# Patient Record
Sex: Male | Born: 1935
Health system: Southern US, Community
[De-identification: ages and names within clinical notes are randomized; demographics above are authoritative.]

## PROBLEM LIST (undated history)

## (undated) DIAGNOSIS — R739 Hyperglycemia, unspecified: Secondary | ICD-10-CM

## (undated) DIAGNOSIS — I10 Essential (primary) hypertension: Secondary | ICD-10-CM

## (undated) DIAGNOSIS — C169 Malignant neoplasm of stomach, unspecified: Secondary | ICD-10-CM

## (undated) HISTORY — DX: Essential (primary) hypertension: I10

## (undated) HISTORY — DX: Malignant neoplasm of stomach, unspecified: C16.9

## (undated) HISTORY — DX: Hyperglycemia, unspecified: R73.9

---

## 2011-12-21 ENCOUNTER — Ambulatory Visit (INDEPENDENT_AMBULATORY_CARE_PROVIDER_SITE_OTHER): Payer: Medicare Other | Admitting: Internal Medicine

## 2011-12-21 ENCOUNTER — Encounter: Payer: Self-pay | Admitting: Internal Medicine

## 2011-12-21 DIAGNOSIS — E785 Hyperlipidemia, unspecified: Secondary | ICD-10-CM

## 2011-12-21 DIAGNOSIS — I10 Essential (primary) hypertension: Secondary | ICD-10-CM

## 2011-12-21 DIAGNOSIS — R011 Cardiac murmur, unspecified: Secondary | ICD-10-CM | POA: Insufficient documentation

## 2011-12-21 DIAGNOSIS — Z23 Encounter for immunization: Secondary | ICD-10-CM

## 2011-12-21 DIAGNOSIS — I499 Cardiac arrhythmia, unspecified: Secondary | ICD-10-CM

## 2011-12-21 DIAGNOSIS — Z79899 Other long term (current) drug therapy: Secondary | ICD-10-CM

## 2011-12-21 HISTORY — PX: NO PAST SURGERIES: SHX2092

## 2011-12-21 LAB — LIPID PANEL
HDL: 37 mg/dL — ABNORMAL LOW (ref >39)
LDL Cholesterol: 129 mg/dL — ABNORMAL HIGH (ref 0–99)
Total CHOL/HDL Ratio: 5 Ratio
Triglycerides: 100 mg/dL (ref <150)

## 2011-12-21 LAB — BASIC METABOLIC PANEL
CO2: 25 mEq/L (ref 19–32)
Chloride: 104 mEq/L (ref 96–112)
Creat: 1.11 mg/dL (ref 0.50–1.35)
Sodium: 140 mEq/L (ref 135–145)

## 2011-12-21 LAB — CBC
Platelets: 218 10*3/uL (ref 150–400)
RBC: 4.24 MIL/uL (ref 4.22–5.81)
RDW: 13.5 % (ref 11.5–15.5)
WBC: 5.4 10*3/uL (ref 4.0–10.5)

## 2011-12-21 LAB — HEPATIC FUNCTION PANEL
AST: 18 U/L (ref 0–37)
Albumin: 4.1 g/dL (ref 3.5–5.2)
Alkaline Phosphatase: 77 U/L (ref 39–117)
Total Bilirubin: 0.5 mg/dL (ref 0.3–1.2)
Total Protein: 6.8 g/dL (ref 6.0–8.3)

## 2011-12-21 NOTE — Progress Notes (Signed)
  Subjective:    Patient ID: Lee Pratt, male    DOB: 10-Jul-1934, 77 y.o.   MRN: [id number]  HPI Pt presents to clinic to establish care and for follow up of multiple medical problems. Notes h/o HTN well controlled with diltiazem without bradycardia and ace inhibitor without chronic cough or angioedema. Has been told in the past has mild hyperlipidemia however has not required medication. Recalls reportedly nl colonoscopy ~?2011 in [place]. Shows me a handwritten note from a local physician suggesting evaluation for "arrythmia." denies palpitations, chest pain or known h/o arrythmia. Denies having had recent EKG. Has minimal intermittent cough/throat irritation for the last 1-2 wks. Does not feel sick. Feels it is more related to air from airvent blowing against his face. No other complaints.  Past Medical History  Diagnosis Date  . Hypertension    Past Surgical History  Procedure Date  . No past surgeries 12/21/2011    reports that he has never smoked. He has never used smokeless tobacco. He reports that he does not drink alcohol or use illicit drugs. family history is not on file. No Known Allergies   Review of Systems  Constitutional: Negative for fever.  HENT: Positive for congestion.   Respiratory: Positive for cough.   Cardiovascular: Negative for chest pain and palpitations.  Neurological: Negative for dizziness, syncope and light-headedness.  All other systems reviewed and are negative.       Objective:   Physical Exam  Nursing note and vitals reviewed. Constitutional: He is oriented to person, place, and time. He appears well-developed and well-nourished. No distress.  HENT:  Head: Normocephalic and atraumatic.  Right Ear: Tympanic membrane, external ear and ear canal normal.  Left Ear: Tympanic membrane, external ear and ear canal normal.  Nose: Nose normal.  Mouth/Throat: Oropharynx is clear and moist. No oropharyngeal exudate.  Eyes: Conjunctivae are normal. Pupils are  equal, round, and reactive to light. Right eye exhibits no discharge. Left eye exhibits no discharge. No scleral icterus.  Neck: Neck supple. No JVD present. Carotid bruit is not present. No thyromegaly present.  Cardiovascular: Normal rate and regular rhythm.  Exam reveals no gallop and no friction rub.   Murmur heard.  Systolic murmur is present with a grade of 3/6  Pulmonary/Chest: Effort normal and breath sounds normal. No respiratory distress. He has no wheezes. He has no rales.  Neurological: He is alert and oriented to person, place, and time.  Skin: Skin is warm and dry. He is not diaphoretic.  Psychiatric: He has a normal mood and affect.          Assessment & Plan:

## 2011-12-21 NOTE — Assessment & Plan Note (Signed)
Asx. EKG obtained demonstrated SR ~69 with interspersed PVC's. No VT. Echocardiogram as above

## 2011-12-21 NOTE — Assessment & Plan Note (Signed)
Asx. Schedule echocardiogram

## 2011-12-21 NOTE — Patient Instructions (Signed)
Please schedule echocardiogram to evaluate your heart

## 2011-12-21 NOTE — Assessment & Plan Note (Signed)
Reportedly mild. Obtain lipid/lft

## 2011-12-27 ENCOUNTER — Ambulatory Visit (HOSPITAL_COMMUNITY): Payer: Medicare Other | Attending: Internal Medicine

## 2011-12-27 DIAGNOSIS — I10 Essential (primary) hypertension: Secondary | ICD-10-CM | POA: Insufficient documentation

## 2011-12-27 DIAGNOSIS — I379 Nonrheumatic pulmonary valve disorder, unspecified: Secondary | ICD-10-CM | POA: Insufficient documentation

## 2011-12-27 DIAGNOSIS — I359 Nonrheumatic aortic valve disorder, unspecified: Secondary | ICD-10-CM | POA: Insufficient documentation

## 2011-12-27 DIAGNOSIS — E785 Hyperlipidemia, unspecified: Secondary | ICD-10-CM | POA: Insufficient documentation

## 2011-12-27 DIAGNOSIS — I059 Rheumatic mitral valve disease, unspecified: Secondary | ICD-10-CM | POA: Insufficient documentation

## 2011-12-27 DIAGNOSIS — R011 Cardiac murmur, unspecified: Secondary | ICD-10-CM | POA: Insufficient documentation

## 2011-12-27 DIAGNOSIS — I079 Rheumatic tricuspid valve disease, unspecified: Secondary | ICD-10-CM | POA: Insufficient documentation

## 2012-05-19 ENCOUNTER — Ambulatory Visit (HOSPITAL_BASED_OUTPATIENT_CLINIC_OR_DEPARTMENT_OTHER)
Admission: RE | Admit: 2012-05-19 | Discharge: 2012-05-19 | Disposition: A | Discharge status: Home / Self Care | Payer: Medicare Other | Source: Ambulatory Visit | Attending: Internal Medicine | Admitting: Internal Medicine

## 2012-05-19 ENCOUNTER — Ambulatory Visit (INDEPENDENT_AMBULATORY_CARE_PROVIDER_SITE_OTHER): Payer: Medicare Other | Admitting: Internal Medicine

## 2012-05-19 ENCOUNTER — Encounter: Payer: Self-pay | Admitting: Internal Medicine

## 2012-05-19 VITALS — BP 118/70 | HR 64 | Temp 97.6°F | Resp 18 | Ht 69.0 in | Wt 140.0 lb

## 2012-05-19 DIAGNOSIS — I10 Essential (primary) hypertension: Secondary | ICD-10-CM

## 2012-05-19 DIAGNOSIS — R05 Cough: Secondary | ICD-10-CM | POA: Insufficient documentation

## 2012-05-19 DIAGNOSIS — E785 Hyperlipidemia, unspecified: Secondary | ICD-10-CM

## 2012-05-19 DIAGNOSIS — Z79899 Other long term (current) drug therapy: Secondary | ICD-10-CM

## 2012-05-19 DIAGNOSIS — J984 Other disorders of lung: Secondary | ICD-10-CM | POA: Insufficient documentation

## 2012-05-19 DIAGNOSIS — J8410 Pulmonary fibrosis, unspecified: Secondary | ICD-10-CM | POA: Insufficient documentation

## 2012-05-19 LAB — HEPATIC FUNCTION PANEL
ALT: 17 U/L (ref 0–53)
Albumin: 4.1 g/dL (ref 3.5–5.2)
Alkaline Phosphatase: 77 U/L (ref 39–117)
Total Protein: 7.1 g/dL (ref 6.0–8.3)

## 2012-05-19 LAB — LIPID PANEL
Cholesterol: 188 mg/dL (ref 0–200)
HDL: 41 mg/dL (ref >39)
LDL Cholesterol: 133 mg/dL — ABNORMAL HIGH (ref 0–99)
Triglycerides: 71 mg/dL (ref <150)
VLDL: 14 mg/dL (ref 0–40)

## 2012-05-19 LAB — BASIC METABOLIC PANEL
Chloride: 105 mEq/L (ref 96–112)
Potassium: 3.9 mEq/L (ref 3.5–5.3)
Sodium: 140 mEq/L (ref 135–145)

## 2012-05-19 MED ORDER — LOSARTAN POTASSIUM 25 MG PO TABS: 25.0000 mg | ORAL_TABLET | Freq: Every day | ORAL | Status: DC

## 2012-05-19 NOTE — Progress Notes (Signed)
  Subjective:    Patient ID: Lee Pratt, male    DOB: Nov 17, 1934, 78 y.o.   MRN: [id number]  HPI Pt presents to clinic for followup of multiple medical problems. BP well controlled. Notes 2+ month h/o NP cough without dyspnea or wheezing. No alleviating or exacerbating factors. Requests prescription for home bp monitor. H/o hyperlipidemia not requiring medication.  Past Medical History  Diagnosis Date  . Hypertension    Past Surgical History  Procedure Date  . No past surgeries 12/21/2011    reports that he has never smoked. He has never used smokeless tobacco. He reports that he does not drink alcohol or use illicit drugs. family history is not on file. No Known Allergies    Review of Systems see hpi     Objective:   Physical Exam  Physical Exam  Nursing note and vitals reviewed. Constitutional: Appears well-developed and well-nourished. No distress.  HENT:  Head: Normocephalic and atraumatic.  Right Ear: External ear normal.  Left Ear: External ear normal.  Eyes: Conjunctivae are normal. No scleral icterus.  Neck: Neck supple. Carotid bruit is not present.  Cardiovascular: Normal rate, regular rhythm and normal heart sounds.  Exam reveals no gallop and no friction rub.   3/6 sm Pulmonary/Chest: Effort normal and breath sounds normal. No respiratory distress. He has no wheezes. no rales.  Lymphadenopathy:    He has no cervical adenopathy.  Neurological:Alert.  Skin: Skin is warm and dry. Not diaphoretic.  Psychiatric: Has a normal mood and affect.        Assessment & Plan:

## 2012-05-19 NOTE — Assessment & Plan Note (Signed)
Stop ace inhibitor. Begin losartan. Obtain cxr. Close f/u scheduled.

## 2012-05-19 NOTE — Assessment & Plan Note (Signed)
Normotensive control. ?ace inhib induced cough. Change to arb. Home bp monitor prescription provided. Obtain chem7.

## 2012-05-19 NOTE — Assessment & Plan Note (Signed)
Obtain lipid/lft. 

## 2012-05-19 NOTE — Patient Instructions (Signed)
Please stop taking lisinopril as it may be causing your cough. Begin losartan once a day in it's place. If the cough goes away it may take up to three weeks after the medication change.

## 2012-05-20 ENCOUNTER — Other Ambulatory Visit: Payer: Self-pay | Admitting: Internal Medicine

## 2012-05-20 DIAGNOSIS — R9389 Abnormal findings on diagnostic imaging of other specified body structures: Secondary | ICD-10-CM

## 2012-06-23 ENCOUNTER — Ambulatory Visit (INDEPENDENT_AMBULATORY_CARE_PROVIDER_SITE_OTHER): Payer: Medicare Other | Admitting: Internal Medicine

## 2012-06-23 ENCOUNTER — Telehealth: Payer: Self-pay | Admitting: Internal Medicine

## 2012-06-23 ENCOUNTER — Encounter: Payer: Self-pay | Admitting: Internal Medicine

## 2012-06-23 VITALS — BP 108/70 | HR 60 | Temp 98.0°F | Resp 18 | Ht 69.0 in | Wt 140.0 lb

## 2012-06-23 DIAGNOSIS — R918 Other nonspecific abnormal finding of lung field: Secondary | ICD-10-CM

## 2012-06-23 DIAGNOSIS — I10 Essential (primary) hypertension: Secondary | ICD-10-CM

## 2012-06-23 DIAGNOSIS — R059 Cough, unspecified: Secondary | ICD-10-CM

## 2012-06-23 DIAGNOSIS — R9389 Abnormal findings on diagnostic imaging of other specified body structures: Secondary | ICD-10-CM

## 2012-06-23 DIAGNOSIS — R05 Cough: Secondary | ICD-10-CM

## 2012-06-23 DIAGNOSIS — R053 Chronic cough: Secondary | ICD-10-CM

## 2012-06-23 DIAGNOSIS — E785 Hyperlipidemia, unspecified: Secondary | ICD-10-CM

## 2012-06-23 MED ORDER — FLUTICASONE PROPIONATE 50 MCG/ACT NA SUSP: 2.0000 | Freq: Every day | NASAL | Status: DC

## 2012-06-23 NOTE — Patient Instructions (Addendum)
Please have your follow up chest xray done approximately one week before your next appointment Also please sure fasting lipid check prior to next visit (272.4)

## 2012-06-23 NOTE — Telephone Encounter (Signed)
Lab order entered for October 2013. 

## 2012-06-28 DIAGNOSIS — R9389 Abnormal findings on diagnostic imaging of other specified body structures: Secondary | ICD-10-CM | POA: Insufficient documentation

## 2012-06-28 NOTE — Assessment & Plan Note (Signed)
Improved but suspect contribution from rhinitis. Attempt flonase

## 2012-06-28 NOTE — Progress Notes (Signed)
  Subjective:    Patient ID: Lee Pratt, male    DOB: Nov 07, 1934, 78 y.o.   MRN: [id number]  HPI Pt presents to clinic for followup of multiple medical problems. toleating losartan without side effect. bp reviewed normotensive. Chronic cough persists but states is rare and occurs often with AC exposure. Reviewed cxr with right apical scarring.  Past Medical History  Diagnosis Date  . Hypertension    Past Surgical History  Procedure Date  . No past surgeries 12/21/2011    reports that he has never smoked. He has never used smokeless tobacco. He reports that he does not drink alcohol or use illicit drugs. family history is not on file. No Known Allergies    Review of Systems see hpi     Objective:   Physical Exam  Physical Exam  Nursing note and vitals reviewed. Constitutional: Appears well-developed and well-nourished. No distress.  HENT:  Head: Normocephalic and atraumatic.  Right Ear: External ear normal.  Left Ear: External ear normal.  Eyes: Conjunctivae are normal. No scleral icterus.  Neck: Neck supple. Carotid bruit is not present.  Cardiovascular: Normal rate, regular rhythm and normal heart sounds.  Exam reveals no gallop and no friction rub.   No murmur heard. Pulmonary/Chest: Effort normal and breath sounds normal. No respiratory distress. He has no wheezes. no rales.  Lymphadenopathy:    He has no cervical adenopathy.  Neurological:Alert.  Skin: Skin is warm and dry. Not diaphoretic.  Psychiatric: Has a normal mood and affect.        Assessment & Plan:

## 2012-06-28 NOTE — Assessment & Plan Note (Signed)
Repeat cxr 4-6 months from original date

## 2012-06-28 NOTE — Assessment & Plan Note (Signed)
Normotensive and stable. Continue current regimen. Monitor bp as outpt and followup in clinic as scheduled.  

## 2012-09-25 ENCOUNTER — Other Ambulatory Visit: Payer: Self-pay | Admitting: Internal Medicine

## 2012-09-25 ENCOUNTER — Ambulatory Visit (HOSPITAL_BASED_OUTPATIENT_CLINIC_OR_DEPARTMENT_OTHER)
Admission: RE | Admit: 2012-09-25 | Discharge: 2012-09-25 | Disposition: A | Discharge status: Home / Self Care | Payer: Medicare Other | Source: Ambulatory Visit | Attending: Internal Medicine | Admitting: Internal Medicine

## 2012-09-25 DIAGNOSIS — R9389 Abnormal findings on diagnostic imaging of other specified body structures: Secondary | ICD-10-CM

## 2012-09-25 DIAGNOSIS — J8410 Pulmonary fibrosis, unspecified: Secondary | ICD-10-CM | POA: Insufficient documentation

## 2012-09-25 DIAGNOSIS — J449 Chronic obstructive pulmonary disease, unspecified: Secondary | ICD-10-CM | POA: Insufficient documentation

## 2012-09-25 DIAGNOSIS — J4489 Other specified chronic obstructive pulmonary disease: Secondary | ICD-10-CM | POA: Insufficient documentation

## 2012-10-14 LAB — LIPID PANEL
Cholesterol: 205 mg/dL — ABNORMAL HIGH (ref 0–200)
LDL Cholesterol: 137 mg/dL — ABNORMAL HIGH (ref 0–99)
Total CHOL/HDL Ratio: 4.8 Ratio
VLDL: 25 mg/dL (ref 0–40)

## 2012-10-14 NOTE — Addendum Note (Signed)
Addended by: Regis Bill on: 10/14/2012 10:08 AM   Modules accepted: Orders

## 2012-10-14 NOTE — Telephone Encounter (Signed)
Lab orders released/SLS 

## 2012-10-21 ENCOUNTER — Encounter: Payer: Self-pay | Admitting: Internal Medicine

## 2012-10-21 ENCOUNTER — Ambulatory Visit (INDEPENDENT_AMBULATORY_CARE_PROVIDER_SITE_OTHER): Payer: Medicare Other | Admitting: Internal Medicine

## 2012-10-21 VITALS — BP 102/62 | HR 58 | Temp 97.8°F | Resp 16 | Wt 150.2 lb

## 2012-10-21 DIAGNOSIS — Z23 Encounter for immunization: Secondary | ICD-10-CM

## 2012-10-21 DIAGNOSIS — R21 Rash and other nonspecific skin eruption: Secondary | ICD-10-CM

## 2012-10-21 DIAGNOSIS — E785 Hyperlipidemia, unspecified: Secondary | ICD-10-CM

## 2012-10-21 DIAGNOSIS — I10 Essential (primary) hypertension: Secondary | ICD-10-CM

## 2012-10-21 NOTE — Assessment & Plan Note (Signed)
Normotensive and stable. Continue current regimen. Monitor bp as outpt and followup in clinic as scheduled. Obtain CBC and Chem-7 prior to next visit 

## 2012-10-21 NOTE — Progress Notes (Signed)
  Subjective:    Patient ID: Lee Pratt, male    DOB: [birth year], 78 y.o.   MRN: [id number]  HPI Pt presents to clinic for followup of multiple medical problems. Notes approximately one week history of anterior lower neck rash. No spread. No obvious trigger. Has been applying lotion. Has history of mild hyperlipidemia and repeat lipid panel reviewed with patient with mild persistent elevations. Exercises on a regular basis. No other complaints  Past Medical History  Diagnosis Date  . Hypertension    Past Surgical History  Procedure Date  . No past surgeries 12/21/2011    reports that he has never smoked. He has never used smokeless tobacco. He reports that he does not drink alcohol or use illicit drugs. family history is not on file. No Known Allergies    Review of Systems see hpi     Objective:   Physical Exam  Physical Exam  Nursing note and vitals reviewed. Constitutional: Appears well-developed and well-nourished. No distress.  HENT:  Head: Normocephalic and atraumatic.  Right Ear: External ear normal.  Left Ear: External ear normal.  Eyes: Conjunctivae are normal. No scleral icterus.  Neck: Neck supple. Carotid bruit is not present.  Cardiovascular: Normal rate, regular rhythm and normal heart sounds.  Exam reveals no gallop and no friction rub.   No murmur heard. Pulmonary/Chest: Effort normal and breath sounds normal. No respiratory distress. He has no wheezes. no rales.  Lymphadenopathy:    He has no cervical adenopathy.  Neurological:Alert.  Skin: Skin is warm and dry. Not diaphoretic.  bilateral lower anterior neck with mild scattered erythematous papules.  Psychiatric: Has a normal mood and affect.        Assessment & Plan:

## 2012-10-21 NOTE — Addendum Note (Signed)
Addended by: Regis Bill on: 10/21/2012 09:20 AM   Modules accepted: Orders

## 2012-10-21 NOTE — Assessment & Plan Note (Signed)
Appearance consistent with possible dermatitis. Unknown trigger. Recommend triamcinolone and patient defers stating he has what he believes to be a steroid cream at home which he will try first. Followup if no improvement or worsening.

## 2012-10-21 NOTE — Patient Instructions (Signed)
Please schedule fasting labs prior to next visit Cbc-401.9, chem7-v58.69, lipid/lft-272.4

## 2012-10-21 NOTE — Assessment & Plan Note (Signed)
Mild elevations. Continue exercise and low-fat diet. Attempt over-the-counter red yeast rice. Repeat lipid panel with next visit

## 2012-11-06 ENCOUNTER — Ambulatory Visit (INDEPENDENT_AMBULATORY_CARE_PROVIDER_SITE_OTHER): Payer: Medicare Other | Admitting: Internal Medicine

## 2012-11-06 ENCOUNTER — Encounter: Payer: Self-pay | Admitting: Internal Medicine

## 2012-11-06 VITALS — BP 130/74 | HR 66 | Temp 98.1°F | Resp 16 | Ht 69.0 in | Wt 148.0 lb

## 2012-11-06 DIAGNOSIS — R21 Rash and other nonspecific skin eruption: Secondary | ICD-10-CM

## 2012-11-06 MED ORDER — METHYLPREDNISOLONE ACETATE 40 MG/ML IJ SUSP
20.0000 mg | Freq: Once | INTRAMUSCULAR | Status: AC
  Administered 2012-11-06: 20 mg via INTRAMUSCULAR

## 2012-11-06 MED ORDER — METHYLPREDNISOLONE 4 MG PO KIT: PACK | ORAL | Status: DC

## 2012-11-06 NOTE — Assessment & Plan Note (Signed)
Clinically appears consistent with dermatitis. Unknown trigger. Stop Silver sulfa cream. Given Depo-Medrol injection today and begin Medrol Dosepak. Recommend dermatology consult if rash is refractory to treatment

## 2012-11-06 NOTE — Progress Notes (Signed)
  Subjective:    Patient ID: Lee Pratt, male    DOB: 1934-05-10, 78 y.o.   MRN: [id number]  HPI patient presents to clinic for reevaluation of rash. Last month evaluated for what was felt to be dermatitis involving the neck. Obvious trigger was noted. Attempted to prescribe triamcinolone the patient stated he had steroid cream at home he would try. Presents today with rash increasing intensity and enlarging in size but still predominantly involves the anterior and lateral neck. No oral or ocular involvement. It is mildly does not hurt. Cream he attempted home was actually silver sulfa-based. No other alleviating or exacerbating factors.  Past Medical History  Diagnosis Date  . Hypertension    Past Surgical History  Procedure Date  . No past surgeries 12/21/2011    reports that he has never smoked. He has never used smokeless tobacco. He reports that he does not drink alcohol or use illicit drugs. family history is not on file. No Known Allergies   Review of Systems see history of present illness     Objective:   Physical Exam  Nursing note and vitals reviewed. Constitutional: He appears well-developed and well-nourished. No distress.  HENT:  Head: Normocephalic and atraumatic.  Right Ear: External ear normal.  Left Ear: External ear normal.  Eyes: Conjunctivae normal are normal. No scleral icterus.  Skin: Skin is warm and dry. Rash noted. He is not diaphoretic. There is erythema. No pallor.       Erythematous papular rash involving anterior and bilateral neck. No oral or ocular involvement. No drainage. There is no dermatomal distribution.          Assessment & Plan:

## 2012-11-18 ENCOUNTER — Other Ambulatory Visit: Payer: Self-pay | Admitting: Internal Medicine

## 2012-11-18 MED ORDER — METHYLPREDNISOLONE 4 MG PO KIT: PACK | ORAL | Status: DC

## 2012-12-04 ENCOUNTER — Encounter (HOSPITAL_COMMUNITY): Payer: Self-pay

## 2012-12-04 ENCOUNTER — Emergency Department (HOSPITAL_COMMUNITY)
Admission: EM | Admit: 2012-12-04 | Discharge: 2012-12-04 | Disposition: A | Discharge status: Home / Self Care | Payer: Medicare Other | Attending: Emergency Medicine | Admitting: Emergency Medicine

## 2012-12-04 DIAGNOSIS — R040 Epistaxis: Secondary | ICD-10-CM

## 2012-12-04 DIAGNOSIS — Z79899 Other long term (current) drug therapy: Secondary | ICD-10-CM | POA: Insufficient documentation

## 2012-12-04 DIAGNOSIS — I10 Essential (primary) hypertension: Secondary | ICD-10-CM | POA: Insufficient documentation

## 2012-12-04 LAB — PROTIME-INR
INR: 0.95 (ref 0.00–1.49)
Prothrombin Time: 12.6 seconds (ref 11.6–15.2)

## 2012-12-04 LAB — BASIC METABOLIC PANEL
CO2: 25 mEq/L (ref 19–32)
Calcium: 8.9 mg/dL (ref 8.4–10.5)
Potassium: 3.9 mEq/L (ref 3.5–5.1)
Sodium: 137 mEq/L (ref 135–145)

## 2012-12-04 LAB — CBC
Hemoglobin: 12.8 g/dL — ABNORMAL LOW (ref 13.0–17.0)
MCH: 31.9 pg (ref 26.0–34.0)
Platelets: 217 10*3/uL (ref 150–400)
RBC: 4.01 MIL/uL — ABNORMAL LOW (ref 4.22–5.81)
WBC: 5.7 10*3/uL (ref 4.0–10.5)

## 2012-12-04 LAB — APTT: aPTT: 30 seconds (ref 24–37)

## 2012-12-04 MED ORDER — OXYMETAZOLINE HCL 0.05 % NA SOLN
3.0000 | Freq: Once | NASAL | Status: AC
  Administered 2012-12-04: 3 via NASAL
  Filled 2012-12-04: qty 15

## 2012-12-04 NOTE — ED Notes (Signed)
ZOX:[id number]<AV> Expected date:12/04/12<BR> Expected time:10:06 AM<BR> Means of arrival:Ambulance<BR> Comments:<BR> nosebleed

## 2012-12-04 NOTE — ED Notes (Signed)
Per EMS- Patient reports that he had minimal bleeding right nare at 0500 this AM. After applying pressure the bleeding stopped.  Bleeding resumed at 0940. EMS suctioned approx 70cc from patient's mouth. Patient had large amount of blood on his shirt. Patient states he takes an aspirin daily.

## 2012-12-04 NOTE — ED Provider Notes (Signed)
History     CSN: [id number]  Arrival date & time 12/04/12  1027   First MD Initiated Contact with Patient 12/04/12 1033      Chief Complaint  Patient presents with  . Epistaxis    (Consider location/radiation/quality/duration/timing/severity/associated sxs/prior treatment) Patient is a 78 y.o. male presenting with nosebleeds. The history is provided by the patient. No language interpreter was used.  Epistaxis  This is a new problem. The current episode started 6 to 12 hours ago (0500.). The problem has been gradually improving. The problem is associated with aspirin. The bleeding has been from the right nare. He has tried ice and applying pressure for the symptoms. The treatment provided moderate relief. His past medical history does not include bleeding disorder, colds, sinus problems, allergies, nose-picking or frequent nosebleeds.    Past Medical History  Diagnosis Date  . Hypertension     Past Surgical History  Procedure Date  . No past surgeries 12/21/2011    No family history on file.  History  Substance Use Topics  . Smoking status: Never Smoker   . Smokeless tobacco: Never Used  . Alcohol Use: No      Review of Systems  HENT: Positive for nosebleeds.   All other systems reviewed and are negative.    Allergies  Review of patient's allergies indicates no known allergies.  Home Medications   Current Outpatient Rx  Name  Route  Sig  Dispense  Refill  . DILTIAZEM HCL 120 MG PO TABS   Oral   Take 120 mg by mouth daily.          Marland Kitchen FLUTICASONE PROPIONATE 50 MCG/ACT NA SUSP   Nasal   Place 2 sprays into the nose daily.   16 g   6   . LOSARTAN POTASSIUM 25 MG PO TABS   Oral   Take 1 tablet (25 mg total) by mouth daily.   30 tablet   6     Change from lisinopril   . METHYLPREDNISOLONE 4 MG PO KIT      follow package directions   21 tablet   0     SpO2 100%  Physical Exam  Nursing note and vitals reviewed. Constitutional: He is  oriented to person, place, and time. He appears well-developed and well-nourished. No distress.  HENT:  Head: Normocephalic and atraumatic. Head is without raccoon's eyes, without Battle's sign, without abrasion, without right periorbital erythema and without left periorbital erythema. No trismus in the jaw.  Right Ear: External ear normal.  Left Ear: External ear normal.  Nose: No mucosal edema, rhinorrhea, sinus tenderness or nasal deformity. Epistaxis (blood trickling from the right naris) is observed. Right sinus exhibits no maxillary sinus tenderness and no frontal sinus tenderness. Left sinus exhibits no maxillary sinus tenderness and no frontal sinus tenderness.  Mouth/Throat: Uvula is midline and mucous membranes are normal. Mucous membranes are not pale, not dry and not cyanotic. No oral lesions. Normal dentition. No uvula swelling. Lacerations: note some blood in the posterior pharynx. No oropharyngeal exudate, posterior oropharyngeal edema, posterior oropharyngeal erythema or tonsillar abscesses.  Eyes: Conjunctivae normal are normal. Pupils are equal, round, and reactive to light. Right eye exhibits no discharge. Left eye exhibits no discharge. No scleral icterus.  Neck: Normal range of motion. Neck supple. No JVD present. No tracheal deviation present.  Cardiovascular: Normal rate, regular rhythm and intact distal pulses.  Exam reveals friction rub. Exam reveals no gallop.   No murmur heard. Pulmonary/Chest: Effort normal  and breath sounds normal. No stridor. No respiratory distress. He has no wheezes. He has no rales. He exhibits no tenderness.  Abdominal: Soft. Bowel sounds are normal. He exhibits no distension and no mass. There is no tenderness. There is no rebound and no guarding.  Musculoskeletal: Normal range of motion. He exhibits no edema and no tenderness.  Lymphadenopathy:    He has no cervical adenopathy.  Neurological: He is alert and oriented to person, place, and time.   Skin: Skin is warm and dry. No rash noted. He is not diaphoretic. No erythema. No pallor.  Psychiatric: His behavior is normal.    ED Course  Procedures (including critical care time)  Labs Reviewed - No data to display No results found.   No diagnosis found.    MDM  Pt presents for evaluation of a nose bleed.  He has some oozing from the right naris.  He appears nontoxic, note stable VS, NAD.  Will obtain basic labs an coags.  He appears to have experienced a considerable amount of bleeding.  Will clear the nose by having him blow, instill afrin spray in both nostrils, and clamp the nose.  He  Also has an ice pack applied to the bridge of the nose .  Will reassess.  1220.  Bleeding has resolved.  Note essentially normal labs.  Will have him ambulate around the department to see if there is any further bleeding.  If there is, will pack the right naris.  1500.  Pt stable, NAD.  Note unremarkable labs.  Bleeding has resolved.  Plan D/C home.  Discussed indications for return to the ER.  He demonstrates clear understanding.     Tobin Chad, MD 12/04/12 [phone]

## 2012-12-04 NOTE — ED Notes (Signed)
Ambulated in the hallway with no assistance. Patient denied dizziness and no nasal bleeding noted.

## 2012-12-05 ENCOUNTER — Telehealth: Payer: Self-pay | Admitting: *Deleted

## 2012-12-05 NOTE — Telephone Encounter (Signed)
Pt seen in office 11.07.13 for Rash: chest & neck; dermatology recommended if resistant to Tx; pt requesting referral order now/SLS

## 2012-12-06 ENCOUNTER — Other Ambulatory Visit: Payer: Self-pay | Admitting: Internal Medicine

## 2012-12-06 DIAGNOSIS — R21 Rash and other nonspecific skin eruption: Secondary | ICD-10-CM

## 2012-12-18 ENCOUNTER — Ambulatory Visit: Payer: Medicare Other | Admitting: Internal Medicine

## 2012-12-22 ENCOUNTER — Other Ambulatory Visit: Payer: Self-pay | Admitting: *Deleted

## 2012-12-22 MED ORDER — DILTIAZEM HCL 120 MG PO TABS: 120.0000 mg | ORAL_TABLET | Freq: Every day | ORAL | Status: DC

## 2012-12-22 NOTE — Telephone Encounter (Signed)
Rx to pharmacy/SLS 

## 2013-03-02 ENCOUNTER — Telehealth: Payer: Self-pay | Admitting: Internal Medicine

## 2013-03-02 MED ORDER — LOSARTAN POTASSIUM 25 MG PO TABS: 25.0000 mg | ORAL_TABLET | Freq: Every day | ORAL | Status: DC

## 2013-03-02 NOTE — Telephone Encounter (Signed)
Refill- cozaar 25mg  tab. Take one tablet by mouth every day. Qty 30 last fill 1.25.14

## 2013-03-16 ENCOUNTER — Encounter: Payer: Self-pay | Admitting: Family Medicine

## 2013-03-16 ENCOUNTER — Ambulatory Visit (INDEPENDENT_AMBULATORY_CARE_PROVIDER_SITE_OTHER): Payer: Medicare Other | Admitting: Family Medicine

## 2013-03-16 VITALS — BP 140/72 | HR 61 | Temp 98.0°F | Ht 69.0 in | Wt 149.1 lb

## 2013-03-16 DIAGNOSIS — Z23 Encounter for immunization: Secondary | ICD-10-CM

## 2013-03-16 DIAGNOSIS — R05 Cough: Secondary | ICD-10-CM

## 2013-03-16 DIAGNOSIS — R7309 Other abnormal glucose: Secondary | ICD-10-CM

## 2013-03-16 DIAGNOSIS — I10 Essential (primary) hypertension: Secondary | ICD-10-CM

## 2013-03-16 DIAGNOSIS — R21 Rash and other nonspecific skin eruption: Secondary | ICD-10-CM

## 2013-03-16 DIAGNOSIS — R739 Hyperglycemia, unspecified: Secondary | ICD-10-CM

## 2013-03-16 DIAGNOSIS — E785 Hyperlipidemia, unspecified: Secondary | ICD-10-CM

## 2013-03-16 HISTORY — DX: Hyperglycemia, unspecified: R73.9

## 2013-03-16 NOTE — Assessment & Plan Note (Signed)
Mild, avoid trans fats, minimize simple carbs and saturated fats. 

## 2013-03-16 NOTE — Assessment & Plan Note (Addendum)
Better per patient, given Tdap today

## 2013-03-16 NOTE — Patient Instructions (Addendum)
Labs prior to visit, lipid, renal, hgba1c, hepatic, cbc, tsh  Make visit annual exam  Hypertension As your heart beats, it forces blood through your arteries. This force is your blood pressure. If the pressure is too high, it is called hypertension (HTN) or high blood pressure. HTN is dangerous because you may have it and not know it. High blood pressure may mean that your heart has to work harder to pump blood. Your arteries may be narrow or stiff. The extra work puts you at risk for heart disease, stroke, and other problems.  Blood pressure consists of two numbers, a higher number over a lower, 110/72, for example. It is stated as "110 over 72." The ideal is below 120 for the top number (systolic) and under 80 for the bottom (diastolic). Write down your blood pressure today. You should pay close attention to your blood pressure if you have certain conditions such as:  Heart failure.  Prior heart attack.  Diabetes  Chronic kidney disease.  Prior stroke.  Multiple risk factors for heart disease. To see if you have HTN, your blood pressure should be measured while you are seated with your arm held at the level of the heart. It should be measured at least twice. A one-time elevated blood pressure reading (especially in the Emergency Department) does not mean that you need treatment. There may be conditions in which the blood pressure is different between your right and left arms. It is important to see your caregiver soon for a recheck. Most people have essential hypertension which means that there is not a specific cause. This type of high blood pressure may be lowered by changing lifestyle factors such as:  Stress.  Smoking.  Lack of exercise.  Excessive weight.  Drug/tobacco/alcohol use.  Eating less salt. Most people do not have symptoms from high blood pressure until it has caused damage to the body. Effective treatment can often prevent, delay or reduce that damage. TREATMENT    When a cause has been identified, treatment for high blood pressure is directed at the cause. There are a large number of medications to treat HTN. These fall into several categories, and your caregiver will help you select the medicines that are best for you. Medications may have side effects. You should review side effects with your caregiver. If your blood pressure stays high after you have made lifestyle changes or started on medicines,   Your medication(s) may need to be changed.  Other problems may need to be addressed.  Be certain you understand your prescriptions, and know how and when to take your medicine.  Be sure to follow up with your caregiver within the time frame advised (usually within two weeks) to have your blood pressure rechecked and to review your medications.  If you are taking more than one medicine to lower your blood pressure, make sure you know how and at what times they should be taken. Taking two medicines at the same time can result in blood pressure that is too low. SEEK IMMEDIATE MEDICAL CARE IF:  You develop a severe headache, blurred or changing vision, or confusion.  You have unusual weakness or numbness, or a faint feeling.  You have severe chest or abdominal pain, vomiting, or breathing problems. MAKE SURE YOU:   Understand these instructions.  Will watch your condition.  Will get help right away if you are not doing well or get worse. Document Released: 12/17/2005 Document Revised: 03/10/2012 Document Reviewed: 08/06/2008 [facility] Patient Information 2013 [employer]  

## 2013-03-16 NOTE — Addendum Note (Signed)
Addended by: Court Joy on: 03/16/2013 02:00 PM   Modules accepted: Orders

## 2013-03-16 NOTE — Progress Notes (Signed)
Patient ID: Lee Pratt, male   DOB: 06-10-1934, 79 y.o.   MRN: [id number] Lee Pratt [id number] 01-04-1934 03/16/2013      Progress Note-Follow Up  Subjective  Chief Complaint  Chief Complaint  Patient presents with  . Follow-up    5 month    HPI   patient is a 79 year old Caucasian male who is in today for followup. He reports feeling well. He denies any recent illness. He says his cough is improved. He has no chest pain, palpitations, fevers, congestion, headache, GI or GU concerns at today's visit  Past Medical History  Diagnosis Date  . Hypertension   . Hyperglycemia 03/16/2013    Past Surgical History  Procedure Laterality Date  . No past surgeries  12/21/2011    No family history on file.  History   Social History  . Marital Status: Married    Spouse Name: N/A    Number of Children: N/A  . Years of Education: N/A   Occupational History  . Not on file.   Social History Main Topics  . Smoking status: Never Smoker   . Smokeless tobacco: Never Used  . Alcohol Use: No  . Drug Use: No  . Sexually Active: Not on file   Other Topics Concern  . Not on file   Social History Narrative  . No narrative on file    Current Outpatient Prescriptions on File Prior to Visit  Medication Sig Dispense Refill  . aspirin EC 81 MG tablet Take 81 mg by mouth daily.      Marland Kitchen diltiazem (CARDIZEM) 120 MG tablet Take 1 tablet (120 mg total) by mouth daily.  30 tablet  5  . losartan (COZAAR) 25 MG tablet Take 1 tablet (25 mg total) by mouth daily.  30 tablet  6   No current facility-administered medications on file prior to visit.    No Known Allergies  Review of Systems  Review of Systems  Constitutional: Negative for fever and malaise/fatigue.  HENT: Negative for congestion.   Eyes: Negative for discharge.  Respiratory: Negative for shortness of breath.   Cardiovascular: Negative for chest pain, palpitations and leg swelling.  Gastrointestinal: Negative for nausea, abdominal  pain and diarrhea.  Genitourinary: Negative for dysuria.  Musculoskeletal: Negative for falls.  Skin: Negative for rash.  Neurological: Negative for loss of consciousness and headaches.  Endo/Heme/Allergies: Negative for polydipsia.  Psychiatric/Behavioral: Negative for depression and suicidal ideas. The patient is not nervous/anxious and does not have insomnia.      Objective  BP 140/72  Pulse 61  Temp(Src) 98 F (36.7 C) (Oral)  Ht 5\' 9"  (1.753 m)  Wt 149 lb 1.9 oz (67.64 kg)  BMI 22.01 kg/m2  SpO2 98%  Physical Exam  Physical Exam  Constitutional: He is oriented to person, place, and time and well-developed, well-nourished, and in no distress. No distress.  HENT:  Head: Normocephalic and atraumatic.  Eyes: Conjunctivae are normal.  Neck: Neck supple. No thyromegaly present.  Cardiovascular: Normal rate, regular rhythm and normal heart sounds.   Pulmonary/Chest: Effort normal and breath sounds normal. No respiratory distress.  Abdominal: He exhibits no distension and no mass. There is no tenderness.  Musculoskeletal: He exhibits no edema.  Neurological: He is alert and oriented to person, place, and time.  Skin: Skin is warm.  Psychiatric: Memory, affect and judgment normal.    No results found for this basename: TSH   Lab Results  Component Value Date   WBC 5.7 12/04/2012  HGB 12.8* 12/04/2012   HCT 36.4* 12/04/2012   MCV 90.8 12/04/2012   PLT 217 12/04/2012   Lab Results  Component Value Date   CREATININE 0.99 12/04/2012   BUN 19 12/04/2012   NA 137 12/04/2012   K 3.9 12/04/2012   CL 103 12/04/2012   CO2 25 12/04/2012   Lab Results  Component Value Date   ALT 17 05/19/2012   AST 23 05/19/2012   ALKPHOS 77 05/19/2012   BILITOT 0.6 05/19/2012   Lab Results  Component Value Date   CHOL 205* 10/14/2012   Lab Results  Component Value Date   HDL 43 10/14/2012   Lab Results  Component Value Date   LDLCALC 137* 10/14/2012   Lab Results  Component Value Date    TRIG 123 10/14/2012   Lab Results  Component Value Date   CHOLHDL 4.8 10/14/2012     Assessment & Plan  Chronic cough Better per patient, given Tdap today  Hyperlipidemia Mild, avoid trans fats, minimize simple carbs and saturated fats  Hyperglycemia Mild, recheck with next blood draw minimize simple carbs  Rash Well controlled at today's visit  HTN (hypertension) Well controlled today

## 2013-03-16 NOTE — Assessment & Plan Note (Signed)
Well controlled today.

## 2013-03-16 NOTE — Assessment & Plan Note (Signed)
Mild, recheck with next blood draw minimize simple carbs

## 2013-03-16 NOTE — Assessment & Plan Note (Signed)
Well controlled at today's visit. 

## 2013-06-19 ENCOUNTER — Other Ambulatory Visit: Payer: Self-pay | Admitting: Internal Medicine

## 2013-06-19 NOTE — Telephone Encounter (Signed)
Rx request to pharmacy/[facility]  

## 2013-06-22 ENCOUNTER — Other Ambulatory Visit: Payer: Self-pay

## 2013-06-22 MED ORDER — DILTIAZEM HCL 120 MG PO TABS: 120.0000 mg | ORAL_TABLET | Freq: Every day | ORAL | Status: DC

## 2013-06-22 NOTE — Telephone Encounter (Signed)
Pt would like 90 day supply of Diltiazem to pharmacy

## 2013-07-13 ENCOUNTER — Ambulatory Visit: Payer: Medicare Other | Admitting: Family Medicine

## 2013-07-20 ENCOUNTER — Telehealth: Payer: Self-pay

## 2013-07-20 DIAGNOSIS — R739 Hyperglycemia, unspecified: Secondary | ICD-10-CM

## 2013-07-20 DIAGNOSIS — E785 Hyperlipidemia, unspecified: Secondary | ICD-10-CM

## 2013-07-20 DIAGNOSIS — I10 Essential (primary) hypertension: Secondary | ICD-10-CM

## 2013-07-20 DIAGNOSIS — Z0000 Encounter for general adult medical examination without abnormal findings: Secondary | ICD-10-CM

## 2013-07-20 LAB — CBC
HCT: 41.9 % (ref 39.0–52.0)
Hemoglobin: 14.1 g/dL (ref 13.0–17.0)
MCH: 30.6 pg (ref 26.0–34.0)
MCV: 90.9 fL (ref 78.0–100.0)
RBC: 4.61 MIL/uL (ref 4.22–5.81)

## 2013-07-20 NOTE — Telephone Encounter (Signed)
Lab order placed.

## 2013-07-21 LAB — HEMOGLOBIN A1C
Hgb A1c MFr Bld: 5.6 % (ref <5.7)
Mean Plasma Glucose: 114 mg/dL (ref <117)

## 2013-07-21 LAB — RENAL FUNCTION PANEL
CO2: 26 mEq/L (ref 19–32)
Chloride: 105 mEq/L (ref 96–112)
Glucose, Bld: 92 mg/dL (ref 70–99)
Sodium: 138 mEq/L (ref 135–145)

## 2013-07-21 LAB — LIPID PANEL
LDL Cholesterol: 152 mg/dL — ABNORMAL HIGH (ref 0–99)
Triglycerides: 124 mg/dL (ref <150)

## 2013-07-21 LAB — HEPATIC FUNCTION PANEL
Bilirubin, Direct: 0.1 mg/dL (ref 0.0–0.3)
Indirect Bilirubin: 0.5 mg/dL (ref 0.0–0.9)

## 2013-07-28 ENCOUNTER — Encounter: Payer: Self-pay | Admitting: Family Medicine

## 2013-07-28 ENCOUNTER — Ambulatory Visit (INDEPENDENT_AMBULATORY_CARE_PROVIDER_SITE_OTHER): Payer: Medicare Other | Admitting: Family Medicine

## 2013-07-28 VITALS — BP 116/68 | HR 58 | Temp 98.5°F | Ht 69.0 in | Wt 147.1 lb

## 2013-07-28 DIAGNOSIS — R7309 Other abnormal glucose: Secondary | ICD-10-CM

## 2013-07-28 DIAGNOSIS — E785 Hyperlipidemia, unspecified: Secondary | ICD-10-CM

## 2013-07-28 DIAGNOSIS — R739 Hyperglycemia, unspecified: Secondary | ICD-10-CM

## 2013-07-28 DIAGNOSIS — I10 Essential (primary) hypertension: Secondary | ICD-10-CM

## 2013-07-28 NOTE — Progress Notes (Signed)
Patient ID: Lee Pratt, male   DOB: [birth year], 79 y.o.   MRN: [id number] Lee Pratt [id number] 19-Sep-1934 07/28/2013      Progress Note-Follow Up  Subjective  Chief Complaint  Chief Complaint  Patient presents with  . Follow-up    HPI  Patient is a 79 year old Asian male who is in today for followup. He offers no acute complaints. He denies any recent illness, fevers, congestion, chest pain, palpitations, shortness of breath GI or GU concerns. Unfortunately yesterday his wife fell and suffered a fracture of her right hip. It is nonsurgical but he is taking her to an orthopedic appointment for further evaluation. He is taking medications as prescribed and offers no concerns  Past Medical History  Diagnosis Date  . Hypertension   . Hyperglycemia 03/16/2013    Past Surgical History  Procedure Laterality Date  . No past surgeries  12/21/2011    History reviewed. No pertinent family history.  History   Social History  . Marital Status: Married    Spouse Name: N/A    Number of Children: N/A  . Years of Education: N/A   Occupational History  . Not on file.   Social History Main Topics  . Smoking status: Never Smoker   . Smokeless tobacco: Never Used  . Alcohol Use: No  . Drug Use: No  . Sexually Active: Not on file   Other Topics Concern  . Not on file   Social History Narrative  . No narrative on file    Current Outpatient Prescriptions on File Prior to Visit  Medication Sig Dispense Refill  . aspirin EC 81 MG tablet Take 81 mg by mouth daily.      Marland Kitchen diltiazem (CARDIZEM) 120 MG tablet Take 1 tablet (120 mg total) by mouth daily.  90 tablet  1  . losartan (COZAAR) 25 MG tablet Take 1 tablet (25 mg total) by mouth daily.  30 tablet  6   No current facility-administered medications on file prior to visit.    No Known Allergies  Review of Systems  Review of Systems  Constitutional: Negative for fever and malaise/fatigue.  HENT: Negative for congestion.   Eyes:  Negative for pain and discharge.  Respiratory: Negative for shortness of breath.   Cardiovascular: Negative for chest pain, palpitations and leg swelling.  Gastrointestinal: Negative for nausea, abdominal pain and diarrhea.  Genitourinary: Negative for dysuria.  Musculoskeletal: Negative for falls.  Skin: Negative for rash.  Neurological: Negative for loss of consciousness and headaches.  Endo/Heme/Allergies: Negative for polydipsia.  Psychiatric/Behavioral: Negative for depression and suicidal ideas. The patient is not nervous/anxious and does not have insomnia.     Objective  BP 116/68  Pulse 58  Temp(Src) 98.5 F (36.9 C) (Oral)  Ht 5\' 9"  (1.753 m)  Wt 147 lb 1.3 oz (66.715 kg)  BMI 21.71 kg/m2  SpO2 96%  Physical Exam  Physical Exam  Constitutional: He is oriented to person, place, and time and well-developed, well-nourished, and in no distress. No distress.  HENT:  Head: Normocephalic and atraumatic.  Eyes: Conjunctivae are normal.  Neck: Neck supple. No thyromegaly present.  Cardiovascular: Normal rate, regular rhythm and normal heart sounds.  Exam reveals no gallop.   No murmur heard. Pulmonary/Chest: Effort normal and breath sounds normal. No respiratory distress.  Abdominal: He exhibits no distension and no mass. There is no tenderness.  Musculoskeletal: He exhibits no edema.  Neurological: He is alert and oriented to person, place, and time.  Skin: Skin is  warm.  Psychiatric: Memory, affect and judgment normal.    Lab Results  Component Value Date   TSH 1.088 07/20/2013   Lab Results  Component Value Date   WBC 5.1 07/20/2013   HGB 14.1 07/20/2013   HCT 41.9 07/20/2013   MCV 90.9 07/20/2013   PLT 242 07/20/2013   Lab Results  Component Value Date   CREATININE 0.99 07/20/2013   BUN 19 07/20/2013   NA 138 07/20/2013   K 4.3 07/20/2013   CL 105 07/20/2013   CO2 26 07/20/2013   Lab Results  Component Value Date   ALT 12 07/20/2013   AST 16 07/20/2013    ALKPHOS 74 07/20/2013   BILITOT 0.6 07/20/2013   Lab Results  Component Value Date   CHOL 219* 07/20/2013   Lab Results  Component Value Date   HDL 42 07/20/2013   Lab Results  Component Value Date   LDLCALC 152* 07/20/2013   Lab Results  Component Value Date   TRIG 124 07/20/2013   Lab Results  Component Value Date   CHOLHDL 5.2 07/20/2013     Assessment & Plan  HTN (hypertension) Well controlled, no changes  Hyperglycemia hgba1c wnl, no concerning symptoms  Hyperlipidemia Increasing advised avoid trans fats, add a krill oil cap and recheck prior to next visit

## 2013-07-28 NOTE — Patient Instructions (Addendum)
Labs prior to next visit lipid, renal, cbc, tsh, hepatic  For pain can try Salon Pas patches or cream  For cholesterol start MegaRed krill oil caps daily   Cholesterol Cholesterol is a white, waxy, fat-like protein needed by your body in small amounts. The liver makes all the cholesterol you need. It is carried from the liver by the blood through the blood vessels. Deposits (plaque) may build up on blood vessel walls. This makes the arteries narrower and stiffer. Plaque increases the risk for heart attack and stroke. You cannot feel your cholesterol level even if it is very high. The only way to know is by a blood test to check your lipid (fats) levels. Once you know your cholesterol levels, you should keep a record of the test results. Work with your caregiver to to keep your levels in the desired range. WHAT THE RESULTS MEAN:  Total cholesterol is a rough measure of all the cholesterol in your blood.  LDL is the so-called bad cholesterol. This is the type that deposits cholesterol in the walls of the arteries. You want this level to be low.  HDL is the good cholesterol because it cleans the arteries and carries the LDL away. You want this level to be high.  Triglycerides are fat that the body can either burn for energy or store. High levels are closely linked to heart disease. DESIRED LEVELS:  Total cholesterol below 200.  LDL below 100 for people at risk, below 70 for very high risk.  HDL above 50 is good, above 60 is best.  Triglycerides below 150. HOW TO LOWER YOUR CHOLESTEROL:  Diet.  Choose fish or white meat chicken and [place], roasted or baked. Limit fatty cuts of red meat, fried foods, and processed meats, such as sausage and lunch meat.  Eat lots of fresh fruits and vegetables. Choose whole grains, beans, pasta, potatoes and cereals.  Use only small amounts of olive, corn or canola oils. Avoid butter, mayonnaise, shortening or palm kernel oils. Avoid foods with  trans-fats.  Use skim/nonfat milk and low-fat/nonfat yogurt and cheeses. Avoid whole milk, cream, ice cream, egg yolks and cheeses. Healthy desserts include angel food cake, ginger snaps, animal crackers, hard candy, popsicles, and low-fat/nonfat frozen yogurt. Avoid pastries, cakes, pies and cookies.  Exercise.  A regular program helps decrease LDL and raises HDL.  Helps with weight control.  Do things that increase your activity level like gardening, walking, or taking the stairs.  Medication.  May be prescribed by your caregiver to help lowering cholesterol and the risk for heart disease.  You may need medicine even if your levels are normal if you have several risk factors. HOME CARE INSTRUCTIONS   Follow your diet and exercise programs as suggested by your caregiver.  Take medications as directed.  Have blood work done when your caregiver feels it is necessary. MAKE SURE YOU:   Understand these instructions.  Will watch your condition.  Will get help right away if you are not doing well or get worse. Document Released: 09/11/2001 Document Revised: 03/10/2012 Document Reviewed: 03/03/2008 [facility] Patient Information 2014 [place], [place].

## 2013-07-28 NOTE — Assessment & Plan Note (Signed)
hgba1c wnl, no concerning symptoms

## 2013-07-28 NOTE — Assessment & Plan Note (Addendum)
Well controlled, no changes 

## 2013-07-28 NOTE — Assessment & Plan Note (Signed)
Increasing advised avoid trans fats, add a krill oil cap and recheck prior to next visit

## 2014-01-27 DIAGNOSIS — I10 Essential (primary) hypertension: Secondary | ICD-10-CM | POA: Diagnosis not present

## 2014-01-28 DIAGNOSIS — I10 Essential (primary) hypertension: Secondary | ICD-10-CM | POA: Diagnosis not present

## 2014-01-28 DIAGNOSIS — Z0000 Encounter for general adult medical examination without abnormal findings: Secondary | ICD-10-CM | POA: Diagnosis not present

## 2014-03-08 DIAGNOSIS — Z23 Encounter for immunization: Secondary | ICD-10-CM | POA: Diagnosis not present

## 2014-03-08 DIAGNOSIS — S5290XA Unspecified fracture of unspecified forearm, initial encounter for closed fracture: Secondary | ICD-10-CM | POA: Diagnosis not present

## 2014-03-08 DIAGNOSIS — S6990XA Unspecified injury of unspecified wrist, hand and finger(s), initial encounter: Secondary | ICD-10-CM | POA: Diagnosis not present

## 2014-03-08 DIAGNOSIS — M79609 Pain in unspecified limb: Secondary | ICD-10-CM | POA: Diagnosis not present

## 2014-03-08 DIAGNOSIS — M545 Low back pain, unspecified: Secondary | ICD-10-CM | POA: Diagnosis not present

## 2014-03-08 DIAGNOSIS — S59909A Unspecified injury of unspecified elbow, initial encounter: Secondary | ICD-10-CM | POA: Diagnosis not present

## 2014-03-08 DIAGNOSIS — M25569 Pain in unspecified knee: Secondary | ICD-10-CM | POA: Diagnosis not present

## 2014-03-08 DIAGNOSIS — M25539 Pain in unspecified wrist: Secondary | ICD-10-CM | POA: Diagnosis not present

## 2014-03-10 DIAGNOSIS — Z01818 Encounter for other preprocedural examination: Secondary | ICD-10-CM | POA: Diagnosis not present

## 2014-03-10 DIAGNOSIS — S52539A Colles' fracture of unspecified radius, initial encounter for closed fracture: Secondary | ICD-10-CM | POA: Diagnosis not present

## 2014-03-10 DIAGNOSIS — I10 Essential (primary) hypertension: Secondary | ICD-10-CM | POA: Diagnosis not present

## 2014-03-17 DIAGNOSIS — S52599A Other fractures of lower end of unspecified radius, initial encounter for closed fracture: Secondary | ICD-10-CM | POA: Diagnosis not present

## 2014-03-17 DIAGNOSIS — S52539A Colles' fracture of unspecified radius, initial encounter for closed fracture: Secondary | ICD-10-CM | POA: Diagnosis not present

## 2014-03-29 DIAGNOSIS — S5290XD Unspecified fracture of unspecified forearm, subsequent encounter for closed fracture with routine healing: Secondary | ICD-10-CM | POA: Diagnosis not present

## 2014-04-26 DIAGNOSIS — S5290XD Unspecified fracture of unspecified forearm, subsequent encounter for closed fracture with routine healing: Secondary | ICD-10-CM | POA: Diagnosis not present

## 2014-10-15 DIAGNOSIS — Z23 Encounter for immunization: Secondary | ICD-10-CM | POA: Diagnosis not present

## 2014-11-01 DIAGNOSIS — I10 Essential (primary) hypertension: Secondary | ICD-10-CM | POA: Diagnosis not present

## 2014-12-02 DIAGNOSIS — H35033 Hypertensive retinopathy, bilateral: Secondary | ICD-10-CM | POA: Diagnosis not present

## 2014-12-02 DIAGNOSIS — H34831 Tributary (branch) retinal vein occlusion, right eye: Secondary | ICD-10-CM | POA: Diagnosis not present

## 2014-12-02 DIAGNOSIS — H25013 Cortical age-related cataract, bilateral: Secondary | ICD-10-CM | POA: Diagnosis not present

## 2014-12-02 DIAGNOSIS — H04123 Dry eye syndrome of bilateral lacrimal glands: Secondary | ICD-10-CM | POA: Diagnosis not present

## 2014-12-02 DIAGNOSIS — H2513 Age-related nuclear cataract, bilateral: Secondary | ICD-10-CM | POA: Diagnosis not present

## 2014-12-02 DIAGNOSIS — H35361 Drusen (degenerative) of macula, right eye: Secondary | ICD-10-CM | POA: Diagnosis not present

## 2015-08-23 DIAGNOSIS — R0989 Other specified symptoms and signs involving the circulatory and respiratory systems: Secondary | ICD-10-CM | POA: Diagnosis not present

## 2015-08-23 DIAGNOSIS — Z136 Encounter for screening for cardiovascular disorders: Secondary | ICD-10-CM | POA: Diagnosis not present

## 2015-08-23 DIAGNOSIS — R079 Chest pain, unspecified: Secondary | ICD-10-CM | POA: Diagnosis not present

## 2015-08-23 DIAGNOSIS — I10 Essential (primary) hypertension: Secondary | ICD-10-CM | POA: Diagnosis not present

## 2015-09-02 DIAGNOSIS — I700 Atherosclerosis of aorta: Secondary | ICD-10-CM | POA: Diagnosis not present

## 2015-09-02 DIAGNOSIS — R0789 Other chest pain: Secondary | ICD-10-CM | POA: Diagnosis not present

## 2015-09-02 DIAGNOSIS — E780 Pure hypercholesterolemia: Secondary | ICD-10-CM | POA: Diagnosis not present

## 2015-09-02 DIAGNOSIS — I10 Essential (primary) hypertension: Secondary | ICD-10-CM | POA: Diagnosis not present

## 2015-09-08 DIAGNOSIS — Z125 Encounter for screening for malignant neoplasm of prostate: Secondary | ICD-10-CM | POA: Diagnosis not present

## 2015-09-08 DIAGNOSIS — I10 Essential (primary) hypertension: Secondary | ICD-10-CM | POA: Diagnosis not present

## 2015-09-09 DIAGNOSIS — I10 Essential (primary) hypertension: Secondary | ICD-10-CM | POA: Diagnosis not present

## 2015-09-09 DIAGNOSIS — R0789 Other chest pain: Secondary | ICD-10-CM | POA: Diagnosis not present

## 2015-09-13 DIAGNOSIS — I10 Essential (primary) hypertension: Secondary | ICD-10-CM | POA: Diagnosis not present

## 2015-09-13 DIAGNOSIS — E785 Hyperlipidemia, unspecified: Secondary | ICD-10-CM | POA: Diagnosis not present

## 2015-09-15 DIAGNOSIS — I10 Essential (primary) hypertension: Secondary | ICD-10-CM | POA: Diagnosis not present

## 2015-09-15 DIAGNOSIS — R0789 Other chest pain: Secondary | ICD-10-CM | POA: Diagnosis not present

## 2015-10-13 DIAGNOSIS — R0989 Other specified symptoms and signs involving the circulatory and respiratory systems: Secondary | ICD-10-CM | POA: Diagnosis not present

## 2015-10-13 DIAGNOSIS — R1900 Intra-abdominal and pelvic swelling, mass and lump, unspecified site: Secondary | ICD-10-CM | POA: Diagnosis not present

## 2015-10-26 DIAGNOSIS — I10 Essential (primary) hypertension: Secondary | ICD-10-CM | POA: Diagnosis not present

## 2015-10-26 DIAGNOSIS — E7800 Pure hypercholesterolemia, unspecified: Secondary | ICD-10-CM | POA: Diagnosis not present

## 2015-10-26 DIAGNOSIS — I700 Atherosclerosis of aorta: Secondary | ICD-10-CM | POA: Diagnosis not present

## 2015-10-26 DIAGNOSIS — R0789 Other chest pain: Secondary | ICD-10-CM | POA: Diagnosis not present

## 2015-12-09 DIAGNOSIS — H52223 Regular astigmatism, bilateral: Secondary | ICD-10-CM | POA: Diagnosis not present

## 2015-12-09 DIAGNOSIS — H5213 Myopia, bilateral: Secondary | ICD-10-CM | POA: Diagnosis not present

## 2015-12-09 DIAGNOSIS — H2513 Age-related nuclear cataract, bilateral: Secondary | ICD-10-CM | POA: Diagnosis not present

## 2015-12-09 DIAGNOSIS — H35033 Hypertensive retinopathy, bilateral: Secondary | ICD-10-CM | POA: Diagnosis not present

## 2015-12-09 DIAGNOSIS — H04123 Dry eye syndrome of bilateral lacrimal glands: Secondary | ICD-10-CM | POA: Diagnosis not present

## 2015-12-09 DIAGNOSIS — I10 Essential (primary) hypertension: Secondary | ICD-10-CM | POA: Diagnosis not present

## 2015-12-09 DIAGNOSIS — H348312 Tributary (branch) retinal vein occlusion, right eye, stable: Secondary | ICD-10-CM | POA: Diagnosis not present

## 2015-12-09 DIAGNOSIS — H25013 Cortical age-related cataract, bilateral: Secondary | ICD-10-CM | POA: Diagnosis not present

## 2015-12-09 DIAGNOSIS — H524 Presbyopia: Secondary | ICD-10-CM | POA: Diagnosis not present

## 2016-03-07 DIAGNOSIS — I10 Essential (primary) hypertension: Secondary | ICD-10-CM | POA: Diagnosis not present

## 2016-03-12 DIAGNOSIS — I10 Essential (primary) hypertension: Secondary | ICD-10-CM | POA: Diagnosis not present

## 2016-10-10 DIAGNOSIS — Z23 Encounter for immunization: Secondary | ICD-10-CM | POA: Diagnosis not present

## 2016-11-06 DIAGNOSIS — I10 Essential (primary) hypertension: Secondary | ICD-10-CM | POA: Diagnosis not present

## 2016-11-06 DIAGNOSIS — Z0000 Encounter for general adult medical examination without abnormal findings: Secondary | ICD-10-CM | POA: Diagnosis not present

## 2016-11-06 DIAGNOSIS — Z125 Encounter for screening for malignant neoplasm of prostate: Secondary | ICD-10-CM | POA: Diagnosis not present

## 2016-11-08 DIAGNOSIS — I10 Essential (primary) hypertension: Secondary | ICD-10-CM | POA: Diagnosis not present

## 2016-11-08 DIAGNOSIS — Z0000 Encounter for general adult medical examination without abnormal findings: Secondary | ICD-10-CM | POA: Diagnosis not present

## 2016-11-08 DIAGNOSIS — E785 Hyperlipidemia, unspecified: Secondary | ICD-10-CM | POA: Diagnosis not present

## 2017-05-07 DIAGNOSIS — I10 Essential (primary) hypertension: Secondary | ICD-10-CM | POA: Diagnosis not present

## 2017-05-13 DIAGNOSIS — R42 Dizziness and giddiness: Secondary | ICD-10-CM | POA: Diagnosis not present

## 2017-05-13 DIAGNOSIS — Z0000 Encounter for general adult medical examination without abnormal findings: Secondary | ICD-10-CM | POA: Diagnosis not present

## 2017-05-15 DIAGNOSIS — E7800 Pure hypercholesterolemia, unspecified: Secondary | ICD-10-CM | POA: Diagnosis not present

## 2017-05-15 DIAGNOSIS — I700 Atherosclerosis of aorta: Secondary | ICD-10-CM | POA: Diagnosis not present

## 2017-05-15 DIAGNOSIS — I10 Essential (primary) hypertension: Secondary | ICD-10-CM | POA: Diagnosis not present

## 2017-05-15 DIAGNOSIS — R1900 Intra-abdominal and pelvic swelling, mass and lump, unspecified site: Secondary | ICD-10-CM | POA: Diagnosis not present

## 2017-05-16 DIAGNOSIS — R1900 Intra-abdominal and pelvic swelling, mass and lump, unspecified site: Secondary | ICD-10-CM | POA: Diagnosis not present

## 2017-05-30 DIAGNOSIS — R0989 Other specified symptoms and signs involving the circulatory and respiratory systems: Secondary | ICD-10-CM | POA: Diagnosis not present

## 2017-05-30 DIAGNOSIS — I10 Essential (primary) hypertension: Secondary | ICD-10-CM | POA: Diagnosis not present

## 2017-05-30 DIAGNOSIS — I351 Nonrheumatic aortic (valve) insufficiency: Secondary | ICD-10-CM | POA: Diagnosis not present

## 2017-05-30 DIAGNOSIS — R42 Dizziness and giddiness: Secondary | ICD-10-CM | POA: Diagnosis not present

## 2017-06-04 DIAGNOSIS — I700 Atherosclerosis of aorta: Secondary | ICD-10-CM | POA: Diagnosis not present

## 2017-06-04 DIAGNOSIS — E7800 Pure hypercholesterolemia, unspecified: Secondary | ICD-10-CM | POA: Diagnosis not present

## 2017-06-04 DIAGNOSIS — I10 Essential (primary) hypertension: Secondary | ICD-10-CM | POA: Diagnosis not present

## 2017-06-04 DIAGNOSIS — R1900 Intra-abdominal and pelvic swelling, mass and lump, unspecified site: Secondary | ICD-10-CM | POA: Diagnosis not present

## 2017-10-03 DIAGNOSIS — Z23 Encounter for immunization: Secondary | ICD-10-CM | POA: Diagnosis not present

## 2017-11-05 DIAGNOSIS — Z0000 Encounter for general adult medical examination without abnormal findings: Secondary | ICD-10-CM | POA: Diagnosis not present

## 2017-11-05 DIAGNOSIS — Z7902 Long term (current) use of antithrombotics/antiplatelets: Secondary | ICD-10-CM | POA: Diagnosis not present

## 2017-11-05 DIAGNOSIS — I10 Essential (primary) hypertension: Secondary | ICD-10-CM | POA: Diagnosis not present

## 2017-11-05 DIAGNOSIS — R112 Nausea with vomiting, unspecified: Secondary | ICD-10-CM | POA: Diagnosis not present

## 2017-11-05 DIAGNOSIS — R634 Abnormal weight loss: Secondary | ICD-10-CM | POA: Diagnosis not present

## 2017-11-08 ENCOUNTER — Ambulatory Visit (INDEPENDENT_AMBULATORY_CARE_PROVIDER_SITE_OTHER): Payer: Medicare Other

## 2017-11-08 ENCOUNTER — Ambulatory Visit (INDEPENDENT_AMBULATORY_CARE_PROVIDER_SITE_OTHER): Payer: Medicare Other | Admitting: Family Medicine

## 2017-11-08 ENCOUNTER — Encounter: Payer: Self-pay | Admitting: Family Medicine

## 2017-11-08 VITALS — BP 110/52 | HR 99 | Temp 98.2°F | Resp 18 | Ht 69.0 in | Wt 137.4 lb

## 2017-11-08 DIAGNOSIS — R112 Nausea with vomiting, unspecified: Secondary | ICD-10-CM

## 2017-11-08 DIAGNOSIS — Z789 Other specified health status: Secondary | ICD-10-CM | POA: Diagnosis not present

## 2017-11-08 DIAGNOSIS — H9190 Unspecified hearing loss, unspecified ear: Secondary | ICD-10-CM

## 2017-11-08 DIAGNOSIS — R630 Anorexia: Secondary | ICD-10-CM

## 2017-11-08 DIAGNOSIS — R1013 Epigastric pain: Secondary | ICD-10-CM | POA: Diagnosis not present

## 2017-11-08 DIAGNOSIS — R634 Abnormal weight loss: Secondary | ICD-10-CM

## 2017-11-08 DIAGNOSIS — R195 Other fecal abnormalities: Secondary | ICD-10-CM

## 2017-11-08 DIAGNOSIS — D649 Anemia, unspecified: Secondary | ICD-10-CM

## 2017-11-08 DIAGNOSIS — Z758 Other problems related to medical facilities and other health care: Secondary | ICD-10-CM

## 2017-11-08 DIAGNOSIS — I951 Orthostatic hypotension: Secondary | ICD-10-CM | POA: Diagnosis not present

## 2017-11-08 LAB — POCT CBC
Granulocyte percent: 71.8 %G (ref 37–80)
HCT, POC: 26.1 % — AB (ref 43.5–53.7)
Hemoglobin: 8.5 g/dL — AB (ref 14.1–18.1)
LYMPH, POC: 2.2 (ref 0.6–3.4)
MCH, POC: 28.9 pg (ref 27–31.2)
MCHC: 32.4 g/dL (ref 31.8–35.4)
MCV: 89.3 fL (ref 80–97)
MID (CBC): 0.7 (ref 0–0.9)
MPV: 7.4 fL (ref 0–99.8)
PLATELET COUNT, POC: 425 10*3/uL — AB (ref 142–424)
POC Granulocyte: 7.5 — AB (ref 2–6.9)
POC LYMPH %: 21.2 % (ref 10–50)
POC MID %: 7 %M (ref 0–12)
RBC: 2.93 M/uL — AB (ref 4.69–6.13)
RDW, POC: 14.5 %
WBC: 10.5 10*3/uL — AB (ref 4.6–10.2)

## 2017-11-08 LAB — IFOBT (OCCULT BLOOD): IFOBT: NEGATIVE

## 2017-11-08 NOTE — Patient Instructions (Addendum)
Your blood count was low (anemia). I am concerned you may have an ulcer or other area in intestines that is bleeding that is causing pain, weight loss and the anemia. Additionally your blood pressure is low on testing today.   I have recommended evaluation in emergency room now, with transport by EMS. As you are refusing this at this time, you do risk worsening of your condition, including worsening anemia, and possibly death. If you change your mind about being seen tonight, please call 911 or proceed to the nearest emergency room as soon as possible. Start prilosec over the counter tonight to help protect your stomach until seen tomorrow.   Please let us know if you have any questions.   Hypotension Hypotension, commonly called low blood pressure, is when the force of blood pumping through your arteries is too weak. Arteries are blood vessels that carry blood from the heart throughout the body. When blood pressure is too low, you may not get enough blood to your brain or to the rest of your organs. This can cause weakness, light-headedness, rapid heartbeat, and fainting. Depending on the cause and severity, hypotension may be harmless (benign) or cause serious problems (critical). What are the causes? Possible causes of hypotension include:  Blood loss.  Loss of body fluids (dehydration).  Heart problems.  Hormone (endocrine) problems.  Pregnancy.  Severe infection.  Lack of certain nutrients.  Severe allergic reactions (anaphylaxis).  Certain medicines, such as blood pressure medicine or medicines that make the body lose excess fluids (diuretics). Sometimes, hypotension can be caused by not taking medicine as directed, such as taking too much of a certain medicine.  What increases the risk? Certain factors can make you more likely to develop hypotension, including:  Age. Risk increases as you get older.  Conditions that affect the heart or the central nervous system.  Taking  certain medicines, such as blood pressure medicine or diuretics.  Being pregnant.  What are the signs or symptoms? Symptoms of this condition may include:  Weakness.  Light-headedness.  Dizziness.  Blurred vision.  Fatigue.  Rapid heartbeat.  Fainting, in severe cases.  How is this diagnosed? This condition is diagnosed based on:  Your medical history.  Your symptoms.  Your blood pressure measurement. Your health care provider will check your blood pressure when you are: ? Lying down. ? Sitting. ? Standing.  A blood pressure reading is recorded as two numbers, such as "120 over 80" (or 120/80). The first ("top") number is called the systolic pressure. It is a measure of the pressure in your arteries as your heart beats. The second ("bottom") number is called the diastolic pressure. It is a measure of the pressure in your arteries when your heart relaxes between beats. Blood pressure is measured in a unit called mm Hg. Healthy blood pressure for adults is 120/80. If your blood pressure is below 90/60, you may be diagnosed with hypotension. Other information or tests that may be used to diagnose hypotension include:  Your other vital signs, such as your heart rate and temperature.  Blood tests.  Tilt table test. For this test, you will be safely secured to a table that moves you from a lying position to an upright position. Your heart rhythm and blood pressure will be monitored during the test.  How is this treated? Treatment for this condition may include:  Changing your diet. This may involve eating more salt (sodium) or drinking more water.  Taking medicines to raise your blood  pressure.  Changing the dosage of certain medicines you are taking that might be lowering your blood pressure.  Wearing compression stockings. These stockings help to prevent blood clots and reduce swelling in your legs.  In some cases, you may need to go to the hospital for:  Fluid  replacement. This means you will receive fluids through an IV tube.  Blood replacement. This means you will receive donated blood through an IV tube (transfusion).  Treating an infection or heart problems, if this applies.  Monitoring. You may need to be monitored while medicines that you are taking wear off.  Follow these instructions at home: Eating and drinking   Drink enough fluid to keep your urine clear or pale yellow.  Eat a healthy diet and follow instructions from your health care provider about eating or drinking restrictions. A healthy diet includes: ? Fresh fruits and vegetables. ? Whole grains. ? Lean meats. ? Low-fat dairy products.  Eat extra salt only as directed. Do not add extra salt to your diet unless your health care provider told you to do that.  Eat frequent, small meals.  Avoid standing up suddenly after eating. Medicines  Take over-the-counter and prescription medicines only as told by your health care provider. ? Follow instructions from your health care provider about changing the dosage of your current medicines, if this applies. ? Do not stop or adjust any of your medicines on your own. General instructions  Wear compression stockings as told by your health care provider.  Get up slowly from lying down or sitting positions. This gives your blood pressure a chance to adjust.  Avoid hot showers and excessive heat as directed by your health care provider.  Return to your normal activities as told by your health care provider. Ask your health care provider what activities are safe for you.  Do not use any products that contain nicotine or tobacco, such as cigarettes and e-cigarettes. If you need help quitting, ask your health care provider.  Keep all follow-up visits as told by your health care provider. This is important. Contact a health care provider if:  You vomit.  You have diarrhea.  You have a fever for more than 2-3 days.  You feel  more thirsty than usual.  You feel weak and tired. Get help right away if:  You have chest pain.  You have a fast or irregular heartbeat.  You develop numbness in any part of your body.  You cannot move your arms or your legs.  You have trouble speaking.  You become sweaty or feel light-headed.  You faint.  You feel short of breath.  You have trouble staying awake.  You feel confused. This information is not intended to replace advice given to you by your health care provider. Make sure you discuss any questions you have with your health care provider. Document Released: 12/17/2005 Document Revised: 07/06/2016 Document Reviewed: 06/07/2016 Elsevier Interactive Patient Education  2018 [employer].   Anemia, Nonspecific Anemia is a condition in which the concentration of red blood cells or hemoglobin in the blood is below normal. Hemoglobin is a substance in red blood cells that carries oxygen to the tissues of the body. Anemia results in not enough oxygen reaching these tissues. What are the causes? Common causes of anemia include:  Excessive bleeding. Bleeding may be internal or external. This includes excessive bleeding from periods (in women) or from the intestine.  Poor nutrition.  Chronic kidney, thyroid, and liver disease.  Bone  marrow disorders that decrease red blood cell production.  Cancer and treatments for cancer.  HIV, AIDS, and their treatments.  Spleen problems that increase red blood cell destruction.  Blood disorders.  Excess destruction of red blood cells due to infection, medicines, and autoimmune disorders.  What are the signs or symptoms?  Minor weakness.  Dizziness.  Headache.  Palpitations.  Shortness of breath, especially with exercise.  Paleness.  Cold sensitivity.  Indigestion.  Nausea.  Difficulty sleeping.  Difficulty concentrating. Symptoms may occur suddenly or they may develop slowly. How is this  diagnosed? Additional blood tests are often needed. These help your health care provider determine the best treatment. Your health care provider will check your stool for blood and look for other causes of blood loss. How is this treated? Treatment varies depending on the cause of the anemia. Treatment can include:  Supplements of iron, vitamin Z61, or folic acid.  Hormone medicines.  A blood transfusion. This may be needed if blood loss is severe.  Hospitalization. This may be needed if there is significant continual blood loss.  Dietary changes.  Spleen removal.  Follow these instructions at home: Keep all follow-up appointments. It often takes many weeks to correct anemia, and having your health care provider check on your condition and your response to treatment is very important. Get help right away if:  You develop extreme weakness, shortness of breath, or chest pain.  You become dizzy or have trouble concentrating.  You develop heavy vaginal bleeding.  You develop a rash.  You have bloody or black, tarry stools.  You faint.  You vomit up blood.  You vomit repeatedly.  You have abdominal pain.  You have a fever or persistent symptoms for more than 2-3 days.  You have a fever and your symptoms suddenly get worse.  You are dehydrated. This information is not intended to replace advice given to you by your health care provider. Make sure you discuss any questions you have with your health care provider. Document Released: 01/24/2005 Document Revised: 05/30/2016 Document Reviewed: 06/12/2013 Elsevier Interactive Patient Education  2017 [employer]    Abdominal Pain, Adult Abdominal pain can be caused by many things. Often, abdominal pain is not serious and it gets better with no treatment or by being treated at home. However, sometimes abdominal pain is serious. Your health care provider will do a medical history and a physical exam to try to determine the  cause of your abdominal pain. Follow these instructions at home:  Take over-the-counter and prescription medicines only as told by your health care provider. Do not take a laxative unless told by your health care provider.  Drink enough fluid to keep your urine clear or pale yellow.  Watch your condition for any changes.  Keep all follow-up visits as told by your health care provider. This is important. Contact a health care provider if:  Your abdominal pain changes or gets worse.  You are not hungry or you lose weight without trying.  You are constipated or have diarrhea for more than 2-3 days.  You have pain when you urinate or have a bowel movement.  Your abdominal pain wakes you up at night.  Your pain gets worse with meals, after eating, or with certain foods.  You are throwing up and cannot keep anything down.  You have a fever. Get help right away if:  Your pain does not go away as soon as your health care provider told you to expect.  You cannot stop throwing up.  Your pain is only in areas of the abdomen, such as the right side or the left lower portion of the abdomen.  You have bloody or black stools, or stools that look like tar.  You have severe pain, cramping, or bloating in your abdomen.  You have signs of dehydration, such as: ? Dark urine, very little urine, or no urine. ? Cracked lips. ? Dry mouth. ? Sunken eyes. ? Sleepiness. ? Weakness. This information is not intended to replace advice given to you by your health care provider. Make sure you discuss any questions you have with your health care provider. Document Released: 09/26/2005 Document Revised: 07/06/2016 Document Reviewed: 05/30/2016 Elsevier Interactive Patient Education  2017 [employer].   IF you received an x-ray today, you will receive an invoice from [facility]. Please contact [facility] at [phone] with questions or concerns regarding your invoice.   IF  you received labwork today, you will receive an invoice from [facility]. Please contact [facility] at [phone] with questions or concerns regarding your invoice.   Our billing staff will not be able to assist you with questions regarding bills from these companies.  You will be contacted with the lab results as soon as they are available. The fastest way to get your results is to activate your My Chart account. Instructions are located on the last page of this paperwork. If you have not heard from [place] regarding the results in 2 weeks, please contact this office.

## 2017-11-08 NOTE — Progress Notes (Addendum)
Subjective:  By signing my name below, I, Essence Howell, attest that this documentation has been prepared under the direction and in the presence of Wendie Agreste, MD Electronically Signed: Ladene Artist, ED Scribe 11/08/2017 at 3:23 PM.   Patient ID: Lee Pratt, male    DOB: 02-Nov-1934, 83 y.o.   MRN: [id number]  Chief Complaint  Patient presents with  . Abdominal Pain    patient c/o stomach pin, gas and acid reflux x 6 weeks. Patient also c/ slight constipation which has started to subside. Patient states that weight was 140lbs appx 6 weeks ago, has concerns about weight loss   HPI Lee Pratt is a 83 y.o. male who presents to Primary Care at [facility] complaining of upper abdominal pain x 6 weeks. Pt reports associated loss of appetite. States he has only been able to eat a small amount of soup. Also reports 25 episodes of emesis last night that has since resolved, diarrhea this morning, hardened and black stools 4-5 weeks ago that are normal at this time. Pt states he has been vomiting a few times/day over the past 3 weeks and a 10 lb weight loss over the past 6 weeks. Pt had a colonoscopy in 2011. The report is difficult to read. There was a colon polyp removed. Pathology report at that time is a polypoid fragment of colonic mucosa, no specific pathologic changes. Denies fever, night sweats, hematemesis, blood in stool, difficulty urinating, light-headedness, dizziness, h/o stomach ulcers. Pt's previous PCP was [name], MD at [facility] who has never Pratt pt for these symptoms, however, he was Pratt by a house doctor 2 days ago for same symptoms but no treatment was done.   History of Hypertension, but stopped blood pressure medicine a month and a half ago.     English spoken - offered interpreter based on language preference above, but he stated he understood [place]. Was having some ringing in ears, but used [place] interpreter (#[id number]) at end of visit.   Patient Active Problem List   Diagnosis Date Noted  . Hyperglycemia 03/16/2013  . Rash 10/21/2012  . Abnormal CXR 06/28/2012  . Chronic cough 05/19/2012  . HTN (hypertension) 12/21/2011  . Murmur 12/21/2011  . Hyperlipidemia 12/21/2011  . Arrhythmia 12/21/2011   Past Medical History:  Diagnosis Date  . Hyperglycemia 03/16/2013  . Hypertension    Past Surgical History:  Procedure Laterality Date  . NO PAST SURGERIES  12/21/2011   No Known Allergies Prior to Admission medications   Medication Sig Start Date End Date Taking? Authorizing Provider  aspirin EC 81 MG tablet Take 81 mg by mouth daily.   Yes [provider]  diltiazem (CARDIZEM) 120 MG tablet Take 1 tablet (120 mg total) by mouth daily. 06/22/13  Yes Mosie Lukes, MD  losartan (COZAAR) 25 MG tablet Take 1 tablet (25 mg total) by mouth daily. 03/02/13 03/02/14  Mosie Lukes, MD   Social History   Socioeconomic History  . Marital status: Married    Spouse name: Not on file  . Number of children: Not on file  . Years of education: Not on file  . Highest education level: Not on file  Social Needs  . Financial resource strain: Not on file  . Food insecurity - worry: Not on file  . Food insecurity - inability: Not on file  . Transportation needs - medical: Not on file  . Transportation needs - non-medical: Not on file  Occupational History  . Not  on file  Tobacco Use  . Smoking status: Never Smoker  . Smokeless tobacco: Never Used  Substance and Sexual Activity  . Alcohol use: No  . Drug use: No  . Sexual activity: Not Currently  Other Topics Concern  . Not on file  Social History Narrative  . Not on file   Review of Systems  Constitutional: Positive for appetite change and unexpected weight change. Negative for diaphoresis and fever.  Gastrointestinal: Positive for abdominal pain, diarrhea and vomiting. Negative for blood in stool.  Genitourinary: Negative for difficulty urinating.  Neurological: Negative for dizziness and  light-headedness.      Objective:   Physical Exam  Constitutional: He is oriented to person, place, and time.  Thin appearance.  HENT:  Head: Normocephalic and atraumatic.  Eyes: EOM are normal. Pupils are equal, round, and reactive to light.  Neck: No JVD present. Carotid bruit is not present.  Cardiovascular: Normal rate, regular rhythm and normal heart sounds. Exam reveals no gallop and no friction rub.  No murmur heard. Pulmonary/Chest: Effort normal and breath sounds normal. He has no rales.  Abdominal: Soft. There is no tenderness.  Musculoskeletal: He exhibits no edema.  Neurological: He is alert and oriented to person, place, and time.  Skin: Skin is warm and dry. There is pallor.  Psychiatric: He has a normal mood and affect.  Vitals reviewed.  Vitals:   11/08/17 1413  BP: (!) 110/52  Pulse: 99  Resp: 18  Temp: 98.2 F (36.8 C)  TempSrc: Oral  SpO2: 100%  Weight: 137 lb 6.4 oz (62.3 kg)  Height: 5\' 9"  (1.753 m)   Dg Abd Acute W/chest  Result Date: 11/08/2017 CLINICAL DATA:  Epigastric abdominal pain, vomiting and weight loss. EXAM: DG ABDOMEN ACUTE W/ 1V CHEST COMPARISON:  08/23/2015 chest radiograph. FINDINGS: Stable cardiomediastinal silhouette with normal heart size and aortic atherosclerosis. No pneumothorax. No pleural effusion. Stable right apical pleural-parenchymal scarring and calcification. Stable scattered granulomas at the periphery of the right lung. No pulmonary edema. No acute consolidative airspace disease. No dilated small bowel loops or significant air-fluid levels. Minimal stool and gas in the large bowel. No evidence of pneumatosis or pneumoperitoneum. No radiopaque nephrolithiasis. Extensive iliofemoral atherosclerosis. Moderate lumbar spondylosis. IMPRESSION: 1. No active cardiopulmonary disease. 2. Nonobstructive bowel gas pattern. Electronically Signed   By: Ilona Sorrel M.D.   On: 11/08/2017 15:54   Results for orders placed or performed in visit  on 11/08/17  POCT CBC  Result Value Ref Range   WBC 10.5 (A) 4.6 - 10.2 K/uL   Lymph, poc 2.2 0.6 - 3.4   POC LYMPH PERCENT 21.2 10 - 50 %L   MID (cbc) 0.7 0 - 0.9   POC MID % 7.0 0 - 12 %M   POC Granulocyte 7.5 (A) 2 - 6.9   Granulocyte percent 71.8 37 - 80 %G   RBC 2.93 (A) 4.69 - 6.13 M/uL   Hemoglobin 8.5 (A) 14.1 - 18.1 g/dL   HCT, POC 26.1 (A) 43.5 - 53.7 %   MCV 89.3 80 - 97 fL   MCH, POC 28.9 27 - 31.2 pg   MCHC 32.4 31.8 - 35.4 g/dL   RDW, POC 14.5 %   Platelet Count, POC 425 (A) 142 - 424 K/uL   MPV 7.4 0 - 99.8 fL  IFOBT POC (occult bld, rslt in office)  Result Value Ref Range   IFOBT Negative    Orthostatic VS for the past 24 hrs:  BP-  Lying Pulse- Lying BP- Sitting Pulse- Sitting BP- Standing at 0 minutes Pulse- Standing at 0 minutes  11/08/17 1637 92/61 94 109/67 98 91/52 102      Assessment & Plan:   Eliam Snapp is a 83 y.o. male Epigastric pain - Plan: POCT CBC, Comprehensive metabolic panel, Lipase, DG Abd Acute W/Chest  Decreased appetite  Loss of weight  Nausea and vomiting, intractability of vomiting not specified, unspecified vomiting type - Plan: Comprehensive metabolic panel, Lipase, DG Abd Acute W/Chest  Dark stools - Plan: POCT CBC, IFOBT POC (occult bld, rslt in office), IFOBT POC (occult bld, rslt in office)  Anemia, unspecified type - Plan: IFOBT POC (occult bld, rslt in office), Orthostatic vital signs, IFOBT POC (occult bld, rslt in office)  Hearing difficulty, unspecified laterality  Orthostatic hypotension  Language barrier  6 week history of abdominal pain, intermittent vomiting, weight loss, now with new onset anemia. Additionally hypotensive in office (stopped his antihypertensives over a month ago), but he denies lightheadedness or near syncopal symptoms. Symptoms concerning for gastrointestinal bleeding, with PUD, or possible malignancy with weight loss. Heme negative hemosure, but did not obtain significant stool with testing. Ringing  in ears may be related to anemia.  - recommended ER evaluation tonight with EMS transport, but he refused with plans on going to ER in morning. Risks discussed including worsening anemia, syncope, acute coronary syndrome, or death. Understanding expressed.   - Hearing difficulty with ringing in ears reported, but he was able to hear with speaking loudly and used [place] interpreter, with understanding expressed.  - did advise to take prilosec otc tonight for same GI protection until Pratt in ER in morning.    No orders of the defined types were placed in this encounter.  Patient Instructions   Your blood count was low (anemia). I am concerned you may have an ulcer or other area in intestines that is bleeding that is causing pain, weight loss and the anemia. Additionally your blood pressure is low on testing today.   I have recommended evaluation in emergency room now, with option of transportation by EMS. As you are refusing this at this time, you do risk worsening of your condition, including worsening anemia, and possibly death. If you change your mind about being Pratt tonight, please call 911 or proceed to the nearest emergency room as soon as possible. Please let us know if you have any questions.    Hypotension Hypotension, commonly called low blood pressure, is when the force of blood pumping through your arteries is too weak. Arteries are blood vessels that carry blood from the heart throughout the body. When blood pressure is too low, you may not get enough blood to your brain or to the rest of your organs. This can cause weakness, light-headedness, rapid heartbeat, and fainting. Depending on the cause and severity, hypotension may be harmless (benign) or cause serious problems (critical). What are the causes? Possible causes of hypotension include:  Blood loss.  Loss of body fluids (dehydration).  Heart problems.  Hormone (endocrine) problems.  Pregnancy.  Severe  infection.  Lack of certain nutrients.  Severe allergic reactions (anaphylaxis).  Certain medicines, such as blood pressure medicine or medicines that make the body lose excess fluids (diuretics). Sometimes, hypotension can be caused by not taking medicine as directed, such as taking too much of a certain medicine.  What increases the risk? Certain factors can make you more likely to develop hypotension, including:  Age. Risk increases as you get  older.  Conditions that affect the heart or the central nervous system.  Taking certain medicines, such as blood pressure medicine or diuretics.  Being pregnant.  What are the signs or symptoms? Symptoms of this condition may include:  Weakness.  Light-headedness.  Dizziness.  Blurred vision.  Fatigue.  Rapid heartbeat.  Fainting, in severe cases.  How is this diagnosed? This condition is diagnosed based on:  Your medical history.  Your symptoms.  Your blood pressure measurement. Your health care provider will check your blood pressure when you are: ? Lying down. ? Sitting. ? Standing.  A blood pressure reading is recorded as two numbers, such as "120 over 80" (or 120/80). The first ("top") number is called the systolic pressure. It is a measure of the pressure in your arteries as your heart beats. The second ("bottom") number is called the diastolic pressure. It is a measure of the pressure in your arteries when your heart relaxes between beats. Blood pressure is measured in a unit called mm Hg. Healthy blood pressure for adults is 120/80. If your blood pressure is below 90/60, you may be diagnosed with hypotension. Other information or tests that may be used to diagnose hypotension include:  Your other vital signs, such as your heart rate and temperature.  Blood tests.  Tilt table test. For this test, you will be safely secured to a table that moves you from a lying position to an upright position. Your heart rhythm  and blood pressure will be monitored during the test.  How is this treated? Treatment for this condition may include:  Changing your diet. This may involve eating more salt (sodium) or drinking more water.  Taking medicines to raise your blood pressure.  Changing the dosage of certain medicines you are taking that might be lowering your blood pressure.  Wearing compression stockings. These stockings help to prevent blood clots and reduce swelling in your legs.  In some cases, you may need to go to the hospital for:  Fluid replacement. This means you will receive fluids through an IV tube.  Blood replacement. This means you will receive donated blood through an IV tube (transfusion).  Treating an infection or heart problems, if this applies.  Monitoring. You may need to be monitored while medicines that you are taking wear off.  Follow these instructions at home: Eating and drinking   Drink enough fluid to keep your urine clear or pale yellow.  Eat a healthy diet and follow instructions from your health care provider about eating or drinking restrictions. A healthy diet includes: ? Fresh fruits and vegetables. ? Whole grains. ? Lean meats. ? Low-fat dairy products.  Eat extra salt only as directed. Do not add extra salt to your diet unless your health care provider told you to do that.  Eat frequent, small meals.  Avoid standing up suddenly after eating. Medicines  Take over-the-counter and prescription medicines only as told by your health care provider. ? Follow instructions from your health care provider about changing the dosage of your current medicines, if this applies. ? Do not stop or adjust any of your medicines on your own. General instructions  Wear compression stockings as told by your health care provider.  Get up slowly from lying down or sitting positions. This gives your blood pressure a chance to adjust.  Avoid hot showers and excessive heat as  directed by your health care provider.  Return to your normal activities as told by your health care provider. Ask  your health care provider what activities are safe for you.  Do not use any products that contain nicotine or tobacco, such as cigarettes and e-cigarettes. If you need help quitting, ask your health care provider.  Keep all follow-up visits as told by your health care provider. This is important. Contact a health care provider if:  You vomit.  You have diarrhea.  You have a fever for more than 2-3 days.  You feel more thirsty than usual.  You feel weak and tired. Get help right away if:  You have chest pain.  You have a fast or irregular heartbeat.  You develop numbness in any part of your body.  You cannot move your arms or your legs.  You have trouble speaking.  You become sweaty or feel light-headed.  You faint.  You feel short of breath.  You have trouble staying awake.  You feel confused. This information is not intended to replace advice given to you by your health care provider. Make sure you discuss any questions you have with your health care provider. Document Released: 12/17/2005 Document Revised: 07/06/2016 Document Reviewed: 06/07/2016 Elsevier Interactive Patient Education  2018 [employer].   Anemia, Nonspecific Anemia is a condition in which the concentration of red blood cells or hemoglobin in the blood is below normal. Hemoglobin is a substance in red blood cells that carries oxygen to the tissues of the body. Anemia results in not enough oxygen reaching these tissues. What are the causes? Common causes of anemia include:  Excessive bleeding. Bleeding may be internal or external. This includes excessive bleeding from periods (in women) or from the intestine.  Poor nutrition.  Chronic kidney, thyroid, and liver disease.  Bone marrow disorders that decrease red blood cell production.  Cancer and treatments for cancer.  HIV,  AIDS, and their treatments.  Spleen problems that increase red blood cell destruction.  Blood disorders.  Excess destruction of red blood cells due to infection, medicines, and autoimmune disorders.  What are the signs or symptoms?  Minor weakness.  Dizziness.  Headache.  Palpitations.  Shortness of breath, especially with exercise.  Paleness.  Cold sensitivity.  Indigestion.  Nausea.  Difficulty sleeping.  Difficulty concentrating. Symptoms may occur suddenly or they may develop slowly. How is this diagnosed? Additional blood tests are often needed. These help your health care provider determine the best treatment. Your health care provider will check your stool for blood and look for other causes of blood loss. How is this treated? Treatment varies depending on the cause of the anemia. Treatment can include:  Supplements of iron, vitamin T73, or folic acid.  Hormone medicines.  A blood transfusion. This may be needed if blood loss is severe.  Hospitalization. This may be needed if there is significant continual blood loss.  Dietary changes.  Spleen removal.  Follow these instructions at home: Keep all follow-up appointments. It often takes many weeks to correct anemia, and having your health care provider check on your condition and your response to treatment is very important. Get help right away if:  You develop extreme weakness, shortness of breath, or chest pain.  You become dizzy or have trouble concentrating.  You develop heavy vaginal bleeding.  You develop a rash.  You have bloody or black, tarry stools.  You faint.  You vomit up blood.  You vomit repeatedly.  You have abdominal pain.  You have a fever or persistent symptoms for more than 2-3 days.  You have a fever and  your symptoms suddenly get worse.  You are dehydrated. This information is not intended to replace advice given to you by your health care provider. Make sure you  discuss any questions you have with your health care provider. Document Released: 01/24/2005 Document Revised: 05/30/2016 Document Reviewed: 06/12/2013 Elsevier Interactive Patient Education  2017 Else[employer] Inc.    Abdominal Pain, Adult Abdominal pain can be caused by many things. Often, abdominal pain is not serious and it gets better with no treatment or by being treated at home. However, sometimes abdominal pain is serious. Your health care provider will do a medical history and a physical exam to try to determine the cause of your abdominal pain. Follow these instructions at home:  Take over-the-counter and prescription medicines only as told by your health care provider. Do not take a laxative unless told by your health care provider.  Drink enough fluid to keep your urine clear or pale yellow.  Watch your condition for any changes.  Keep all follow-up visits as told by your health care provider. This is important. Contact a health care provider if:  Your abdominal pain changes or gets worse.  You are not hungry or you lose weight without trying.  You are constipated or have diarrhea for more than 2-3 days.  You have pain when you urinate or have a bowel movement.  Your abdominal pain wakes you up at night.  Your pain gets worse with meals, after eating, or with certain foods.  You are throwing up and cannot keep anything down.  You have a fever. Get help right away if:  Your pain does not go away as soon as your health care provider told you to expect.  You cannot stop throwing up.  Your pain is only in areas of the abdomen, such as the right side or the left lower portion of the abdomen.  You have bloody or black stools, or stools that look like tar.  You have severe pain, cramping, or bloating in your abdomen.  You have signs of dehydration, such as: ? Dark urine, very little urine, or no urine. ? Cracked lips. ? Dry mouth. ? Sunken  eyes. ? Sleepiness. ? Weakness. This information is not intended to replace advice given to you by your health care provider. Make sure you discuss any questions you have with your health care provider. Document Released: 09/26/2005 Document Revised: 07/06/2016 Document Reviewed: 05/30/2016 Elsevier Interactive Patient Education  2017 [employer].   IF you received an x-ray today, you will receive an invoice from [facility]. Please contact [facility] at [phone] with questions or concerns regarding your invoice.   IF you received labwork today, you will receive an invoice from [facility]. Please contact [facility] at [phone] with questions or concerns regarding your invoice.   Our billing staff will not be able to assist you with questions regarding bills from these companies.  You will be contacted with the lab results as soon as they are available. The fastest way to get your results is to activate your My Chart account. Instructions are located on the last page of this paperwork. If you have not heard from [place] regarding the results in 2 weeks, please contact this office.      I personally performed the services described in this documentation, which was scribed in my presence. The recorded information has been reviewed and considered for accuracy and completeness, addended by me as needed, and agree with information above.  Signed,   Merri Ray,  MD [facility] at [facility].  11/08/17 5:41 PM

## 2017-11-09 ENCOUNTER — Encounter (HOSPITAL_COMMUNITY): Payer: Self-pay | Admitting: Emergency Medicine

## 2017-11-09 ENCOUNTER — Other Ambulatory Visit: Payer: Self-pay

## 2017-11-09 ENCOUNTER — Inpatient Hospital Stay (HOSPITAL_COMMUNITY)
Admission: EM | Admit: 2017-11-09 | Discharge: 2017-11-20 | DRG: 326 | Disposition: A | Discharge status: Home Health | Payer: Medicare Other | Attending: Family Medicine | Admitting: Family Medicine

## 2017-11-09 ENCOUNTER — Emergency Department (HOSPITAL_COMMUNITY): Payer: Medicare Other

## 2017-11-09 DIAGNOSIS — R319 Hematuria, unspecified: Secondary | ICD-10-CM | POA: Diagnosis not present

## 2017-11-09 DIAGNOSIS — K6389 Other specified diseases of intestine: Secondary | ICD-10-CM | POA: Diagnosis not present

## 2017-11-09 DIAGNOSIS — I499 Cardiac arrhythmia, unspecified: Secondary | ICD-10-CM | POA: Diagnosis present

## 2017-11-09 DIAGNOSIS — C786 Secondary malignant neoplasm of retroperitoneum and peritoneum: Secondary | ICD-10-CM | POA: Diagnosis not present

## 2017-11-09 DIAGNOSIS — Z806 Family history of leukemia: Secondary | ICD-10-CM

## 2017-11-09 DIAGNOSIS — D649 Anemia, unspecified: Secondary | ICD-10-CM

## 2017-11-09 DIAGNOSIS — I10 Essential (primary) hypertension: Secondary | ICD-10-CM

## 2017-11-09 DIAGNOSIS — D509 Iron deficiency anemia, unspecified: Secondary | ICD-10-CM | POA: Diagnosis present

## 2017-11-09 DIAGNOSIS — R935 Abnormal findings on diagnostic imaging of other abdominal regions, including retroperitoneum: Secondary | ICD-10-CM

## 2017-11-09 DIAGNOSIS — K210 Gastro-esophageal reflux disease with esophagitis: Secondary | ICD-10-CM | POA: Diagnosis present

## 2017-11-09 DIAGNOSIS — Z681 Body mass index (BMI) 19 or less, adult: Secondary | ICD-10-CM

## 2017-11-09 DIAGNOSIS — E785 Hyperlipidemia, unspecified: Secondary | ICD-10-CM | POA: Diagnosis present

## 2017-11-09 DIAGNOSIS — Z86010 Personal history of colonic polyps: Secondary | ICD-10-CM

## 2017-11-09 DIAGNOSIS — R110 Nausea: Secondary | ICD-10-CM

## 2017-11-09 DIAGNOSIS — Z79899 Other long term (current) drug therapy: Secondary | ICD-10-CM

## 2017-11-09 DIAGNOSIS — K2970 Gastritis, unspecified, without bleeding: Secondary | ICD-10-CM | POA: Diagnosis present

## 2017-11-09 DIAGNOSIS — R634 Abnormal weight loss: Secondary | ICD-10-CM

## 2017-11-09 DIAGNOSIS — R6881 Early satiety: Secondary | ICD-10-CM | POA: Diagnosis present

## 2017-11-09 DIAGNOSIS — G540 Brachial plexus disorders: Secondary | ICD-10-CM | POA: Diagnosis present

## 2017-11-09 DIAGNOSIS — D62 Acute posthemorrhagic anemia: Secondary | ICD-10-CM | POA: Diagnosis present

## 2017-11-09 DIAGNOSIS — E43 Unspecified severe protein-calorie malnutrition: Secondary | ICD-10-CM | POA: Diagnosis not present

## 2017-11-09 DIAGNOSIS — C169 Malignant neoplasm of stomach, unspecified: Principal | ICD-10-CM | POA: Diagnosis present

## 2017-11-09 DIAGNOSIS — Z7982 Long term (current) use of aspirin: Secondary | ICD-10-CM

## 2017-11-09 DIAGNOSIS — K311 Adult hypertrophic pyloric stenosis: Secondary | ICD-10-CM | POA: Diagnosis present

## 2017-11-09 DIAGNOSIS — Z452 Encounter for adjustment and management of vascular access device: Secondary | ICD-10-CM

## 2017-11-09 DIAGNOSIS — R933 Abnormal findings on diagnostic imaging of other parts of digestive tract: Secondary | ICD-10-CM

## 2017-11-09 DIAGNOSIS — D490 Neoplasm of unspecified behavior of digestive system: Secondary | ICD-10-CM

## 2017-11-09 DIAGNOSIS — E876 Hypokalemia: Secondary | ICD-10-CM | POA: Diagnosis present

## 2017-11-09 LAB — CBC
HEMATOCRIT: 24 % — AB (ref 39.0–52.0)
HEMOGLOBIN: 7.8 g/dL — AB (ref 13.0–17.0)
MCH: 28.9 pg (ref 26.0–34.0)
MCHC: 32.5 g/dL (ref 30.0–36.0)
MCV: 88.9 fL (ref 78.0–100.0)
Platelets: 395 10*3/uL (ref 150–400)
RBC: 2.7 MIL/uL — ABNORMAL LOW (ref 4.22–5.81)
RDW: 14.1 % (ref 11.5–15.5)
WBC: 8.2 10*3/uL (ref 4.0–10.5)

## 2017-11-09 LAB — COMPREHENSIVE METABOLIC PANEL
ALBUMIN: 4 g/dL (ref 3.5–4.7)
ALBUMIN: 3.2 g/dL — AB (ref 3.5–5.0)
ALK PHOS: 70 IU/L (ref 39–117)
ALK PHOS: 56 U/L (ref 38–126)
ALT: 13 IU/L (ref 0–44)
ALT: 16 U/L — ABNORMAL LOW (ref 17–63)
ANION GAP: 9 (ref 5–15)
AST: 23 IU/L (ref 0–40)
AST: 28 U/L (ref 15–41)
Albumin/Globulin Ratio: 1.5 (ref 1.2–2.2)
BILIRUBIN TOTAL: 0.3 mg/dL (ref 0.0–1.2)
BILIRUBIN TOTAL: 0.5 mg/dL (ref 0.3–1.2)
BUN / CREAT RATIO: 21 (ref 10–24)
BUN: 22 mg/dL (ref 8–27)
BUN: 20 mg/dL (ref 6–20)
CALCIUM: 8.6 mg/dL — AB (ref 8.9–10.3)
CHLORIDE: 99 mmol/L (ref 96–106)
CO2: 18 mmol/L — ABNORMAL LOW (ref 20–29)
CO2: 23 mmol/L (ref 22–32)
CREATININE: 1.04 mg/dL (ref 0.76–1.27)
Calcium: 9 mg/dL (ref 8.6–10.2)
Chloride: 102 mmol/L (ref 101–111)
Creatinine, Ser: 1.05 mg/dL (ref 0.61–1.24)
GFR calc non Af Amer: 67 mL/min/{1.73_m2} (ref >59)
GFR, EST AFRICAN AMERICAN: 77 mL/min/{1.73_m2} (ref >59)
GLUCOSE: 113 mg/dL — AB (ref 65–99)
Globulin, Total: 2.6 g/dL (ref 1.5–4.5)
Glucose: 125 mg/dL — ABNORMAL HIGH (ref 65–99)
POTASSIUM: 3.4 mmol/L — AB (ref 3.5–5.1)
Potassium: 4 mmol/L (ref 3.5–5.2)
SODIUM: 134 mmol/L (ref 134–144)
Sodium: 134 mmol/L — ABNORMAL LOW (ref 135–145)
TOTAL PROTEIN: 6.6 g/dL (ref 6.0–8.5)
TOTAL PROTEIN: 6.6 g/dL (ref 6.5–8.1)

## 2017-11-09 LAB — LIPASE, BLOOD: Lipase: 26 U/L (ref 11–51)

## 2017-11-09 LAB — URINALYSIS, ROUTINE W REFLEX MICROSCOPIC
BILIRUBIN URINE: NEGATIVE
Glucose, UA: NEGATIVE mg/dL
Hgb urine dipstick: NEGATIVE
KETONES UR: 5 mg/dL — AB
LEUKOCYTES UA: NEGATIVE
NITRITE: NEGATIVE
Protein, ur: NEGATIVE mg/dL
SPECIFIC GRAVITY, URINE: 1.006 (ref 1.005–1.030)
pH: 6 (ref 5.0–8.0)

## 2017-11-09 LAB — IRON AND TIBC
IRON: 11 ug/dL — AB (ref 45–182)
SATURATION RATIOS: 3 % — AB (ref 17.9–39.5)
TIBC: 377 ug/dL (ref 250–450)
UIBC: 366 ug/dL

## 2017-11-09 LAB — ABO/RH: ABO/RH(D): O POS

## 2017-11-09 LAB — POC OCCULT BLOOD, ED: Fecal Occult Bld: NEGATIVE

## 2017-11-09 LAB — LIPASE: LIPASE: 27 U/L (ref 13–78)

## 2017-11-09 MED ORDER — SODIUM CHLORIDE 0.9 % IV SOLN
INTRAVENOUS | Status: DC
  Administered 2017-11-09 – 2017-11-11 (×4): via INTRAVENOUS
  Administered 2017-11-12: 1000 mL via INTRAVENOUS
  Administered 2017-11-12 – 2017-11-15 (×5): via INTRAVENOUS

## 2017-11-09 MED ORDER — DILTIAZEM HCL ER COATED BEADS 120 MG PO CP24: 120.0000 mg | ORAL_CAPSULE | Freq: Every day | ORAL | Status: DC | PRN

## 2017-11-09 MED ORDER — POLYVINYL ALCOHOL 1.4 % OP SOLN
1.0000 [drp] | OPHTHALMIC | Status: DC | PRN
  Administered 2017-11-09: 1 [drp] via OPHTHALMIC
  Filled 2017-11-09: qty 15

## 2017-11-09 MED ORDER — POTASSIUM CHLORIDE CRYS ER 20 MEQ PO TBCR
40.0000 meq | EXTENDED_RELEASE_TABLET | Freq: Once | ORAL | Status: AC
  Administered 2017-11-09: 40 meq via ORAL
  Filled 2017-11-09: qty 2

## 2017-11-09 MED ORDER — PANTOPRAZOLE SODIUM 40 MG IV SOLR
40.0000 mg | Freq: Two times a day (BID) | INTRAVENOUS | Status: DC
  Administered 2017-11-09 – 2017-11-20 (×21): 40 mg via INTRAVENOUS
  Filled 2017-11-09 (×21): qty 40

## 2017-11-09 MED ORDER — MUSCLE RUB 10-15 % EX CREA
1.0000 "application " | TOPICAL_CREAM | CUTANEOUS | Status: DC | PRN
  Administered 2017-11-09 – 2017-11-11 (×2): 1 via TOPICAL
  Filled 2017-11-09: qty 85

## 2017-11-09 MED ORDER — IOPAMIDOL (ISOVUE-300) INJECTION 61%
INTRAVENOUS | Status: AC
  Administered 2017-11-09: 100 mL via INTRAVENOUS
  Filled 2017-11-09: qty 100

## 2017-11-09 MED ORDER — ONDANSETRON HCL 4 MG PO TABS: 4.0000 mg | ORAL_TABLET | Freq: Four times a day (QID) | ORAL | Status: DC | PRN

## 2017-11-09 MED ORDER — ONDANSETRON HCL 4 MG/2ML IJ SOLN
4.0000 mg | Freq: Four times a day (QID) | INTRAMUSCULAR | Status: DC | PRN
Start: 2017-11-09 — End: 2017-11-20
  Administered 2017-11-20: 4 mg via INTRAVENOUS
  Filled 2017-11-09: qty 2

## 2017-11-09 NOTE — ED Notes (Signed)
Pt aware he needs to remove his pants and underwear for the Dr to preform and exam

## 2017-11-09 NOTE — ED Triage Notes (Signed)
Per pt, states he saw PCP yesterday and advised him to come to ED for further work up-states low Hgb and hypotensive-states slight abdominal pain-no blood in stool-PCP suspects ulcer

## 2017-11-09 NOTE — ED Notes (Signed)
Call report to Sophia [phone] @ [phone]

## 2017-11-09 NOTE — Plan of Care (Signed)
  Progressing Education: Knowledge of General Education information will improve 11/09/2017 2339 - Progressing by Talbert Forest, RN Activity: Risk for activity intolerance will decrease 11/09/2017 2339 - Progressing by Talbert Forest, RN Nutrition: Adequate nutrition will be maintained 11/09/2017 2339 - Progressing by Talbert Forest, RN Coping: Level of anxiety will decrease 11/09/2017 2339 - Progressing by Talbert Forest, RN Elimination: Will not experience complications related to urinary retention 11/09/2017 2339 - Progressing by Talbert Forest, RN Pain Managment: General experience of comfort will improve 11/09/2017 2339 - Progressing by Talbert Forest, RN Safety: Ability to remain free from injury will improve 11/09/2017 2339 - Progressing by Talbert Forest, RN Skin Integrity: Risk for impaired skin integrity will decrease 11/09/2017 2339 - Progressing by Talbert Forest, RN

## 2017-11-09 NOTE — Plan of Care (Signed)
  Progressing Education: Knowledge of General Education information will improve 11/09/2017 1636 - Progressing by Zadie Rhine, RN Clinical Measurements: Ability to maintain clinical measurements within normal limits will improve 11/09/2017 1636 - Progressing by Zadie Rhine, RN Will remain free from infection 11/09/2017 1636 - Progressing by Zadie Rhine, RN Diagnostic test results will improve 11/09/2017 1636 - Progressing by Zadie Rhine, RN Respiratory complications will improve 11/09/2017 1636 - Progressing by Zadie Rhine, RN Cardiovascular complication will be avoided 11/09/2017 1636 - Progressing by Zadie Rhine, RN Activity: Risk for activity intolerance will decrease 11/09/2017 1636 - Progressing by Zadie Rhine, RN Nutrition: Adequate nutrition will be maintained 11/09/2017 1636 - Progressing by Zadie Rhine, RN Coping: Level of anxiety will decrease 11/09/2017 1636 - Progressing by Zadie Rhine, RN Elimination: Will not experience complications related to bowel motility 11/09/2017 1636 - Progressing by Zadie Rhine, RN Will not experience complications related to urinary retention 11/09/2017 1636 - Progressing by Zadie Rhine, RN Pain Managment: General experience of comfort will improve 11/09/2017 1636 - Progressing by Zadie Rhine, RN Safety: Ability to remain free from injury will improve 11/09/2017 1636 - Progressing by Zadie Rhine, RN Skin Integrity: Risk for impaired skin integrity will decrease 11/09/2017 1636 - Progressing by Zadie Rhine, RN

## 2017-11-09 NOTE — H&P (Signed)
History and Physical    Grantham Hippert ZJI:[id number] DOB: [birth year] DOA: 11/09/2017  PCP: [name], MD  Patient coming from: Home  Chief Complaint: Abnormal labs  HPI: Lee Pratt is a 83 y.o. male with medical history significant of HTN, documented history of "arrhythmia" currently on cardizem who presents to the ED after found to have an "abnormal" lab value. Patient reports having markedly decreased by mouth intake secondary to anorexia approximately 6 weeks prior to hospital admission associated with approximately 10 pounds weight loss. 2 weeks prior to hospital admission, patient reported having nausea after eating foods, tolerating mainly liquid diet however not tolerating foods that are hot and temperature. Patient later had feelings of abdominal distention and increased gas. Patient presented to urgent care clinic on 11/08/2017 where routine blood work was obtained. During a visit, patient was noted to have dark stools with clinical signs suggesting orthostatic hypotension. On day of hospital admission, patient was told to report to the emergency department for further workup after apparently having found to have a hemoglobin of 8.5.  ED Course: In emergency department, follow-up hemoglobin noted be 7.8. Patient was found to be Hemoccult negative. Gastroenterology was consulted. Hospitalist service consulted for consideration for admission.  Review of Systems:  Review of Systems  Constitutional: Positive for malaise/fatigue and weight loss. Negative for chills and fever.  HENT: Negative for congestion, ear discharge, ear pain and nosebleeds.   Eyes: Negative for double vision, photophobia and pain.  Respiratory: Negative for hemoptysis, sputum production and shortness of breath.   Cardiovascular: Negative for palpitations, orthopnea and claudication.  Gastrointestinal: Positive for abdominal pain and nausea. Negative for constipation.  Genitourinary: Negative for frequency, hematuria and  urgency.  Musculoskeletal: Negative for back pain, joint pain and neck pain.  Neurological: Positive for weakness. Negative for tingling, tremors, focal weakness, seizures and loss of consciousness.  Psychiatric/Behavioral: Negative for hallucinations and substance abuse. The patient is not nervous/anxious.     Past Medical History:  Diagnosis Date  . Hyperglycemia 03/16/2013  . Hypertension     Past Surgical History:  Procedure Laterality Date  . NO PAST SURGERIES  12/21/2011     reports that  has never smoked. he has never used smokeless tobacco. He reports that he does not drink alcohol or use drugs.  No Known Allergies  Family history: Patient states wife has diabetes, otherwise no other medical problems known to run in family  Prior to Admission medications   Medication Sig Start Date End Date Taking? Authorizing Provider  aspirin EC 81 MG tablet Take 81 mg by mouth daily.   Yes [provider]  CARTIA XT 120 MG 24 hr capsule Take 120 mg daily as needed by mouth (Systolic BP > 175/10).  09/09/17  Yes [provider]  diltiazem (CARDIZEM) 120 MG tablet Take 1 tablet (120 mg total) by mouth daily. Patient not taking: Reported on 11/09/2017 06/22/13   Mosie Lukes, MD  losartan (COZAAR) 25 MG tablet Take 1 tablet (25 mg total) by mouth daily. Patient not taking: Reported on 11/09/2017 03/02/13 11/09/17  Mosie Lukes, MD    Physical Exam: Vitals:   11/09/17 0901 11/09/17 1302  BP: 118/67 100/60  Pulse: 90 78  Resp: 16 19  Temp: 97.8 F (36.6 C) 98.6 F (37 C)  TempSrc: Oral Oral  SpO2: 100% 100%    Constitutional: NAD, calm, comfortable Vitals:   11/09/17 0901 11/09/17 1302  BP: 118/67 100/60  Pulse: 90 78  Resp: 16 19  Temp: 97.8 F (36.6 C) 98.6 F (37 C)  TempSrc: Oral Oral  SpO2: 100% 100%   Eyes: PERRL, lids and conjunctivae normal ENMT: Mucous membranes are moist. Posterior pharynx clear of any exudate or lesions.Normal dentition.    Neck: normal, supple, no masses, no thyromegaly Respiratory: clear to auscultation bilaterally, no wheezing, no crackles. Normal respiratory effort. No accessory muscle use.  Cardiovascular: Regular rate and rhythm, no murmurs / rubs / gallops. No extremity edema. 2+ pedal pulses. No carotid bruits.  Abdomen: no tenderness, no masses palpated. No hepatosplenomegaly. Bowel sounds positive.  Musculoskeletal: no clubbing / cyanosis. No joint deformity upper and lower extremities. Good ROM, no contractures. Normal muscle tone.  Skin: no rashes, lesions, ulcers. No induration Neurologic: CN 2-12 grossly intact. Sensation intact, DTR normal. Strength 5/5 in all 4.  Psychiatric: Normal judgment and insight. Alert and oriented x 3. Normal mood.    Labs on Admission: I have personally reviewed following labs and imaging studies  CBC: Recent Labs  Lab 11/08/17 1603 11/09/17 0931  WBC 10.5* 8.2  HGB 8.5* 7.8*  HCT 26.1* 24.0*  MCV 89.3 88.9  PLT  --  381   Basic Metabolic Panel: Recent Labs  Lab 11/08/17 1656 11/09/17 0931  NA 134 134*  K 4.0 3.4*  CL 99 102  CO2 18* 23  GLUCOSE 125* 113*  BUN 22 20  CREATININE 1.04 1.05  CALCIUM 9.0 8.6*   GFR: Estimated Creatinine Clearance: 48.6 mL/min (by C-G formula based on SCr of 1.05 mg/dL). Liver Function Tests: Recent Labs  Lab 11/08/17 1656 11/09/17 0931  AST 23 28  ALT 13 16*  ALKPHOS 70 56  BILITOT 0.3 0.5  PROT 6.6 6.6  ALBUMIN 4.0 3.2*   Recent Labs  Lab 11/08/17 1656 11/09/17 0931  LIPASE 27 26   No results for input(s): AMMONIA in the last 168 hours. Coagulation Profile: No results for input(s): INR, PROTIME in the last 168 hours. Cardiac Enzymes: No results for input(s): CKTOTAL, CKMB, CKMBINDEX, TROPONINI in the last 168 hours. BNP (last 3 results) No results for input(s): PROBNP in the last 8760 hours. HbA1C: No results for input(s): HGBA1C in the last 72 hours. CBG: No results for input(s): GLUCAP in the  last 168 hours. Lipid Profile: No results for input(s): CHOL, HDL, LDLCALC, TRIG, CHOLHDL, LDLDIRECT in the last 72 hours. Thyroid Function Tests: No results for input(s): TSH, T4TOTAL, FREET4, T3FREE, THYROIDAB in the last 72 hours. Anemia Panel: No results for input(s): VITAMINB12, FOLATE, FERRITIN, TIBC, IRON, RETICCTPCT in the last 72 hours. Urine analysis:    Component Value Date/Time   COLORURINE YELLOW 11/09/2017 [place] 11/09/2017 1202   LABSPEC 1.006 11/09/2017 1202   PHURINE 6.0 11/09/2017 1202   GLUCOSEU NEGATIVE 11/09/2017 1202   HGBUR NEGATIVE 11/09/2017 1202   BILIRUBINUR NEGATIVE 11/09/2017 1202   KETONESUR 5 (A) 11/09/2017 1202   PROTEINUR NEGATIVE 11/09/2017 1202   NITRITE NEGATIVE 11/09/2017 1202   LEUKOCYTESUR NEGATIVE 11/09/2017 1202   Sepsis Labs: !!!!!!!!!!!!!!!!!!!!!!!!!!!!!!!!!!!!!!!!!!!! @LABRCNTIP (procalcitonin:4,lacticidven:4) )No results found for this or any previous visit (from the past 240 hour(s)).   Radiological Exams on Admission:  xrays personally reviewed Ct Abdomen Pelvis W Contrast  Result Date: 11/09/2017 CLINICAL DATA:  Hypotension and anemia. EXAM: CT ABDOMEN AND PELVIS WITH CONTRAST TECHNIQUE: Multidetector CT imaging of the abdomen and pelvis was performed using the standard protocol following bolus administration of intravenous contrast. CONTRAST:  100 cc Isovue 300 COMPARISON:  None. FINDINGS: Lower chest: Limited  visualization of the lower thorax demonstrates short-segment occlusion of left lower lobe lateral basal bronchi (image 19, series 7, coronal image 64, series 4). Minimal dependent atelectasis/scar. Punctate granuloma within the right lower lobe. No pleural effusion. Normal heart size. Coronary artery calcifications. Calcifications within the mitral valve leaflets. No pericardial effusion. Hepatobiliary: Normal hepatic contour. No discrete hepatic lesions. No intra extrahepatic biliary duct dilatation. No ascites.  Pancreas: Normal appearance of the pancreas Spleen: Normal appearance of the spleen Adrenals/Urinary Tract: There is symmetric enhancement and excretion of the bilateral kidneys. Note is made of a small right-sided extrarenal pelvis. No definite renal stones on this postcontrast examination. No urinary obstruction or perinephric stranding. Normal appearance the urinary bladder given underdistention. Normal appearance of the bilateral adrenal glands. Stomach/Bowel: There is circumferential wall thickening involving the gastric antrum (axial image 34, series 2, coronal image 43, series 4) with marked fluid distention of the stomach. The sigmoid colon is noted to be redundant. The hepatic flexure of the colon is noted to be interposed adjacent to the anterolateral aspect of the liver. The bowel is otherwise normal in course and caliber without wall thickening or evidence of enteric obstruction. Normal appearance of the terminal ileum and appendix. No pneumoperitoneum, pneumatosis or portal venous gas. Vascular/Lymphatic: There is a large amount of mixed calcified and noncalcified irregular atherosclerotic plaque throughout the abdominal aorta. The abdominal aorta is noted to be mildly ectatic measuring approximately 2.7 cm in greatest diameter. The IMA is not definitely identified. Irregular calcified plaque involves the origin of all the major branch vessels of the abdominal aorta however the major branch vessels appear patent. Suspected hemodynamically significant narrowings involving the bilateral external iliac arteries as well as the right common femoral artery (image 81, series 2). Reproductive: Normal sized prostate gland. Small amount of free fluid in the pelvic cul-de-sac. Other: Regional soft tissues appear normal. Musculoskeletal: No acute or aggressive osseous abnormalities. Stigmata of DISH within the lower thoracic spine. IMPRESSION: 1. Circumferential wall thickening involving the gastric antrum with  marked distension of the stomach suggestive of an element of gastric outlet obstruction. Further evaluation with endoscopy is recommended. 2. Short-segment occlusion involving the left lower lobe lateral basal subsegmental bronchus, nonspecific though could be indicative of aspiration in the setting of suspected gastric outlet obstruction. Clinical correlation is advised. 3. Small amount of fluid seen with the pelvic cul-de-sac, presumably reactive. 4. Mild ectasia of the abdominal aorta measuring 2.7 cm in diameter. Recommend follow-up aortic ultrasound in 5 years. This recommendation follows ACR consensus guidelines: White Paper of the ACR Incidental Findings Committee II on Vascular Findings. J Am Coll Radiol 2013; 96:295-284. 5. Aortic Atherosclerosis (ICD10-I70.0). Suspected hemodynamically significant stenoses involving the bilateral external iliac arteries and the right common femoral artery. Correlation for lower extremity PAD symptoms is recommended. Further evaluation with the acquisition of ABIs could be performed as indicated. Electronically Signed   By: Sandi Mariscal M.D.   On: 11/09/2017 12:22   Dg Abd Acute W/chest  Result Date: 11/08/2017 CLINICAL DATA:  Epigastric abdominal pain, vomiting and weight loss. EXAM: DG ABDOMEN ACUTE W/ 1V CHEST COMPARISON:  08/23/2015 chest radiograph. FINDINGS: Stable cardiomediastinal silhouette with normal heart size and aortic atherosclerosis. No pneumothorax. No pleural effusion. Stable right apical pleural-parenchymal scarring and calcification. Stable scattered granulomas at the periphery of the right lung. No pulmonary edema. No acute consolidative airspace disease. No dilated small bowel loops or significant air-fluid levels. Minimal stool and gas in the large bowel. No evidence of pneumatosis  or pneumoperitoneum. No radiopaque nephrolithiasis. Extensive iliofemoral atherosclerosis. Moderate lumbar spondylosis. IMPRESSION: 1. No active cardiopulmonary disease.  2. Nonobstructive bowel gas pattern. Electronically Signed   By: Ilona Sorrel M.D.   On: 11/08/2017 15:54    Assessment/Plan Principal Problem:   Acute blood loss anemia Active Problems:   HTN (hypertension)   Hyperlipidemia   Arrhythmia   1. Acute blood loss anemia 1. Presenting hgb of 7.8 (was 14.1 in 2014) 2. Stools are heme neg 3. GI consulted through ed, recommendation for medication admission and GI to see when admitted 2. HTN 1. BP stable 2. Cont home regimen 3. HLD 1. Stable at present 2. Cont home regimen as tolerated 4. Hx arrhythmia 1. Pt on cardizem prior to admit 2. Rate controlled. Will continue  DVT prophylaxis: SCD's  Code Status: Full Family Communication: Pt in room  Disposition Plan: Uncertain at this time  Consults called: GI Admission status: Observation, as would likely require less than 2 midnight stay to stabilize and work up anemia   CHIU, Orpah Melter MD [facility] Pager [phone]  If 7PM-7AM, please contact night-coverage [email or web address] Password [email or web address]  11/09/2017, 1:59 PM

## 2017-11-09 NOTE — ED Provider Notes (Signed)
[facility] Provider Note   CSN: [id number] Arrival date & time: 11/09/17  2202     History   Chief Complaint Chief Complaint  Patient presents with  . abnormal labs    HPI Lee Pratt is a 83 y.o. male.  HPI Patient is sent in from primary care office for low hemoglobin.  The patient reports for 6 weeks he has had decreased appetite.  For 2 weeks he reports that most foods have made him nauseated and he has been drinking soup but not eating solids.  He denies he is having any vomiting.  He reports sometimes food does give him epigastric pain.  He reports he has had some dark looking stool.  He denies lightheadedness, passing out or shortness of breath.  He also denies chest pain. Past Medical History:  Diagnosis Date  . Hyperglycemia 03/16/2013  . Hypertension     Patient Active Problem List   Diagnosis Date Noted  . Acute blood loss anemia 11/09/2017  . Hyperglycemia 03/16/2013  . Rash 10/21/2012  . Abnormal CXR 06/28/2012  . Chronic cough 05/19/2012  . HTN (hypertension) 12/21/2011  . Murmur 12/21/2011  . Hyperlipidemia 12/21/2011  . Arrhythmia 12/21/2011    Past Surgical History:  Procedure Laterality Date  . NO PAST SURGERIES  12/21/2011       Home Medications    Prior to Admission medications   Medication Sig Start Date End Date Taking? Authorizing Provider  aspirin EC 81 MG tablet Take 81 mg by mouth daily.   Yes [provider]  CARTIA XT 120 MG 24 hr capsule Take 120 mg daily as needed by mouth (Systolic BP > 542/70).  09/09/17  Yes [provider]  diltiazem (CARDIZEM) 120 MG tablet Take 1 tablet (120 mg total) by mouth daily. Patient not taking: Reported on 11/09/2017 06/22/13   Mosie Lukes, MD  losartan (COZAAR) 25 MG tablet Take 1 tablet (25 mg total) by mouth daily. Patient not taking: Reported on 11/09/2017 03/02/13 11/09/17  Mosie Lukes, MD    Family History No family history on  file.  Social History Social History   Tobacco Use  . Smoking status: Never Smoker  . Smokeless tobacco: Never Used  Substance Use Topics  . Alcohol use: No  . Drug use: No     Allergies   Patient has no known allergies.   Review of Systems Review of Systems 10 Systems reviewed and are negative for acute change except as noted in the HPI.   Physical Exam Updated Vital Signs BP 128/67 (BP Location: Left Arm)   Pulse 80   Temp 98.3 F (36.8 C) (Oral)   Resp 16   Ht 5\' 9"  (1.753 m)   Wt 59.2 kg (130 lb 8.2 oz)   SpO2 98%   BMI 19.27 kg/m   Physical Exam  Constitutional: He is oriented to person, place, and time. He appears well-developed and well-nourished. No distress.  Patient is thin but well-developed. no Respiratory distress clear mental status.  HENT:  Head: Normocephalic and atraumatic.  Nose: Nose normal.  Mouth/Throat: Oropharynx is clear and moist.  Eyes: EOM are normal. Pupils are equal, round, and reactive to light.  Conjunctivae are pale  Neck: Neck supple.  Cardiovascular: Normal rate, regular rhythm, normal heart sounds and intact distal pulses.  Pulmonary/Chest: Effort normal and breath sounds normal.  Abdominal: Soft. Bowel sounds are normal. He exhibits no distension. There is no tenderness.  There appears to  be some firmness in the epigastrium.  Patient is not endorsing pain.  Genitourinary:  Genitourinary Comments: Trace amount of brown stool in the vault.  No melena or red blood at this time.  Musculoskeletal: Normal range of motion. He exhibits no edema.  No peripheral edema.  Calves are soft and nontender.  Neurological: He is alert and oriented to person, place, and time. He has normal strength. No cranial nerve deficit. He exhibits normal muscle tone. Coordination normal. GCS eye subscore is 4. GCS verbal subscore is 5. GCS motor subscore is 6.  Skin: Skin is warm, dry and intact. There is pallor.  Psychiatric: He has a normal mood and  affect.     ED Treatments / Results  Labs (all labs ordered are listed, but only abnormal results are displayed) Labs Reviewed  COMPREHENSIVE METABOLIC PANEL - Abnormal; Notable for the following components:      Result Value   Sodium 134 (*)    Potassium 3.4 (*)    Glucose, Bld 113 (*)    Calcium 8.6 (*)    Albumin 3.2 (*)    ALT 16 (*)    All other components within normal limits  CBC - Abnormal; Notable for the following components:   RBC 2.70 (*)    Hemoglobin 7.8 (*)    HCT 24.0 (*)    All other components within normal limits  URINALYSIS, ROUTINE W REFLEX MICROSCOPIC - Abnormal; Notable for the following components:   Ketones, ur 5 (*)    All other components within normal limits  LIPASE, BLOOD  IRON AND TIBC  COMPREHENSIVE METABOLIC PANEL  CBC  MAGNESIUM  POC OCCULT BLOOD, ED  POC OCCULT BLOOD, ED  TYPE AND SCREEN  ABO/RH    EKG  EKG Interpretation None       Radiology Ct Abdomen Pelvis W Contrast  Result Date: 11/09/2017 CLINICAL DATA:  Hypotension and anemia. EXAM: CT ABDOMEN AND PELVIS WITH CONTRAST TECHNIQUE: Multidetector CT imaging of the abdomen and pelvis was performed using the standard protocol following bolus administration of intravenous contrast. CONTRAST:  100 cc Isovue 300 COMPARISON:  None. FINDINGS: Lower chest: Limited visualization of the lower thorax demonstrates short-segment occlusion of left lower lobe lateral basal bronchi (image 19, series 7, coronal image 64, series 4). Minimal dependent atelectasis/scar. Punctate granuloma within the right lower lobe. No pleural effusion. Normal heart size. Coronary artery calcifications. Calcifications within the mitral valve leaflets. No pericardial effusion. Hepatobiliary: Normal hepatic contour. No discrete hepatic lesions. No intra extrahepatic biliary duct dilatation. No ascites. Pancreas: Normal appearance of the pancreas Spleen: Normal appearance of the spleen Adrenals/Urinary Tract: There is  symmetric enhancement and excretion of the bilateral kidneys. Note is made of a small right-sided extrarenal pelvis. No definite renal stones on this postcontrast examination. No urinary obstruction or perinephric stranding. Normal appearance the urinary bladder given underdistention. Normal appearance of the bilateral adrenal glands. Stomach/Bowel: There is circumferential wall thickening involving the gastric antrum (axial image 34, series 2, coronal image 43, series 4) with marked fluid distention of the stomach. The sigmoid colon is noted to be redundant. The hepatic flexure of the colon is noted to be interposed adjacent to the anterolateral aspect of the liver. The bowel is otherwise normal in course and caliber without wall thickening or evidence of enteric obstruction. Normal appearance of the terminal ileum and appendix. No pneumoperitoneum, pneumatosis or portal venous gas. Vascular/Lymphatic: There is a large amount of mixed calcified and noncalcified irregular atherosclerotic plaque throughout the  abdominal aorta. The abdominal aorta is noted to be mildly ectatic measuring approximately 2.7 cm in greatest diameter. The IMA is not definitely identified. Irregular calcified plaque involves the origin of all the major branch vessels of the abdominal aorta however the major branch vessels appear patent. Suspected hemodynamically significant narrowings involving the bilateral external iliac arteries as well as the right common femoral artery (image 81, series 2). Reproductive: Normal sized prostate gland. Small amount of free fluid in the pelvic cul-de-sac. Other: Regional soft tissues appear normal. Musculoskeletal: No acute or aggressive osseous abnormalities. Stigmata of DISH within the lower thoracic spine. IMPRESSION: 1. Circumferential wall thickening involving the gastric antrum with marked distension of the stomach suggestive of an element of gastric outlet obstruction. Further evaluation with  endoscopy is recommended. 2. Short-segment occlusion involving the left lower lobe lateral basal subsegmental bronchus, nonspecific though could be indicative of aspiration in the setting of suspected gastric outlet obstruction. Clinical correlation is advised. 3. Small amount of fluid seen with the pelvic cul-de-sac, presumably reactive. 4. Mild ectasia of the abdominal aorta measuring 2.7 cm in diameter. Recommend follow-up aortic ultrasound in 5 years. This recommendation follows ACR consensus guidelines: White Paper of the ACR Incidental Findings Committee II on Vascular Findings. J Am Coll Radiol 2013; 72:536-644. 5. Aortic Atherosclerosis (ICD10-I70.0). Suspected hemodynamically significant stenoses involving the bilateral external iliac arteries and the right common femoral artery. Correlation for lower extremity PAD symptoms is recommended. Further evaluation with the acquisition of ABIs could be performed as indicated. Electronically Signed   By: Sandi Mariscal M.D.   On: 11/09/2017 12:22   Dg Abd Acute W/chest  Result Date: 11/08/2017 CLINICAL DATA:  Epigastric abdominal pain, vomiting and weight loss. EXAM: DG ABDOMEN ACUTE W/ 1V CHEST COMPARISON:  08/23/2015 chest radiograph. FINDINGS: Stable cardiomediastinal silhouette with normal heart size and aortic atherosclerosis. No pneumothorax. No pleural effusion. Stable right apical pleural-parenchymal scarring and calcification. Stable scattered granulomas at the periphery of the right lung. No pulmonary edema. No acute consolidative airspace disease. No dilated small bowel loops or significant air-fluid levels. Minimal stool and gas in the large bowel. No evidence of pneumatosis or pneumoperitoneum. No radiopaque nephrolithiasis. Extensive iliofemoral atherosclerosis. Moderate lumbar spondylosis. IMPRESSION: 1. No active cardiopulmonary disease. 2. Nonobstructive bowel gas pattern. Electronically Signed   By: Ilona Sorrel M.D.   On: 11/08/2017 15:54     Procedures Procedures (including critical care time)  Medications Ordered in ED Medications  0.9 %  sodium chloride infusion ( Intravenous New Bag/Given 11/09/17 1536)  ondansetron (ZOFRAN) tablet 4 mg (not administered)    Or  ondansetron (ZOFRAN) injection 4 mg (not administered)  pantoprazole (PROTONIX) injection 40 mg (40 mg Intravenous Given 11/09/17 1536)  diltiazem (CARDIZEM CD) 24 hr capsule 120 mg (not administered)  iopamidol (ISOVUE-300) 61 % injection (100 mLs Intravenous Contrast Given 11/09/17 1154)  potassium chloride SA (K-DUR,KLOR-CON) CR tablet 40 mEq (40 mEq Oral Given 11/09/17 1624)     Initial Impression / Assessment and Plan / ED Course  I have reviewed the triage vital signs and the nursing notes.  Pertinent labs & imaging results that were available during my care of the patient were reviewed by me and considered in my medical decision making (see chart for details).     Consult: Gastroenterology Dr. Germain Osgood.  Will consult on the patient during admission to schedule endoscopy. Consult: [facility] hospitalist Dr. Wyline Copas for admission. Final Clinical Impressions(s) / ED Diagnoses   Final diagnoses:  Partial gastric outlet obstruction  Symptomatic anemia  Patient is alert and appropriate.  No respiratory distress.  CT scan identifies significant gastric thickening and partial outlet obstruction.  Patient also is significantly anemic but tolerating it well.  Patient is able to ambulate and maintains clear mental status and stable respiratory status.  He has no longer been able to eat solid foods for 2 weeks duration.  Plan will be for admission for further diagnostic evaluation and treatment.  ED Discharge Orders    None       Charlesetta Shanks, MD 11/09/17 1701

## 2017-11-10 ENCOUNTER — Encounter (HOSPITAL_COMMUNITY)
Admission: EM | Disposition: A | Discharge status: Home Health | Payer: Self-pay | Source: Home / Self Care | Attending: Internal Medicine

## 2017-11-10 ENCOUNTER — Encounter (HOSPITAL_COMMUNITY): Payer: Self-pay

## 2017-11-10 DIAGNOSIS — D509 Iron deficiency anemia, unspecified: Secondary | ICD-10-CM | POA: Diagnosis present

## 2017-11-10 DIAGNOSIS — K311 Adult hypertrophic pyloric stenosis: Secondary | ICD-10-CM

## 2017-11-10 DIAGNOSIS — E440 Moderate protein-calorie malnutrition: Secondary | ICD-10-CM | POA: Diagnosis not present

## 2017-11-10 DIAGNOSIS — G540 Brachial plexus disorders: Secondary | ICD-10-CM | POA: Diagnosis present

## 2017-11-10 DIAGNOSIS — Z86010 Personal history of colonic polyps: Secondary | ICD-10-CM | POA: Diagnosis not present

## 2017-11-10 DIAGNOSIS — K2970 Gastritis, unspecified, without bleeding: Secondary | ICD-10-CM | POA: Diagnosis not present

## 2017-11-10 DIAGNOSIS — Z452 Encounter for adjustment and management of vascular access device: Secondary | ICD-10-CM | POA: Diagnosis not present

## 2017-11-10 DIAGNOSIS — R933 Abnormal findings on diagnostic imaging of other parts of digestive tract: Secondary | ICD-10-CM | POA: Diagnosis not present

## 2017-11-10 DIAGNOSIS — D62 Acute posthemorrhagic anemia: Secondary | ICD-10-CM

## 2017-11-10 DIAGNOSIS — Z7982 Long term (current) use of aspirin: Secondary | ICD-10-CM | POA: Diagnosis not present

## 2017-11-10 DIAGNOSIS — C169 Malignant neoplasm of stomach, unspecified: Principal | ICD-10-CM

## 2017-11-10 DIAGNOSIS — E43 Unspecified severe protein-calorie malnutrition: Secondary | ICD-10-CM | POA: Diagnosis not present

## 2017-11-10 DIAGNOSIS — K210 Gastro-esophageal reflux disease with esophagitis: Secondary | ICD-10-CM

## 2017-11-10 DIAGNOSIS — R109 Unspecified abdominal pain: Secondary | ICD-10-CM | POA: Diagnosis not present

## 2017-11-10 DIAGNOSIS — Z79899 Other long term (current) drug therapy: Secondary | ICD-10-CM | POA: Diagnosis not present

## 2017-11-10 DIAGNOSIS — R918 Other nonspecific abnormal finding of lung field: Secondary | ICD-10-CM | POA: Diagnosis not present

## 2017-11-10 DIAGNOSIS — R1310 Dysphagia, unspecified: Secondary | ICD-10-CM | POA: Diagnosis not present

## 2017-11-10 DIAGNOSIS — D490 Neoplasm of unspecified behavior of digestive system: Secondary | ICD-10-CM | POA: Diagnosis not present

## 2017-11-10 DIAGNOSIS — R5381 Other malaise: Secondary | ICD-10-CM | POA: Diagnosis not present

## 2017-11-10 DIAGNOSIS — R100 Acute abdomen: Secondary | ICD-10-CM | POA: Diagnosis not present

## 2017-11-10 DIAGNOSIS — R6881 Early satiety: Secondary | ICD-10-CM | POA: Diagnosis present

## 2017-11-10 DIAGNOSIS — Z806 Family history of leukemia: Secondary | ICD-10-CM | POA: Diagnosis not present

## 2017-11-10 DIAGNOSIS — R319 Hematuria, unspecified: Secondary | ICD-10-CM | POA: Diagnosis not present

## 2017-11-10 DIAGNOSIS — R935 Abnormal findings on diagnostic imaging of other abdominal regions, including retroperitoneum: Secondary | ICD-10-CM | POA: Diagnosis not present

## 2017-11-10 DIAGNOSIS — E876 Hypokalemia: Secondary | ICD-10-CM | POA: Diagnosis not present

## 2017-11-10 DIAGNOSIS — E785 Hyperlipidemia, unspecified: Secondary | ICD-10-CM | POA: Diagnosis present

## 2017-11-10 DIAGNOSIS — I499 Cardiac arrhythmia, unspecified: Secondary | ICD-10-CM | POA: Diagnosis present

## 2017-11-10 DIAGNOSIS — R110 Nausea: Secondary | ICD-10-CM

## 2017-11-10 DIAGNOSIS — R630 Anorexia: Secondary | ICD-10-CM | POA: Diagnosis not present

## 2017-11-10 DIAGNOSIS — I10 Essential (primary) hypertension: Secondary | ICD-10-CM | POA: Diagnosis not present

## 2017-11-10 DIAGNOSIS — D649 Anemia, unspecified: Secondary | ICD-10-CM | POA: Diagnosis not present

## 2017-11-10 DIAGNOSIS — R634 Abnormal weight loss: Secondary | ICD-10-CM

## 2017-11-10 DIAGNOSIS — C801 Malignant (primary) neoplasm, unspecified: Secondary | ICD-10-CM | POA: Diagnosis not present

## 2017-11-10 DIAGNOSIS — C786 Secondary malignant neoplasm of retroperitoneum and peritoneum: Secondary | ICD-10-CM | POA: Diagnosis not present

## 2017-11-10 DIAGNOSIS — Z681 Body mass index (BMI) 19 or less, adult: Secondary | ICD-10-CM | POA: Diagnosis not present

## 2017-11-10 HISTORY — PX: ESOPHAGOGASTRODUODENOSCOPY: SHX5428

## 2017-11-10 LAB — CBC
HCT: 21.1 % — ABNORMAL LOW (ref 39.0–52.0)
HEMOGLOBIN: 7 g/dL — AB (ref 13.0–17.0)
MCH: 29.3 pg (ref 26.0–34.0)
MCHC: 33.2 g/dL (ref 30.0–36.0)
MCV: 88.3 fL (ref 78.0–100.0)
PLATELETS: 292 10*3/uL (ref 150–400)
RBC: 2.39 MIL/uL — AB (ref 4.22–5.81)
RDW: 14.1 % (ref 11.5–15.5)
WBC: 5.4 10*3/uL (ref 4.0–10.5)

## 2017-11-10 LAB — MAGNESIUM: MAGNESIUM: 2.1 mg/dL (ref 1.7–2.4)

## 2017-11-10 LAB — COMPREHENSIVE METABOLIC PANEL
ALBUMIN: 2.7 g/dL — AB (ref 3.5–5.0)
ALT: 14 U/L — ABNORMAL LOW (ref 17–63)
AST: 20 U/L (ref 15–41)
Alkaline Phosphatase: 49 U/L (ref 38–126)
Anion gap: 10 (ref 5–15)
BUN: 18 mg/dL (ref 6–20)
CALCIUM: 8.1 mg/dL — AB (ref 8.9–10.3)
CHLORIDE: 106 mmol/L (ref 101–111)
CO2: 21 mmol/L — AB (ref 22–32)
Creatinine, Ser: 0.92 mg/dL (ref 0.61–1.24)
GFR calc Af Amer: >60 mL/min (ref >60)
GFR calc non Af Amer: >60 mL/min (ref >60)
GLUCOSE: 86 mg/dL (ref 65–99)
POTASSIUM: 3.8 mmol/L (ref 3.5–5.1)
SODIUM: 137 mmol/L (ref 135–145)
TOTAL PROTEIN: 5.5 g/dL — AB (ref 6.5–8.1)
Total Bilirubin: 0.8 mg/dL (ref 0.3–1.2)

## 2017-11-10 LAB — PREPARE RBC (CROSSMATCH)

## 2017-11-10 SURGERY — EGD (ESOPHAGOGASTRODUODENOSCOPY)
Anesthesia: Moderate Sedation

## 2017-11-10 MED ORDER — MIDAZOLAM HCL 10 MG/2ML IJ SOLN
INTRAMUSCULAR | Status: DC | PRN
  Administered 2017-11-10 (×3): 1 mg via INTRAVENOUS

## 2017-11-10 MED ORDER — BUTAMBEN-TETRACAINE-BENZOCAINE 2-2-14 % EX AERO
INHALATION_SPRAY | CUTANEOUS | Status: DC | PRN
  Administered 2017-11-10: 2 via TOPICAL

## 2017-11-10 MED ORDER — SODIUM CHLORIDE 0.9 % IV SOLN: Freq: Once | INTRAVENOUS | Status: AC

## 2017-11-10 MED ORDER — FENTANYL CITRATE (PF) 100 MCG/2ML IJ SOLN
INTRAMUSCULAR | Status: AC
  Filled 2017-11-10: qty 2

## 2017-11-10 MED ORDER — FENTANYL CITRATE (PF) 100 MCG/2ML IJ SOLN
INTRAMUSCULAR | Status: DC | PRN
  Administered 2017-11-10 (×2): 12.5 ug via INTRAVENOUS

## 2017-11-10 MED ORDER — MIDAZOLAM HCL 5 MG/ML IJ SOLN
INTRAMUSCULAR | Status: AC
  Filled 2017-11-10: qty 1

## 2017-11-10 NOTE — Op Note (Addendum)
[facility] Patient Name: Lee Pratt Procedure Date: 11/10/2017 MRN: [id number] Attending MD: Jerene Bears , MD Date of Birth: 05-20-1934 CSN: [id number] Age: 83 Admit Type: Inpatient Procedure:                Upper GI endoscopy Indications:              Abnormal CT of the GI tract, Early satiety, Nausea,                            Weight loss Providers:                Lajuan Lines. Hilarie Fredrickson, MD, Zenon Mayo, RN, Cherylynn Ridges,                            Technician Referring MD:             [facility] Medicines:                Fentanyl 25 micrograms IV, Midazolam 3 mg IV,                            Cetacaine spray Complications:            No immediate complications. Estimated Blood Loss:     Estimated blood loss was minimal. Procedure:                Pre-Anesthesia Assessment:                           - Prior to the procedure, a History and Physical                            was performed, and patient medications and                            allergies were reviewed. The patient's tolerance of                            previous anesthesia was also reviewed. The risks                            and benefits of the procedure and the sedation                            options and risks were discussed with the patient.                            All questions were answered, and informed consent                            was obtained. Prior Anticoagulants: The patient has                            taken no previous anticoagulant or antiplatelet  agents. ASA Grade Assessment: III - A patient with                            severe systemic disease. After reviewing the risks                            and benefits, the patient was deemed in                            satisfactory condition to undergo the procedure.                           After obtaining informed consent, the endoscope was                            passed under direct  vision. Throughout the                            procedure, the patient's blood pressure, pulse, and                            oxygen saturations were monitored continuously. The                            [id number] ([id number]) scope was introduced through the                            mouth, and advanced to the antrum of the stomach.                            The upper GI endoscopy was technically difficult                            and complex due to presence of food and a partially                            obstructing mass. The patient tolerated the                            procedure well. Scope In: Scope Out: Findings:      Mild esophagitis with no bleeding was found in the lower third of the       esophagus. This is likely from reflux in the setting of partial gastric       outlet obstruction.      Retained fluid and food debris was found in the gastric body. This was       aspirated via the suction channel of the endoscope but was unable to be       fully cleared due to semi-solid material. 1800 mL was removed.      Diffuse moderate inflammation characterized by erythema and granularity       was found in the cardia, in the gastric fundus and in the gastric body.      A large, fungating and friable mass with no active bleeding was found at  the incisura and in the gastric antrum. The mass was covered with mucus.       This was biopsied extensively with a cold forceps for histology. Attempt       made to maneuver around this mass and find the pylorus/duodenum, however       given retained food/food in the stomach and lack of airway protection,       this was not possible.      An examination of the duodenum was not performed. Impression:               - Mild reflux esophagitis.                           - Retained gastric fluid/food. 1800 mL cleared via                            the endoscope, however semi-solid material remains.                           - Gastritis.                            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 Multiple biopsies.                           - Duodenum not examined. Moderate Sedation:      Moderate (conscious) sedation was administered by the endoscopy nurse       and supervised by the endoscopist. The following parameters were       monitored: oxygen saturation, heart rate, blood pressure, and response       to care. Total physician intraservice time was 22 minutes. Recommendation:           - Return patient to hospital ward for ongoing care.                           - Clear liquid diet.                           - Continue present medications. Change all possible                            meds to IV for now.                           - Await pathology results.                           - Surgical consultation. Procedure Code(s):        --- Professional ---  26712, 52, Esophagogastroduodenoscopy, flexible,                            transoral; with biopsy, single or multiple                           99152, Moderate sedation services provided by the                            same physician or other qualified health care                            professional performing the diagnostic or                            therapeutic service that the sedation supports,                            requiring the presence of an independent trained                            observer to assist in the monitoring of the                            patient's level of consciousness and physiological                            status; initial 15 minutes of intraservice time,                            patient age 62 years or older Diagnosis Code(s):        --- Professional ---                           K21.0, Gastro-esophageal reflux disease with                             esophagitis                           K29.70, Gastritis, unspecified, without bleeding                           C16.8, Malignant neoplasm of overlapping sites of                            stomach                           C16.3, Malignant neoplasm of pyloric antrum                           R68.81, Early satiety                           R11.0, Nausea  R63.4, Abnormal weight loss                           R93.3, Abnormal findings on diagnostic imaging of                            other parts of digestive tract CPT copyright 2016 [employer] Association. All rights reserved. The codes documented in this report are preliminary and upon coder review may  be revised to meet current compliance requirements. Jerene Bears, MD 11/10/2017 12:38:47 PM This report has been signed electronically. Number of Addenda: 0

## 2017-11-10 NOTE — Progress Notes (Signed)
EGD done, see report Large antrum mass, apparently malignant.  Biopsied.  I shared the findings with the patient, and he asked that I not discuss this with family at present.  He states that his wife is "sick" at home.  There was no family with him in endo today.  We will need to review the EGD findings with him again tomorrow once he has cleared the sedation.  He was awake and voiced understanding today, but with Versed, he may not remember our conversation.  Once biopsies back he will need oncology involvement and surgical involvement for resection verus diversion.

## 2017-11-10 NOTE — Progress Notes (Signed)
Patient ID: Lee Pratt, male   DOB: 09-Dec-1934, 83 y.o.   MRN: [id number]    PROGRESS NOTE  Esiquio Boesen  DGL:[id number] DOB: [birth year] DOA: 11/09/2017  PCP: [name], MD   Brief Narrative:  Patient is 83 year old male who presented from primary care physician office for further evaluation of significant anemia, hemoglobin 8.5. Patient reports approximately 10 pound weight loss in the past 6 weeks associated with poor oral intake, malaise and fatigue. Patient reports several days duration of progressively worsening nausea and abdominal bloating but denies any vomiting.  In emergency department, hemoglobin 7.8, FOBT negative, patient was orthostatic. CT scan notable for circumferential wall thickening involving the gastric antrum and marked distention of the stomach suggestive of an element of gastric outlet obstruction, recommendation for endoscopy.  Assessment & Plan:   Principal Problem:   Acute blood loss anemia, GI bleed, question gastric outlet obstruction - GI team consulted, assistance appreciated - Plan for endoscopy today - Keep nothing by mouth - Quarter 2 units PRBC for transfusion - Further recommendations pending EGD results - CBC in the morning   Active Problems:   HTN (hypertension) - Reasonable inpatient control  DVT prophylaxis:  SCDs  Code Status:  full code  Family Communication: Patient at bedside  Disposition Plan: To be determined   Consultants:   GI   Procedures:   EGD 11/10/2017  Antimicrobials:   None  Subjective: Patient reports ongoing nausea, no vomiting.  Objective: Vitals:   11/09/17 1612 11/09/17 1643 11/09/17 2114 11/10/17 0659  BP: 128/67  117/69 (!) 129/53  Pulse: 80  79 79  Resp: 16  20 20   Temp: 98.3 F (36.8 C)  98.2 F (36.8 C) 98.4 F (36.9 C)  TempSrc: Oral  Oral Oral  SpO2: 98%  99% 99%  Weight: 59.2 kg (130 lb 8.2 oz)     Height:  5\' 9"  (1.753 m)      Intake/Output Summary (Last 24 hours) at 11/10/2017 0906 Last data  filed at 11/10/2017 0600 Gross per 24 hour  Intake 1080 ml  Output -  Net 1080 ml   Filed Weights   11/09/17 1612  Weight: 59.2 kg (130 lb 8.2 oz)    Examination:  General exam: Appears calm and comfortable  Respiratory system: Clear to auscultation. Respiratory effort normal. Cardiovascular system: S1 & S2 heard, RRR. No JVD, murmurs, rubs, gallops or clicks. No pedal edema. Gastrointestinal system: Abdomen is nondistended, soft and nontender. No organomegaly or masses felt.  Central nervous system: Alert and oriented. No focal neurological deficits.   Data Reviewed: I have personally reviewed following labs and imaging studies  CBC: Recent Labs  Lab 11/08/17 1603 11/09/17 0931 11/10/17 0504  WBC 10.5* 8.2 5.4  HGB 8.5* 7.8* 7.0*  HCT 26.1* 24.0* 21.1*  MCV 89.3 88.9 88.3  PLT  --  395 518   Basic Metabolic Panel: Recent Labs  Lab 11/08/17 1656 11/09/17 0931 11/10/17 0504  NA 134 134* 137  K 4.0 3.4* 3.8  CL 99 102 106  CO2 18* 23 21*  GLUCOSE 125* 113* 86  BUN 22 20 18   CREATININE 1.04 1.05 0.92  CALCIUM 9.0 8.6* 8.1*  MG  --   --  2.1   Liver Function Tests: Recent Labs  Lab 11/08/17 1656 11/09/17 0931 11/10/17 0504  AST 23 28 20   ALT 13 16* 14*  ALKPHOS 70 56 49  BILITOT 0.3 0.5 0.8  PROT 6.6 6.6 5.5*  ALBUMIN 4.0 3.2* 2.7*  Recent Labs  Lab 11/08/17 1656 11/09/17 0931  LIPASE 27 26   Anemia Panel: Recent Labs    11/09/17 1633  TIBC 377  IRON 11*   Urine analysis:    Component Value Date/Time   COLORURINE YELLOW 11/09/2017 [place] 11/09/2017 1202   LABSPEC 1.006 11/09/2017 1202   PHURINE 6.0 11/09/2017 1202   GLUCOSEU NEGATIVE 11/09/2017 1202   HGBUR NEGATIVE 11/09/2017 1202   BILIRUBINUR NEGATIVE 11/09/2017 1202   KETONESUR 5 (A) 11/09/2017 1202   PROTEINUR NEGATIVE 11/09/2017 1202   NITRITE NEGATIVE 11/09/2017 [place] 11/09/2017 1202   Radiology Studies: Ct Abdomen Pelvis W  Contrast  Result Date: 11/09/2017 CLINICAL DATA:  Hypotension and anemia. EXAM: CT ABDOMEN AND PELVIS WITH CONTRAST TECHNIQUE: Multidetector CT imaging of the abdomen and pelvis was performed using the standard protocol following bolus administration of intravenous contrast. CONTRAST:  100 cc Isovue 300 COMPARISON:  None. FINDINGS: Lower chest: Limited visualization of the lower thorax demonstrates short-segment occlusion of left lower lobe lateral basal bronchi (image 19, series 7, coronal image 64, series 4). Minimal dependent atelectasis/scar. Punctate granuloma within the right lower lobe. No pleural effusion. Normal heart size. Coronary artery calcifications. Calcifications within the mitral valve leaflets. No pericardial effusion. Hepatobiliary: Normal hepatic contour. No discrete hepatic lesions. No intra extrahepatic biliary duct dilatation. No ascites. Pancreas: Normal appearance of the pancreas Spleen: Normal appearance of the spleen Adrenals/Urinary Tract: There is symmetric enhancement and excretion of the bilateral kidneys. Note is made of a small right-sided extrarenal pelvis. No definite renal stones on this postcontrast examination. No urinary obstruction or perinephric stranding. Normal appearance the urinary bladder given underdistention. Normal appearance of the bilateral adrenal glands. Stomach/Bowel: There is circumferential wall thickening involving the gastric antrum (axial image 34, series 2, coronal image 43, series 4) with marked fluid distention of the stomach. The sigmoid colon is noted to be redundant. The hepatic flexure of the colon is noted to be interposed adjacent to the anterolateral aspect of the liver. The bowel is otherwise normal in course and caliber without wall thickening or evidence of enteric obstruction. Normal appearance of the terminal ileum and appendix. No pneumoperitoneum, pneumatosis or portal venous gas. Vascular/Lymphatic: There is a large amount of mixed  calcified and noncalcified irregular atherosclerotic plaque throughout the abdominal aorta. The abdominal aorta is noted to be mildly ectatic measuring approximately 2.7 cm in greatest diameter. The IMA is not definitely identified. Irregular calcified plaque involves the origin of all the major branch vessels of the abdominal aorta however the major branch vessels appear patent. Suspected hemodynamically significant narrowings involving the bilateral external iliac arteries as well as the right common femoral artery (image 81, series 2). Reproductive: Normal sized prostate gland. Small amount of free fluid in the pelvic cul-de-sac. Other: Regional soft tissues appear normal. Musculoskeletal: No acute or aggressive osseous abnormalities. Stigmata of DISH within the lower thoracic spine. IMPRESSION: 1. Circumferential wall thickening involving the gastric antrum with marked distension of the stomach suggestive of an element of gastric outlet obstruction. Further evaluation with endoscopy is recommended. 2. Short-segment occlusion involving the left lower lobe lateral basal subsegmental bronchus, nonspecific though could be indicative of aspiration in the setting of suspected gastric outlet obstruction. Clinical correlation is advised. 3. Small amount of fluid seen with the pelvic cul-de-sac, presumably reactive. 4. Mild ectasia of the abdominal aorta measuring 2.7 cm in diameter. Recommend follow-up aortic ultrasound in 5 years. This recommendation follows ACR consensus guidelines:  White Paper of the ACR Incidental Findings Committee II on Vascular Findings. J Am Coll Radiol 2013; 48:016-553. 5. Aortic Atherosclerosis (ICD10-I70.0). Suspected hemodynamically significant stenoses involving the bilateral external iliac arteries and the right common femoral artery. Correlation for lower extremity PAD symptoms is recommended. Further evaluation with the acquisition of ABIs could be performed as indicated. Electronically  Signed   By: Sandi Mariscal M.D.   On: 11/09/2017 12:22   Dg Abd Acute W/chest  Result Date: 11/08/2017 CLINICAL DATA:  Epigastric abdominal pain, vomiting and weight loss. EXAM: DG ABDOMEN ACUTE W/ 1V CHEST COMPARISON:  08/23/2015 chest radiograph. FINDINGS: Stable cardiomediastinal silhouette with normal heart size and aortic atherosclerosis. No pneumothorax. No pleural effusion. Stable right apical pleural-parenchymal scarring and calcification. Stable scattered granulomas at the periphery of the right lung. No pulmonary edema. No acute consolidative airspace disease. No dilated small bowel loops or significant air-fluid levels. Minimal stool and gas in the large bowel. No evidence of pneumatosis or pneumoperitoneum. No radiopaque nephrolithiasis. Extensive iliofemoral atherosclerosis. Moderate lumbar spondylosis. IMPRESSION: 1. No active cardiopulmonary disease. 2. Nonobstructive bowel gas pattern. Electronically Signed   By: Ilona Sorrel M.D.   On: 11/08/2017 15:54   Scheduled Meds: . pantoprazole (PROTONIX) IV  40 mg Intravenous Q12H   Continuous Infusions: . sodium chloride 75 mL/hr at 11/10/17 0417     LOS: 0 days    Time spent: 25 minutes    Faye Ramsay, MD [facility] Pager [phone]  If 7PM-7AM, please contact night-coverage [email or web address] Password [email or web address] 11/10/2017, 9:06 AM

## 2017-11-10 NOTE — Consult Note (Signed)
Consultation  Referring Provider:  [facility] / Dr MyersPrimary Care Physician:  [name], MD Primary Gastroenterologist:  none.  Reason for Consultation:  Nausea, weight loss, anemia, and abnormal CT  HPI: Lee Pratt is a 83 y.o. male is admitted through the emergency room yesterday after finding of abnormal labs by his PCP with a significant anemia and hemoglobin of 8.5. Patient had been complaining of poor intake and lack of appetite over the past 6 weeks which is been associated with a 10 pound weight loss. Over the past couple of weeks he has had nausea and abdominal bloating with by mouth intake and has been primarily eating soup because he says any solid food seems to cause increase in symptoms. He denies any abdominal pain, just complains of bloating. Has not had any vomiting. No fever or chills. He has noted dark stools over the past couple of weeks. In the ER hemoglobin was 7.8, he was Hemoccult negative. He was orthostatic. CT imaging done yesterday shows circumferential wall thickening involving the gastric antrum with marked distention of the stomach suggestive of an element of gastric outlet obstruction. Was a short segment occlusion involving the left lower lobe lateral basal cell of segmental bronchus nonspecific, and aortic atherosclerotic disease with suspected hemodynamically significant stenoses involving the bilateral external iliac arteries and the right common femoral.  Patient has not had any prior abdominal surgeries, he is been healthy with history of hypertension. He stated that he had not had any prior endoscopy or colonoscopy but there is a report from colonoscopy done in 2011 while he was living in [place] at which time he had removal of a colon polyp.     Past Medical History:  Diagnosis Date  . Hyperglycemia 03/16/2013  . Hypertension     Past Surgical History:  Procedure Laterality Date  . NO PAST SURGERIES  12/21/2011    Prior to Admission  medications   Medication Sig Start Date End Date Taking? Authorizing Provider  aspirin EC 81 MG tablet Take 81 mg by mouth daily.   Yes [provider]  CARTIA XT 120 MG 24 hr capsule Take 120 mg daily as needed by mouth (Systolic BP > 536/14).  09/09/17  Yes [provider]  diltiazem (CARDIZEM) 120 MG tablet Take 1 tablet (120 mg total) by mouth daily. Patient not taking: Reported on 11/09/2017 06/22/13   Mosie Lukes, MD  losartan (COZAAR) 25 MG tablet Take 1 tablet (25 mg total) by mouth daily. Patient not taking: Reported on 11/09/2017 03/02/13 11/09/17  Mosie Lukes, MD    Current Facility-Administered Medications  Medication Dose Route Frequency Provider Last Rate Last Dose  . 0.9 %  sodium chloride infusion   Intravenous Continuous Donne Hazel, MD 75 mL/hr at 11/10/17 0417    . 0.9 %  sodium chloride infusion   Intravenous Once Theodis Blaze, MD      . diltiazem (CARDIZEM CD) 24 hr capsule 120 mg  120 mg Oral Daily PRN Donne Hazel, MD      . MUSCLE RUB CREA 1 application  1 application Topical PRN Donne Hazel, MD   1 application at 43/15/40 2015  . ondansetron (ZOFRAN) tablet 4 mg  4 mg Oral Q6H PRN Donne Hazel, MD       Or  . ondansetron [facility]) injection 4 mg  4 mg Intravenous Q6H PRN Donne Hazel, MD      . pantoprazole (PROTONIX) injection 40  mg  40 mg Intravenous Q12H Donne Hazel, MD   40 mg at 11/09/17 2133  . polyvinyl alcohol (LIQUIFILM TEARS) 1.4 % ophthalmic solution 1 drop  1 drop Both Eyes PRN Donne Hazel, MD   1 drop at 11/09/17 2015    Allergies as of 11/09/2017  . (No Known Allergies)    No family history on file.  Social History   Socioeconomic History  . Marital status: Married    Spouse name: Not on file  . Number of children: Not on file  . Years of education: Not on file  . Highest education level: Not on file  Social Needs  . Financial resource strain: Not on file  . Food insecurity - worry: Not on  file  . Food insecurity - inability: Not on file  . Transportation needs - medical: Not on file  . Transportation needs - non-medical: Not on file  Occupational History  . Not on file  Tobacco Use  . Smoking status: Never Smoker  . Smokeless tobacco: Never Used  Substance and Sexual Activity  . Alcohol use: No  . Drug use: No  . Sexual activity: Not Currently  Other Topics Concern  . Not on file  Social History Narrative  . Not on file    Review of Systems: Pertinent positive and negative review of systems were noted in the above HPI section.  All other review of systems was otherwise negative.  Physical Exam: Vital signs in last 24 hours: Temp:  [98.2 F (36.8 C)-98.6 F (37 C)] 98.4 F (36.9 C) (11/11 0659) Pulse Rate:  [78-80] 79 (11/11 0659) Resp:  [16-20] 20 (11/11 0659) BP: (100-129)/(53-69) 129/53 (11/11 0659) SpO2:  [98 %-100 %] 99 % (11/11 0659) Weight:  [130 lb 8.2 oz (59.2 kg)] 130 lb 8.2 oz (59.2 kg) (11/10 1612) Last BM Date: 11/08/17 General:   Alert,  Well-developed, elderly  Asian male well-nourished, pleasant and cooperative in NAD Head:  Normocephalic and atraumatic. Eyes:  Sclera clear, no icterus.   Conjunctiva pale. Ears:  Normal auditory acuity. Nose:  No deformity, discharge,  or lesions. Mouth:  No deformity or lesions.   Neck:  Supple; no masses or thyromegaly. Lungs:  Clear throughout to auscultation.   No wheezes, crackles, or rhonchi. Heart:  Regular rate and rhythm; no murmurs, clicks, rubs,  or gallops. Abdomen:  Soft,nontender, BS active, no definite splash,nonpalp mass or hsm.   Rectal:  Deferred  Msk:  Symmetrical without gross deformities. . Pulses:  Normal pulses noted. Extremities:  Without clubbing or edema. Neurologic:  Alert and  oriented x4;  grossly normal neurologically. Skin:  Intact without significant lesions or rashes.. Psych:  Alert and cooperative. Normal mood and affect.  Intake/Output from previous day: 11/10 0701 -  11/11 0700 In: 1080 [I.V.:1080] Out: -  Intake/Output this shift: No intake/output data recorded.  Lab Results: Recent Labs    11/08/17 1603 11/09/17 0931 11/10/17 0504  WBC 10.5* 8.2 5.4  HGB 8.5* 7.8* 7.0*  HCT 26.1* 24.0* 21.1*  PLT  --  395 292   BMET Recent Labs    11/08/17 1656 11/09/17 0931 11/10/17 0504  NA 134 134* 137  K 4.0 3.4* 3.8  CL 99 102 106  CO2 18* 23 21*  GLUCOSE 125* 113* 86  BUN 22 20 18   CREATININE 1.04 1.05 0.92  CALCIUM 9.0 8.6* 8.1*   LFT Recent Labs    11/10/17 0504  PROT 5.5*  ALBUMIN 2.7*  AST  20  ALT 14*  ALKPHOS 49  BILITOT 0.8   PT/INR No results for input(s): LABPROT, INR in the last 72 hours. Hepatitis Panel No results for input(s): HEPBSAG, HCVAB, HEPAIGM, HEPBIGM in the last 72 hours.    IMPRESSION:  #88 83 year old Asian male with 6 week history of decrease in appetite, and weight loss of 10 pounds. Over the past couple of weeks has had nausea and abdominal bloating with any significant by mouth intake and has been on liquids at home. He has developed weakness and dark stools. CT on admission showing circumferential thickening of the gastric antrum and at least partial gastric outlet obstruction. Rule out gastric cancer with partial outlet obstruction  #2 anemia-secondary to GI blood loss #3 history of hypertension #4 aortic atherosclerotic disease and by CT suspected significant stenoses of the bilateral external iliacs and right common femoral  PLAN: Keep nothing by mouth He has been started on IV PPI twice a day He is to be transfused 2 units of packed RBCs today Patient be scheduled for upper endoscopy with Dr. Hilarie Fredrickson today. Procedure was discussed in detail with patient including risks and benefits and he is agreeable to proceed. Further plans pending findings at EGD   Lee Pratt  11/10/2017, 9:44 AM

## 2017-11-10 NOTE — Plan of Care (Signed)
  Progressing Education: Knowledge of General Education information will improve 11/10/2017 2234 - Progressing by Talbert Forest, RN Health Behavior/Discharge Planning: Ability to manage health-related needs will improve 11/10/2017 2234 - Progressing by Talbert Forest, RN Nutrition: Adequate nutrition will be maintained 11/10/2017 2234 - Progressing by Talbert Forest, RN Coping: Level of anxiety will decrease 11/10/2017 2234 - Progressing by Talbert Forest, RN Pain Managment: General experience of comfort will improve 11/10/2017 2234 - Progressing by Talbert Forest, RN Safety: Ability to remain free from injury will improve 11/10/2017 2234 - Progressing by Talbert Forest, RN Skin Integrity: Risk for impaired skin integrity will decrease 11/10/2017 2234 - Progressing by Talbert Forest, RN

## 2017-11-10 NOTE — Consult Note (Signed)
Reason for Consult: Gastric mass Referring Physician: [name] is an 83 y.o. male.  HPI:  Pt is an 83 yo F that we are requested to see for gastric outlet obstruction secondary to gastric mass.  The patient was admitted yesterday for anorexia, nausea, and vomiting.  He has also been having dark stools.  He was admitted with anemia.  CT was performed showing antral thickening and gastric distention.  He denies prior cancer history.  He is from [place].  His father had leukemia, but that is the only cancer history he is aware of in his family.  He denies pain, constipation or diarrhea.  He has been able to keep some liquids down, but not consistently.  Biopsies are pending from endoscopy this AM    EGD 11/10/17 Mild esophagitis with no bleeding was found in the lower third of the esophagus. This is likely from reflux in the setting of partial gastric outlet obstruction. Findings: Retained fluid and food debris was found in the gastric body. This was aspirated via the suction channel of the endoscope but was unable to be fully cleared due to semi-solid material. 1800 mL was removed. Diffuse moderate inflammation characterized by erythema and granularity was found in the cardia, in the gastric fundus and in the gastric body. A large, fungating and friable mass with no active bleeding was found at the incisura and in the gastric antrum. The mass was covered with mucus. This was biopsied extensively with a cold forceps for histology. Attempt made to maneuver around this mass and find the pylorus/duodenum, however given retained food/food in the stomach and lack of airway protection, this was not possible.    Past Medical History:  Diagnosis Date  . Hyperglycemia 03/16/2013  . Hypertension     Past Surgical History:  Procedure Laterality Date  . NO PAST SURGERIES  12/21/2011    History reviewed. No pertinent family history.  Social History:  reports that  has never smoked. he has never  used smokeless tobacco. He reports that he does not drink alcohol or use drugs.  Allergies: No Known Allergies  Medications:  Prior to Admission:  Medications Prior to Admission  Medication Sig Dispense Refill Last Dose  . aspirin EC 81 MG tablet Take 81 mg by mouth daily.   Past Week at Unknown time  . CARTIA XT 120 MG 24 hr capsule Take 120 mg daily as needed by mouth (Systolic BP > 970/26).    > 1 month  . diltiazem (CARDIZEM) 120 MG tablet Take 1 tablet (120 mg total) by mouth daily. (Patient not taking: Reported on 11/09/2017) 90 tablet 1 Not Taking at Unknown time  . losartan (COZAAR) 25 MG tablet Take 1 tablet (25 mg total) by mouth daily. (Patient not taking: Reported on 11/09/2017) 30 tablet 6 Not Taking at Unknown time    Results for orders placed or performed during the hospital encounter of 11/09/17 (from the past 48 hour(s))  Lipase, blood     Status: None   Collection Time: 11/09/17  9:31 AM  Result Value Ref Range   Lipase 26 11 - 51 U/L  Comprehensive metabolic panel     Status: Abnormal   Collection Time: 11/09/17  9:31 AM  Result Value Ref Range   Sodium 134 (L) 135 - 145 mmol/L   Potassium 3.4 (L) 3.5 - 5.1 mmol/L   Chloride 102 101 - 111 mmol/L   CO2 23 22 - 32 mmol/L   Glucose, Bld 113 (H)  65 - 99 mg/dL   BUN 20 6 - 20 mg/dL   Creatinine, Ser 1.05 0.61 - 1.24 mg/dL   Calcium 8.6 (L) 8.9 - 10.3 mg/dL   Total Protein 6.6 6.5 - 8.1 g/dL   Albumin 3.2 (L) 3.5 - 5.0 g/dL   AST 28 15 - 41 U/L   ALT 16 (L) 17 - 63 U/L   Alkaline Phosphatase 56 38 - 126 U/L   Total Bilirubin 0.5 0.3 - 1.2 mg/dL   GFR calc non Af Amer >60 >60 mL/min   GFR calc Af Amer >60 >60 mL/min    Comment: (NOTE) The eGFR has been calculated using the CKD EPI equation. This calculation has not been validated in all clinical situations. eGFR's persistently <60 mL/min signify possible Chronic Kidney Disease.    Anion gap 9 5 - 15  CBC     Status: Abnormal   Collection Time: 11/09/17  9:31  AM  Result Value Ref Range   WBC 8.2 4.0 - 10.5 K/uL   RBC 2.70 (L) 4.22 - 5.81 MIL/uL   Hemoglobin 7.8 (L) 13.0 - 17.0 g/dL   HCT 24.0 (L) 39.0 - 52.0 %   MCV 88.9 78.0 - 100.0 fL   MCH 28.9 26.0 - 34.0 pg   MCHC 32.5 30.0 - 36.0 g/dL   RDW 14.1 11.5 - 15.5 %   Platelets 395 150 - 400 K/uL  Type and screen [place]     Status: None (Preliminary result)   Collection Time: 11/09/17  9:31 AM  Result Value Ref Range   ABO/RH(D) O POS    Antibody Screen NEG    Sample Expiration 11/12/2017    Unit Number [id number]    Blood Component Type RED CELLS,LR    Unit division 00    Status of Unit ISSUED    Transfusion Status OK TO TRANSFUSE    Crossmatch Result Compatible    Unit Number [id number]    Blood Component Type RBC LR PHER1    Unit division 00    Status of Unit ALLOCATED    Transfusion Status OK TO TRANSFUSE    Crossmatch Result Compatible   ABO/Rh     Status: None   Collection Time: 11/09/17  9:31 AM  Result Value Ref Range   ABO/RH(D) O POS   Urinalysis, Routine w reflex microscopic     Status: Abnormal   Collection Time: 11/09/17 12:02 PM  Result Value Ref Range   Color, Urine YELLOW YELLOW   APPearance CLEAR CLEAR   Specific Gravity, Urine 1.006 1.005 - 1.030   pH 6.0 5.0 - 8.0   Glucose, UA NEGATIVE NEGATIVE mg/dL   Hgb urine dipstick NEGATIVE NEGATIVE   Bilirubin Urine NEGATIVE NEGATIVE   Ketones, ur 5 (A) NEGATIVE mg/dL   Protein, ur NEGATIVE NEGATIVE mg/dL   Nitrite NEGATIVE NEGATIVE   Leukocytes, UA NEGATIVE NEGATIVE  POC occult blood, ED     Status: None   Collection Time: 11/09/17 12:08 PM  Result Value Ref Range   Fecal Occult Bld NEGATIVE NEGATIVE  Iron and TIBC     Status: Abnormal   Collection Time: 11/09/17  4:33 PM  Result Value Ref Range   Iron 11 (L) 45 - 182 ug/dL   TIBC 377 250 - 450 ug/dL   Saturation Ratios 3 (L) 17.9 - 39.5 %   UIBC 366 ug/dL    Comment: Performed at [facility], [address]  [place], [place] [address]  Comprehensive metabolic panel     Status: Abnormal   Collection Time: 11/10/17  5:04 AM  Result Value Ref Range   Sodium 137 135 - 145 mmol/L   Potassium 3.8 3.5 - 5.1 mmol/L   Chloride 106 101 - 111 mmol/L   CO2 21 (L) 22 - 32 mmol/L   Glucose, Bld 86 65 - 99 mg/dL   BUN 18 6 - 20 mg/dL   Creatinine, Ser 0.92 0.61 - 1.24 mg/dL   Calcium 8.1 (L) 8.9 - 10.3 mg/dL   Total Protein 5.5 (L) 6.5 - 8.1 g/dL   Albumin 2.7 (L) 3.5 - 5.0 g/dL   AST 20 15 - 41 U/L   ALT 14 (L) 17 - 63 U/L   Alkaline Phosphatase 49 38 - 126 U/L   Total Bilirubin 0.8 0.3 - 1.2 mg/dL   GFR calc non Af Amer >60 >60 mL/min   GFR calc Af Amer >60 >60 mL/min    Comment: (NOTE) The eGFR has been calculated using the CKD EPI equation. This calculation has not been validated in all clinical situations. eGFR's persistently <60 mL/min signify possible Chronic Kidney Disease.    Anion gap 10 5 - 15  CBC     Status: Abnormal   Collection Time: 11/10/17  5:04 AM  Result Value Ref Range   WBC 5.4 4.0 - 10.5 K/uL   RBC 2.39 (L) 4.22 - 5.81 MIL/uL   Hemoglobin 7.0 (L) 13.0 - 17.0 g/dL   HCT 21.1 (L) 39.0 - 52.0 %   MCV 88.3 78.0 - 100.0 fL   MCH 29.3 26.0 - 34.0 pg   MCHC 33.2 30.0 - 36.0 g/dL   RDW 14.1 11.5 - 15.5 %   Platelets 292 150 - 400 K/uL  Magnesium     Status: None   Collection Time: 11/10/17  5:04 AM  Result Value Ref Range   Magnesium 2.1 1.7 - 2.4 mg/dL  Prepare RBC     Status: None   Collection Time: 11/10/17 10:00 AM  Result Value Ref Range   Order Confirmation ORDER PROCESSED BY BLOOD BANK     Ct Abdomen Pelvis W Contrast  Result Date: 11/09/2017 CLINICAL DATA:  Hypotension and anemia. EXAM: CT ABDOMEN AND PELVIS WITH CONTRAST TECHNIQUE: Multidetector CT imaging of the abdomen and pelvis was performed using the standard protocol following bolus administration of intravenous contrast. CONTRAST:  100 cc Isovue 300 COMPARISON:  None. FINDINGS: Lower chest: Limited  visualization of the lower thorax demonstrates short-segment occlusion of left lower lobe lateral basal bronchi (image 19, series 7, coronal image 64, series 4). Minimal dependent atelectasis/scar. Punctate granuloma within the right lower lobe. No pleural effusion. Normal heart size. Coronary artery calcifications. Calcifications within the mitral valve leaflets. No pericardial effusion. Hepatobiliary: Normal hepatic contour. No discrete hepatic lesions. No intra extrahepatic biliary duct dilatation. No ascites. Pancreas: Normal appearance of the pancreas Spleen: Normal appearance of the spleen Adrenals/Urinary Tract: There is symmetric enhancement and excretion of the bilateral kidneys. Note is made of a small right-sided extrarenal pelvis. No definite renal stones on this postcontrast examination. No urinary obstruction or perinephric stranding. Normal appearance the urinary bladder given underdistention. Normal appearance of the bilateral adrenal glands. Stomach/Bowel: There is circumferential wall thickening involving the gastric antrum (axial image 34, series 2, coronal image 43, series 4) with marked fluid distention of the stomach. The sigmoid colon is noted to be redundant. The hepatic flexure of the colon is noted to be interposed adjacent to the  anterolateral aspect of the liver. The bowel is otherwise normal in course and caliber without wall thickening or evidence of enteric obstruction. Normal appearance of the terminal ileum and appendix. No pneumoperitoneum, pneumatosis or portal venous gas. Vascular/Lymphatic: There is a large amount of mixed calcified and noncalcified irregular atherosclerotic plaque throughout the abdominal aorta. The abdominal aorta is noted to be mildly ectatic measuring approximately 2.7 cm in greatest diameter. The IMA is not definitely identified. Irregular calcified plaque involves the origin of all the major branch vessels of the abdominal aorta however the major branch  vessels appear patent. Suspected hemodynamically significant narrowings involving the bilateral external iliac arteries as well as the right common femoral artery (image 81, series 2). Reproductive: Normal sized prostate gland. Small amount of free fluid in the pelvic cul-de-sac. Other: Regional soft tissues appear normal. Musculoskeletal: No acute or aggressive osseous abnormalities. Stigmata of DISH within the lower thoracic spine. IMPRESSION: 1. Circumferential wall thickening involving the gastric antrum with marked distension of the stomach suggestive of an element of gastric outlet obstruction. Further evaluation with endoscopy is recommended. 2. Short-segment occlusion involving the left lower lobe lateral basal subsegmental bronchus, nonspecific though could be indicative of aspiration in the setting of suspected gastric outlet obstruction. Clinical correlation is advised. 3. Small amount of fluid seen with the pelvic cul-de-sac, presumably reactive. 4. Mild ectasia of the abdominal aorta measuring 2.7 cm in diameter. Recommend follow-up aortic ultrasound in 5 years. This recommendation follows ACR consensus guidelines: White Paper of the ACR Incidental Findings Committee II on Vascular Findings. J Am Coll Radiol 2013; 43:329-518. 5. Aortic Atherosclerosis (ICD10-I70.0). Suspected hemodynamically significant stenoses involving the bilateral external iliac arteries and the right common femoral artery. Correlation for lower extremity PAD symptoms is recommended. Further evaluation with the acquisition of ABIs could be performed as indicated. Electronically Signed   By: Sandi Mariscal M.D.   On: 11/09/2017 12:22   Dg Abd Acute W/chest  Result Date: 11/08/2017 CLINICAL DATA:  Epigastric abdominal pain, vomiting and weight loss. EXAM: DG ABDOMEN ACUTE W/ 1V CHEST COMPARISON:  08/23/2015 chest radiograph. FINDINGS: Stable cardiomediastinal silhouette with normal heart size and aortic atherosclerosis. No  pneumothorax. No pleural effusion. Stable right apical pleural-parenchymal scarring and calcification. Stable scattered granulomas at the periphery of the right lung. No pulmonary edema. No acute consolidative airspace disease. No dilated small bowel loops or significant air-fluid levels. Minimal stool and gas in the large bowel. No evidence of pneumatosis or pneumoperitoneum. No radiopaque nephrolithiasis. Extensive iliofemoral atherosclerosis. Moderate lumbar spondylosis. IMPRESSION: 1. No active cardiopulmonary disease. 2. Nonobstructive bowel gas pattern. Electronically Signed   By: Ilona Sorrel M.D.   On: 11/08/2017 15:54    Review of Systems  Constitutional: Positive for weight loss.  HENT: Positive for hearing loss (chronic).   Eyes: Negative.   Respiratory: Negative.   Cardiovascular: Negative.   Gastrointestinal: Positive for nausea and vomiting.  Genitourinary: Negative.   Musculoskeletal: Negative.   Skin: Negative.   Neurological: Negative.   Endo/Heme/Allergies: Negative.   Psychiatric/Behavioral: Negative.   All other systems reviewed and are negative.     Blood pressure 136/66, pulse 86, temperature 98.2 F (36.8 C), temperature source Oral, resp. rate 16, height '5\' 9"'$  (1.753 m), weight 59.2 kg (130 lb 8.2 oz), SpO2 98 %.   Physical Exam  Constitutional: He is oriented to person, place, and time. He appears well-developed and well-nourished. No distress.  HENT:  Head: Normocephalic and atraumatic.  Right Ear: External ear normal.  Left  Ear: External ear normal.  Eyes: Conjunctivae are normal. Pupils are equal, round, and reactive to light. Right eye exhibits no discharge. Left eye exhibits no discharge. No scleral icterus.  Neck: Normal range of motion. Neck supple. No JVD present. No tracheal deviation present. No thyromegaly present.  Cardiovascular: Normal rate, regular rhythm and intact distal pulses.  Respiratory: Effort normal. No respiratory distress.  GI: Soft.  He exhibits no distension and no mass. There is no tenderness. There is no rebound and no guarding.  Musculoskeletal: Normal range of motion. He exhibits no edema, tenderness or deformity.  Lymphadenopathy:    He has no cervical adenopathy.  Neurological: He is alert and oriented to person, place, and time. Coordination normal.  Hard of hearing.  Skin: Skin is warm and dry. No rash noted. He is not diaphoretic. No erythema. There is pallor.  Psychiatric: He has a normal mood and affect. His behavior is normal. Judgment and thought content normal.     Assessment/Plan: Gastric outlet obstruction Gastric mass Chronic blood loss anemia Severe protein calorie malnutrition HTN   Await biopsies from endoscopy. Clears are OK if he is tolerating. Check prealbumin.  If low, would start TNA, as he would not be expected to be on full enteric diet for a while. Will communicate to Dr. Marcello Moores.  Pt will need surgical plan.  Assuming malignant pathology, he would not be able to get neoadjuvant treatment with the gastric outlet obstruction.    I discussed that this is likely cancer and that removal is not always possible even if CT does not appear to show invasion.  I also discussed that he will need a feeding tube.    He is getting transfused for symptomatic anemia.  Will follow.   Alroy Portela 11/10/2017, 1:19 PM

## 2017-11-11 ENCOUNTER — Encounter (HOSPITAL_COMMUNITY): Payer: Self-pay | Admitting: Internal Medicine

## 2017-11-11 DIAGNOSIS — K311 Adult hypertrophic pyloric stenosis: Secondary | ICD-10-CM

## 2017-11-11 DIAGNOSIS — R935 Abnormal findings on diagnostic imaging of other abdominal regions, including retroperitoneum: Secondary | ICD-10-CM

## 2017-11-11 DIAGNOSIS — D490 Neoplasm of unspecified behavior of digestive system: Secondary | ICD-10-CM

## 2017-11-11 LAB — BPAM RBC
BLOOD PRODUCT EXPIRATION DATE: 201812042359
BLOOD PRODUCT EXPIRATION DATE: 201812062359
ISSUE DATE / TIME: 201811111057
ISSUE DATE / TIME: 201811111418
UNIT TYPE AND RH: 5100
Unit Type and Rh: 5100

## 2017-11-11 LAB — BASIC METABOLIC PANEL
ANION GAP: 10 (ref 5–15)
BUN: 12 mg/dL (ref 6–20)
CHLORIDE: 108 mmol/L (ref 101–111)
CO2: 18 mmol/L — ABNORMAL LOW (ref 22–32)
Calcium: 8.2 mg/dL — ABNORMAL LOW (ref 8.9–10.3)
Creatinine, Ser: 0.93 mg/dL (ref 0.61–1.24)
GFR calc Af Amer: >60 mL/min (ref >60)
GFR calc non Af Amer: >60 mL/min (ref >60)
Glucose, Bld: 80 mg/dL (ref 65–99)
POTASSIUM: 3.9 mmol/L (ref 3.5–5.1)
SODIUM: 136 mmol/L (ref 135–145)

## 2017-11-11 LAB — TYPE AND SCREEN
ABO/RH(D): O POS
ANTIBODY SCREEN: NEGATIVE
UNIT DIVISION: 0
Unit division: 0

## 2017-11-11 LAB — SURGICAL PCR SCREEN
MRSA, PCR: NEGATIVE
STAPHYLOCOCCUS AUREUS: NEGATIVE

## 2017-11-11 LAB — PREALBUMIN: PREALBUMIN: 10 mg/dL — AB (ref 18–38)

## 2017-11-11 MED ORDER — CEFAZOLIN SODIUM-DEXTROSE 2-4 GM/100ML-% IV SOLN
2.0000 g | INTRAVENOUS | Status: DC
  Filled 2017-11-11: qty 100

## 2017-11-11 MED ORDER — CEFAZOLIN SODIUM-DEXTROSE 2-4 GM/100ML-% IV SOLN
2.0000 g | INTRAVENOUS | Status: AC
  Administered 2017-11-12: 2 g via INTRAVENOUS

## 2017-11-11 MED ORDER — BOOST / RESOURCE BREEZE PO LIQD
1.0000 | Freq: Three times a day (TID) | ORAL | Status: DC
  Administered 2017-11-11 (×2): 1 via ORAL

## 2017-11-11 NOTE — Progress Notes (Signed)
Gastric outlet obstruction  Subjective: Pt tolerating clears  Objective: Vital signs in last 24 hours: Temp:  [97.8 F (36.6 C)-98.2 F (36.8 C)] 98 F (36.7 C) (11/12 0448) Pulse Rate:  [75-94] 87 (11/12 0448) Resp:  [13-26] 18 (11/12 0448) BP: (111-162)/(51-84) 127/76 (11/12 0448) SpO2:  [97 %-100 %] 100 % (11/12 0448) Last BM Date: 11/10/17  Intake/Output from previous day: 11/11 0701 - 11/12 0700 In: 3196 [P.O.:720; I.V.:1800; Blood:676] Out: -  Intake/Output this shift: No intake/output data recorded.  General appearance: alert and cooperative GI: soft  Lab Results:  Results for orders placed or performed during the hospital encounter of 11/09/17 (from the past 24 hour(s))  Prepare RBC     Status: None   Collection Time: 11/10/17 10:00 AM  Result Value Ref Range   Order Confirmation ORDER PROCESSED BY BLOOD BANK   Prealbumin     Status: Abnormal   Collection Time: 11/10/17  8:02 PM  Result Value Ref Range   Prealbumin 10.0 (L) 18 - 38 mg/dL  CBC     Status: Abnormal   Collection Time: 11/11/17  4:49 AM  Result Value Ref Range   WBC 8.8 4.0 - 10.5 K/uL   RBC 3.39 (L) 4.22 - 5.81 MIL/uL   Hemoglobin 9.7 (L) 13.0 - 17.0 g/dL   HCT 29.2 (L) 39.0 - 52.0 %   MCV 86.1 78.0 - 100.0 fL   MCH 28.6 26.0 - 34.0 pg   MCHC 33.2 30.0 - 36.0 g/dL   RDW 14.7 11.5 - 15.5 %   Platelets 342 150 - 400 K/uL  Basic metabolic panel     Status: Abnormal   Collection Time: 11/11/17  4:49 AM  Result Value Ref Range   Sodium 136 135 - 145 mmol/L   Potassium 3.9 3.5 - 5.1 mmol/L   Chloride 108 101 - 111 mmol/L   CO2 18 (L) 22 - 32 mmol/L   Glucose, Bld 80 65 - 99 mg/dL   BUN 12 6 - 20 mg/dL   Creatinine, Ser 0.93 0.61 - 1.24 mg/dL   Calcium 8.2 (L) 8.9 - 10.3 mg/dL   GFR calc non Af Amer >60 >60 mL/min   GFR calc Af Amer >60 >60 mL/min   Anion gap 10 5 - 15     Studies/Results Radiology     MEDS, Scheduled . pantoprazole (PROTONIX) IV  40 mg Intravenous Q12H      Assessment: Gastric outlet obstruction Presumed malignancy  Plan: OR tom for diagnostic laparoscopy and J tube placement Cont workup for GI malignancy   LOS: 1 day    Rosario Adie, MD [facility], [place] [phone]   11/11/2017 9:21 AM

## 2017-11-11 NOTE — Progress Notes (Signed)
[facility] Progress Note  Chief Complaint:  Gastric tumor, likely cancer  Subjective: No abdominal pain or nausea. Wishes he could go home. Clear liquid breakfast at bedside, he hasn't touched them    Objective:  Vital signs in last 24 hours: Temp:  [97.8 F (36.6 C)-98.2 F (36.8 C)] 98 F (36.7 C) (11/12 0448) Pulse Rate:  [75-94] 87 (11/12 0448) Resp:  [13-26] 18 (11/12 0448) BP: (111-162)/(51-84) 127/76 (11/12 0448) SpO2:  [97 %-100 %] 100 % (11/12 0448) Last BM Date: 11/10/17 General:   Alert, thin Asian male in NAD EENT:  Normal hearing, non icteric sclera, conjunctive pink.  Heart:  Regular rate and rhythm, no  lower extremity edema Pulm: Normal respiratory effort, lungs CTA bilaterally without wheezes or crackles. Abdomen:  Soft, nondistended, nontender.  Normal bowel sounds.    Neurologic:  Alert and  oriented x4;  grossly normal neurologically. Psych:  Pleasant, cooperative.  Normal mood and affect.   Intake/Output from previous day: 11/11 0701 - 11/12 0700 In: 3196 [P.O.:720; I.V.:1800; Blood:676] Out: -  Intake/Output this shift: No intake/output data recorded.  Lab Results: Recent Labs    11/09/17 0931 11/10/17 0504 11/11/17 0449  WBC 8.2 5.4 8.8  HGB 7.8* 7.0* 9.7*  HCT 24.0* 21.1* 29.2*  PLT 395 292 342   BMET Recent Labs    11/09/17 0931 11/10/17 0504 11/11/17 0449  NA 134* 137 136  K 3.4* 3.8 3.9  CL 102 106 108  CO2 23 21* 18*  GLUCOSE 113* 86 80  BUN 20 18 12   CREATININE 1.05 0.92 0.93  CALCIUM 8.6* 8.1* 8.2*   LFT Recent Labs    11/10/17 0504  PROT 5.5*  ALBUMIN 2.7*  AST 20  ALT 14*  ALKPHOS 49  BILITOT 0.8   Ct Abdomen Pelvis W Contrast  Result Date: 11/09/2017 CLINICAL DATA:  Hypotension and anemia. EXAM: CT ABDOMEN AND PELVIS WITH CONTRAST TECHNIQUE: Multidetector CT imaging of the abdomen and pelvis was performed using the standard protocol following bolus administration of intravenous contrast.  CONTRAST:  100 cc Isovue 300 COMPARISON:  None. FINDINGS: Lower chest: Limited visualization of the lower thorax demonstrates short-segment occlusion of left lower lobe lateral basal bronchi (image 19, series 7, coronal image 64, series 4). Minimal dependent atelectasis/scar. Punctate granuloma within the right lower lobe. No pleural effusion. Normal heart size. Coronary artery calcifications. Calcifications within the mitral valve leaflets. No pericardial effusion. Hepatobiliary: Normal hepatic contour. No discrete hepatic lesions. No intra extrahepatic biliary duct dilatation. No ascites. Pancreas: Normal appearance of the pancreas Spleen: Normal appearance of the spleen Adrenals/Urinary Tract: There is symmetric enhancement and excretion of the bilateral kidneys. Note is made of a small right-sided extrarenal pelvis. No definite renal stones on this postcontrast examination. No urinary obstruction or perinephric stranding. Normal appearance the urinary bladder given underdistention. Normal appearance of the bilateral adrenal glands. Stomach/Bowel: There is circumferential wall thickening involving the gastric antrum (axial image 34, series 2, coronal image 43, series 4) with marked fluid distention of the stomach. The sigmoid colon is noted to be redundant. The hepatic flexure of the colon is noted to be interposed adjacent to the anterolateral aspect of the liver. The bowel is otherwise normal in course and caliber without wall thickening or evidence of enteric obstruction. Normal appearance of the terminal ileum and appendix. No pneumoperitoneum, pneumatosis or portal venous gas. Vascular/Lymphatic: There is a large amount of mixed calcified and noncalcified irregular atherosclerotic plaque throughout the abdominal aorta.  The abdominal aorta is noted to be mildly ectatic measuring approximately 2.7 cm in greatest diameter. The IMA is not definitely identified. Irregular calcified plaque involves the origin of  all the major branch vessels of the abdominal aorta however the major branch vessels appear patent. Suspected hemodynamically significant narrowings involving the bilateral external iliac arteries as well as the right common femoral artery (image 81, series 2). Reproductive: Normal sized prostate gland. Small amount of free fluid in the pelvic cul-de-sac. Other: Regional soft tissues appear normal. Musculoskeletal: No acute or aggressive osseous abnormalities. Stigmata of DISH within the lower thoracic spine. IMPRESSION: 1. Circumferential wall thickening involving the gastric antrum with marked distension of the stomach suggestive of an element of gastric outlet obstruction. Further evaluation with endoscopy is recommended. 2. Short-segment occlusion involving the left lower lobe lateral basal subsegmental bronchus, nonspecific though could be indicative of aspiration in the setting of suspected gastric outlet obstruction. Clinical correlation is advised. 3. Small amount of fluid seen with the pelvic cul-de-sac, presumably reactive. 4. Mild ectasia of the abdominal aorta measuring 2.7 cm in diameter. Recommend follow-up aortic ultrasound in 5 years. This recommendation follows ACR consensus guidelines: White Paper of the ACR Incidental Findings Committee II on Vascular Findings. J Am Coll Radiol 2013; 02:585-277. 5. Aortic Atherosclerosis (ICD10-I70.0). Suspected hemodynamically significant stenoses involving the bilateral external iliac arteries and the right common femoral artery. Correlation for lower extremity PAD symptoms is recommended. Further evaluation with the acquisition of ABIs could be performed as indicated. Electronically Signed   By: Sandi Mariscal M.D.   On: 11/09/2017 12:22    ASSESSMENT / PLAN:   44. 83 yo male with gastric mass / partial GOO. No obvious invasion on CT scan. Appetite is poor, he has lost weight but currently not having any nausea / vomiting.  -Surgery evaluated yesterday.  Awaiting path. Patient understands that he is going to OR tomorrow for diagnostic lap with J tube placement. He needs to improve nutritional status. .    2. ABL. Improved -hgb up from 7.0 to 9.7 post 2 units of blood yesterday    Principal Problem:   Gastric outlet obstruction from gastric tumor Active Problems:   HTN (hypertension)   Hyperlipidemia   Arrhythmia   Acute blood loss anemia   Abnormal CT scan, stomach   Nausea without vomiting   Abnormal loss of weight   Gastric tumor     LOS: 1 day   Tye Savoy ,NP 11/11/2017, 9:15 AM  Pager number [phone]  GI ATTENDING  Case discussed with Dr. Hilarie Fredrickson. Interval history data reviewed. Agree with interval progress note. Case discussed with general surgery. The patient stable. Biopsies pending. Plans for operative intervention as outlined noted. We're available as needed. Will sign off.  Docia Chuck. Geri Seminole., M.D. [facility] Division of Gastroenterology

## 2017-11-11 NOTE — Progress Notes (Signed)
Patient ID: Lee Pratt, male   DOB: 09/15/1934, 83 y.o.   MRN: [id number]    PROGRESS NOTE  Lee Pratt  QZE:[id number] DOB: Sep 11, 1934 DOA: 11/09/2017  PCP: [name], MD   Brief Narrative:  Patient is 83 year old male who presented from primary care physician office for further evaluation of significant anemia, hemoglobin 8.5. Patient reports approximately 10 pound weight loss in the past 6 weeks associated with poor oral intake, malaise and fatigue. Patient reports several days duration of progressively worsening nausea and abdominal bloating but denies any vomiting.  In emergency department, hemoglobin 7.8, FOBT negative, patient was orthostatic. CT scan notable for circumferential wall thickening involving the gastric antrum and marked distention of the stomach suggestive of an element of gastric outlet obstruction, recommendation for endoscopy.  Assessment & Plan:   Principal Problem:   Acute blood loss anemia, GI bleed, question gastric outlet obstruction - EGD done, large antrum mass noted, suspect malignant, biopsied - Awaiting for biopsies - allow clear liquid diet for now - Checking pre-albumin, if low plan on starting TNA - transfused two U PRBC 11/11 and Hg up appropriately  - plan to take to OR for diagnostic laparoscopy and J tube placement   Active Problems:   HTN (hypertension) - Reasonable inpatient control  DVT prophylaxis:  SCDs  Code Status:  full code  Family Communication: Patient at bedside  Disposition Plan: To be determined   Consultants:   GI   Procedures:   EGD 11/10/2017  Antimicrobials:   None  Subjective: Pt denies concerns this AM.   Objective: Vitals:   11/10/17 1445 11/10/17 1708 11/10/17 2140 11/11/17 0448  BP: 115/77 (!) 147/84 (!) 114/51 127/76  Pulse: 80 91 89 87  Resp:  20 18 18   Temp: 98 F (36.7 C) 98 F (36.7 C) 98 F (36.7 C) 98 F (36.7 C)  TempSrc: Oral Oral Oral Oral  SpO2:  100% 100% 100%  Weight:      Height:         Intake/Output Summary (Last 24 hours) at 11/11/2017 1151 Last data filed at 11/11/2017 1113 Gross per 24 hour  Intake 3556 ml  Output -  Net 3556 ml   Filed Weights   11/09/17 1612  Weight: 59.2 kg (130 lb 8.2 oz)    Physical Exam  Constitutional: Appears calm ,NAD  CVS: RRR, S1/S2 +, no murmurs, no gallops, no carotid bruit.  Pulmonary: Effort and breath sounds normal, no stridor, rhonchi, wheezes, rales.  Abdominal: Soft. BS +,  no distension, tenderness, rebound or guarding.  Neuro: Alert. Normal reflexes, muscle tone coordination. No cranial nerve deficit.  Data Reviewed: I have personally reviewed following labs and imaging studies  CBC: Recent Labs  Lab 11/08/17 1603 11/09/17 0931 11/10/17 0504 11/11/17 0449  WBC 10.5* 8.2 5.4 8.8  HGB 8.5* 7.8* 7.0* 9.7*  HCT 26.1* 24.0* 21.1* 29.2*  MCV 89.3 88.9 88.3 86.1  PLT  --  395 292 226   Basic Metabolic Panel: Recent Labs  Lab 11/08/17 1656 11/09/17 0931 11/10/17 0504 11/11/17 0449  NA 134 134* 137 136  K 4.0 3.4* 3.8 3.9  CL 99 102 106 108  CO2 18* 23 21* 18*  GLUCOSE 125* 113* 86 80  BUN 22 20 18 12   CREATININE 1.04 1.05 0.92 0.93  CALCIUM 9.0 8.6* 8.1* 8.2*  MG  --   --  2.1  --    Liver Function Tests: Recent Labs  Lab 11/08/17 1656 11/09/17 0931 11/10/17 0504  AST 23 28 20   ALT 13 16* 14*  ALKPHOS 70 56 49  BILITOT 0.3 0.5 0.8  PROT 6.6 6.6 5.5*  ALBUMIN 4.0 3.2* 2.7*   Recent Labs  Lab 11/08/17 1656 11/09/17 0931  LIPASE 27 26   Anemia Panel: Recent Labs    11/09/17 1633  TIBC 377  IRON 11*   Urine analysis:    Component Value Date/Time   COLORURINE YELLOW 11/09/2017 [place] 11/09/2017 1202   LABSPEC 1.006 11/09/2017 1202   PHURINE 6.0 11/09/2017 1202   GLUCOSEU NEGATIVE 11/09/2017 1202   HGBUR NEGATIVE 11/09/2017 1202   BILIRUBINUR NEGATIVE 11/09/2017 1202   KETONESUR 5 (A) 11/09/2017 1202   PROTEINUR NEGATIVE 11/09/2017 1202   NITRITE NEGATIVE  11/09/2017 1202   LEUKOCYTESUR NEGATIVE 11/09/2017 1202   Radiology Studies: Ct Abdomen Pelvis W Contrast  Result Date: 11/09/2017 CLINICAL DATA:  Hypotension and anemia. EXAM: CT ABDOMEN AND PELVIS WITH CONTRAST TECHNIQUE: Multidetector CT imaging of the abdomen and pelvis was performed using the standard protocol following bolus administration of intravenous contrast. CONTRAST:  100 cc Isovue 300 COMPARISON:  None. FINDINGS: Lower chest: Limited visualization of the lower thorax demonstrates short-segment occlusion of left lower lobe lateral basal bronchi (image 19, series 7, coronal image 64, series 4). Minimal dependent atelectasis/scar. Punctate granuloma within the right lower lobe. No pleural effusion. Normal heart size. Coronary artery calcifications. Calcifications within the mitral valve leaflets. No pericardial effusion. Hepatobiliary: Normal hepatic contour. No discrete hepatic lesions. No intra extrahepatic biliary duct dilatation. No ascites. Pancreas: Normal appearance of the pancreas Spleen: Normal appearance of the spleen Adrenals/Urinary Tract: There is symmetric enhancement and excretion of the bilateral kidneys. Note is made of a small right-sided extrarenal pelvis. No definite renal stones on this postcontrast examination. No urinary obstruction or perinephric stranding. Normal appearance the urinary bladder given underdistention. Normal appearance of the bilateral adrenal glands. Stomach/Bowel: There is circumferential wall thickening involving the gastric antrum (axial image 34, series 2, coronal image 43, series 4) with marked fluid distention of the stomach. The sigmoid colon is noted to be redundant. The hepatic flexure of the colon is noted to be interposed adjacent to the anterolateral aspect of the liver. The bowel is otherwise normal in course and caliber without wall thickening or evidence of enteric obstruction. Normal appearance of the terminal ileum and appendix. No  pneumoperitoneum, pneumatosis or portal venous gas. Vascular/Lymphatic: There is a large amount of mixed calcified and noncalcified irregular atherosclerotic plaque throughout the abdominal aorta. The abdominal aorta is noted to be mildly ectatic measuring approximately 2.7 cm in greatest diameter. The IMA is not definitely identified. Irregular calcified plaque involves the origin of all the major branch vessels of the abdominal aorta however the major branch vessels appear patent. Suspected hemodynamically significant narrowings involving the bilateral external iliac arteries as well as the right common femoral artery (image 81, series 2). Reproductive: Normal sized prostate gland. Small amount of free fluid in the pelvic cul-de-sac. Other: Regional soft tissues appear normal. Musculoskeletal: No acute or aggressive osseous abnormalities. Stigmata of DISH within the lower thoracic spine. IMPRESSION: 1. Circumferential wall thickening involving the gastric antrum with marked distension of the stomach suggestive of an element of gastric outlet obstruction. Further evaluation with endoscopy is recommended. 2. Short-segment occlusion involving the left lower lobe lateral basal subsegmental bronchus, nonspecific though could be indicative of aspiration in the setting of suspected gastric outlet obstruction. Clinical correlation is advised. 3. Small amount of fluid seen  with the pelvic cul-de-sac, presumably reactive. 4. Mild ectasia of the abdominal aorta measuring 2.7 cm in diameter. Recommend follow-up aortic ultrasound in 5 years. This recommendation follows ACR consensus guidelines: White Paper of the ACR Incidental Findings Committee II on Vascular Findings. J Am Coll Radiol 2013; 65:681-275. 5. Aortic Atherosclerosis (ICD10-I70.0). Suspected hemodynamically significant stenoses involving the bilateral external iliac arteries and the right common femoral artery. Correlation for lower extremity PAD symptoms is  recommended. Further evaluation with the acquisition of ABIs could be performed as indicated. Electronically Signed   By: Sandi Mariscal M.D.   On: 11/09/2017 12:22   Scheduled Meds: . feeding supplement  1 Container Oral TID BM  . pantoprazole (PROTONIX) IV  40 mg Intravenous Q12H   Continuous Infusions: . sodium chloride 75 mL/hr at 11/11/17 1113  . [START ON 11/12/2017]  ceFAZolin (ANCEF) IV       LOS: 1 day    Time spent: 25 minutes    Faye Ramsay, MD [facility] Pager [phone]  If 7PM-7AM, please contact night-coverage [email or web address] Password [email or web address] 11/11/2017, 11:51 AM

## 2017-11-11 NOTE — Progress Notes (Signed)
Initial Nutrition Assessment  DOCUMENTATION CODES:   Non-severe (moderate) malnutrition in context of acute illness/injury  INTERVENTION:    Monitor for diet advancement/toleration  Boost Breeze po TID, each supplement provides 250 kcal and 9 grams of protein  NUTRITION DIAGNOSIS:   Moderate Malnutrition related to acute illness(gastric outlet obstruction) as evidenced by energy intake < or equal to 50% for > or equal to 1 month, moderate fat depletion, severe fat depletion, severe muscle depletion.  GOAL:   Patient will meet greater than or equal to 90% of their needs  MONITOR:   PO intake, Supplement acceptance, Diet advancement, Weight trends, Labs  REASON FOR ASSESSMENT:   Consult Assessment of nutrition requirement/status  ASSESSMENT:   Pt with PMH significant for HTN. Presents this admission with complaints of decreased appetite and abdominal distention. CT scan showed at partial gastric outlet obstruction. Admitted for anemia secondary to GI blood loss.   11/11- Pt had EGD showing large antrum mass, apparently malignant. Biopsied.   Spoke with pt at bedside.  Reports having poor appetite for 6 weeks related to abdominal pain and gas.  Within the last three weeks, pt states abdominal distention became increasingly worse resulting in pt consuming a liquid diet (mostly soup).  Tolerating clears at this time. Pt amendable to supplements this hospital stay.  Pt reports losing 10 lb within a 6 week time frame showing a 7% weight loss in 1.5 months.   Records limited in weight history for 2018.  Nutrition-Focused physical exam completed. See findings below. Suspect some depletions are from advanced age along with poor intake.   Medications reviewed and include: NS @ 75 ml/hr Labs reviewed.   NUTRITION - FOCUSED PHYSICAL EXAM:    Most Recent Value  Orbital Region  Moderate depletion  Upper Arm Region  Moderate depletion  Thoracic and Lumbar Region  Severe depletion   Buccal Region  Severe depletion  Temple Region  Severe depletion  Clavicle Bone Region  Severe depletion  Clavicle and Acromion Bone Region  Severe depletion  Scapular Bone Region  Severe depletion  Dorsal Hand  Moderate depletion  Patellar Region  Severe depletion  Anterior Thigh Region  Severe depletion  Posterior Calf Region  Severe depletion  Edema (RD Assessment)  None  Hair  Reviewed  Eyes  Reviewed  Mouth  Reviewed  Skin  Reviewed  Nails  Reviewed       Diet Order:  Diet clear liquid Room service appropriate? Yes; Fluid consistency: Thin  EDUCATION NEEDS:   Education needs have been addressed  Skin:  Skin Assessment: Reviewed RN Assessment  Last BM:  11/10/17  Height:   Ht Readings from Last 1 Encounters:  11/09/17 5\' 9"  (1.753 m)    Weight:   Wt Readings from Last 1 Encounters:  11/09/17 130 lb 8.2 oz (59.2 kg)    Ideal Body Weight:  72.7 kg  BMI:  Body mass index is 19.27 kg/m.  Estimated Nutritional Needs:   Kcal:  1800-2000 kcal/day  Protein:  90-100 g/day  Fluid:  >1.8 L/day    Mariana Single RD, LDN Clinical Nutrition Pager # - [phone]

## 2017-11-11 NOTE — Plan of Care (Signed)
  Progressing Health Behavior/Discharge Planning: Ability to manage health-related needs will improve 11/11/2017 2211 - Progressing by Talbert Forest, RN Clinical Measurements: Ability to maintain clinical measurements within normal limits will improve 11/11/2017 2211 - Progressing by Talbert Forest, RN Will remain free from infection 11/11/2017 2211 - Progressing by Talbert Forest, RN Activity: Risk for activity intolerance will decrease 11/11/2017 2211 - Progressing by Talbert Forest, RN Coping: Level of anxiety will decrease 11/11/2017 2211 - Progressing by Talbert Forest, RN Elimination: Will not experience complications related to urinary retention 11/11/2017 2211 - Progressing by Talbert Forest, RN Skin Integrity: Risk for impaired skin integrity will decrease 11/11/2017 2211 - Progressing by Talbert Forest, RN

## 2017-11-12 ENCOUNTER — Encounter (HOSPITAL_COMMUNITY): Payer: Self-pay | Admitting: Certified Registered Nurse Anesthetist

## 2017-11-12 ENCOUNTER — Encounter (HOSPITAL_COMMUNITY)
Admission: EM | Disposition: A | Discharge status: Home Health | Payer: Self-pay | Source: Home / Self Care | Attending: Internal Medicine

## 2017-11-12 ENCOUNTER — Inpatient Hospital Stay (HOSPITAL_COMMUNITY): Payer: Medicare Other | Admitting: Certified Registered Nurse Anesthetist

## 2017-11-12 HISTORY — PX: GASTROJEJUNOSTOMY: SHX1697

## 2017-11-12 LAB — CBC
HCT: 32.2 % — ABNORMAL LOW (ref 39.0–52.0)
HEMATOCRIT: 29.2 % — AB (ref 39.0–52.0)
Hemoglobin: 9.7 g/dL — ABNORMAL LOW (ref 13.0–17.0)
Hemoglobin: 10.3 g/dL — ABNORMAL LOW (ref 13.0–17.0)
MCH: 28.6 pg (ref 26.0–34.0)
MCH: 27.8 pg (ref 26.0–34.0)
MCHC: 33.2 g/dL (ref 30.0–36.0)
MCHC: 32 g/dL (ref 30.0–36.0)
MCV: 86.1 fL (ref 78.0–100.0)
MCV: 87 fL (ref 78.0–100.0)
PLATELETS: 368 10*3/uL (ref 150–400)
Platelets: 342 10*3/uL (ref 150–400)
RBC: 3.39 MIL/uL — ABNORMAL LOW (ref 4.22–5.81)
RBC: 3.7 MIL/uL — ABNORMAL LOW (ref 4.22–5.81)
RDW: 14.7 % (ref 11.5–15.5)
RDW: 14.6 % (ref 11.5–15.5)
WBC: 8.8 10*3/uL (ref 4.0–10.5)
WBC: 9.8 10*3/uL (ref 4.0–10.5)

## 2017-11-12 LAB — BASIC METABOLIC PANEL
ANION GAP: 11 (ref 5–15)
BUN: 12 mg/dL (ref 6–20)
CALCIUM: 8.3 mg/dL — AB (ref 8.9–10.3)
CO2: 17 mmol/L — ABNORMAL LOW (ref 22–32)
Chloride: 107 mmol/L (ref 101–111)
Creatinine, Ser: 0.91 mg/dL (ref 0.61–1.24)
GLUCOSE: 88 mg/dL (ref 65–99)
Potassium: 3.8 mmol/L (ref 3.5–5.1)
SODIUM: 135 mmol/L (ref 135–145)

## 2017-11-12 SURGERY — GASTROJEJUNOSTOMY, LAPAROSCOPIC
Anesthesia: General | Site: Abdomen

## 2017-11-12 MED ORDER — FENTANYL CITRATE (PF) 100 MCG/2ML IJ SOLN
INTRAMUSCULAR | Status: DC | PRN
  Administered 2017-11-12 (×4): 50 ug via INTRAVENOUS

## 2017-11-12 MED ORDER — SUCCINYLCHOLINE CHLORIDE 200 MG/10ML IV SOSY
PREFILLED_SYRINGE | INTRAVENOUS | Status: AC
  Filled 2017-11-12: qty 10

## 2017-11-12 MED ORDER — FENTANYL CITRATE (PF) 100 MCG/2ML IJ SOLN
INTRAMUSCULAR | Status: AC
  Filled 2017-11-12: qty 2

## 2017-11-12 MED ORDER — SUCCINYLCHOLINE CHLORIDE 200 MG/10ML IV SOSY
PREFILLED_SYRINGE | INTRAVENOUS | Status: DC | PRN
  Administered 2017-11-12: 100 mg via INTRAVENOUS

## 2017-11-12 MED ORDER — LIDOCAINE 2% (20 MG/ML) 5 ML SYRINGE
INTRAMUSCULAR | Status: DC | PRN
Start: 2017-11-12 — End: 2017-11-12
  Administered 2017-11-12: 100 mg via INTRAVENOUS

## 2017-11-12 MED ORDER — ONDANSETRON HCL 4 MG/2ML IJ SOLN
INTRAMUSCULAR | Status: DC | PRN
  Administered 2017-11-12: 4 mg via INTRAVENOUS

## 2017-11-12 MED ORDER — FENTANYL CITRATE (PF) 100 MCG/2ML IJ SOLN
25.0000 ug | INTRAMUSCULAR | Status: DC | PRN
  Administered 2017-11-12 (×2): 50 ug via INTRAVENOUS

## 2017-11-12 MED ORDER — PROPOFOL 10 MG/ML IV BOLUS
INTRAVENOUS | Status: DC | PRN
  Administered 2017-11-12: 150 mg via INTRAVENOUS

## 2017-11-12 MED ORDER — ONDANSETRON HCL 4 MG/2ML IJ SOLN
INTRAMUSCULAR | Status: AC
  Filled 2017-11-12: qty 2

## 2017-11-12 MED ORDER — PROPOFOL 10 MG/ML IV BOLUS
INTRAVENOUS | Status: AC
  Filled 2017-11-12: qty 20

## 2017-11-12 MED ORDER — LIDOCAINE 2% (20 MG/ML) 5 ML SYRINGE
INTRAMUSCULAR | Status: DC | PRN
  Administered 2017-11-12: 1.5 mg/kg/h via INTRAVENOUS

## 2017-11-12 MED ORDER — 0.9 % SODIUM CHLORIDE (POUR BTL) OPTIME
TOPICAL | Status: DC | PRN
  Administered 2017-11-12: 1000 mL

## 2017-11-12 MED ORDER — BUPIVACAINE LIPOSOME 1.3 % IJ SUSP
20.0000 mL | Freq: Once | INTRAMUSCULAR | Status: DC
  Filled 2017-11-12: qty 20

## 2017-11-12 MED ORDER — DEXAMETHASONE SODIUM PHOSPHATE 10 MG/ML IJ SOLN
INTRAMUSCULAR | Status: AC
  Filled 2017-11-12: qty 1

## 2017-11-12 MED ORDER — SUGAMMADEX SODIUM 200 MG/2ML IV SOLN
INTRAVENOUS | Status: DC | PRN
  Administered 2017-11-12: 200 mg via INTRAVENOUS

## 2017-11-12 MED ORDER — LIDOCAINE 2% (20 MG/ML) 5 ML SYRINGE
INTRAMUSCULAR | Status: AC
  Filled 2017-11-12: qty 10

## 2017-11-12 MED ORDER — ROCURONIUM BROMIDE 10 MG/ML (PF) SYRINGE
PREFILLED_SYRINGE | INTRAVENOUS | Status: DC | PRN
  Administered 2017-11-12: 50 mg via INTRAVENOUS

## 2017-11-12 MED ORDER — BUPIVACAINE-EPINEPHRINE 0.25% -1:200000 IJ SOLN
INTRAMUSCULAR | Status: DC | PRN
  Administered 2017-11-12: 30 mL

## 2017-11-12 MED ORDER — ROCURONIUM BROMIDE 50 MG/5ML IV SOSY
PREFILLED_SYRINGE | INTRAVENOUS | Status: AC
  Filled 2017-11-12: qty 5

## 2017-11-12 MED ORDER — LACTATED RINGERS IV SOLN
INTRAVENOUS | Status: DC
  Administered 2017-11-12 (×3): via INTRAVENOUS

## 2017-11-12 MED ORDER — SODIUM CHLORIDE 0.9 % IV SOLN
510.0000 mg | Freq: Once | INTRAVENOUS | Status: AC
  Administered 2017-11-12: 510 mg via INTRAVENOUS
  Filled 2017-11-12: qty 17

## 2017-11-12 MED ORDER — PROMETHAZINE HCL 25 MG/ML IJ SOLN
6.2500 mg | INTRAMUSCULAR | Status: DC | PRN
Start: 2017-11-12 — End: 2017-11-12

## 2017-11-12 MED ORDER — SUGAMMADEX SODIUM 200 MG/2ML IV SOLN
INTRAVENOUS | Status: AC
  Filled 2017-11-12: qty 2

## 2017-11-12 MED ORDER — BUPIVACAINE-EPINEPHRINE (PF) 0.25% -1:200000 IJ SOLN
INTRAMUSCULAR | Status: AC
  Filled 2017-11-12: qty 30

## 2017-11-12 MED ORDER — CEFAZOLIN SODIUM-DEXTROSE 2-4 GM/100ML-% IV SOLN
INTRAVENOUS | Status: AC
  Filled 2017-11-12: qty 100

## 2017-11-12 MED ORDER — MORPHINE SULFATE (PF) 4 MG/ML IV SOLN
2.0000 mg | INTRAVENOUS | Status: DC | PRN
  Administered 2017-11-13: 4 mg via INTRAVENOUS
  Filled 2017-11-12: qty 1

## 2017-11-12 SURGICAL SUPPLY — 51 items
APPLIER CLIP 5 13 M/L LIGAMAX5 (MISCELLANEOUS)
APPLIER CLIP ROT 10 11.4 M/L (STAPLE)
BLADE EXTENDED COATED 6.5IN (ELECTRODE) IMPLANT
CLIP APPLIE 5 13 M/L LIGAMAX5 (MISCELLANEOUS) IMPLANT
CLIP APPLIE ROT 10 11.4 M/L (STAPLE) IMPLANT
COVER MAYO STAND STRL (DRAPES) ×3 IMPLANT
DECANTER SPIKE VIAL GLASS SM (MISCELLANEOUS) ×3 IMPLANT
DERMABOND ADVANCED (GAUZE/BANDAGES/DRESSINGS)
DERMABOND ADVANCED .7 DNX12 (GAUZE/BANDAGES/DRESSINGS) IMPLANT
DRSG OPSITE POSTOP 4X6 (GAUZE/BANDAGES/DRESSINGS) ×3 IMPLANT
ELECT PENCIL ROCKER SW 15FT (MISCELLANEOUS) IMPLANT
ELECT REM PT RETURN 15FT ADLT (MISCELLANEOUS) ×3 IMPLANT
GAUZE SPONGE 4X4 12PLY STRL (GAUZE/BANDAGES/DRESSINGS) ×3 IMPLANT
GLOVE BIO SURGEON STRL SZ 6.5 (GLOVE) ×4 IMPLANT
GLOVE BIO SURGEONS STRL SZ 6.5 (GLOVE) ×2
GLOVE BIOGEL PI IND STRL 7.0 (GLOVE) ×1 IMPLANT
GLOVE BIOGEL PI INDICATOR 7.0 (GLOVE) ×2
GOWN STRL REUS W/TWL 2XL LVL3 (GOWN DISPOSABLE) ×3 IMPLANT
GOWN STRL REUS W/TWL XL LVL3 (GOWN DISPOSABLE) ×6 IMPLANT
HANDLE SUCTION POOLE (INSTRUMENTS) IMPLANT
IRRIG SUCT STRYKERFLOW 2 WTIP (MISCELLANEOUS) ×3
IRRIGATION SUCT STRKRFLW 2 WTP (MISCELLANEOUS) ×1 IMPLANT
PACK COLON (CUSTOM PROCEDURE TRAY) ×3 IMPLANT
SEALER TISSUE G2 STRG ARTC 35C (ENDOMECHANICALS) IMPLANT
SEALER TISSUE X1 CVD JAW (INSTRUMENTS) IMPLANT
SLEEVE XCEL OPT CAN 5 100 (ENDOMECHANICALS) ×3 IMPLANT
SPONGE LAP 18X18 X RAY DECT (DISPOSABLE) IMPLANT
STAPLER PROXIMATE 75MM BLUE (STAPLE) ×3 IMPLANT
STAPLER VISISTAT 35W (STAPLE) IMPLANT
SUCTION POOLE HANDLE (INSTRUMENTS)
SUT NOVA 1 T20/GS 25DT (SUTURE) IMPLANT
SUT NOVA NAB DX-16 0-1 5-0 T12 (SUTURE) ×6 IMPLANT
SUT PDS AB 1 TP1 96 (SUTURE) IMPLANT
SUT PROLENE 2 0 KS (SUTURE) IMPLANT
SUT PROLENE 2 0 SH DA (SUTURE) IMPLANT
SUT SILK 2 0 (SUTURE) ×2
SUT SILK 2 0 SH CR/8 (SUTURE) ×9 IMPLANT
SUT SILK 2 0SH CR/8 30 (SUTURE) ×6 IMPLANT
SUT SILK 2-0 18XBRD TIE 12 (SUTURE) ×1 IMPLANT
SUT SILK 3 0 (SUTURE) ×2
SUT SILK 3 0 SH CR/8 (SUTURE) ×6 IMPLANT
SUT SILK 3-0 18XBRD TIE 12 (SUTURE) ×1 IMPLANT
SUT VIC AB 2-0 SH 18 (SUTURE) ×3 IMPLANT
SUT VIC AB 4-0 PS2 27 (SUTURE) ×6 IMPLANT
TOWEL OR 17X26 10 PK STRL BLUE (TOWEL DISPOSABLE) IMPLANT
TOWEL OR NON WOVEN STRL DISP B (DISPOSABLE) ×3 IMPLANT
TRAY FOLEY W/METER SILVER 16FR (SET/KITS/TRAYS/PACK) ×3 IMPLANT
TROCAR BLADELESS OPT 5 100 (ENDOMECHANICALS) ×3 IMPLANT
TROCAR XCEL BLUNT TIP 100MML (ENDOMECHANICALS) IMPLANT
TROCAR XCEL NON-BLD 11X100MML (ENDOMECHANICALS) IMPLANT
YANKAUER SUCT BULB TIP 10FT TU (MISCELLANEOUS) ×3 IMPLANT

## 2017-11-12 NOTE — Progress Notes (Signed)
Patient ID: Lee Pratt, male   DOB: 14-Nov-1934, 83 y.o.   MRN: [id number]    PROGRESS NOTE  Lee Pratt  [id number] DOB: 16-Jan-1934 DOA: 11/09/2017  PCP: Lee Gravel, MD   Brief Narrative:  Patient is 83 year old male who presented from primary care physician office for further evaluation of significant anemia, hemoglobin 8.5. Patient reports approximately 10 pound weight loss in the past 6 weeks associated with poor oral intake, malaise and fatigue. Patient reports several days duration of progressively worsening nausea and abdominal bloating but denies any vomiting.  In emergency department, hemoglobin 7.8, FOBT negative, patient was orthostatic. CT scan notable for circumferential wall thickening involving the gastric antrum and marked distention of the stomach suggestive of an element of gastric outlet obstruction, recommendation for endoscopy.  Assessment & Plan:   Principal Problem:   Acute blood loss anemia, GI bleed, question gastric outlet obstruction - EGD done, large antrum mass noted, suspect malignant, biopsied - Awaiting for biopsies - Checking pre-albumin, if low plan on starting TNA - transfused two U PRBC 11/11 and Hg up appropriately and remains stable this AM - plan to take to OR for diagnostic lap and J tube placement   Active Problems:   HTN (hypertension) - Reasonable inpatient control  DVT prophylaxis:  SCDs  Code Status:  full code  Family Communication: pt and family at bedside  Disposition Plan: To be determined   Consultants:   GI   Surgery team   Procedures:   EGD 11/10/2017  Antimicrobials:   Pre op abx only   Subjective: Pt denies concerns this AM.   Objective: Vitals:   11/11/17 1700 11/11/17 2144 11/12/17 0411 11/12/17 0815  BP: 122/75 (!) 149/89 (!) 144/70 (!) 142/70  Pulse: 88 76 83 81  Resp: 18 16 16 17   Temp: 98 F (36.7 C) 97.9 F (36.6 C) 98.1 F (36.7 C) 98.3 F (36.8 C)  TempSrc: Oral Oral Oral Oral  SpO2: 100% 100% 97%  97%  Weight:      Height:        Intake/Output Summary (Last 24 hours) at 11/12/2017 0955 Last data filed at 11/12/2017 0600 Gross per 24 hour  Intake 2760 ml  Output -  Net 2760 ml   Filed Weights   11/09/17 1612  Weight: 59.2 kg (130 lb 8.2 oz)    Physical Exam  Constitutional: Appears calm, NAD CVS: RRR, S1/S2 +, no murmurs, no gallops, no carotid bruit.  Pulmonary: Effort and breath sounds normal, no stridor, rhonchi, wheezes, rales.  Abdominal: Soft. BS +,  no distension, tenderness, rebound or guarding.  Neuro: Alert. Normal reflexes, muscle tone coordination. No cranial nerve deficit. Psychiatric: Normal mood and affect. Behavior, judgment, thought content normal.   Data Reviewed: I have personally reviewed following labs and imaging studies  CBC: Recent Labs  Lab 11/08/17 1603 11/09/17 0931 11/10/17 0504 11/11/17 0449 11/12/17 0400  WBC 10.5* 8.2 5.4 8.8 9.8  HGB 8.5* 7.8* 7.0* 9.7* 10.3*  HCT 26.1* 24.0* 21.1* 29.2* 32.2*  MCV 89.3 88.9 88.3 86.1 87.0  PLT  --  395 292 342 536   Basic Metabolic Panel: Recent Labs  Lab 11/08/17 1656 11/09/17 0931 11/10/17 0504 11/11/17 0449 11/12/17 0400  NA 134 134* 137 136 135  K 4.0 3.4* 3.8 3.9 3.8  CL 99 102 106 108 107  CO2 18* 23 21* 18* 17*  GLUCOSE 125* 113* 86 80 88  BUN 22 20 18 12 12   CREATININE 1.04 1.05 0.92 0.93  0.91  CALCIUM 9.0 8.6* 8.1* 8.2* 8.3*  MG  --   --  2.1  --   --    Liver Function Tests: Recent Labs  Lab 11/08/17 1656 11/09/17 0931 11/10/17 0504  AST 23 28 20   ALT 13 16* 14*  ALKPHOS 70 56 49  BILITOT 0.3 0.5 0.8  PROT 6.6 6.6 5.5*  ALBUMIN 4.0 3.2* 2.7*   Recent Labs  Lab 11/08/17 1656 11/09/17 0931  LIPASE 27 26   Anemia Panel: Recent Labs    11/09/17 1633  TIBC 377  IRON 11*   Urine analysis:    Component Value Date/Time   COLORURINE YELLOW 11/09/2017 [place] 11/09/2017 1202   LABSPEC 1.006 11/09/2017 1202   PHURINE 6.0 11/09/2017 [place] 11/09/2017 [place] 11/09/2017 [place] 11/09/2017 1202   KETONESUR 5 (A) 11/09/2017 1202   PROTEINUR NEGATIVE 11/09/2017 1202   NITRITE NEGATIVE 11/09/2017 [place] 11/09/2017 1202   Radiology Studies: No results found. Scheduled Meds: . [MAR Hold] feeding supplement  1 Container Oral TID BM  . [MAR Hold] pantoprazole (PROTONIX) IV  40 mg Intravenous Q12H   Continuous Infusions: . sodium chloride Stopped (11/12/17 0916)  . ceFAZolin    . [MAR Hold]  ceFAZolin (ANCEF) IV    . lactated ringers 50 mL/hr at 11/12/17 0910     LOS: 2 days    Time spent: 15 minutes    Lee Ramsay, MD [facility] Pager [phone]  If 7PM-7AM, please contact night-coverage [email or web address] Password [email or web address] 11/12/2017, 9:55 AM

## 2017-11-12 NOTE — Anesthesia Procedure Notes (Addendum)
Procedure Name: Intubation Date/Time: 11/12/2017 10:23 AM Performed by: [place] Sharif Rendell C, CRNA Pre-anesthesia Checklist: Patient identified, Emergency Drugs available, Suction available and Patient being monitored Patient Re-evaluated:Patient Re-evaluated prior to induction Oxygen Delivery Method: Circle system utilized Preoxygenation: Pre-oxygenation with 100% oxygen Induction Type: IV induction, Rapid sequence and Cricoid Pressure applied Ventilation: Mask ventilation without difficulty Laryngoscope Size: Mac and 4 Grade View: Grade II Tube type: Oral Tube size: 7.5 mm Number of attempts: 1 Airway Equipment and Method: Stylet and Oral airway Placement Confirmation: ETT inserted through vocal cords under direct vision,  positive ETCO2 and breath sounds checked- equal and bilateral Tube secured with: Tape Dental Injury: Teeth and Oropharynx as per pre-operative assessment

## 2017-11-12 NOTE — Progress Notes (Signed)
Taking over care of patient agree with previous RN assessment. Denies any needs at this time will continue to monitor. 

## 2017-11-12 NOTE — Progress Notes (Signed)
Pathology from EGD biopsies confirms gastric adenocarcinoma.

## 2017-11-12 NOTE — Anesthesia Postprocedure Evaluation (Signed)
Anesthesia Post Note  Patient: Cornelis Kluver  Procedure(s) Performed: Diagnostic Laparoscopy with biopsy, gastojejunostomy, insertion of G-tube and J-tube (N/A Abdomen)     Patient location during evaluation: PACU Anesthesia Type: General Level of consciousness: awake and alert Pain management: pain level controlled Vital Signs Assessment: post-procedure vital signs reviewed and stable Respiratory status: spontaneous breathing, nonlabored ventilation, respiratory function stable and patient connected to nasal cannula oxygen Cardiovascular status: blood pressure returned to baseline and stable Postop Assessment: no apparent nausea or vomiting Anesthetic complications: no    Last Vitals:  Vitals:   11/12/17 1430 11/12/17 1444  BP:  (!) 144/62  Pulse:  83  Resp:  15  Temp: 36.7 C 36.9 C  SpO2:  99%    Last Pain:  Vitals:   11/12/17 1430  TempSrc:   PainSc: 0-No pain                 Effie Berkshire

## 2017-11-12 NOTE — Op Note (Addendum)
11/09/2017 - 11/12/2017  12:34 PM  PATIENT:  Lee Pratt  83 y.o. male  Patient Care Team: [name], MD as PCP - General (Internal Medicine)  PRE-OPERATIVE DIAGNOSIS:  Gastric Outlet Obstruction  POST-OPERATIVE DIAGNOSIS:  Stage 4 Gastric Cancer  PROCEDURE:   Diagnostic Laparoscopy with biopsy, gastojejunostomy, insertion of G-tube and J-tube   Surgeon(s): [name], MD  ASSISTANT: Obie Dredge, PA   ANESTHESIA:   general  EBL:  Total I/O In: -  Out: 1200 [Other:1200]  DRAINS: Gastrostomy Tube and Jejunostomy Tube   SPECIMEN:  Source of Specimen:  peritoneal nodules  DISPOSITION OF SPECIMEN:  PATHOLOGY  COUNTS:  YES  PLAN OF CARE: Pt admitted already  PATIENT DISPOSITION:  PACU - hemodynamically stable.  INDICATION: 83 y.o. M with gastric outlet obstruction and distal mass Pratt on upper endo.   OR FINDINGS: Carcinomatosis of the upper abdomen with peritoneal nodules noted along the outside of the gastric fundus, falciform ligament and on the right upper quadrant peritoneum.  DESCRIPTION: the patient was identified in the preoperative holding area and taken to the OR where they were laid supine on the operating room table.  General anesthesia was induced without difficulty. SCDs were also noted to be in place prior to the initiation of anesthesia.  The patient was then prepped and draped in the usual sterile fashion.   A surgical timeout was performed indicating the correct patient, procedure, positioning and need for preoperative antibiotics.   I began by making a supraumbilical incision.  Dissection was carried down through subcutaneous tissues and the fascia was divided with electrocautery.  The peritoneum was then entered bluntly.  An Hand wound protector was placed.  I placed a port over the Alexis and insufflated the abdomen to approximately 15 mmHg.  I then evaluated all 4 quadrants of the abdomen.  Peritoneal nodules were noted along the surface of  the gastric antrum and duodenum.  There was also nodules noted on the falciform ligament and on the peritoneum over the right side of the liver.  Several of these nodules were removed for frozen biopsy.  This did come back as carcinomatosis.  I left the operating room and discussed this with the family.  We decided to proceed with a palliative gastrojejunostomy and drainage and feeding tubes.  I return to the operating room and enlarged my upper midline incision with Bovie electrocautery.    I Identified the proximal limb of the jejunum.  This was brought up to the base of the stomach in an antecolic fashion.  An anastomosis was created with a GIA 75 mm blue load stapler.  The common enterotomy channel was closed using interrupted 2-0 silk sutures.  Once this was completed identified a portion of the stomach proximal to the gastrojejunostomy and a pursestring 2-0 silk suture was placed.  I then placed a gastrostomy tube through the abdominal wall and into the stomach.  The pursestring was tied tightly around this and the stomach was then pexied to the abdominal wall using 2-0 silk sutures.  I then found a portion of jejunum distal to the anastomosis and brought this out lower on the abdominal wall.  A jejunostomy tube was placed inside the abdomen through the previous 5 mm port site.  This was then placed into the jejunum and a pursestring 2-0 silk suture was tied tightly around this.  This was also pexied to the abdominal wall using 2-0 silk interrupted sutures.  Both balloons were inflated.  The J-tube balloon  was inflated with 5 mL of normal saline and the G-tube was inflated with 10 mL of normal saline.  These were then secured to the skin using interrupted 2-0 silk sutures.  Once this was complete, I brought the omentum up over the anastomosis and sutured this to the abdominal wall using a 3-0 silk suture.  The fascia was then closed using interrupted #1 Novafil sutures.  The subcutaneous tissue was  reapproximated using interrupted 2-0 Vicryl sutures.  The skin was closed using a running 4-0 Vicryl suture.  A sterile dressing was applied.  The patient was awakened from anesthesia and sent to the postanesthesia care unit in stable condition.  All counts were correct per operating room staff.

## 2017-11-12 NOTE — Plan of Care (Signed)
  Progressing Health Behavior/Discharge Planning: Ability to manage health-related needs will improve 11/12/2017 2206 - Progressing by Talbert Forest, RN Clinical Measurements: Ability to maintain clinical measurements within normal limits will improve 11/12/2017 2206 - Progressing by Talbert Forest, RN Will remain free from infection 11/12/2017 2206 - Progressing by Talbert Forest, RN Diagnostic test results will improve 11/12/2017 2206 - Progressing by Talbert Forest, RN Respiratory complications will improve 11/12/2017 2206 - Progressing by Talbert Forest, RN Cardiovascular complication will be avoided 11/12/2017 2206 - Progressing by Talbert Forest, RN Activity: Risk for activity intolerance will decrease 11/12/2017 2206 - Progressing by Talbert Forest, RN Nutrition: Adequate nutrition will be maintained 11/12/2017 2206 - Progressing by Talbert Forest, RN Coping: Level of anxiety will decrease 11/12/2017 2206 - Progressing by Talbert Forest, RN Elimination: Will not experience complications related to bowel motility 11/12/2017 2206 - Progressing by Talbert Forest, RN Will not experience complications related to urinary retention 11/12/2017 2206 - Progressing by Talbert Forest, RN Skin Integrity: Risk for impaired skin integrity will decrease 11/12/2017 2206 - Progressing by Talbert Forest, RN

## 2017-11-12 NOTE — Anesthesia Preprocedure Evaluation (Addendum)
Anesthesia Evaluation  Patient identified by MRN, date of birth, ID band Patient awake    Reviewed: Allergy & Precautions, NPO status , Patient's Chart, lab work & pertinent test results  Airway Mallampati: I  TM Distance: >3 FB Neck ROM: Full    Dental  (+) Teeth Intact, Dental Advisory Given   Pulmonary neg pulmonary ROS,    breath sounds clear to auscultation       Cardiovascular hypertension, Pt. on medications  Rhythm:Regular Rate:Normal     Neuro/Psych negative neurological ROS     GI/Hepatic negative GI ROS, Neg liver ROS,   Endo/Other  negative endocrine ROS  Renal/GU negative Renal ROS     Musculoskeletal negative musculoskeletal ROS (+)   Abdominal   Peds  Hematology negative hematology ROS (+)   Anesthesia Other Findings Day of surgery medications reviewed with the patient.  Reproductive/Obstetrics                            Anesthesia Physical Anesthesia Plan  ASA: II  Anesthesia Plan: General   Post-op Pain Management:    Induction: Intravenous, Rapid sequence and Cricoid pressure planned  PONV Risk Score and Plan: Ondansetron and Dexamethasone  Airway Management Planned: Oral ETT  Additional Equipment:   Intra-op Plan:   Post-operative Plan: Extubation in OR  Informed Consent: I have reviewed the patients History and Physical, chart, labs and discussed the procedure including the risks, benefits and alternatives for the proposed anesthesia with the patient or authorized representative who has indicated his/her understanding and acceptance.   Dental advisory given  Plan Discussed with: CRNA  Anesthesia Plan Comments:         Anesthesia Quick Evaluation

## 2017-11-12 NOTE — Progress Notes (Addendum)
Received pt from PACU, VSs, restarted monitor, pt sleepy, respond to verbal commands, pt abd soft and flat with faint hypoactive BS. GT to straight drain, J-tube clamped. Foley patent. IS @ bedside, wife and son with patient. SRP, RN

## 2017-11-12 NOTE — Transfer of Care (Signed)
Immediate Anesthesia Transfer of Care Note  Patient: Lee Pratt  Procedure(s) Performed: Diagnostic Laparoscopy with biopsy, gastojejunostomy, insertion of G-tube and J-tube (N/A Abdomen)  Patient Location: PACU  Anesthesia Type:General  Level of Consciousness: awake  Airway & Oxygen Therapy: Patient Spontanous Breathing and Patient connected to face mask oxygen  Post-op Assessment: Report given to RN and Post -op Vital signs reviewed and stable  Post vital signs: Reviewed and stable  Last Vitals:  Vitals:   11/12/17 0411 11/12/17 0815  BP: (!) 144/70 (!) 142/70  Pulse: 83 81  Resp: 16 17  Temp: 36.7 C 36.8 C  SpO2: 97% 97%    Last Pain:  Vitals:   11/12/17 0845  TempSrc:   PainSc: 0-No pain         Complications: No apparent anesthesia complications

## 2017-11-12 NOTE — Progress Notes (Signed)
Partial gastric outlet obstruction  Subjective: Pt tolerating clears  Objective: Vital signs in last 24 hours: Temp:  [97.9 F (36.6 C)-98.1 F (36.7 C)] 98.1 F (36.7 C) (11/13 0411) Pulse Rate:  [76-88] 83 (11/13 0411) Resp:  [16-18] 16 (11/13 0411) BP: (122-149)/(70-89) 144/70 (11/13 0411) SpO2:  [97 %-100 %] 97 % (11/13 0411) Last BM Date: 11/11/17  Intake/Output from previous day: 11/12 0701 - 11/13 0700 In: 2760 [P.O.:960; I.V.:1800] Out: -  Intake/Output this shift: No intake/output data recorded.  General appearance: alert and cooperative GI: soft  Lab Results:  Results for orders placed or performed during the hospital encounter of 11/09/17 (from the past 24 hour(s))  Surgical pcr screen     Status: None   Collection Time: 11/11/17  1:18 PM  Result Value Ref Range   MRSA, PCR NEGATIVE NEGATIVE   Staphylococcus aureus NEGATIVE NEGATIVE  CBC     Status: Abnormal   Collection Time: 11/12/17  4:00 AM  Result Value Ref Range   WBC 9.8 4.0 - 10.5 K/uL   RBC 3.70 (L) 4.22 - 5.81 MIL/uL   Hemoglobin 10.3 (L) 13.0 - 17.0 g/dL   HCT 32.2 (L) 39.0 - 52.0 %   MCV 87.0 78.0 - 100.0 fL   MCH 27.8 26.0 - 34.0 pg   MCHC 32.0 30.0 - 36.0 g/dL   RDW 14.6 11.5 - 15.5 %   Platelets 368 150 - 400 K/uL  Basic metabolic panel     Status: Abnormal   Collection Time: 11/12/17  4:00 AM  Result Value Ref Range   Sodium 135 135 - 145 mmol/L   Potassium 3.8 3.5 - 5.1 mmol/L   Chloride 107 101 - 111 mmol/L   CO2 17 (L) 22 - 32 mmol/L   Glucose, Bld 88 65 - 99 mg/dL   BUN 12 6 - 20 mg/dL   Creatinine, Ser 0.91 0.61 - 1.24 mg/dL   Calcium 8.3 (L) 8.9 - 10.3 mg/dL   GFR calc non Af Amer >60 >60 mL/min   GFR calc Af Amer >60 >60 mL/min   Anion gap 11 5 - 15     Studies/Results Radiology     MEDS, Scheduled . feeding supplement  1 Container Oral TID BM  . pantoprazole (PROTONIX) IV  40 mg Intravenous Q12H     Assessment: Partial gastric outlet obstruction Presumed  malignancy  Plan: OR today for diagnostic laparoscopy and J tube placement.  Risks include bleeding, bowel blockage, leak, J tube blockage and need for additional surgery.  I believe he understands this and agrees.  All questions were answered.     Cont workup for GI malignancy once path available   LOS: 2 days    Rosario Adie, MD [facility], [place] [phone]   11/12/2017 7:59 AM

## 2017-11-13 DIAGNOSIS — D649 Anemia, unspecified: Secondary | ICD-10-CM

## 2017-11-13 LAB — BASIC METABOLIC PANEL
Anion gap: 12 (ref 5–15)
BUN: 13 mg/dL (ref 6–20)
CALCIUM: 8.3 mg/dL — AB (ref 8.9–10.3)
CO2: 15 mmol/L — AB (ref 22–32)
CREATININE: 1.03 mg/dL (ref 0.61–1.24)
Chloride: 108 mmol/L (ref 101–111)
GFR calc Af Amer: >60 mL/min (ref >60)
GFR calc non Af Amer: >60 mL/min (ref >60)
GLUCOSE: 97 mg/dL (ref 65–99)
Potassium: 4.1 mmol/L (ref 3.5–5.1)
Sodium: 135 mmol/L (ref 135–145)

## 2017-11-13 LAB — CBC
HEMATOCRIT: 29.9 % — AB (ref 39.0–52.0)
Hemoglobin: 10 g/dL — ABNORMAL LOW (ref 13.0–17.0)
MCH: 28.8 pg (ref 26.0–34.0)
MCHC: 33.4 g/dL (ref 30.0–36.0)
MCV: 86.2 fL (ref 78.0–100.0)
Platelets: 358 10*3/uL (ref 150–400)
RBC: 3.47 MIL/uL — ABNORMAL LOW (ref 4.22–5.81)
RDW: 14.9 % (ref 11.5–15.5)
WBC: 12.2 10*3/uL — ABNORMAL HIGH (ref 4.0–10.5)

## 2017-11-13 NOTE — Progress Notes (Signed)
[facility] Progress Note  1 Day Post-Op  Subjective: CC:  Having mild abdominal soreness that is worse with sitting up/coughing. Denies nausea, vomiting, belching. Denies BM. Family is at bedside (wife and son). Patient aware of cancer diagnosis and plans to talk to oncology today.   Objective: Vital signs in last 24 hours: Temp:  [98 F (36.7 C)-98.8 F (37.1 C)] 98.4 F (36.9 C) (11/14 0450) Pulse Rate:  [78-83] 81 (11/14 0450) Resp:  [13-18] 18 (11/14 0450) BP: (136-144)/(61-66) 136/64 (11/14 0450) SpO2:  [97 %-100 %] 97 % (11/14 0450) Last BM Date: 11/11/17  Intake/Output from previous day: 11/13 0701 - 11/14 0700 In: 2775.8 [I.V.:2598.8; IV Piggyback:117] Out: 9528 [Urine:1025; Drains:575; Blood:25] Intake/Output this shift: No intake/output data recorded.  PE: Gen:  Alert, NAD, pleasant Card:  Regular rate and rhythm Pulm:  Normal effort   Abd: Soft, non-tender, bowel sounds present, gastrostomy tube in place, draining bilious fluid to gravity. J-tube capped. Midline incision with honeycomb in place Skin: warm and dry, no rashes  Psych: A&Ox3   Lab Results:  Recent Labs    11/12/17 0400 11/13/17 0416  WBC 9.8 12.2*  HGB 10.3* 10.0*  HCT 32.2* 29.9*  PLT 368 358   BMET Recent Labs    11/12/17 0400 11/13/17 0416  NA 135 135  K 3.8 4.1  CL 107 108  CO2 17* 15*  GLUCOSE 88 97  BUN 12 13  CREATININE 0.91 1.03  CALCIUM 8.3* 8.3*   PT/INR No results for input(s): LABPROT, INR in the last 72 hours. CMP     Component Value Date/Time   NA 135 11/13/2017 0416   NA 134 11/08/2017 1656   K 4.1 11/13/2017 0416   CL 108 11/13/2017 0416   CO2 15 (L) 11/13/2017 0416   GLUCOSE 97 11/13/2017 0416   BUN 13 11/13/2017 0416   BUN 22 11/08/2017 1656   CREATININE 1.03 11/13/2017 0416   CREATININE 0.99 07/20/2013 0941   CALCIUM 8.3 (L) 11/13/2017 0416   PROT 5.5 (L) 11/10/2017 0504   PROT 6.6 11/08/2017 1656   ALBUMIN 2.7 (L) 11/10/2017 0504   ALBUMIN 4.0 11/08/2017 1656   AST 20 11/10/2017 0504   ALT 14 (L) 11/10/2017 0504   ALKPHOS 49 11/10/2017 0504   BILITOT 0.8 11/10/2017 0504   BILITOT 0.3 11/08/2017 1656   GFRNONAA >60 11/13/2017 0416   GFRAA >60 11/13/2017 0416   Lipase     Component Value Date/Time   LIPASE 26 11/09/2017 0931   Studies/Results: No results found.  Anti-infectives: Anti-infectives (From admission, onward)   Start     Dose/Rate Route Frequency Ordered Stop   11/12/17 1230  ceFAZolin (ANCEF) IVPB 2g/100 mL premix     2 g 200 mL/hr over 30 Minutes Intravenous On call to O.R. 11/11/17 1055 11/12/17 1026   11/12/17 0913  ceFAZolin (ANCEF) 2-4 GM/100ML-% IVPB    Comments:  Bridget Hartshorn   : cabinet override      11/12/17 0913 11/12/17 1026   11/11/17 1130  ceFAZolin (ANCEF) IVPB 2g/100 mL premix  Status:  Discontinued     2 g 200 mL/hr over 30 Minutes Intravenous On call to O.R. 11/11/17 1045 11/11/17 1056     Assessment/Plan GOO 2/2 stage 4 gastric adenocarcinoma  S/P Diagnostic Laparoscopy with biopsy, gastojejunostomy, insertion of G-tube and J-tube - intraoperatively peritoneal nodules were removed for frozen biopsy and tested positive for carcinomatosis.  - continue G-tube to gravity, cap J-tube; may be able to  start trickle tube feeds in 24h - Recommend oncology consult  - pain control, mobilize, IS   FEN: NPO, IVF ID: perioperative Ancef 11/13  VTE: SCD's, Plan to resume chemical VTE tomorrow if remains hemodynamically stable. Foley: scant amount of hematuria when foley placed in OR, will continue foley today and plan to d/c on POD#2     LOS: 3 days    Jill Alexanders , [facility] 11/13/2017, 10:08 AM Pager: [phone] Consults: [phone] Mon-Fri 7:00 am-4:30 pm Sat-Sun 7:00 am-11:30 am

## 2017-11-13 NOTE — Evaluation (Signed)
Physical Therapy Evaluation Patient Details Name: Lee Pratt MRN: [id number] DOB: Dec 28, 1934 Today's Date: 11/13/2017   History of Present Illness  83 yo male admitted with gastric outlet obstruction. Found to have gastric adenocarcinoma. S/P gastrojejunostomy, G tube placement, J tube placement 11/12/17    Clinical Impression  On eval, pt required Min assist for mobility. He walked ~250 feet with a RW around the unit. Pt denied pain during session. Wife and son were present. Son assisted with some translation, when needed. Pt speaks and understands some english. Discussed d/c plan-pt plans to return home with his wife. His wife reports that she cannot provide any physical assistance.     Follow Up Recommendations Home health PT; Home Health OT; Home Health Aide; Supervision/Assistance - 24 hour    Equipment Recommendations  None recommended by PT    Recommendations for Other Services       Precautions / Restrictions Precautions Precautions: Fall Precaution Comments: G tube, J tube Restrictions Weight Bearing Restrictions: No      Mobility  Bed Mobility Overal bed mobility: Needs Assistance Bed Mobility: Supine to Sit     Supine to sit: Min assist;HOB elevated     General bed mobility comments: Assist for trunk. Multimodal cueing. Pt preferred to have therapist provide a hand for him to pull up on.   Transfers Overall transfer level: Needs assistance Equipment used: Rolling walker (2 wheeled) Transfers: Sit to/from Stand Sit to Stand: Min guard         General transfer comment: close guard for safety. VCs and cueing for safety, hand placement.   Ambulation/Gait Ambulation/Gait assistance: Min guard Ambulation Distance (Feet): 250 Feet Assistive device: Rolling walker (2 wheeled) Gait Pattern/deviations: Step-through pattern;Decreased stride length     General Gait Details: close guard for safety. steady with RW.   Stairs            Wheelchair Mobility     Modified Rankin (Stroke Patients Only)       Balance                                             Pertinent Vitals/Pain Pain Assessment: No/denies pain    Home Living Family/patient expects to be discharged to:: Private residence Living Arrangements: Spouse/significant other(wife is unable to provide any physical assistance) Available Help at Discharge: Family;Available 24 hours/day Type of Home: Apartment Home Access: Level entry     Home Layout: One level Home Equipment: Walker - 2 wheels;Cane - single point      Prior Function Level of Independence: Independent with assistive device(s)         Comments: uses cane for ambulation     Hand Dominance        Extremity/Trunk Assessment   Upper Extremity Assessment Upper Extremity Assessment: Overall WFL for tasks assessed    Lower Extremity Assessment Lower Extremity Assessment: Generalized weakness    Cervical / Trunk Assessment Cervical / Trunk Assessment: Kyphotic  Communication   Communication: Interpreter utilized;Prefers language other than English;HOH(pt can understand and speak some English)  Cognition Arousal/Alertness: Awake/alert Behavior During Therapy: WFL for tasks assessed/performed Overall Cognitive Status: Difficult to assess                                 General Comments: some communication difficulties due hearing loss  and first language being [place] Comments      Exercises     Assessment/Plan    PT Assessment Patient needs continued PT services  PT Problem List Decreased balance;Decreased strength;Decreased mobility       PT Treatment Interventions DME instruction;Gait training;Functional mobility training;Therapeutic activities;Therapeutic exercise;Patient/family education;Balance training    PT Goals (Current goals can be found in the Care Plan section)  Acute Rehab PT Goals Patient Stated Goal: home. regain  independence PT Goal Formulation: With patient/family Time For Goal Achievement: 11/27/17 Potential to Achieve Goals: Good    Frequency Min 3X/week   Barriers to discharge        Co-evaluation               AM-PAC PT "6 Clicks" Daily Activity  Outcome Measure Difficulty turning over in bed (including adjusting bedclothes, sheets and blankets)?: Unable Difficulty moving from lying on back to sitting on the side of the bed? : Unable Difficulty sitting down on and standing up from a chair with arms (e.g., wheelchair, bedside commode, etc,.)?: Unable Help needed moving to and from a bed to chair (including a wheelchair)?: A Little Help needed walking in hospital room?: A Little Help needed climbing 3-5 steps with a railing? : A Little 6 Click Score: 12    End of Session   Activity Tolerance: Patient tolerated treatment well Patient left: in chair;with call bell/phone within reach;with family/visitor present   PT Visit Diagnosis: Muscle weakness (generalized) (M62.81);Difficulty in walking, not elsewhere classified (R26.2)    Time: 7711-6579 PT Time Calculation (min) (ACUTE ONLY): 21 min   Charges:   PT Evaluation $PT Eval Moderate Complexity: 1 Mod     PT G Codes:          Weston Anna, MPT Pager: [phone]

## 2017-11-13 NOTE — Care Management Note (Signed)
Case Management Note  Patient Details  Name: Lee Pratt MRN: [id number] Date of Birth: 1934-01-29  Subjective/Objective: PT/OT recc HHC-provided son w/HHC agency list-await choice.                   Action/Plan:d/c home w/HHC.   Expected Discharge Date:  11/13/17               Expected Discharge Plan:  [place]  In-House Referral:     Discharge planning Services  CM Consult  Post Acute Care Choice:    Choice offered to:  Adult Children  DME Arranged:    DME Agency:     HH Arranged:    HH Agency:     Status of Service:  In process, will continue to follow  If discussed at Long Length of Stay Meetings, dates discussed:    Additional Comments:  Dessa Phi, RN 11/13/2017, 3:52 PM

## 2017-11-13 NOTE — Progress Notes (Signed)
Patient ID: Lee Pratt, male   DOB: [birth year], 83 y.o.   MRN: [id number]    PROGRESS NOTE  Standley Bargo  LNL:[id number] DOB: Apr 06, 1934 DOA: 11/09/2017  PCP: [name], MD   Brief Narrative:  Patient is 83 year old male who presented from primary care physician office for further evaluation of significant anemia, hemoglobin 8.5. Patient reports approximately 10 pound weight loss in the past 6 weeks associated with poor oral intake, malaise and fatigue. Patient reports several days duration of progressively worsening nausea and abdominal bloating but denies any vomiting.  In emergency department, hemoglobin 7.8, FOBT negative, patient was orthostatic. CT scan notable for circumferential wall thickening involving the gastric antrum and marked distention of the stomach suggestive of an element of gastric outlet obstruction, recommendation for endoscopy.  Assessment & Plan:   Principal Problem:   Acute blood loss anemia, GI bleed: due to adenocarcinoma of stomach - EGD done, large antrum mass noted, suspected to be malignant, biopsies demonstrated adenocarcinoma. -appreciate assistance and help from CCS; biopsies taken during laparoscopy demonstrating positive carcinomatosis and essentially stage 4 malignant adenocarcinoma. -will discussed with oncology and follow rec's -will initiate feeding through J tube once clear by CCS - transfused two U PRBC 11/11 and IV iron infusion on 11/13; Hgb stable and up appropriately, 10.0 -will follow CBC trend intermittent -continue supportive care.  Active Problems:   HTN (hypertension) -stable and well controlled -will monitor and if needed will start IV PRN hydralazine.  DVT prophylaxis:  SCDs  Code Status:  full code  Family Communication: pt and family at bedside  Disposition Plan: To be determined; will contact oncology service to discussed with patient and family options of treatment and further needed work up.  Consultants:   GI   Surgery team    Procedures:   EGD 11/10/2017  Diagnostic laparoscopy with biopsy, G tube placement and J tube.  Antimicrobials:   Pre op abx only   Subjective: Afebrile, no CP, no SOB. Reports no abd pain, no nausea and no vomiting. G tube draining bilious/gastric fluid only.  Objective: Vitals:   11/12/17 2111 11/13/17 0450 11/13/17 1453 11/13/17 2029  BP: (!) 143/65 136/64 133/70 (!) 152/67  Pulse: 82 81 84 92  Resp: 18 18 18 20   Temp: 98.8 F (37.1 C) 98.4 F (36.9 C) 97.8 F (36.6 C) 98.4 F (36.9 C)  TempSrc: Oral Oral Oral Oral  SpO2: 99% 97% 98% 96%  Weight:      Height:        Intake/Output Summary (Last 24 hours) at 11/13/2017 2219 Last data filed at 11/13/2017 2037 Gross per 24 hour  Intake 660 ml  Output 2100 ml  Net -1440 ml   Filed Weights   11/09/17 1612  Weight: 59.2 kg (130 lb 8.2 oz)    Physical Exam  Constitutional: afebrile, no CP, no SOB. denies abd pain. CVS: RRR, no rubs, no gallops, no murmur. Pulmonary: no wheezing, no crackles, normal resp effort, good O2 sat on RA. Abdominal: soft, NT, ND, positive BS; G tube and J tube in place. Midline incision w/o signs of infection. Psychiatric: normal mood, no SI or hallucinations.   Data Reviewed: I have personally reviewed following labs and imaging studies  CBC: Recent Labs  Lab 11/09/17 0931 11/10/17 0504 11/11/17 0449 11/12/17 0400 11/13/17 0416  WBC 8.2 5.4 8.8 9.8 12.2*  HGB 7.8* 7.0* 9.7* 10.3* 10.0*  HCT 24.0* 21.1* 29.2* 32.2* 29.9*  MCV 88.9 88.3 86.1 87.0 86.2  PLT 395 292  342 368 381   Basic Metabolic Panel: Recent Labs  Lab 11/09/17 0931 11/10/17 0504 11/11/17 0449 11/12/17 0400 11/13/17 0416  NA 134* 137 136 135 135  K 3.4* 3.8 3.9 3.8 4.1  CL 102 106 108 107 108  CO2 23 21* 18* 17* 15*  GLUCOSE 113* 86 80 88 97  BUN 20 18 12 12 13   CREATININE 1.05 0.92 0.93 0.91 1.03  CALCIUM 8.6* 8.1* 8.2* 8.3* 8.3*  MG  --  2.1  --   --   --    Liver Function Tests: Recent Labs   Lab 11/08/17 1656 11/09/17 0931 11/10/17 0504  AST 23 28 20   ALT 13 16* 14*  ALKPHOS 70 56 49  BILITOT 0.3 0.5 0.8  PROT 6.6 6.6 5.5*  ALBUMIN 4.0 3.2* 2.7*   Recent Labs  Lab 11/08/17 1656 11/09/17 0931  LIPASE 27 26   Urine analysis:    Component Value Date/Time   COLORURINE YELLOW 11/09/2017 [place] 11/09/2017 1202   LABSPEC 1.006 11/09/2017 1202   PHURINE 6.0 11/09/2017 [place] 11/09/2017 1202   HGBUR NEGATIVE 11/09/2017 1202   BILIRUBINUR NEGATIVE 11/09/2017 1202   KETONESUR 5 (A) 11/09/2017 1202   PROTEINUR NEGATIVE 11/09/2017 1202   NITRITE NEGATIVE 11/09/2017 1202   LEUKOCYTESUR NEGATIVE 11/09/2017 1202   Radiology Studies: No results found. Scheduled Meds: . pantoprazole (PROTONIX) IV  40 mg Intravenous Q12H   Continuous Infusions: . sodium chloride 75 mL/hr at 11/13/17 1801     LOS: 3 days    Time spent: 25 minutes    Barton Dubois, MD [facility] Pager [phone]  If 7PM-7AM, please contact night-coverage [email or web address] Password [email or web address] 11/13/2017, 10:19 PM

## 2017-11-13 NOTE — Plan of Care (Signed)
  Progressing Health Behavior/Discharge Planning: Ability to manage health-related needs will improve 11/13/2017 1805 - Progressing by Zadie Rhine, RN Clinical Measurements: Ability to maintain clinical measurements within normal limits will improve 11/13/2017 1805 - Progressing by Zadie Rhine, RN Will remain free from infection 11/13/2017 1805 - Progressing by Zadie Rhine, RN Diagnostic test results will improve 11/13/2017 1805 - Progressing by Zadie Rhine, RN Respiratory complications will improve 11/13/2017 1805 - Progressing by Zadie Rhine, RN Cardiovascular complication will be avoided 11/13/2017 1805 - Progressing by Zadie Rhine, RN Activity: Risk for activity intolerance will decrease 11/13/2017 1805 - Progressing by Zadie Rhine, RN Nutrition: Adequate nutrition will be maintained 11/13/2017 1805 - Progressing by Zadie Rhine, RN Coping: Level of anxiety will decrease 11/13/2017 1805 - Progressing by Zadie Rhine, RN Elimination: Will not experience complications related to bowel motility 11/13/2017 1805 - Progressing by Zadie Rhine, RN Will not experience complications related to urinary retention 11/13/2017 1805 - Progressing by Zadie Rhine, RN Skin Integrity: Risk for impaired skin integrity will decrease 11/13/2017 1805 - Progressing by Zadie Rhine, RN

## 2017-11-13 NOTE — Care Management Note (Signed)
Case Management Note  Patient Details  Name: Burnett Spray MRN: [id number] Date of Birth: August 02, 1934  Subjective/Objective: 83 y/o m admitted w/Partial gastric outlet obstruction. From home. POD#1 lap w/bx-Stage 4 Gastric Adenocarcinoma. GT/JT, f/c. PT cons-await recc.                   Action/Plan:d/c home.   Expected Discharge Date:  11/13/17               Expected Discharge Plan:   [facility]B  In-House Referral:     Discharge planning Services  CM Consult  Post Acute Care Choice:    Choice offered to:     DME Arranged:    DME Agency:     HH Arranged:    [place] Agency:     Status of Service:  In process, will continue to follow  If discussed at Long Length of Stay Meetings, dates discussed:    Additional Comments:  Dessa Phi, RN 11/13/2017, 11:37 AM

## 2017-11-14 DIAGNOSIS — R5381 Other malaise: Secondary | ICD-10-CM

## 2017-11-14 DIAGNOSIS — E43 Unspecified severe protein-calorie malnutrition: Secondary | ICD-10-CM

## 2017-11-14 LAB — GLUCOSE, CAPILLARY: Glucose-Capillary: 103 mg/dL — ABNORMAL HIGH (ref 65–99)

## 2017-11-14 MED ORDER — BOOST / RESOURCE BREEZE PO LIQD
1.0000 | ORAL | Status: DC
  Administered 2017-11-14 – 2017-11-16 (×3): 1 via ORAL

## 2017-11-14 MED ORDER — ENOXAPARIN SODIUM 40 MG/0.4ML ~~LOC~~ SOLN
40.0000 mg | SUBCUTANEOUS | Status: DC
  Administered 2017-11-14 – 2017-11-19 (×6): 40 mg via SUBCUTANEOUS
  Filled 2017-11-14 (×6): qty 0.4

## 2017-11-14 MED ORDER — OSMOLITE 1.5 CAL PO LIQD
1000.0000 mL | ORAL | Status: DC
  Administered 2017-11-14: 1000 mL
  Filled 2017-11-14 (×2): qty 1000

## 2017-11-14 MED ORDER — JEVITY 1.2 CAL PO LIQD: 1000.0000 mL | ORAL | Status: DC

## 2017-11-14 MED ORDER — JEVITY 1.5 CAL/FIBER PO LIQD
1000.0000 mL | ORAL | Status: DC
  Filled 2017-11-14: qty 1000

## 2017-11-14 NOTE — Progress Notes (Signed)
Patient ID: Lee Pratt, male   DOB: 1934/12/30, 83 y.o.   MRN: [id number]    PROGRESS NOTE  Hashem Goynes  ZHG:[id number] DOB: 16-Mar-1934 DOA: 11/09/2017  PCP: [name], MD   Brief Narrative:  Patient is 83 year old male who presented from primary care physician office for further evaluation of significant anemia, hemoglobin 8.5. Patient reports approximately 10 pound weight loss in the past 6 weeks associated with poor oral intake, malaise and fatigue. Patient reports several days duration of progressively worsening nausea and abdominal bloating but denies any vomiting.  In emergency department, hemoglobin 7.8, FOBT negative, patient was orthostatic. CT scan notable for circumferential wall thickening involving the gastric antrum and marked distention of the stomach suggestive of an element of gastric outlet obstruction, recommendation for endoscopy.  Assessment & Plan: Acute blood loss anemia, GI bleed: due to adenocarcinoma of stomach - EGD done, large antrum mass noted, suspected to be malignant, biopsies demonstrated adenocarcinoma. -appreciate assistance and help from CCS; biopsies taken during laparoscopy demonstrating positive carcinomatosis and essentially stage 4 malignant adenocarcinoma. -case discussed with oncology and they have recommended to follow up at cancer center after discharge -will follow CCS post operative rec's - transfused two U PRBC 11/11 and IV iron infusion on 11/13; Hgb has remained stable. -will follow CBC trend intermittently -continue supportive care.  Severe protein calorie malnutrition -dietitian consulted for tube feedings    HTN (hypertension) -stable and well controlled -will monitor and if needed will start IV PRN hydralazine while inpatient. -at discharge if needed will use solution antihypertensive through J tube.  Physical deconditioning -PT has seen patient and recommended SNF -SW consulted  DVT prophylaxis:  SCDs  Code Status:  full code  Family  Communication: pt and family at bedside  Disposition Plan: To be determined; will contact oncology service to discussed with patient and family options of treatment and further needed work up.  Consultants:   GI   Surgery team   Procedures:   EGD 11/10/2017  Diagnostic laparoscopy with biopsy, G tube placement and J tube.  Antimicrobials:   Pre op abx only   Subjective: Afebrile, no CP, no SOB, no abd pain, no nausea, no vomiting. feeling weak.  Objective: Vitals:   11/13/17 2029 11/14/17 0644 11/14/17 1513 11/14/17 1700  BP: (!) 152/67 (!) 143/72 136/73   Pulse: 92 92 (!) 115   Resp: 20 20 19    Temp: 98.4 F (36.9 C) 98.1 F (36.7 C) 98.7 F (37.1 C)   TempSrc: Oral Oral Oral   SpO2: 96% 97% 97%   Weight:    53.5 kg (117 lb 14.4 oz)  Height:        Intake/Output Summary (Last 24 hours) at 11/14/2017 1751 Last data filed at 11/14/2017 1500 Gross per 24 hour  Intake 2700 ml  Output 2750 ml  Net -50 ml   Filed Weights   11/09/17 1612 11/14/17 1700  Weight: 59.2 kg (130 lb 8.2 oz) 53.5 kg (117 lb 14.4 oz)    Physical Exam  Constitutional: no fever, cachetic and frail on appearance. Weak and requiring assistance with getting out of bed and walking. No CP, no SOB. No abd pain. CVS: RRR, no rubs, no gallops.  Pulmonary: no wheezing, no crackles, positive rhonchi. Good O2 sat on RA Abdominal: Soft, NT, ND, positive BS; G tube and J tube in place. Midline incision w/o signs of infection and healing properly. Patient's G tube with just biliary fluid drainage. Psychiatric: normal mood, no SI, no hallucinations.  Data Reviewed: I have personally reviewed following labs and imaging studies  CBC: Recent Labs  Lab 11/09/17 0931 11/10/17 0504 11/11/17 0449 11/12/17 0400 11/13/17 0416  WBC 8.2 5.4 8.8 9.8 12.2*  HGB 7.8* 7.0* 9.7* 10.3* 10.0*  HCT 24.0* 21.1* 29.2* 32.2* 29.9*  MCV 88.9 88.3 86.1 87.0 86.2  PLT 395 292 342 368 115   Basic Metabolic  Panel: Recent Labs  Lab 11/09/17 0931 11/10/17 0504 11/11/17 0449 11/12/17 0400 11/13/17 0416  NA 134* 137 136 135 135  K 3.4* 3.8 3.9 3.8 4.1  CL 102 106 108 107 108  CO2 23 21* 18* 17* 15*  GLUCOSE 113* 86 80 88 97  BUN 20 18 12 12 13   CREATININE 1.05 0.92 0.93 0.91 1.03  CALCIUM 8.6* 8.1* 8.2* 8.3* 8.3*  MG  --  2.1  --   --   --    Liver Function Tests: Recent Labs  Lab 11/08/17 1656 11/09/17 0931 11/10/17 0504  AST 23 28 20   ALT 13 16* 14*  ALKPHOS 70 56 49  BILITOT 0.3 0.5 0.8  PROT 6.6 6.6 5.5*  ALBUMIN 4.0 3.2* 2.7*   Recent Labs  Lab 11/08/17 1656 11/09/17 0931  LIPASE 27 26   Urine analysis:    Component Value Date/Time   COLORURINE YELLOW 11/09/2017 [place] 11/09/2017 1202   LABSPEC 1.006 11/09/2017 1202   PHURINE 6.0 11/09/2017 [place] 11/09/2017 1202   HGBUR NEGATIVE 11/09/2017 1202   BILIRUBINUR NEGATIVE 11/09/2017 1202   KETONESUR 5 (A) 11/09/2017 1202   PROTEINUR NEGATIVE 11/09/2017 1202   NITRITE NEGATIVE 11/09/2017 1202   LEUKOCYTESUR NEGATIVE 11/09/2017 1202   Radiology Studies: No results found. Scheduled Meds: . enoxaparin (LOVENOX) injection  40 mg Subcutaneous Q24H  . feeding supplement  1 Container Oral Q24H  . feeding supplement (OSMOLITE 1.5 CAL)  1,000 mL Per Tube Q24H  . pantoprazole (PROTONIX) IV  40 mg Intravenous Q12H   Continuous Infusions: . sodium chloride 75 mL/hr at 11/13/17 1801     LOS: 4 days    Time spent: 25 minutes    Barton Dubois, MD [facility] Pager [phone]  If 7PM-7AM, please contact night-coverage [email or web address] Password [email or web address] 11/14/2017, 5:51 PM

## 2017-11-14 NOTE — Care Management Note (Signed)
Case Management Note  Patient Details  Name: Lee Pratt MRN: [id number] Date of Birth: May 16, 1934  Subjective/Objective:   Spoke to patient/son Lee Pratt in rm about d/c plans-provided w/HHC agency list-son explained that only patient & spouse is @ home(they are elderly), son lives in [place]. Explained that if patient Lee Pratt d/c home w/TPN there must be an available primary caregiver to teach for TPN. Provided w/private duty care agency list. Son voiced understanding. He Lee Pratt discuss this info with his other brother who is also out of state.  Await choice for [facility] agency.                Action/Plan:d/c plan home w/HHC.   Expected Discharge Date:  11/13/17               Expected Discharge Plan:  [place]  In-House Referral:  Clinical Social Work  Discharge planning Services  CM Consult  Post Acute Care Choice:    Choice offered to:  Adult Children  DME Arranged:    DME Agency:     HH Arranged:  RN, PT, OT, Nurse's Aide, Social Work [employer] Agency:     Status of Service:  In process, Lee Pratt continue to follow  If discussed at Long Length of Stay Meetings, dates discussed:    Additional Comments:  Dessa Phi, RN 11/14/2017, 4:18 PM

## 2017-11-14 NOTE — Progress Notes (Signed)
[facility] Progress Note  2 Days Post-Op  Subjective: CC:  Having mild abdominal soreness that is worse with sitting up/coughing. Denies nausea, vomiting, belching. Family is at bedside (wife and son). Patient aware of cancer diagnosis and plans to talk to oncology today.   Objective: Vital signs in last 24 hours: Temp:  [97.8 F (36.6 C)-98.4 F (36.9 C)] 98.1 F (36.7 C) (11/15 0644) Pulse Rate:  [84-92] 92 (11/15 0644) Resp:  [18-20] 20 (11/15 0644) BP: (133-152)/(67-72) 143/72 (11/15 0644) SpO2:  [96 %-98 %] 97 % (11/15 0644) Last BM Date: 11/11/17  Intake/Output from previous day: 11/14 0701 - 11/15 0700 In: 1860 [I.V.:1800] Out: 2000 [Urine:1350; Drains:650] Intake/Output this shift: No intake/output data recorded.  PE: Gen:  Alert, NAD, pleasant Pulm:  Normal effort   Abd: Soft, non-tender, gastrostomy tube in place, draining bilious fluid to gravity. J-tube capped. Midline incision with honeycomb in place Skin: warm and dry, no rashes  Psych: A&Ox3   Lab Results:  Recent Labs    11/12/17 0400 11/13/17 0416  WBC 9.8 12.2*  HGB 10.3* 10.0*  HCT 32.2* 29.9*  PLT 368 358   BMET Recent Labs    11/12/17 0400 11/13/17 0416  NA 135 135  K 3.8 4.1  CL 107 108  CO2 17* 15*  GLUCOSE 88 97  BUN 12 13  CREATININE 0.91 1.03  CALCIUM 8.3* 8.3*   PT/INR No results for input(s): LABPROT, INR in the last 72 hours. CMP     Component Value Date/Time   NA 135 11/13/2017 0416   NA 134 11/08/2017 1656   K 4.1 11/13/2017 0416   CL 108 11/13/2017 0416   CO2 15 (L) 11/13/2017 0416   GLUCOSE 97 11/13/2017 0416   BUN 13 11/13/2017 0416   BUN 22 11/08/2017 1656   CREATININE 1.03 11/13/2017 0416   CREATININE 0.99 07/20/2013 0941   CALCIUM 8.3 (L) 11/13/2017 0416   PROT 5.5 (L) 11/10/2017 0504   PROT 6.6 11/08/2017 1656   ALBUMIN 2.7 (L) 11/10/2017 0504   ALBUMIN 4.0 11/08/2017 1656   AST 20 11/10/2017 0504   ALT 14 (L) 11/10/2017 0504   ALKPHOS  49 11/10/2017 0504   BILITOT 0.8 11/10/2017 0504   BILITOT 0.3 11/08/2017 1656   GFRNONAA >60 11/13/2017 0416   GFRAA >60 11/13/2017 0416   Lipase     Component Value Date/Time   LIPASE 26 11/09/2017 0931   Studies/Results: No results found.  Anti-infectives: Anti-infectives (From admission, onward)   Start     Dose/Rate Route Frequency Ordered Stop   11/12/17 1230  ceFAZolin (ANCEF) IVPB 2g/100 mL premix     2 g 200 mL/hr over 30 Minutes Intravenous On call to O.R. 11/11/17 1055 11/12/17 1026   11/12/17 0913  ceFAZolin (ANCEF) 2-4 GM/100ML-% IVPB    Comments:  Bridget Hartshorn   : cabinet override      11/12/17 0913 11/12/17 1026   11/11/17 1130  ceFAZolin (ANCEF) IVPB 2g/100 mL premix  Status:  Discontinued     2 g 200 mL/hr over 30 Minutes Intravenous On call to O.R. 11/11/17 1045 11/11/17 1056     Assessment/Plan GOO 2/2 stage 4 gastric adenocarcinoma  S/P Diagnostic Laparoscopy with biopsy, gastojejunostomy, insertion of G-tube and J-tube - intraoperatively peritoneal nodules were removed for frozen biopsy and tested positive for carcinomatosis.  - continue G-tube to gravity - Recommend oncology consult to direct further workup and management - pain control, mobilize, IS   FEN: clears  for comfort, Will start trickle tube feeds today ID: perioperative Ancef 11/13  VTE: SCD's, Ok to resume chemical VTE . Foley: d/c today    LOS: 4 days    Rosario Adie, MD  [facility]

## 2017-11-14 NOTE — Progress Notes (Signed)
Physical Therapy Treatment Patient Details Name: Lee Pratt MRN: [id number] DOB: 1934/04/13 Today's Date: 11/14/2017    History of Present Illness 83 yo male admitted with gastric outlet obstruction. Found to have gastric adenocarcinoma. S/P gastrojejunostomy, G tube placement, J tube placement 11/12/17    PT Comments    Progressing with mobility. Bed mobility remains an issue. Pt is still unable to get in/out of bed on his own, even with HOB elevated. Wife has practiced assisting pt but she is physically unable. Discussed d/c plan with family again on today. They would like to speak with a CSW to see if there is an option for ALF or SNF. . If ALF or SNF is not an option, will need to set pt/family up with as much care as we can arrange-HHPT, HHOPT, home health aide. Will continue to follow and progress activity as able   Follow Up Recommendations  SNF     Equipment Recommendations  None recommended by PT    Recommendations for Other Services OT consult     Precautions / Restrictions Precautions Precautions: Fall Precaution Comments: G tube, J tube Restrictions Weight Bearing Restrictions: No    Mobility  Bed Mobility Overal bed mobility: Needs Assistance Bed Mobility: Supine to Sit     Supine to sit: HOB elevated;Min assist     General bed mobility comments: Pt still unable to get out of bed unassisted even with HOB elevated. Min assist for trunk to upright. Once upright, pt can scoot to EOB.   Transfers Overall transfer level: Needs assistance Equipment used: Rolling walker (2 wheeled) Transfers: Sit to/from Stand Sit to Stand: Min guard;From elevated surface         General transfer comment: close guard for safety. VCs and cueing for safety, hand placement.   Ambulation/Gait Ambulation/Gait assistance: Min assist Ambulation Distance (Feet): 500 Feet Assistive device: Rolling walker (2 wheeled) Gait Pattern/deviations: Step-through pattern;Decreased stride  length     General Gait Details: Intermittent assist to steady during turns and when pt scans environment. He tolerated increased distance well.    Stairs            Wheelchair Mobility    Modified Rankin (Stroke Patients Only)       Balance Overall balance assessment: Needs assistance           Standing balance-Leahy Scale: Poor Standing balance comment: requiring RW currently                            Cognition Arousal/Alertness: Awake/alert Behavior During Therapy: WFL for tasks assessed/performed Overall Cognitive Status: Difficult to assess                                 General Comments: some communication difficulties due hearing loss and first language being [place]. Seems appropriate      Exercises      General Comments        Pertinent Vitals/Pain Pain Assessment: No/denies pain    Home Living                      Prior Function            PT Goals (current goals can now be found in the care plan section) Progress towards PT goals: Progressing toward goals    Frequency    Min 3X/week      PT  Plan Discharge plan needs to be updated    Co-evaluation              AM-PAC PT "6 Clicks" Daily Activity  Outcome Measure  Difficulty turning over in bed (including adjusting bedclothes, sheets and blankets)?: Unable Difficulty moving from lying on back to sitting on the side of the bed? : Unable Difficulty sitting down on and standing up from a chair with arms (e.g., wheelchair, bedside commode, etc,.)?: Unable Help needed moving to and from a bed to chair (including a wheelchair)?: A Little Help needed walking in hospital room?: A Little Help needed climbing 3-5 steps with a railing? : A Little 6 Click Score: 12    End of Session   Activity Tolerance: Patient limited by fatigue Patient left: in chair;with call bell/phone within reach;with family/visitor present   PT Visit Diagnosis: Muscle  weakness (generalized) (M62.81);Difficulty in walking, not elsewhere classified (R26.2)     Time: 4818-5909 PT Time Calculation (min) (ACUTE ONLY): 23 min  Charges:  $Gait Training: 8-22 mins $Therapeutic Activity: 8-22 mins                    G Codes:          Weston Anna, MPT Pager: [phone]

## 2017-11-14 NOTE — Progress Notes (Signed)
Nutrition Follow-up  DOCUMENTATION CODES:   Non-severe (moderate) malnutrition in context of acute illness/injury  INTERVENTION:  - Will order Osmolite 1.5 @ 10 mL/hr.  - Goal rate for TF: Osmolite 1.5 @ 55 mL/hr which provides 1980 kcal, 83 grams of protein (92% minimum estimated protein need), and 1006 mL free water.  - Will order Boost Breeze once/day, this supplement provides 250 kcal and 9 grams of protein (can ;be used when providing PO medications). - Will assess for free water needs as TF rate is able to be advanced.   NUTRITION DIAGNOSIS:   Moderate Malnutrition related to acute illness(gastric outlet obstruction) as evidenced by energy intake < or equal to 50% for > or equal to 1 month, moderate fat depletion, severe fat depletion, severe muscle depletion. -ongoing  GOAL:   Patient will meet greater than or equal to 90% of their needs -unmet at this time.   MONITOR:   TF tolerance, PO intake, Diet advancement, Weight trends, Labs, Skin  REASON FOR ASSESSMENT:   Consult Enteral/tube feeding initiation and management  ASSESSMENT:   Pt with PMH significant for HTN. Presents this admission with complaints of decreased appetite and abdominal distention. CT scan showed at partial gastric outlet obstruction. Admitted for anemia secondary to GI blood loss.   11/15 Per notes, biopsies taken during EGD showed gastric adenocarcinoma. On 11/13 pt underwent laparoscopy with biopsy, gastrojejunostomy with insertion of G-tube and J-tube. Consult for TF received this AM. Pt on CLD from 11/11-11/13 and then NPO from 11/13 through this AM and advanced back to CLD about 1.5 hours ago. Dr. Marcello Moores' note from this AM states CLD for comfort feeds. She would like to do trickle feeds only today via J-tube. G-tube to gravity and to remain as such. Current order: Jevity 1.5 @ 10 mL/hr which provides 360 kcal, 15 grams of protein, and 182 mL free water. Will change to Osmolite 1.5, which is a  fiber-free formula, @ 10 mL/hr which will provide to same nutritional value.   He is having slight abdominal pain this AM which is worse when sitting or applying pressure in any way. He has not experienced N/V. No new weight since 11/10; will order for new weight today.  Medications reviewed; 40 mg IV Protonix BID.  Labs reviewed; Ca: 8.3 mg/dL. IVF: NS @ 75 mL/hr.      11/12 11/11- Pt had EGD showing large antrum mass, apparently malignant. Biopsied.   - Spoke with pt at bedside.  - Poor appetite for 6 weeks related to abdominal pain and gas.  - Within the last three weeks, abdominal distention became increasingly worse resulting in pt consuming a liquid diet (mostly soup).  - Tolerating clears at this time. Pt amendable to supplements this hospital stay.  - Pt reports losing 10 lb within a 6 week time frame showing a 7% weight loss in 1.5 months.   - Records limited in weight history for 2018.  - Nutrition-Focused physical exam completed.  - Suspect some depletions are from advanced age along with poor intake.      Diet Order:  Diet clear liquid Room service appropriate? Yes; Fluid consistency: Thin  EDUCATION NEEDS:   Education needs have been addressed  Skin:  Skin Assessment: Skin Integrity Issues: Skin Integrity Issues:: Incisions Incisions: Abdominal  Last BM:  11/12  Height:   Ht Readings from Last 1 Encounters:  11/09/17 5\' 9"  (1.753 m)    Weight:   Wt Readings from Last 1 Encounters:  11/09/17  130 lb 8.2 oz (59.2 kg)    Ideal Body Weight:  72.7 kg  BMI:  Body mass index is 19.27 kg/m.  Estimated Nutritional Needs:   Kcal:  1800-2000 kcal/day  Protein:  90-100 g/day  Fluid:  >1.8 L/day      Jarome Matin, MS, RD, LDN, [facility] Inpatient Clinical Dietitian Pager # [phone] After hours/weekend pager # [phone]

## 2017-11-14 NOTE — Care Management Important Message (Signed)
Important Message  Patient Details  Name: Lee Pratt MRN: [id number] Date of Birth: [birth year]   Medicare Important Message Given:  Yes    Kerin Salen 11/14/2017, 9:39 AMImportant Message  Patient Details  Name: Lee Pratt MRN: [id number] Date of Birth: 1934/03/17   Medicare Important Message Given:  Yes    Kerin Salen 11/14/2017, 9:39 AM

## 2017-11-14 NOTE — Care Management Note (Signed)
Case Management Note  Patient Details  Name: Lee Pratt MRN: [id number] Date of Birth: July 24, 1934  Subjective/Objective:Noted PT new recc-SNF. CSW following-await recc.                    Action/Plan:d/c plan home w/HHC.   Expected Discharge Date:  11/13/17               Expected Discharge Plan:  Skilled Nursing Facility  In-House Referral:  Clinical Social Work  Discharge planning Services  CM Consult  Post Acute Care Choice:    Choice offered to:  Adult Children  DME Arranged:    DME Agency:     HH Arranged:    HH Agency:     Status of Service:  In process, will continue to follow  If discussed at Long Length of Stay Meetings, dates discussed:    Additional Comments:  Dessa Phi, RN 11/14/2017, 12:27 PM

## 2017-11-14 NOTE — Clinical Social Work Note (Signed)
Clinical Social Work Assessment  Patient Details  Name: Lee Pratt MRN: [id number] Date of Birth: [birth year]  Date of referral:  11/14/17               Reason for consult:  Discharge Planning                Permission sought to share information with:  Family Supports Permission granted to share information::  Yes, Verbal Permission Granted  Name::     son Will, wife Aie  Agency::     Relationship::     Contact Information:     Housing/Transportation Living arrangements for the past 2 months:  Single Family Home Source of Information:  Patient, Adult Children, Spouse Patient Interpreter Needed:  None Criminal Activity/Legal Involvement Pertinent to Current Situation/Hospitalization:  No - Comment as needed Significant Relationships:  Friend, Spouse, Adult Children, Neighbor Lives with:  Spouse Do you feel safe going back to the place where you live?  Yes Need for family participation in patient care:  No (Coment)  Care giving concerns:  Pt from home where he resides with his wife. Son is in town to assist them currently due to this hospitalization but does not plan to be here long-term. Pt independent at baseline. Has G tube/J tube.   Social Worker assessment / plan:  CSW consulted to assess possible SNF placement. Met with pt and his family at bedside. Explained that pt has worked with therapy in house and first evaluation warranted recommendation for home health PT, which they are open to, however son explains they requested SNF placement due to wife being able to provide minimal support at home.  CSW reviewed therapy notes as well, pt ambulating 500 ft min assist. CSW explained that pt will not qualify for Medicare coverage for SNF due to request being for supervision but that private pay facility placement can be investigated.  Son states he is applying for Medicaid for pt but that currently there is no payor source for long term care. Also explains that they do not feel long term  care is needed, describing desire for respite care when pt DCs from hospital. Discussed care needs at length with family. Family agrees that home health services and looking into hiring private duty aides is their desired plan. Asks if tube feeds can be delivered to home to decrease pt/wife's need to drive.  Plan: return home at [facility] home health services.   Employment status:  Retired Forensic scientist:  Medicare PT Recommendations:  [place] / Referral to community resources:     Patient/Family's Response to care:  Appreciative of care  Patient/Family's Understanding of and Emotional Response to Diagnosis, Current Treatment, and Prognosis:  Pt demonstrates understanding of current treatment, plan, and potential barriers. Son and pt seem emotionally somewhat overwhelmed, but also adaptable and positive.  Emotional Assessment Appearance:  Appears stated age Attitude/Demeanor/Rapport:  (pleasant) Affect (typically observed):  Accepting, Adaptable Orientation:  Oriented to Self, Oriented to Place, Oriented to  Time, Oriented to Situation Alcohol / Substance use:  Not Applicable Psych involvement (Current and /or in the community):  No (Comment)  Discharge Needs  Concerns to be addressed:  Discharge Planning Concerns Readmission within the last 30 days:  No Current discharge risk:  None Barriers to Discharge:  Continued Medical Work up   [employer], LCSW 11/14/2017, 4:06 PM  [phone]

## 2017-11-15 LAB — GLUCOSE, CAPILLARY
GLUCOSE-CAPILLARY: 122 mg/dL — AB (ref 65–99)
GLUCOSE-CAPILLARY: 136 mg/dL — AB (ref 65–99)
GLUCOSE-CAPILLARY: 138 mg/dL — AB (ref 65–99)
Glucose-Capillary: 108 mg/dL — ABNORMAL HIGH (ref 65–99)
Glucose-Capillary: 130 mg/dL — ABNORMAL HIGH (ref 65–99)
Glucose-Capillary: 134 mg/dL — ABNORMAL HIGH (ref 65–99)
Glucose-Capillary: 141 mg/dL — ABNORMAL HIGH (ref 65–99)

## 2017-11-15 LAB — BASIC METABOLIC PANEL
ANION GAP: 7 (ref 5–15)
BUN: 14 mg/dL (ref 6–20)
CALCIUM: 8.1 mg/dL — AB (ref 8.9–10.3)
CO2: 20 mmol/L — AB (ref 22–32)
Chloride: 109 mmol/L (ref 101–111)
Creatinine, Ser: 0.83 mg/dL (ref 0.61–1.24)
Glucose, Bld: 120 mg/dL — ABNORMAL HIGH (ref 65–99)
Potassium: 3.3 mmol/L — ABNORMAL LOW (ref 3.5–5.1)
Sodium: 136 mmol/L (ref 135–145)

## 2017-11-15 LAB — CBC
HEMATOCRIT: 30.6 % — AB (ref 39.0–52.0)
Hemoglobin: 10.1 g/dL — ABNORMAL LOW (ref 13.0–17.0)
MCH: 28.4 pg (ref 26.0–34.0)
MCHC: 33 g/dL (ref 30.0–36.0)
MCV: 86 fL (ref 78.0–100.0)
PLATELETS: 357 10*3/uL (ref 150–400)
RBC: 3.56 MIL/uL — ABNORMAL LOW (ref 4.22–5.81)
RDW: 15.2 % (ref 11.5–15.5)
WBC: 9.9 10*3/uL (ref 4.0–10.5)

## 2017-11-15 MED ORDER — OXYCODONE HCL 5 MG/5ML PO SOLN: 5.0000 mg | ORAL | Status: DC | PRN

## 2017-11-15 MED ORDER — POTASSIUM CHLORIDE 20 MEQ/15ML (10%) PO SOLN
40.0000 meq | Freq: Once | ORAL | Status: AC
  Administered 2017-11-15: 40 meq via ORAL
  Filled 2017-11-15: qty 30

## 2017-11-15 MED ORDER — SODIUM CHLORIDE 0.9 % IV SOLN
510.0000 mg | Freq: Once | INTRAVENOUS | Status: AC
  Administered 2017-11-15: 510 mg via INTRAVENOUS
  Filled 2017-11-15: qty 17

## 2017-11-15 MED ORDER — OSMOLITE 1.5 CAL PO LIQD
1000.0000 mL | ORAL | Status: AC
  Administered 2017-11-15 – 2017-11-17 (×3): 1000 mL
  Filled 2017-11-15 (×5): qty 1000

## 2017-11-15 NOTE — Progress Notes (Signed)
Patient ID: Lee Pratt, male   DOB: 06-17-1934, 83 y.o.   MRN: [id number]    PROGRESS NOTE  Lee Pratt  ASN:[id number] DOB: 07-02-1934 DOA: 11/09/2017  PCP: Lee Gravel, MD   Brief Narrative:  Patient is 83 year old male who presented from primary care physician office for further evaluation of significant anemia, hemoglobin 8.5. Patient reports approximately 10 pound weight loss in the past 6 weeks associated with poor oral intake, malaise and fatigue. Patient reports several days duration of progressively worsening nausea and abdominal bloating but denies any vomiting.  In emergency department, hemoglobin 7.8, FOBT negative, patient was orthostatic. CT scan notable for circumferential wall thickening involving the gastric antrum and marked distention of the stomach suggestive of an element of gastric outlet obstruction, recommendation for endoscopy.  Assessment & Plan: Acute blood loss anemia, GI bleed: due to adenocarcinoma of stomach - EGD done, large antrum mass noted, suspected to be malignant, biopsies demonstrated adenocarcinoma. -appreciate assistance and help from CCS; biopsies taken during laparoscopy demonstrating positive carcinomatosis and essentially stage 4 malignant adenocarcinoma. -case discussed with oncology and they have recommended to follow up at cancer center after discharge -will follow CCS post operative rec's - transfused two U PRBC 11/11 and IV iron infusion on 11/13; Hgb has remained stable.  -today will finalize iron infusion -will follow CBC trend intermittently -continue supportive care.  Severe protein calorie malnutrition/hypokalemia -dietitian consulted for tube feedings   -advancing towards goal; so far no nausea, no vomiting and no significant problems. -will replete electrolytes  HTN (hypertension) -has remained stable and well controlled so far -will monitor and if needed will start IV PRN hydralazine while inpatient. -at discharge if needed will use  solution antihypertensive through J tube.  Physical deconditioning -PT has seen patient and recommended SNF -SW consulted; not a candidate for SNF (giving mobility above 150 feet and just minimal assistance) -social support at home is limited, given elderly wife (who had ongoing medical problems as well) -will maximize home health service and family would hired private duty caregivers  DVT prophylaxis:  SCDs  Code Status:  full code  Family Communication: pt and family at bedside  Disposition Plan: To be determined; will contact oncology service to discussed with patient and family options of treatment and further needed work up.  Consultants:   GI   Surgery team   Procedures:   EGD 11/10/2017  Diagnostic laparoscopy with biopsy, G tube placement and J tube.  Antimicrobials:   Pre op abx only   Subjective: No fever, no CP, no SOB, no nausea, no vomiting.   Objective: Vitals:   11/14/17 1513 11/14/17 1700 11/14/17 2009 11/15/17 0524  BP: 136/73  (!) 147/80 138/67  Pulse: (!) 115  (!) 103 85  Resp: 19  16 17   Temp: 98.7 F (37.1 C)  98.8 F (37.1 C) 97.6 F (36.4 C)  TempSrc: Oral  Oral Oral  SpO2: 97%  97% 98%  Weight:  53.5 kg (117 lb 14.4 oz)  59 kg (130 lb)  Height:        Intake/Output Summary (Last 24 hours) at 11/15/2017 1812 Last data filed at 11/15/2017 1400 Gross per 24 hour  Intake 1387.01 ml  Output 1200 ml  Net 187.01 ml   Filed Weights   11/09/17 1612 11/14/17 1700 11/15/17 0524  Weight: 59.2 kg (130 lb 8.2 oz) 53.5 kg (117 lb 14.4 oz) 59 kg (130 lb)    Physical Exam  Constitutional: afebrile, no CP, no SOB, no nausea,  no vomiting. Patient reporting some mild pain around incision in his abdomen when coughing. Tolerating initiation of tube feedings. Chronically ill, underweight and frail in appearance. CVS: RRR, no rubs, no gallops.  Pulmonary: good air movement, no wheezing, no crackles. Positive scattered rhonchi appreciated.  Abdominal:  soft, ND, positive BS, no guarding, midline incision w/o signs of superimposed infection. G tube and J tube in place. G tube continue showing just biliary fluid drainage. Psychiatric: normal mood, no SI, no hallucinations.    Data Reviewed: I have personally reviewed following labs and imaging studies  CBC: Recent Labs  Lab 11/10/17 0504 11/11/17 0449 11/12/17 0400 11/13/17 0416 11/15/17 0423  WBC 5.4 8.8 9.8 12.2* 9.9  HGB 7.0* 9.7* 10.3* 10.0* 10.1*  HCT 21.1* 29.2* 32.2* 29.9* 30.6*  MCV 88.3 86.1 87.0 86.2 86.0  PLT 292 342 368 358 970   Basic Metabolic Panel: Recent Labs  Lab 11/10/17 0504 11/11/17 0449 11/12/17 0400 11/13/17 0416 11/15/17 0423  NA 137 136 135 135 136  K 3.8 3.9 3.8 4.1 3.3*  CL 106 108 107 108 109  CO2 21* 18* 17* 15* 20*  GLUCOSE 86 80 88 97 120*  BUN 18 12 12 13 14   CREATININE 0.92 0.93 0.91 1.03 0.83  CALCIUM 8.1* 8.2* 8.3* 8.3* 8.1*  MG 2.1  --   --   --   --    Liver Function Tests: Recent Labs  Lab 11/09/17 0931 11/10/17 0504  AST 28 20  ALT 16* 14*  ALKPHOS 56 49  BILITOT 0.5 0.8  PROT 6.6 5.5*  ALBUMIN 3.2* 2.7*   Recent Labs  Lab 11/09/17 0931  LIPASE 26   Urine analysis:    Component Value Date/Time   COLORURINE YELLOW 11/09/2017 [place] 11/09/2017 1202   LABSPEC 1.006 11/09/2017 1202   PHURINE 6.0 11/09/2017 [place] 11/09/2017 [place] 11/09/2017 [place] 11/09/2017 1202   KETONESUR 5 (A) 11/09/2017 1202   PROTEINUR NEGATIVE 11/09/2017 1202   NITRITE NEGATIVE 11/09/2017 [place] 11/09/2017 1202   Radiology Studies: No results found. Scheduled Meds: . enoxaparin (LOVENOX) injection  40 mg Subcutaneous Q24H  . feeding supplement  1 Container Oral Q24H  . feeding supplement (OSMOLITE 1.5 CAL)  1,000 mL Per Tube Q24H  . pantoprazole (PROTONIX) IV  40 mg Intravenous Q12H   Continuous Infusions: . sodium chloride 50 mL/hr at  11/15/17 1514     LOS: 5 days    Time spent: 25 minutes    Barton Dubois, MD [facility] Pager [phone]  If 7PM-7AM, please contact night-coverage [email or web address] Password [facility] 11/15/2017, 6:12 PM

## 2017-11-15 NOTE — Progress Notes (Addendum)
[facility] Progress Note  3 Days Post-Op  Subjective: CC:  Having abdominal soreness that is worse with sitting up/coughing. Denies nausea, vomiting, belching. Family is at bedside (wife).  Objective: Vital signs in last 24 hours: Temp:  [97.6 F (36.4 C)-98.8 F (37.1 C)] 97.6 F (36.4 C) (11/16 0524) Pulse Rate:  [85-115] 85 (11/16 0524) Resp:  [16-19] 17 (11/16 0524) BP: (136-147)/(67-80) 138/67 (11/16 0524) SpO2:  [97 %-98 %] 98 % (11/16 0524) Weight:  [53.5 kg (117 lb 14.4 oz)-59 kg (130 lb)] 59 kg (130 lb) (11/16 0524) Last BM Date: 11/11/17  Intake/Output from previous day: 11/15 0701 - 11/16 0700 In: 1681.3 [P.O.:240; I.V.:1306.7; NG/GT:134.7] Out: 1550 [Drains:1550] Intake/Output this shift: No intake/output data recorded.  PE: Gen:  Alert, NAD, pleasant Pulm:  Normal effort   Abd: Soft, non-tender, gastrostomy tube in place, draining bilious fluid to gravity. J-tube with TF's at 68ml/h. Midline incision with honeycomb in place, no signs of infection Skin: warm and dry, no rashes  Psych: A&Ox3   Lab Results:  Recent Labs    11/13/17 0416 11/15/17 0423  WBC 12.2* 9.9  HGB 10.0* 10.1*  HCT 29.9* 30.6*  PLT 358 357   BMET Recent Labs    11/13/17 0416 11/15/17 0423  NA 135 136  K 4.1 3.3*  CL 108 109  CO2 15* 20*  GLUCOSE 97 120*  BUN 13 14  CREATININE 1.03 0.83  CALCIUM 8.3* 8.1*   PT/INR No results for input(s): LABPROT, INR in the last 72 hours. CMP     Component Value Date/Time   NA 136 11/15/2017 0423   NA 134 11/08/2017 1656   K 3.3 (L) 11/15/2017 0423   CL 109 11/15/2017 0423   CO2 20 (L) 11/15/2017 0423   GLUCOSE 120 (H) 11/15/2017 0423   BUN 14 11/15/2017 0423   BUN 22 11/08/2017 1656   CREATININE 0.83 11/15/2017 0423   CREATININE 0.99 07/20/2013 0941   CALCIUM 8.1 (L) 11/15/2017 0423   PROT 5.5 (L) 11/10/2017 0504   PROT 6.6 11/08/2017 1656   ALBUMIN 2.7 (L) 11/10/2017 0504   ALBUMIN 4.0 11/08/2017 1656   AST  20 11/10/2017 0504   ALT 14 (L) 11/10/2017 0504   ALKPHOS 49 11/10/2017 0504   BILITOT 0.8 11/10/2017 0504   BILITOT 0.3 11/08/2017 1656   GFRNONAA >60 11/15/2017 0423   GFRAA >60 11/15/2017 0423   Lipase     Component Value Date/Time   LIPASE 26 11/09/2017 0931   Studies/Results: No results found.  Anti-infectives: Anti-infectives (From admission, onward)   Start     Dose/Rate Route Frequency Ordered Stop   11/12/17 1230  ceFAZolin (ANCEF) IVPB 2g/100 mL premix     2 g 200 mL/hr over 30 Minutes Intravenous On call to O.R. 11/11/17 1055 11/12/17 1026   11/12/17 0913  ceFAZolin (ANCEF) 2-4 GM/100ML-% IVPB    Comments:  Bridget Hartshorn   : cabinet override      11/12/17 0913 11/12/17 1026   11/11/17 1130  ceFAZolin (ANCEF) IVPB 2g/100 mL premix  Status:  Discontinued     2 g 200 mL/hr over 30 Minutes Intravenous On call to O.R. 11/11/17 1045 11/11/17 1056     Assessment/Plan GOO 2/2 stage 4 gastric adenocarcinoma  S/P Diagnostic Laparoscopy with biopsy, gastojejunostomy, insertion of G-tube and J-tube - intraoperatively peritoneal nodules were removed for frozen biopsy and tested positive for carcinomatosis.  - continue G-tube to gravity - Recommend oncology consult to direct further workup  and management - pain control, mobilize, IS   FEN: clears for comfort, Will advance tube feeds today ID: perioperative Ancef 11/13  VTE: SCD's, Ok to resume chemical VTE . Foley: d/c'd 11/15    LOS: 5 days    Rosario Adie, MD  [facility]

## 2017-11-15 NOTE — Care Management Note (Signed)
Case Management Note  Patient Details  Name: Lee Pratt MRN: [id number] Date of Birth: 1934/04/09  Subjective/Objective:   Spoke to patient/son again about d/cplans.-they also want a hospital bed for [facility] rep Santiago Glad aware & following for HHC-HHR/PT/OT/aide/sw/enteral feeding-awaiting specific formula orders. Hospital bed to be ordered by attending-notified.Ambulance transp  PTAR needed @ d/c.                 Action/Plan:d/c home w/HHC/dme   Expected Discharge Date:  11/13/17               Expected Discharge Plan:  [place]  In-House Referral:  Clinical Social Work  Discharge planning Services  CM Consult  Post Acute Care Choice:    Choice offered to:  Adult Children  DME Arranged:  Hospital bed DME Agency:  [place] Arranged:  RN, PT, OT, Nurse's Aide, Social Work [employer] Agency:     Status of Service:  In process, will continue to follow  If discussed at Long Length of Stay Meetings, dates discussed:    Additional Comments:  Dessa Phi, RN 11/15/2017, 3:04 PM

## 2017-11-15 NOTE — Progress Notes (Signed)
Physical Therapy Treatment Patient Details Name: Lee Pratt MRN: [id number] DOB: Sep 27, 1934 Today's Date: 11/15/2017    History of Present Illness 83 yo male admitted with gastric outlet obstruction. Found to have gastric adenocarcinoma. S/P gastrojejunostomy, G tube placement, J tube placement 11/12/17    PT Comments    Pt ambulated in hallway and requires min assist for any challenges to balance (see mobility section below).  Pt and family have been discussing d/c plan with CSW and case Freight forwarder.  If ALF or SNF is not an option, will need to set pt/family up with as much care as we can arrange-HHPT, HHOPT, home health aide.  Pt would also benefit from hospital bed.   Follow Up Recommendations  SNF     Equipment Recommendations  Hospital bed    Recommendations for Other Services       Precautions / Restrictions Precautions Precautions: Fall Precaution Comments: G tube, J tube    Mobility  Bed Mobility Overal bed mobility: Needs Assistance Bed Mobility: Supine to Sit     Supine to sit: HOB elevated;Min guard     General bed mobility comments: increased time and effort, cues for technique, pt able to sit EOB however when tying gown in front had trunk LOB posteriorly requiring min assist  Transfers Overall transfer level: Needs assistance Equipment used: Rolling walker (2 wheeled) Transfers: Sit to/from Stand Sit to Stand: Min guard;From elevated surface         General transfer comment: close guard for safety. VCs and cueing for safety, hand placement.   Ambulation/Gait Ambulation/Gait assistance: Min assist Ambulation Distance (Feet): 420 Feet Assistive device: Rolling walker (2 wheeled) Gait Pattern/deviations: Step-through pattern;Decreased stride length     General Gait Details: min assist for steadying with pt removing hand from RW (wipe face, adjust gown, etc) otherwise with Bil UE support, min/guard for safety   Stairs            Wheelchair  Mobility    Modified Rankin (Stroke Patients Only)       Balance                                            Cognition Arousal/Alertness: Awake/alert Behavior During Therapy: WFL for tasks assessed/performed                                   General Comments: encouragement to perform activities on his own, first language [place] however appropriate communication, son also present      Exercises      General Comments        Pertinent Vitals/Pain Pain Assessment: No/denies pain    Home Living                      Prior Function            PT Goals (current goals can now be found in the care plan section) Progress towards PT goals: Progressing toward goals    Frequency    Min 3X/week      PT Plan Current plan remains appropriate    Co-evaluation              AM-PAC PT "6 Clicks" Daily Activity  Outcome Measure  Difficulty turning over in bed (including adjusting bedclothes, sheets and blankets)?: A  Lot Difficulty moving from lying on back to sitting on the side of the bed? : A Lot Difficulty sitting down on and standing up from a chair with arms (e.g., wheelchair, bedside commode, etc,.)?: Unable Help needed moving to and from a bed to chair (including a wheelchair)?: A Little Help needed walking in hospital room?: A Little Help needed climbing 3-5 steps with a railing? : A Little 6 Click Score: 14    End of Session   Activity Tolerance: Patient tolerated treatment well Patient left: with family/visitor present;Other (comment);with call bell/phone within reach(in bathroom, son present and to assist)   PT Visit Diagnosis: Muscle weakness (generalized) (M62.81);Difficulty in walking, not elsewhere classified (R26.2)     Time: 1015-1030 PT Time Calculation (min) (ACUTE ONLY): 15 min  Charges:  $Gait Training: 8-22 mins                    G Codes:       Carmelia Bake, PT, DPT 11/15/2017 Pager:  [phone]  York Ram E 11/15/2017, 1:06 PM

## 2017-11-16 ENCOUNTER — Other Ambulatory Visit: Payer: Self-pay | Admitting: Oncology

## 2017-11-16 LAB — GLUCOSE, CAPILLARY
GLUCOSE-CAPILLARY: 144 mg/dL — AB (ref 65–99)
GLUCOSE-CAPILLARY: 133 mg/dL — AB (ref 65–99)
Glucose-Capillary: 132 mg/dL — ABNORMAL HIGH (ref 65–99)
Glucose-Capillary: 128 mg/dL — ABNORMAL HIGH (ref 65–99)

## 2017-11-16 NOTE — Progress Notes (Signed)
Patient ID: Lee Pratt, male   DOB: 02-16-1934, 83 y.o.   MRN: [id number]  PROGRESS NOTE    Lee Pratt  CNO:[id number] DOB: 1934/01/15 DOA: 11/09/2017 PCP: [name], MD   Brief Narrative:  83 year old male presented from primary care providers office for evaluation of significant anemia weight loss.  In the ED, hemoglobin was 7.8; this can the abdomen showed circumferential wall thickening involving the gastric antrum suggestive of element of gastric outlet obstruction.  EGD showed large antral mass which  turned out to be adenocarcinoma.  Patient underwent upper endoscopy with biopsy and gastrojejunostomy.  Assessment & Plan:   Principal Problem:   Partial gastric outlet obstruction Active Problems:   HTN (hypertension)   Hyperlipidemia   Arrhythmia   Acute blood loss anemia   Abnormal CT scan, stomach   Nausea without vomiting   Abnormal loss of weight   Gastric tumor   Abnormal CT of the abdomen  Stage IV gastric adenocarcinoma - status post diagnostic laparoscopy with biopsy, gastrojejunostomy and G-tube and J-tube insertion by general surgery -General surgery following -outpatient follow up with Oncology at cancer center upon discharge  Acute Blood loss anemia/GI bleed probably secondary to above -2 units packed red cells transfusion on 11/10/2017 -Hemoglobin stable.  Monitor  Severe protein calorie malnutrition -Continue tube feedings per dietary recommendations  Hypokalemia -Repeat labs in a.m. And replace if needed  Hypertension -Monitor.  Blood pressure stable  Physical deconditioning -Patient will need home care on discharge   DVT prophylaxis: SCDs Code Status: Full Family Communication: Family at bedside Disposition Plan: Home in 2-3 days  Consultants: GI and general surgery   Procedures: EGD 11/10/2017  Diagnostic laparoscopy with biopsy, gastrojejunostomy, G tube placement and J tube.  Antimicrobials: Preop antibiotics only   Subjective: Patient  seen and examined at bedside.  No overnight fever or vomiting.  Objective: Vitals:   11/14/17 2009 11/15/17 0524 11/15/17 2026 11/16/17 0456  BP: (!) 147/80 138/67 130/66 125/72  Pulse: (!) 103 85 89 86  Resp: 16 17 16 18   Temp: 98.8 F (37.1 C) 97.6 F (36.4 C) 98.3 F (36.8 C) 98 F (36.7 C)  TempSrc: Oral Oral Oral Oral  SpO2: 97% 98% 98% 98%  Weight:  59 kg (130 lb)  54.7 kg (120 lb 11.2 oz)  Height:        Intake/Output Summary (Last 24 hours) at 11/16/2017 1012 Last data filed at 11/16/2017 0600 Gross per 24 hour  Intake 1315.83 ml  Output 2500 ml  Net -1184.17 ml   Filed Weights   11/14/17 1700 11/15/17 0524 11/16/17 0456  Weight: 53.5 kg (117 lb 14.4 oz) 59 kg (130 lb) 54.7 kg (120 lb 11.2 oz)    Examination:  General exam: Appears calm and comfortable  Respiratory system: Bilateral decreased breath sound at bases Cardiovascular system: S1 & S2 heard, rate controlled  gastrointestinal system: Abdomen is nondistended, soft with dressing present. Normal bowel sounds heard. Extremities: No cyanosis, clubbing, edema    Data Reviewed: I have personally reviewed following labs and imaging studies  CBC: Recent Labs  Lab 11/10/17 0504 11/11/17 0449 11/12/17 0400 11/13/17 0416 11/15/17 0423  WBC 5.4 8.8 9.8 12.2* 9.9  HGB 7.0* 9.7* 10.3* 10.0* 10.1*  HCT 21.1* 29.2* 32.2* 29.9* 30.6*  MCV 88.3 86.1 87.0 86.2 86.0  PLT 292 342 368 358 294   Basic Metabolic Panel: Recent Labs  Lab 11/10/17 0504 11/11/17 0449 11/12/17 0400 11/13/17 0416 11/15/17 0423  NA 137 136 135  135 136  K 3.8 3.9 3.8 4.1 3.3*  CL 106 108 107 108 109  CO2 21* 18* 17* 15* 20*  GLUCOSE 86 80 88 97 120*  BUN 18 12 12 13 14   CREATININE 0.92 0.93 0.91 1.03 0.83  CALCIUM 8.1* 8.2* 8.3* 8.3* 8.1*  MG 2.1  --   --   --   --    GFR: Estimated Creatinine Clearance: 54 mL/min (by C-G formula based on SCr of 0.83 mg/dL). Liver Function Tests: Recent Labs  Lab 11/10/17 0504  AST 20    ALT 14*  ALKPHOS 49  BILITOT 0.8  PROT 5.5*  ALBUMIN 2.7*   No results for input(s): LIPASE, AMYLASE in the last 168 hours. No results for input(s): AMMONIA in the last 168 hours. Coagulation Profile: No results for input(s): INR, PROTIME in the last 168 hours. Cardiac Enzymes: No results for input(s): CKTOTAL, CKMB, CKMBINDEX, TROPONINI in the last 168 hours. BNP (last 3 results) No results for input(s): PROBNP in the last 8760 hours. HbA1C: No results for input(s): HGBA1C in the last 72 hours. CBG: Recent Labs  Lab 11/15/17 1659 11/15/17 2021 11/15/17 2355 11/16/17 0442 11/16/17 0728  GLUCAP 134* 130* 138* 132* 144*   Lipid Profile: No results for input(s): CHOL, HDL, LDLCALC, TRIG, CHOLHDL, LDLDIRECT in the last 72 hours. Thyroid Function Tests: No results for input(s): TSH, T4TOTAL, FREET4, T3FREE, THYROIDAB in the last 72 hours. Anemia Panel: No results for input(s): VITAMINB12, FOLATE, FERRITIN, TIBC, IRON, RETICCTPCT in the last 72 hours. Sepsis Labs: No results for input(s): PROCALCITON, LATICACIDVEN in the last 168 hours.  Recent Results (from the past 240 hour(s))  Surgical pcr screen     Status: None   Collection Time: 11/11/17  1:18 PM  Result Value Ref Range Status   MRSA, PCR NEGATIVE NEGATIVE Final   Staphylococcus aureus NEGATIVE NEGATIVE Final    Comment: (NOTE) The Xpert SA Assay (FDA approved for NASAL specimens in patients 87 years of age and older), is one component of a comprehensive surveillance program. It is not intended to diagnose infection nor to guide or monitor treatment.          Radiology Studies: No results found.      Scheduled Meds: . enoxaparin (LOVENOX) injection  40 mg Subcutaneous Q24H  . feeding supplement  1 Container Oral Q24H  . feeding supplement (OSMOLITE 1.5 CAL)  1,000 mL Per Tube Q24H  . pantoprazole (PROTONIX) IV  40 mg Intravenous Q12H   Continuous Infusions: . sodium chloride 50 mL/hr at 11/15/17  1514     LOS: 6 days        Aline August, MD [facility] Pager [phone]  If 7PM-7AM, please contact night-coverage [email or web address] Password [email or web address] 11/16/2017, 10:12 AM

## 2017-11-16 NOTE — Progress Notes (Signed)
[facility] Progress Note  4 Days Post-Op  Subjective: CC:  Doing well, no complaints Objective: Vital signs in last 24 hours: Temp:  [98 F (36.7 C)-98.3 F (36.8 C)] 98 F (36.7 C) (11/17 0456) Pulse Rate:  [86-89] 86 (11/17 0456) Resp:  [16-18] 18 (11/17 0456) BP: (125-130)/(66-72) 125/72 (11/17 0456) SpO2:  [98 %] 98 % (11/17 0456) Weight:  [54.7 kg (120 lb 11.2 oz)] 54.7 kg (120 lb 11.2 oz) (11/17 0456) Last BM Date: 11/15/17  Intake/Output from previous day: 11/16 0701 - 11/17 0700 In: 1465.8 [I.V.:1150; NG/GT:215.8; IV Piggyback:100] Out: 2500 [Drains:2500] Intake/Output this shift: No intake/output data recorded.  PE: Gen:  Alert, NAD, pleasant Pulm:  Normal effort   Abd: Soft, non-tender, gastrostomy tube in place, draining bilious fluid to gravity. J-tube with TF's at 76ml/h. Midline incision with honeycomb in place, no signs of infection Skin: warm and dry, no rashes  Psych: A&Ox3   Lab Results:  Recent Labs    11/15/17 0423  WBC 9.9  HGB 10.1*  HCT 30.6*  PLT 357   BMET Recent Labs    11/15/17 0423  NA 136  K 3.3*  CL 109  CO2 20*  GLUCOSE 120*  BUN 14  CREATININE 0.83  CALCIUM 8.1*   PT/INR No results for input(s): LABPROT, INR in the last 72 hours. CMP     Component Value Date/Time   NA 136 11/15/2017 0423   NA 134 11/08/2017 1656   K 3.3 (L) 11/15/2017 0423   CL 109 11/15/2017 0423   CO2 20 (L) 11/15/2017 0423   GLUCOSE 120 (H) 11/15/2017 0423   BUN 14 11/15/2017 0423   BUN 22 11/08/2017 1656   CREATININE 0.83 11/15/2017 0423   CREATININE 0.99 07/20/2013 0941   CALCIUM 8.1 (L) 11/15/2017 0423   PROT 5.5 (L) 11/10/2017 0504   PROT 6.6 11/08/2017 1656   ALBUMIN 2.7 (L) 11/10/2017 0504   ALBUMIN 4.0 11/08/2017 1656   AST 20 11/10/2017 0504   ALT 14 (L) 11/10/2017 0504   ALKPHOS 49 11/10/2017 0504   BILITOT 0.8 11/10/2017 0504   BILITOT 0.3 11/08/2017 1656   GFRNONAA >60 11/15/2017 0423   GFRAA >60 11/15/2017  0423   Lipase     Component Value Date/Time   LIPASE 26 11/09/2017 0931   Studies/Results: No results found.  Anti-infectives: Anti-infectives (From admission, onward)   Start     Dose/Rate Route Frequency Ordered Stop   11/12/17 1230  ceFAZolin (ANCEF) IVPB 2g/100 mL premix     2 g 200 mL/hr over 30 Minutes Intravenous On call to O.R. 11/11/17 1055 11/12/17 1026   11/12/17 0913  ceFAZolin (ANCEF) 2-4 GM/100ML-% IVPB    Comments:  Bridget Hartshorn   : cabinet override      11/12/17 0913 11/12/17 1026   11/11/17 1130  ceFAZolin (ANCEF) IVPB 2g/100 mL premix  Status:  Discontinued     2 g 200 mL/hr over 30 Minutes Intravenous On call to O.R. 11/11/17 1045 11/11/17 1056     Assessment/Plan GOO 2/2 stage 4 gastric adenocarcinoma  S/P Diagnostic Laparoscopy with biopsy, gastojejunostomy, insertion of G-tube and J-tube - intraoperatively peritoneal nodules were removed for frozen biopsy and tested positive for carcinomatosis.  - continue G-tube to gravity for now until [place] anastomosis starts to drain stomach - oncology consulted to direct further workup and management - pain control, mobilize, IS   FEN: clears for comfort, nutrition aware the need for cycling feeds  ID: perioperative Ancef  11/13  VTE: SCD's, Ok to resume chemical VTE . Foley: d/c'd 11/15    LOS: 6 days    Rosario Adie, MD  [facility]

## 2017-11-17 ENCOUNTER — Encounter (HOSPITAL_COMMUNITY): Payer: Self-pay

## 2017-11-17 DIAGNOSIS — E876 Hypokalemia: Secondary | ICD-10-CM

## 2017-11-17 LAB — BASIC METABOLIC PANEL
Anion gap: 8 (ref 5–15)
BUN: 21 mg/dL — AB (ref 6–20)
CALCIUM: 8.5 mg/dL — AB (ref 8.9–10.3)
CO2: 23 mmol/L (ref 22–32)
Chloride: 110 mmol/L (ref 101–111)
Creatinine, Ser: 0.79 mg/dL (ref 0.61–1.24)
GFR calc Af Amer: >60 mL/min (ref >60)
GLUCOSE: 143 mg/dL — AB (ref 65–99)
Potassium: 3.3 mmol/L — ABNORMAL LOW (ref 3.5–5.1)
Sodium: 141 mmol/L (ref 135–145)

## 2017-11-17 LAB — CBC WITH DIFFERENTIAL/PLATELET
BASOS ABS: 0 10*3/uL (ref 0.0–0.1)
Basophils Relative: 1 %
EOS ABS: 0.5 10*3/uL (ref 0.0–0.7)
EOS PCT: 6 %
HCT: 30.8 % — ABNORMAL LOW (ref 39.0–52.0)
Hemoglobin: 9.9 g/dL — ABNORMAL LOW (ref 13.0–17.0)
LYMPHS ABS: 1.2 10*3/uL (ref 0.7–4.0)
LYMPHS PCT: 17 %
MCH: 28 pg (ref 26.0–34.0)
MCHC: 32.1 g/dL (ref 30.0–36.0)
MCV: 87 fL (ref 78.0–100.0)
MONO ABS: 0.9 10*3/uL (ref 0.1–1.0)
Monocytes Relative: 12 %
Neutro Abs: 4.7 10*3/uL (ref 1.7–7.7)
Neutrophils Relative %: 65 %
PLATELETS: 359 10*3/uL (ref 150–400)
RBC: 3.54 MIL/uL — ABNORMAL LOW (ref 4.22–5.81)
RDW: 15.3 % (ref 11.5–15.5)
WBC: 7.3 10*3/uL (ref 4.0–10.5)

## 2017-11-17 LAB — PHOSPHORUS: Phosphorus: 1.9 mg/dL — ABNORMAL LOW (ref 2.5–4.6)

## 2017-11-17 LAB — GLUCOSE, CAPILLARY
GLUCOSE-CAPILLARY: 135 mg/dL — AB (ref 65–99)
GLUCOSE-CAPILLARY: 119 mg/dL — AB (ref 65–99)
GLUCOSE-CAPILLARY: 116 mg/dL — AB (ref 65–99)
Glucose-Capillary: 126 mg/dL — ABNORMAL HIGH (ref 65–99)
Glucose-Capillary: 111 mg/dL — ABNORMAL HIGH (ref 65–99)
Glucose-Capillary: 125 mg/dL — ABNORMAL HIGH (ref 65–99)
Glucose-Capillary: 147 mg/dL — ABNORMAL HIGH (ref 65–99)

## 2017-11-17 LAB — MAGNESIUM: Magnesium: 2 mg/dL (ref 1.7–2.4)

## 2017-11-17 MED ORDER — POTASSIUM CHLORIDE 10 MEQ/100ML IV SOLN
10.0000 meq | INTRAVENOUS | Status: AC
  Administered 2017-11-17 (×2): 10 meq via INTRAVENOUS
  Filled 2017-11-17 (×2): qty 100

## 2017-11-17 NOTE — Progress Notes (Signed)
Per pt, [facility] is scheduled to deliver hospital bed on 11/18/2017 from 10-2 pm. Will need tube feeding orders for [facility]. Jonnie Finner RN CCM Case Mgmt phone [phone]

## 2017-11-17 NOTE — Progress Notes (Signed)
[facility] Progress Note  5 Days Post-Op  Subjective: CC:  Doing well, no complaints  Objective: Vital signs in last 24 hours: Temp:  [97.5 F (36.4 C)-98.4 F (36.9 C)] 97.5 F (36.4 C) (11/18 0450) Pulse Rate:  [85-90] 90 (11/18 0450) Resp:  [18-19] 18 (11/18 0450) BP: (126-139)/(65-72) 126/65 (11/18 0450) SpO2:  [98 %-99 %] 99 % (11/18 0450) Weight:  [53.8 kg (118 lb 9.7 oz)] 53.8 kg (118 lb 9.7 oz) (11/18 0450) Last BM Date: 11/15/17  Intake/Output from previous day: 11/17 0701 - 11/18 0700 In: 1695 [I.V.:1000; NG/GT:675] Out: 1353 [Urine:3; Drains:1350] Intake/Output this shift: No intake/output data recorded.  PE: Gen:  Alert, NAD, pleasant Pulm:  Normal effort   Abd: Soft, non-tender, gastrostomy tube in place, draining bilious fluid to gravity. J-tube with TF's at goal. Midline incision with no signs of infection Skin: warm and dry, no rashes  Psych: A&Ox3   Lab Results:  Recent Labs    11/15/17 0423 11/17/17 0507  WBC 9.9 7.3  HGB 10.1* 9.9*  HCT 30.6* 30.8*  PLT 357 359   BMET Recent Labs    11/15/17 0423 11/17/17 0507  NA 136 141  K 3.3* 3.3*  CL 109 110  CO2 20* 23  GLUCOSE 120* 143*  BUN 14 21*  CREATININE 0.83 0.79  CALCIUM 8.1* 8.5*   PT/INR No results for input(s): LABPROT, INR in the last 72 hours. CMP     Component Value Date/Time   NA 141 11/17/2017 0507   NA 134 11/08/2017 1656   K 3.3 (L) 11/17/2017 0507   CL 110 11/17/2017 0507   CO2 23 11/17/2017 0507   GLUCOSE 143 (H) 11/17/2017 0507   BUN 21 (H) 11/17/2017 0507   BUN 22 11/08/2017 1656   CREATININE 0.79 11/17/2017 0507   CREATININE 0.99 07/20/2013 0941   CALCIUM 8.5 (L) 11/17/2017 0507   PROT 5.5 (L) 11/10/2017 0504   PROT 6.6 11/08/2017 1656   ALBUMIN 2.7 (L) 11/10/2017 0504   ALBUMIN 4.0 11/08/2017 1656   AST 20 11/10/2017 0504   ALT 14 (L) 11/10/2017 0504   ALKPHOS 49 11/10/2017 0504   BILITOT 0.8 11/10/2017 0504   BILITOT 0.3 11/08/2017 1656    GFRNONAA >60 11/17/2017 0507   GFRAA >60 11/17/2017 0507   Lipase     Component Value Date/Time   LIPASE 26 11/09/2017 0931   Studies/Results: No results found.  Anti-infectives: Anti-infectives (From admission, onward)   Start     Dose/Rate Route Frequency Ordered Stop   11/12/17 1230  ceFAZolin (ANCEF) IVPB 2g/100 mL premix     2 g 200 mL/hr over 30 Minutes Intravenous On call to O.R. 11/11/17 1055 11/12/17 1026   11/12/17 0913  ceFAZolin (ANCEF) 2-4 GM/100ML-% IVPB    Comments:  Bridget Hartshorn   : cabinet override      11/12/17 0913 11/12/17 1026   11/11/17 1130  ceFAZolin (ANCEF) IVPB 2g/100 mL premix  Status:  Discontinued     2 g 200 mL/hr over 30 Minutes Intravenous On call to O.R. 11/11/17 1045 11/11/17 1056     Assessment/Plan GOO 2/2 stage 4 gastric adenocarcinoma  S/P Diagnostic Laparoscopy with biopsy, gastojejunostomy, insertion of G-tube and J-tube - intraoperatively peritoneal nodules were removed for frozen biopsy and tested positive for carcinomatosis.  - continue G-tube to gravity for now until [place] anastomosis starts to drain stomach - oncology consulted to direct further workup and management - pain control, mobilize, IS   FEN:  clears for comfort, nutrition aware the need for cycling feeds  ID: perioperative Ancef 11/13  VTE: SCD's, Ok to resume chemical VTE . Foley: d/c'd 11/15    LOS: 7 days    Rosario Adie, MD  [facility]

## 2017-11-17 NOTE — Progress Notes (Addendum)
Progress Note for the Evaluation of Need for a Hospital Bed Patient Name: ___[name]_________________________________        DOB: ______2035-08-17__________________________ Diagnosis Codes: _D49.0, K31.1__________________________________              Height: __5'9________        Weight: __118________    Problems with Aspiration  Patient spends ____85-100________ % of the time in bed. ______Aspiration and dysphagia________________________________ causes frequent________gagging, choking_________. Torso required to be elevated at least ____30-45 degrees_________ to prevent aspiration. Bed wedges do not provid adequate elevation to resolve issues with aspiration. ____Gagging, choking_______________ requires frequent and immediate changes in body  position which cannot be achieved with a normal bed.   Clinician Signature/Credentials: Jonnie Finner RN CCm ________________________________________________________________Alesia Marcheta Grammes RN CCM Case Mgmt phone [phone]

## 2017-11-17 NOTE — Progress Notes (Signed)
He is hesitant about walking today, but he plans to walk when his other son comes to visit in the afternoon. He says he is in no pain except when he moves in certain ways and even then its just tenderness. His sons are very addiment about knowing his discharge plan since [place]. Nurse was made aware.

## 2017-11-17 NOTE — Progress Notes (Signed)
Patient ID: Lee Pratt, male   DOB: 06/02/1934, 83 y.o.   MRN: [id number]  PROGRESS NOTE    Kaicen Desena  CBS:[id number] DOB: 06/03/1934 DOA: 11/09/2017 PCP: [name], MD   Brief Narrative:  83 year old male presented from primary care providers office for evaluation of significant anemia weight loss.  In the ED, hemoglobin was 7.8; this can the abdomen showed circumferential wall thickening involving the gastric antrum suggestive of element of gastric outlet obstruction.  EGD showed large antral mass which  turned out to be adenocarcinoma.  Patient underwent upper endoscopy with biopsy and gastrojejunostomy.  Assessment & Plan:   Principal Problem:   Partial gastric outlet obstruction Active Problems:   HTN (hypertension)   Hyperlipidemia   Arrhythmia   Acute blood loss anemia   Abnormal CT scan, stomach   Nausea without vomiting   Abnormal loss of weight   Gastric tumor   Abnormal CT of the abdomen  Stage IV gastric adenocarcinoma - status post diagnostic laparoscopy with biopsy, gastrojejunostomy and G-tube and J-tube insertion by general surgery -General surgery following -outpatient follow up with Oncology at cancer center upon discharge.  Spoke with Dr. Magrinat/oncology on phone on 11/16/2017 and he mentioned that he would arrange for outpatient follow-up at the cancer center upon discharge with 1 of the oncologists.  Acute Blood loss anemia/GI bleed probably secondary to above -2 units packed red cells transfusion on 11/10/2017 -Hemoglobin stable.  Monitor  Severe protein calorie malnutrition -Continue tube feedings per dietary recommendations  Hypokalemia -Replace.  Repeat a.m. labs  Hypertension -Monitor.  Blood pressure stable  Physical deconditioning -Patient will need home care on discharge   DVT prophylaxis: SCDs Code Status: Full Family Communication: Spoke to son present at bedside Disposition Plan: Home in 2-3 days cleared by general surgery  Consultants:  GI and general surgery Spoke to Dr. Magrinat/oncology on 11/16/2017 on phone  Procedures: EGD 11/10/2017  Diagnostic laparoscopy with biopsy, gastrojejunostomy, G tube placement and J tube.  Antimicrobials: Preop antibiotics only   Subjective: Patient seen and examined at bedside.  No overnight fever or vomiting.  Patient is tolerating tube feeding  Objective: Vitals:   11/16/17 0456 11/16/17 1443 11/16/17 2024 11/17/17 0450  BP: 125/72 139/72 129/68 126/65  Pulse: 86 85 89 90  Resp: 18 19 18 18   Temp: 98 F (36.7 C) 97.9 F (36.6 C) 98.4 F (36.9 C) (!) 97.5 F (36.4 C)  TempSrc: Oral Oral Oral Oral  SpO2: 98% 99% 98% 99%  Weight: 54.7 kg (120 lb 11.2 oz)   53.8 kg (118 lb 9.7 oz)  Height:        Intake/Output Summary (Last 24 hours) at 11/17/2017 1049 Last data filed at 11/17/2017 8466 Gross per 24 hour  Intake 1695 ml  Output 1353 ml  Net 342 ml   Filed Weights   11/15/17 0524 11/16/17 0456 11/17/17 0450  Weight: 59 kg (130 lb) 54.7 kg (120 lb 11.2 oz) 53.8 kg (118 lb 9.7 oz)    Examination:  General exam: Appears calm and comfortable; no acute distress Respiratory system: Bilateral decreased breath sound at bases Cardiovascular system: S1 & S2 heard, rate controlled  gastrointestinal system: Abdomen is nondistended, soft with dressing present. Normal bowel sounds heard. Extremities: No cyanosis, clubbing, edema    Data Reviewed: I have personally reviewed following labs and imaging studies  CBC: Recent Labs  Lab 11/11/17 0449 11/12/17 0400 11/13/17 0416 11/15/17 0423 11/17/17 0507  WBC 8.8 9.8 12.2* 9.9 7.3  NEUTROABS  --   --   --   --  4.7  HGB 9.7* 10.3* 10.0* 10.1* 9.9*  HCT 29.2* 32.2* 29.9* 30.6* 30.8*  MCV 86.1 87.0 86.2 86.0 87.0  PLT 342 368 358 357 517   Basic Metabolic Panel: Recent Labs  Lab 11/11/17 0449 11/12/17 0400 11/13/17 0416 11/15/17 0423 11/17/17 0507  NA 136 135 135 136 141  K 3.9 3.8 4.1 3.3* 3.3*  CL 108 107 108  109 110  CO2 18* 17* 15* 20* 23  GLUCOSE 80 88 97 120* 143*  BUN 12 12 13 14  21*  CREATININE 0.93 0.91 1.03 0.83 0.79  CALCIUM 8.2* 8.3* 8.3* 8.1* 8.5*  MG  --   --   --   --  2.0  PHOS  --   --   --   --  1.9*   GFR: Estimated Creatinine Clearance: 55.1 mL/min (by C-G formula based on SCr of 0.79 mg/dL). Liver Function Tests: No results for input(s): AST, ALT, ALKPHOS, BILITOT, PROT, ALBUMIN in the last 168 hours. No results for input(s): LIPASE, AMYLASE in the last 168 hours. No results for input(s): AMMONIA in the last 168 hours. Coagulation Profile: No results for input(s): INR, PROTIME in the last 168 hours. Cardiac Enzymes: No results for input(s): CKTOTAL, CKMB, CKMBINDEX, TROPONINI in the last 168 hours. BNP (last 3 results) No results for input(s): PROBNP in the last 8760 hours. HbA1C: No results for input(s): HGBA1C in the last 72 hours. CBG: Recent Labs  Lab 11/16/17 1646 11/16/17 2021 11/17/17 0120 11/17/17 0440 11/17/17 0756  GLUCAP 133* 126* 111* 125* 147*   Lipid Profile: No results for input(s): CHOL, HDL, LDLCALC, TRIG, CHOLHDL, LDLDIRECT in the last 72 hours. Thyroid Function Tests: No results for input(s): TSH, T4TOTAL, FREET4, T3FREE, THYROIDAB in the last 72 hours. Anemia Panel: No results for input(s): VITAMINB12, FOLATE, FERRITIN, TIBC, IRON, RETICCTPCT in the last 72 hours. Sepsis Labs: No results for input(s): PROCALCITON, LATICACIDVEN in the last 168 hours.  Recent Results (from the past 240 hour(s))  Surgical pcr screen     Status: None   Collection Time: 11/11/17  1:18 PM  Result Value Ref Range Status   MRSA, PCR NEGATIVE NEGATIVE Final   Staphylococcus aureus NEGATIVE NEGATIVE Final    Comment: (NOTE) The Xpert SA Assay (FDA approved for NASAL specimens in patients 86 years of age and older), is one component of a comprehensive surveillance program. It is not intended to diagnose infection nor to guide or monitor treatment.           Radiology Studies: No results found.      Scheduled Meds: . enoxaparin (LOVENOX) injection  40 mg Subcutaneous Q24H  . feeding supplement  1 Container Oral Q24H  . feeding supplement (OSMOLITE 1.5 CAL)  1,000 mL Per Tube Q24H  . pantoprazole (PROTONIX) IV  40 mg Intravenous Q12H   Continuous Infusions: . sodium chloride 50 mL/hr at 11/15/17 1514     LOS: 7 days        Aline August, MD [facility] Pager [phone]  If 7PM-7AM, please contact night-coverage [email or web address] Password [email or web address] 11/17/2017, 10:49 AM

## 2017-11-18 ENCOUNTER — Encounter: Payer: Self-pay | Admitting: Hematology

## 2017-11-18 ENCOUNTER — Telehealth: Payer: Self-pay | Admitting: Hematology

## 2017-11-18 DIAGNOSIS — D490 Neoplasm of unspecified behavior of digestive system: Secondary | ICD-10-CM

## 2017-11-18 DIAGNOSIS — E440 Moderate protein-calorie malnutrition: Secondary | ICD-10-CM

## 2017-11-18 LAB — GLUCOSE, CAPILLARY
GLUCOSE-CAPILLARY: 121 mg/dL — AB (ref 65–99)
GLUCOSE-CAPILLARY: 135 mg/dL — AB (ref 65–99)
GLUCOSE-CAPILLARY: 141 mg/dL — AB (ref 65–99)
Glucose-Capillary: 116 mg/dL — ABNORMAL HIGH (ref 65–99)
Glucose-Capillary: 114 mg/dL — ABNORMAL HIGH (ref 65–99)
Glucose-Capillary: 113 mg/dL — ABNORMAL HIGH (ref 65–99)

## 2017-11-18 LAB — BASIC METABOLIC PANEL
Anion gap: 6 (ref 5–15)
BUN: 27 mg/dL — ABNORMAL HIGH (ref 6–20)
CALCIUM: 8.6 mg/dL — AB (ref 8.9–10.3)
CHLORIDE: 110 mmol/L (ref 101–111)
CO2: 24 mmol/L (ref 22–32)
CREATININE: 0.74 mg/dL (ref 0.61–1.24)
GFR calc non Af Amer: >60 mL/min (ref >60)
Glucose, Bld: 127 mg/dL — ABNORMAL HIGH (ref 65–99)
Potassium: 3.6 mmol/L (ref 3.5–5.1)
SODIUM: 140 mmol/L (ref 135–145)

## 2017-11-18 LAB — CBC WITH DIFFERENTIAL/PLATELET
BASOS PCT: 0 %
Basophils Absolute: 0 10*3/uL (ref 0.0–0.1)
EOS ABS: 0.6 10*3/uL (ref 0.0–0.7)
Eosinophils Relative: 9 %
HCT: 31.3 % — ABNORMAL LOW (ref 39.0–52.0)
HEMOGLOBIN: 10 g/dL — AB (ref 13.0–17.0)
LYMPHS ABS: 1.3 10*3/uL (ref 0.7–4.0)
LYMPHS PCT: 20 %
MCH: 28.2 pg (ref 26.0–34.0)
MCHC: 31.9 g/dL (ref 30.0–36.0)
MCV: 88.2 fL (ref 78.0–100.0)
MONO ABS: 0.5 10*3/uL (ref 0.1–1.0)
MONOS PCT: 9 %
NEUTROS ABS: 3.8 10*3/uL (ref 1.7–7.7)
NEUTROS PCT: 62 %
Platelets: 359 10*3/uL (ref 150–400)
RBC: 3.55 MIL/uL — ABNORMAL LOW (ref 4.22–5.81)
RDW: 15.8 % — AB (ref 11.5–15.5)
WBC: 6.2 10*3/uL (ref 4.0–10.5)

## 2017-11-18 LAB — MAGNESIUM: MAGNESIUM: 2.1 mg/dL (ref 1.7–2.4)

## 2017-11-18 MED ORDER — OSMOLITE 1.5 CAL PO LIQD
1000.0000 mL | ORAL | Status: DC
  Administered 2017-11-18 – 2017-11-19 (×2): 1000 mL
  Filled 2017-11-18 (×5): qty 1000

## 2017-11-18 NOTE — Telephone Encounter (Signed)
Appt has been scheduled for the pt to see Dr. Burr Medico on 12/5 at 230pm. Letter mailed to the pt.

## 2017-11-18 NOTE — Care Management Important Message (Signed)
Important Message  Patient Details  Name: Lee Pratt MRN: [id number] Date of Birth: [birth year]   Medicare Important Message Given:  Yes    Kerin Salen 11/18/2017, 10:50 AMImportant Message  Patient Details  Name: Lee Pratt MRN: [id number] Date of Birth: May 27, 1934   Medicare Important Message Given:  Yes    Kerin Salen 11/18/2017, 10:50 AM

## 2017-11-18 NOTE — Progress Notes (Signed)
Patient ID: Lee Pratt, male   DOB: 1934/09/10, 83 y.o.   MRN: [id number]  [facility] Surgery Progress Note  6 Days Post-Op  Subjective: CC-  No complaints today. Tolerating TF at goal. Had a BM this morning. Denies abdominal pain, nausea, vomiting.   Objective: Vital signs in last 24 hours: Temp:  [98 F (36.7 C)-98.7 F (37.1 C)] 98.7 F (37.1 C) (11/19 0618) Pulse Rate:  [78-81] 79 (11/19 0618) Resp:  [16-20] 18 (11/19 0618) BP: (120-130)/(65-77) 130/77 (11/19 0618) SpO2:  [98 %-99 %] 98 % (11/18 2125) Weight:  [123 lb 14.4 oz (56.2 kg)] 123 lb 14.4 oz (56.2 kg) (11/19 0618) Last BM Date: 11/18/17  Intake/Output from previous day: 11/18 0701 - 11/19 0700 In: 560 [NG/GT:560] Out: 2025 [Urine:1400; Drains:625] Intake/Output this shift: No intake/output data recorded.  PE: Gen:  Alert, NAD, pleasant HEENT: EOM's intact, pupils equal and round Pulm: effort normal Abd: Soft, NT/ND, +BS, midline incision C/D/I, J-tube with TF at 15mL/hr, G-tube to gravity draining bilious fluid Psych: A&Ox3  Skin: no rashes noted, warm and dry  Lab Results:  Recent Labs    11/17/17 0507 11/18/17 0513  WBC 7.3 6.2  HGB 9.9* 10.0*  HCT 30.8* 31.3*  PLT 359 359   BMET Recent Labs    11/17/17 0507 11/18/17 0513  NA 141 140  K 3.3* 3.6  CL 110 110  CO2 23 24  GLUCOSE 143* 127*  BUN 21* 27*  CREATININE 0.79 0.74  CALCIUM 8.5* 8.6*   PT/INR No results for input(s): LABPROT, INR in the last 72 hours. CMP     Component Value Date/Time   NA 140 11/18/2017 0513   NA 134 11/08/2017 1656   K 3.6 11/18/2017 0513   CL 110 11/18/2017 0513   CO2 24 11/18/2017 0513   GLUCOSE 127 (H) 11/18/2017 0513   BUN 27 (H) 11/18/2017 0513   BUN 22 11/08/2017 1656   CREATININE 0.74 11/18/2017 0513   CREATININE 0.99 07/20/2013 0941   CALCIUM 8.6 (L) 11/18/2017 0513   PROT 5.5 (L) 11/10/2017 0504   PROT 6.6 11/08/2017 1656   ALBUMIN 2.7 (L) 11/10/2017 0504   ALBUMIN 4.0 11/08/2017 1656    AST 20 11/10/2017 0504   ALT 14 (L) 11/10/2017 0504   ALKPHOS 49 11/10/2017 0504   BILITOT 0.8 11/10/2017 0504   BILITOT 0.3 11/08/2017 1656   GFRNONAA >60 11/18/2017 0513   GFRAA >60 11/18/2017 0513   Lipase     Component Value Date/Time   LIPASE 26 11/09/2017 0931       Studies/Results: No results found.  Anti-infectives: Anti-infectives (From admission, onward)   Start     Dose/Rate Route Frequency Ordered Stop   11/12/17 1230  ceFAZolin (ANCEF) IVPB 2g/100 mL premix     2 g 200 mL/hr over 30 Minutes Intravenous On call to O.R. 11/11/17 1055 11/12/17 1026   11/12/17 0913  ceFAZolin (ANCEF) 2-4 GM/100ML-% IVPB    Comments:  Bridget Hartshorn   : cabinet override      11/12/17 0913 11/12/17 1026   11/11/17 1130  ceFAZolin (ANCEF) IVPB 2g/100 mL premix  Status:  Discontinued     2 g 200 mL/hr over 30 Minutes Intravenous On call to O.R. 11/11/17 1045 11/11/17 1056       Assessment/Plan HTN  GOO 2/2 stage 4 gastric adenocarcinoma  S/P Diagnostic Laparoscopy with biopsy, gastojejunostomy, insertion of G-tube and J-tube 11/13 Dr. Marcello Moores - POD 6 - intraoperatively peritoneal nodules were removed  for frozen biopsy and tested positive for carcinomatosis.  - oncology consulted to direct further workup and management and planning for outpatient followup - pain control, mobilize, IS  - patient tolerating TF at 51mL/hr (at goal), and having bowel function - will clamp G tube today and see if patient tolerates  FEN: clears for comfort, Boost, RD to see patient today to discuss need for cycling feeds  ID: perioperative Ancef 11/13  VTE: SCD's, Lovenox Foley: d/c'd 11/15 Follow up: Dr. Marcello Moores, oncology   LOS: 8 days    Wellington Hampshire , [facility] Surgery 11/18/2017, 8:51 AM Pager: [phone] Consults: [phone] Mon-Fri 7:00 am-4:30 pm Sat-Sun 7:00 am-11:30 am

## 2017-11-18 NOTE — Progress Notes (Signed)
  Oncology Nurse Navigator Documentation  Navigator Location: [facility] (11/18/17 1324) Referral date to RadOnc/MedOnc: 11/18/17 (11/18/17 1324) )Navigator Encounter Type: Other(Inpatient visit) (11/18/17 1324)   Abnormal Finding Date: 11/09/17 (11/18/17 1324) Confirmed Diagnosis Date: 11/12/17 (11/18/17 1324) Surgery Date: 11/12/17 (11/18/17 1324)           Treatment Initiated Date: 11/12/17 (11/18/17 1324) Patient Visit Type: Inpatient (11/18/17 1324)   Barriers/Navigation Needs: (Language, patient speaks little English) (11/18/17 1324)   Interventions: Psycho-social support (11/18/17 1324)  I met with patient and wife on 4th floor inpatient unit. There was a language barrier. I gave patient and wife my card and contact information. I will call the Office of  Inclusion to arrange an interpreter prior to initial med/onc appointment.          Acuity: Level 1 (11/18/17 1324) Acuity Level 1: Initial guidance, education and coordination as needed (11/18/17 1324)       Time Spent with Patient: 15 (11/18/17 1324)

## 2017-11-18 NOTE — Progress Notes (Signed)
Nutrition Follow-up  DOCUMENTATION CODES:   Non-severe (moderate) malnutrition in context of acute illness/injury  INTERVENTION:   11/19: Will transition to 16 hour feeding regimen: -Will stop Osmolite 1.5 at 1000. -Resume Osmolite 1.5 @ 65 ml/hr via J-tube at 1800-1000.  Goal rate: Osmolite 1.5 @ 85 ml/hr x 16 hours via J-tube (1800-1000). Free water: 240 ml TID  This provides 2040 kcal, 85g protein and 1756 ml H2O.   -Provided patient with TF booklet, outlining TF regimen.  NUTRITION DIAGNOSIS:   Moderate Malnutrition related to acute illness(gastric outlet obstruction) as evidenced by energy intake < or equal to 50% for > or equal to 1 month, moderate fat depletion, severe fat depletion, severe muscle depletion.  Ongoing.  GOAL:   Patient will meet greater than or equal to 90% of their needs   Meeting with TF.  MONITOR:   TF tolerance, PO intake, Diet advancement, Weight trends, Labs, Skin  REASON FOR ASSESSMENT:   Consult Enteral/tube feeding initiation and management  ASSESSMENT:   Pt with PMH significant for HTN. Presents this admission with complaints of decreased appetite and abdominal distention. CT scan showed at partial gastric outlet obstruction. Admitted for anemia secondary to GI blood loss.   Patient in room with family at bedside. Pt reports tolerating TF with no issues. Currently receiving Osmolite 1.5 @ 55 ml/hr, this is goal rate for 24 hours.  Spoke with surgical PA regarding patient's home regimen.  Pt prefers to transition to 16 hour feeding regimen, he will be off the pump for 8 hours. Goal rate explained and that we would start the transition today. Will resume TF tonight at 65 ml/hr at 1800.  Will provide patient a booklet explaining TF regimen.   Medications: IV Protonix BID Labs reviewed: CBGs: 121-135  Diet Order:  Diet clear liquid Room service appropriate? Yes; Fluid consistency: Thin  EDUCATION NEEDS:   Education needs have been  addressed  Skin:  Skin Assessment: Skin Integrity Issues: Skin Integrity Issues:: Incisions Incisions: Abdominal  Last BM:  11/12  Height:   Ht Readings from Last 1 Encounters:  11/09/17 5\' 9"  (1.753 m)    Weight:   Wt Readings from Last 1 Encounters:  11/18/17 123 lb 14.4 oz (56.2 kg)    Ideal Body Weight:  72.7 kg  BMI:  Body mass index is 18.3 kg/m.  Estimated Nutritional Needs:   Kcal:  1800-2000 kcal/day  Protein:  90-100 g/day  Fluid:  >1.8 L/day  Clayton Bibles, MS, RD, LDN [place] [facility] Pager: [phone] After Hours Pager: [phone]

## 2017-11-18 NOTE — Progress Notes (Signed)
Patient ID: Lee Pratt, male   DOB: 19-Nov-1934, 83 y.o.   MRN: [id number]  PROGRESS NOTE    Bueford Arp  IRS:[id number] DOB: [birth year] DOA: 11/09/2017 PCP: [name], MD   Brief Narrative:  83 year old male presented from primary care providers office for evaluation of significant anemia weight loss.  In the ED, hemoglobin was 7.8; this can the abdomen showed circumferential wall thickening involving the gastric antrum suggestive of element of gastric outlet obstruction.  EGD showed large antral mass which  turned out to be adenocarcinoma.  Patient underwent upper endoscopy with biopsy and gastrojejunostomy.  Assessment & Plan:   Principal Problem:   Partial gastric outlet obstruction Active Problems:   HTN (hypertension)   Hyperlipidemia   Arrhythmia   Acute blood loss anemia   Abnormal CT scan, stomach   Nausea without vomiting   Abnormal loss of weight   Gastric tumor   Abnormal CT of the abdomen  Stage IV gastric adenocarcinoma - status post diagnostic laparoscopy with biopsy, gastrojejunostomy and G-tube and J-tube insertion by general surgery -General surgery following -outpatient follow up with Oncology at cancer center upon discharge.  Spoke with Dr. Magrinat/oncology on phone on 11/16/2017 and he mentioned that he would arrange for outpatient follow-up at the cancer center upon discharge with one of the oncologists.  Acute Blood loss anemia/GI bleed probably secondary to above -2 units packed red cells transfusion on 11/10/2017 -Hemoglobin stable.  Monitor  Moderate protein calorie malnutrition -Continue tube feedings per dietary recommendations.  Nutrition reconsulted to give recommendations regarding tube feedings at home.  Hypokalemia -Improved  Hypertension -Monitor.  Blood pressure stable  Physical deconditioning -Patient will need home care on discharge   DVT prophylaxis: SCDs Code Status: Full Family Communication: Spoke to wife present at  bedside Disposition Plan: Home in 1-3 days once cleared by general surgery  Consultants: GI and general surgery Spoke to Dr. Magrinat/oncology on 11/16/2017 on phone  Procedures: EGD 11/10/2017  Diagnostic laparoscopy with biopsy, gastrojejunostomy, G tube placement and J tube.  Antimicrobials: Preop antibiotics only   Subjective: Patient seen and examined at bedside.  No overnight fever or vomiting.  Patient is tolerating tube feeding.  No chest pain or shortness of breath  Objective: Vitals:   11/17/17 0450 11/17/17 1318 11/17/17 2125 11/18/17 0618  BP: 126/65 120/65 128/68 130/77  Pulse: 90 81 78 79  Resp: 18 20 16 18   Temp: (!) 97.5 F (36.4 C) 98 F (36.7 C) 98.6 F (37 C) 98.7 F (37.1 C)  TempSrc: Oral Oral Oral Oral  SpO2: 99% 99% 98%   Weight: 53.8 kg (118 lb 9.7 oz)   56.2 kg (123 lb 14.4 oz)  Height:        Intake/Output Summary (Last 24 hours) at 11/18/2017 1324 Last data filed at 11/18/2017 0600 Gross per 24 hour  Intake 560 ml  Output 2025 ml  Net -1465 ml   Filed Weights   11/16/17 0456 11/17/17 0450 11/18/17 0618  Weight: 54.7 kg (120 lb 11.2 oz) 53.8 kg (118 lb 9.7 oz) 56.2 kg (123 lb 14.4 oz)    Examination:  General exam: No acute distress  respiratory system: Bilateral decreased breath sound at bases Cardiovascular system: S1 & S2 heard, rate controlled  gastrointestinal system: Abdomen is nondistended, soft with dressing present. Normal bowel sounds heard. Extremities: No cyanosis, clubbing, edema    Data Reviewed: I have personally reviewed following labs and imaging studies  CBC: Recent Labs  Lab 11/12/17 0400 11/13/17 0416  11/15/17 0423 11/17/17 0507 11/18/17 0513  WBC 9.8 12.2* 9.9 7.3 6.2  NEUTROABS  --   --   --  4.7 3.8  HGB 10.3* 10.0* 10.1* 9.9* 10.0*  HCT 32.2* 29.9* 30.6* 30.8* 31.3*  MCV 87.0 86.2 86.0 87.0 88.2  PLT 368 358 357 359 888   Basic Metabolic Panel: Recent Labs  Lab 11/12/17 0400 11/13/17 0416  11/15/17 0423 11/17/17 0507 11/18/17 0513  NA 135 135 136 141 140  K 3.8 4.1 3.3* 3.3* 3.6  CL 107 108 109 110 110  CO2 17* 15* 20* 23 24  GLUCOSE 88 97 120* 143* 127*  BUN 12 13 14  21* 27*  CREATININE 0.91 1.03 0.83 0.79 0.74  CALCIUM 8.3* 8.3* 8.1* 8.5* 8.6*  MG  --   --   --  2.0 2.1  PHOS  --   --   --  1.9*  --    GFR: Estimated Creatinine Clearance: 57.6 mL/min (by C-G formula based on SCr of 0.74 mg/dL). Liver Function Tests: No results for input(s): AST, ALT, ALKPHOS, BILITOT, PROT, ALBUMIN in the last 168 hours. No results for input(s): LIPASE, AMYLASE in the last 168 hours. No results for input(s): AMMONIA in the last 168 hours. Coagulation Profile: No results for input(s): INR, PROTIME in the last 168 hours. Cardiac Enzymes: No results for input(s): CKTOTAL, CKMB, CKMBINDEX, TROPONINI in the last 168 hours. BNP (last 3 results) No results for input(s): PROBNP in the last 8760 hours. HbA1C: No results for input(s): HGBA1C in the last 72 hours. CBG: Recent Labs  Lab 11/17/17 2040 11/18/17 0117 11/18/17 0432 11/18/17 0737 11/18/17 1129  GLUCAP 116* 116* 121* 135* 114*   Lipid Profile: No results for input(s): CHOL, HDL, LDLCALC, TRIG, CHOLHDL, LDLDIRECT in the last 72 hours. Thyroid Function Tests: No results for input(s): TSH, T4TOTAL, FREET4, T3FREE, THYROIDAB in the last 72 hours. Anemia Panel: No results for input(s): VITAMINB12, FOLATE, FERRITIN, TIBC, IRON, RETICCTPCT in the last 72 hours. Sepsis Labs: No results for input(s): PROCALCITON, LATICACIDVEN in the last 168 hours.  Recent Results (from the past 240 hour(s))  Surgical pcr screen     Status: None   Collection Time: 11/11/17  1:18 PM  Result Value Ref Range Status   MRSA, PCR NEGATIVE NEGATIVE Final   Staphylococcus aureus NEGATIVE NEGATIVE Final    Comment: (NOTE) The Xpert SA Assay (FDA approved for NASAL specimens in patients 76 years of age and older), is one component of a  comprehensive surveillance program. It is not intended to diagnose infection nor to guide or monitor treatment.          Radiology Studies: No results found.      Scheduled Meds: . enoxaparin (LOVENOX) injection  40 mg Subcutaneous Q24H  . feeding supplement  1 Container Oral Q24H  . pantoprazole (PROTONIX) IV  40 mg Intravenous Q12H   Continuous Infusions: . feeding supplement (OSMOLITE 1.5 CAL)       LOS: 8 days        Aline August, MD [facility] Pager [phone]  If 7PM-7AM, please contact night-coverage [email or web address] Password [email or web address] 11/18/2017, 1:24 PM

## 2017-11-18 NOTE — Progress Notes (Signed)
Physical Therapy Treatment Patient Details Name: Lee Pratt MRN: [id number] DOB: 08-Mar-1934 Today's Date: 11/18/2017    History of Present Illness 83 yo male admitted with gastric outlet obstruction. Found to have gastric adenocarcinoma. S/P gastrojejunostomy, G tube placement, J tube placement 11/12/17    PT Comments    Progressing with mobility. Bed mobility improving although pt still requires HOB to be elevated. Will continue to follow.    Follow Up Recommendations  Home health PT;Supervision/Assistance - 24 hour (SNF no longer an option due to assist level and distance walked)     Equipment Recommendations  Hospital bed    Recommendations for Other Services OT consult     Precautions / Restrictions Precautions Precautions: Fall Precaution Comments: G tube, J tube Restrictions Weight Bearing Restrictions: No    Mobility  Bed Mobility Overal bed mobility: Needs Assistance Bed Mobility: Supine to Sit     Supine to sit: HOB elevated;Supervision     General bed mobility comments: for safety. Increased time. Still requires HOB to be elevated.  Transfers Overall transfer level: Needs assistance Equipment used: Rolling walker (2 wheeled) Transfers: Sit to/from Stand Sit to Stand: Min guard;From elevated surface         General transfer comment: close guard for safety. Multimodal cueing for safety, hand placement.   Ambulation/Gait Ambulation/Gait assistance: Min guard Ambulation Distance (Feet): 1000 Feet Assistive device: Rolling walker (2 wheeled) Gait Pattern/deviations: Step-through pattern;Decreased stride length     General Gait Details: close guard for safety. Some unsteadiness noted when pt removes hand from RW (wife face).    Stairs            Wheelchair Mobility    Modified Rankin (Stroke Patients Only)       Balance             Standing balance-Leahy Scale: Fair Standing balance comment: requiring RW currently. Pt is able to  release walker and maintain balance during static standing.                             Cognition Arousal/Alertness: Awake/alert Behavior During Therapy: WFL for tasks assessed/performed                                   General Comments: first language [place] however appropriate communication      Exercises      General Comments        Pertinent Vitals/Pain Pain Assessment: No/denies pain    Home Living                      Prior Function            PT Goals (current goals can now be found in the care plan section) Progress towards PT goals: Progressing toward goals    Frequency           PT Plan Current plan remains appropriate    Co-evaluation              AM-PAC PT "6 Clicks" Daily Activity  Outcome Measure  Difficulty turning over in bed (including adjusting bedclothes, sheets and blankets)?: A Little Difficulty moving from lying on back to sitting on the side of the bed? : A Little Difficulty sitting down on and standing up from a chair with arms (e.g., wheelchair, bedside commode, etc,.)?: A Little Help needed moving  to and from a bed to chair (including a wheelchair)?: A Little Help needed walking in hospital room?: A Little Help needed climbing 3-5 steps with a railing? : A Little 6 Click Score: 18    End of Session   Activity Tolerance: Patient tolerated treatment well Patient left: in chair;with call bell/phone within reach;with family/visitor present   PT Visit Diagnosis: Muscle weakness (generalized) (M62.81);Difficulty in walking, not elsewhere classified (R26.2)     Time: 5872-7618 PT Time Calculation (min) (ACUTE ONLY): 31 min  Charges:  $Gait Training: 23-37 mins                    G Codes:          Weston Anna, MPT Pager: [phone]

## 2017-11-18 NOTE — Telephone Encounter (Signed)
Appt has been scheduled for the Lee Pratt to see Dr. Burr Medico on 12/5 at 230pm. Lee Pratt is currently in the hospital. Letter mailed.

## 2017-11-19 ENCOUNTER — Encounter (HOSPITAL_COMMUNITY): Payer: Self-pay | Admitting: Radiology

## 2017-11-19 ENCOUNTER — Inpatient Hospital Stay (HOSPITAL_COMMUNITY): Payer: Medicare Other

## 2017-11-19 DIAGNOSIS — C786 Secondary malignant neoplasm of retroperitoneum and peritoneum: Secondary | ICD-10-CM

## 2017-11-19 DIAGNOSIS — R109 Unspecified abdominal pain: Secondary | ICD-10-CM

## 2017-11-19 DIAGNOSIS — R630 Anorexia: Secondary | ICD-10-CM

## 2017-11-19 DIAGNOSIS — C169 Malignant neoplasm of stomach, unspecified: Secondary | ICD-10-CM

## 2017-11-19 LAB — GLUCOSE, CAPILLARY
GLUCOSE-CAPILLARY: 105 mg/dL — AB (ref 65–99)
GLUCOSE-CAPILLARY: 135 mg/dL — AB (ref 65–99)
GLUCOSE-CAPILLARY: 143 mg/dL — AB (ref 65–99)
Glucose-Capillary: 125 mg/dL — ABNORMAL HIGH (ref 65–99)
Glucose-Capillary: 126 mg/dL — ABNORMAL HIGH (ref 65–99)
Glucose-Capillary: 107 mg/dL — ABNORMAL HIGH (ref 65–99)

## 2017-11-19 NOTE — Progress Notes (Signed)
Nutrition Brief Note  RD paged by RN regarding pt's TF and newly developed diarrhea.   Pt was receiving Osmolite 1.5 @ 65 ml/hr for 16 hours. TF now stopped.   RD does not suspect this is due to cycling the TF. Pt is actually receiving at 65 ml/hr x 16 hours less volume of tube feeding in total (1040 ml). Pt was tolerating Osmolite 1.5 @ 55 ml/hr x 24 hours yesterday (provided  1320 ml total) with no issue.  -Pt's G-tube clamped as of 11/19. -Noted that pt is now on Enteric precautions. RD will monitor frequency of BMs and for results of C.diff test.  -Pt had been consuming only liquids PTA and during this admission. Looser stools are to be expected. -If diarrhea persists, may need to consider anti-diarrheal.  Would continue with cycles of TF for 16 hours as recommended.  Clayton Bibles, MS, RD, [place] Dietitian Pager: [phone] After Hours Pager: [phone]

## 2017-11-19 NOTE — Progress Notes (Addendum)
Patient ID: Lee Pratt, male   DOB: March 22, 1934, 83 y.o.   MRN: [id number]  PROGRESS NOTE    Lee Pratt  QHU:[id number] DOB: 15-Jun-1934 DOA: 11/09/2017 PCP: [name], MD   Brief Narrative:  83 year old male presented from primary care providers office for evaluation of significant anemia weight loss.  In the ED, hemoglobin was 7.8; this can the abdomen showed circumferential wall thickening involving the gastric antrum suggestive of element of gastric outlet obstruction.  EGD showed large antral mass which  turned out to be adenocarcinoma.  Patient underwent upper endoscopy with biopsy and gastrojejunostomy.  Assessment & Plan:   Principal Problem:   Partial gastric outlet obstruction Active Problems:   HTN (hypertension)   Hyperlipidemia   Arrhythmia   Acute blood loss anemia   Abnormal CT scan, stomach   Nausea without vomiting   Abnormal loss of weight   Gastric tumor   Abnormal CT of the abdomen  Stage IV gastric adenocarcinoma - status post diagnostic laparoscopy with biopsy, gastrojejunostomy and G-tube and J-tube insertion by general surgery -General surgery following -outpatient follow up with Oncology at cancer center upon discharge.    Acute Blood loss anemia/GI bleed probably secondary to above -2 units packed red cells transfusion on 11/10/2017 -Hemoglobin stable.  Monitor  Moderate protein calorie malnutrition -Continue tube feedings per dietary recommendations.    Hypokalemia -Improved  Hypertension -Monitor.  Blood pressure stable  Physical deconditioning -Patient will need home care on discharge   DVT prophylaxis: SCDs Code Status: Full Family Communication: Spoke to wife present at bedside Disposition Plan: Home in 1-2 days once cleared by general surgery  Consultants: GI and general surgery Spoke to Dr. Magrinat/oncology on 11/16/2017 on phone  Procedures: EGD 11/10/2017  Diagnostic laparoscopy with biopsy, gastrojejunostomy, G tube placement and J  tube.  Antimicrobials: Preop antibiotics only   Subjective: Patient seen and examined at bedside.  No overnight fever or vomiting.  Patient is tolerating tube feeding.  Objective: Vitals:   11/18/17 0618 11/18/17 1338 11/18/17 2004 11/19/17 0402  BP: 130/77 117/68 115/71 115/76  Pulse: 79 84 87 83  Resp: 18 18 16 18   Temp: 98.7 F (37.1 C) 97.7 F (36.5 C) 98.4 F (36.9 C) 97.6 F (36.4 C)  TempSrc: Oral Oral Oral Oral  SpO2:  99% 99% 99%  Weight: 56.2 kg (123 lb 14.4 oz)   56.5 kg (124 lb 9.6 oz)  Height:        Intake/Output Summary (Last 24 hours) at 11/19/2017 1005 Last data filed at 11/19/2017 0600 Gross per 24 hour  Intake 752.67 ml  Output 325 ml  Net 427.67 ml   Filed Weights   11/17/17 0450 11/18/17 0618 11/19/17 0402  Weight: 53.8 kg (118 lb 9.7 oz) 56.2 kg (123 lb 14.4 oz) 56.5 kg (124 lb 9.6 oz)    Examination:  General exam: No acute distress; alert and awake respiratory system: Bilateral decreased breath sound at bases Cardiovascular system: S1 & S2 heard, rate controlled  gastrointestinal system: Abdomen is nondistended, soft with dressing present. Normal bowel sounds heard. Extremities: No cyanosis, clubbing, edema    Data Reviewed: I have personally reviewed following labs and imaging studies  CBC: Recent Labs  Lab 11/13/17 0416 11/15/17 0423 11/17/17 0507 11/18/17 0513  WBC 12.2* 9.9 7.3 6.2  NEUTROABS  --   --  4.7 3.8  HGB 10.0* 10.1* 9.9* 10.0*  HCT 29.9* 30.6* 30.8* 31.3*  MCV 86.2 86.0 87.0 88.2  PLT 358 357 359 359  Basic Metabolic Panel: Recent Labs  Lab 11/13/17 0416 11/15/17 0423 11/17/17 0507 11/18/17 0513  NA 135 136 141 140  K 4.1 3.3* 3.3* 3.6  CL 108 109 110 110  CO2 15* 20* 23 24  GLUCOSE 97 120* 143* 127*  BUN 13 14 21* 27*  CREATININE 1.03 0.83 0.79 0.74  CALCIUM 8.3* 8.1* 8.5* 8.6*  MG  --   --  2.0 2.1  PHOS  --   --  1.9*  --    GFR: Estimated Creatinine Clearance: 57.9 mL/min (by C-G formula based  on SCr of 0.74 mg/dL). Liver Function Tests: No results for input(s): AST, ALT, ALKPHOS, BILITOT, PROT, ALBUMIN in the last 168 hours. No results for input(s): LIPASE, AMYLASE in the last 168 hours. No results for input(s): AMMONIA in the last 168 hours. Coagulation Profile: No results for input(s): INR, PROTIME in the last 168 hours. Cardiac Enzymes: No results for input(s): CKTOTAL, CKMB, CKMBINDEX, TROPONINI in the last 168 hours. BNP (last 3 results) No results for input(s): PROBNP in the last 8760 hours. HbA1C: No results for input(s): HGBA1C in the last 72 hours. CBG: Recent Labs  Lab 11/18/17 1611 11/18/17 2004 11/19/17 0008 11/19/17 0353 11/19/17 0741  GLUCAP 113* 141* 135* 125* 126*   Lipid Profile: No results for input(s): CHOL, HDL, LDLCALC, TRIG, CHOLHDL, LDLDIRECT in the last 72 hours. Thyroid Function Tests: No results for input(s): TSH, T4TOTAL, FREET4, T3FREE, THYROIDAB in the last 72 hours. Anemia Panel: No results for input(s): VITAMINB12, FOLATE, FERRITIN, TIBC, IRON, RETICCTPCT in the last 72 hours. Sepsis Labs: No results for input(s): PROCALCITON, LATICACIDVEN in the last 168 hours.  Recent Results (from the past 240 hour(s))  Surgical pcr screen     Status: None   Collection Time: 11/11/17  1:18 PM  Result Value Ref Range Status   MRSA, PCR NEGATIVE NEGATIVE Final   Staphylococcus aureus NEGATIVE NEGATIVE Final    Comment: (NOTE) The Xpert SA Assay (FDA approved for NASAL specimens in patients 14 years of age and older), is one component of a comprehensive surveillance program. It is not intended to diagnose infection nor to guide or monitor treatment.          Radiology Studies: No results found.      Scheduled Meds: . enoxaparin (LOVENOX) injection  40 mg Subcutaneous Q24H  . feeding supplement  1 Container Oral Q24H  . pantoprazole (PROTONIX) IV  40 mg Intravenous Q12H   Continuous Infusions: . feeding supplement (OSMOLITE 1.5  CAL) 1,000 mL (11/18/17 2016)     LOS: 9 days        Aline August, MD [facility] Pager [phone]  If 7PM-7AM, please contact night-coverage [email or web address] Password [email or web address] 11/19/2017, 10:05 AM

## 2017-11-19 NOTE — Plan of Care (Signed)
  Health Behavior/Discharge Planning: Ability to manage health-related needs will improve 11/19/2017 2326 - Progressing by Ashley Murrain, RN   Clinical Measurements: Ability to maintain clinical measurements within normal limits will improve 11/19/2017 2326 - Progressing by Zaraya Delauder, Sherryll Burger, RN

## 2017-11-19 NOTE — Progress Notes (Signed)
7 Days Post-Op   Subjective/Chief Complaint: Pt with no nausea with Gtube clamped.  tol J-TFs at goal.   Objective: Vital signs in last 24 hours: Temp:  [97.6 F (36.4 C)-98.4 F (36.9 C)] 97.6 F (36.4 C) (11/20 0402) Pulse Rate:  [83-87] 83 (11/20 0402) Resp:  [16-18] 18 (11/20 0402) BP: (115-117)/(68-76) 115/76 (11/20 0402) SpO2:  [99 %] 99 % (11/20 0402) Weight:  [56.5 kg (124 lb 9.6 oz)] 56.5 kg (124 lb 9.6 oz) (11/20 0402) Last BM Date: 11/18/17  Intake/Output from previous day: 11/19 0701 - 11/20 0700 In: 1112.7 [P.O.:480; NG/GT:632.7] Out: 325 [Drains:325] Intake/Output this shift: No intake/output data recorded.  General appearance: alert and cooperative GI: soft, non-tender; bowel sounds normal; no masses,  no organomegaly and G/J-tube in place  Lab Results:  Recent Labs    11/17/17 0507 11/18/17 0513  WBC 7.3 6.2  HGB 9.9* 10.0*  HCT 30.8* 31.3*  PLT 359 359   BMET Recent Labs    11/17/17 0507 11/18/17 0513  NA 141 140  K 3.3* 3.6  CL 110 110  CO2 23 24  GLUCOSE 143* 127*  BUN 21* 27*  CREATININE 0.79 0.74  CALCIUM 8.5* 8.6*   Anti-infectives: Anti-infectives (From admission, onward)   Start     Dose/Rate Route Frequency Ordered Stop   11/12/17 1230  ceFAZolin (ANCEF) IVPB 2g/100 mL premix     2 g 200 mL/hr over 30 Minutes Intravenous On call to O.R. 11/11/17 1055 11/12/17 1026   11/12/17 0913  ceFAZolin (ANCEF) 2-4 GM/100ML-% IVPB    Comments:  Bridget Hartshorn   : cabinet override      11/12/17 0913 11/12/17 1026   11/11/17 1130  ceFAZolin (ANCEF) IVPB 2g/100 mL premix  Status:  Discontinued     2 g 200 mL/hr over 30 Minutes Intravenous On call to O.R. 11/11/17 1045 11/11/17 1056      Assessment/Plan: s/p Procedure(s): Diagnostic Laparoscopy with biopsy, gastojejunostomy, insertion of G-tube and J-tube (N/A) Con't Jtube feeds Pt in need of PAC.  Will plan on placing this Wed. Post op if con't to do well likely OK for home Wed  PM. Will need Gtube education for flushing tube daily. I discussed with the pt the risks and benefits of the PAC procedure to include but not limited to: infection, bleeding, damage to surrounding structures, and possible PTX.  Pt voiced understanding and wishes to proceed.   LOS: 9 days    Rosario Jacks., [facility] 11/19/2017

## 2017-11-19 NOTE — Progress Notes (Signed)
[place]  Telephone:([phone]) [phone]   [place]  DOB: 09-03-1934  MR#: [id number]  CSN#: [id number]    Requesting Physician: [facility]  Patient Care Team: [name], MD as PCP - General (Internal Medicine)  Reason for consult: Newly diagnosed gastric cancer with peritoneal metastasis  History of present illness:    Lee Pratt presents in the hospital due to symptomatic anemia and abdominal pain. He reports that he initially had symptoms of decreased appetite, weight loss of 10-20 lbs, and intermittent abdominal pain. Wife reports the patient having emesis. He reports that his energy levels in the past couple of months has declined. Wife reports that the patient has had generalized weakness. He notes that for 2 months he has had these symptoms and that prior to that his energy level was good and he used to walk everyday. He denies doing anything else besides walking prior to his recent diagnosis. Wife reports that the patient has a hx of HTN and only takes ASA. He denies MI or any other medical issues. 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 He has two children who live in [place]. Wife states that they moved to [place] due to the cold weather and they do not know any family members locally. Wife reports that they go to church, but they do not drive.   He had a CT AP completed on 11/09/2017 with results of: IMPRESSION: 1. Circumferential wall thickening involving the gastric antrum with marked distension of the stomach suggestive of an element of gastric outlet obstruction. Further evaluation with endoscopy is recommended. 2. Short-segment occlusion involving the left lower lobe lateral basal subsegmental bronchus, nonspecific though could be indicative of aspiration in the setting of suspected gastric outlet obstruction. Clinical correlation is advised. 3. Small amount of fluid seen with the pelvic  cul-de-sac, presumably reactive. 4. Mild ectasia of the abdominal aorta measuring 2.7 cm in diameter. Recommend follow-up aortic ultrasound in 5 years. This recommendation follows ACR consensus guidelines: White Paper of the ACR Incidental Findings Committee II on Vascular Findings. J Am Coll Radiol 2013; 76:226-333. 5. Aortic Atherosclerosis (ICD10-I70.0). Suspected hemodynamically significant stenoses involving the bilateral external iliac arteries and the right common femoral artery. Correlation for lower extremity PAD symptoms is recommended. Further evaluation with the acquisition of ABIs could be performed as indicated.  He was evaluated in [facility] and admitted for abdominal pain, anemia, and partial gastric outlet obstruction on 11/09/2017. He also had weight loss and dark colored stool during his ED visit on 11/09/2017 prior to being admitted to the hospital. He had an EGD completed on 11/10/2017 which was performed by Dr. Zenovia Jarred with pathology results showing: adenocarcinoma of gastric mass. Subsequently, he had a diagnostic larproscopic with biopsy, gastrojejunostomy, insertion of G and J tube performed by Dr. [name] on 54/56/2563 with pathology results of peritoneum, biopsy with metastatic adenocarcinoma. During his admission, he has received IV feraheme twice on 11/12/2017 and 11/15/2017 due to symptomatic anemia.    On review of systems, he reports decreased appetite, decreased energy levels, weight loss (10-20 lbs), and intermittent abdominal pain. He denies any other symptoms at this time.     Due to language barrier, an interpreter ([place]) was present via video during the history-taking and subsequent discussion (and for part of the physical exam) with this patient.   MEDICAL HISTORY:  Past Medical History:  Diagnosis Date  . Hyperglycemia 03/16/2013  .  Hypertension     SURGICAL HISTORY: Past Surgical History:  Procedure Laterality Date  . ESOPHAGOGASTRODUODENOSCOPY N/A  11/10/2017   Procedure: ESOPHAGOGASTRODUODENOSCOPY (EGD);  Surgeon: Jerene Bears, MD;  Location: [facility];  Service: Gastroenterology;  Laterality: N/A;  . GASTROJEJUNOSTOMY N/A 11/12/2017   Procedure: Diagnostic Laparoscopy with biopsy, gastojejunostomy, insertion of G-tube and J-tube;  Surgeon: [name], MD;  Location: [facility];  Service: General;  Laterality: N/A;  . NO PAST SURGERIES  12/21/2011    SOCIAL HISTORY: Social History   Socioeconomic History  . Marital status: Married    Spouse name: Not on file  . Number of children: Not on file  . Years of education: Not on file  . Highest education level: Not on file  Social Needs  . Financial resource strain: Not on file  . Food insecurity - worry: Not on file  . Food insecurity - inability: Not on file  . Transportation needs - medical: Not on file  . Transportation needs - non-medical: Not on file  Occupational History  . Not on file  Tobacco Use  . Smoking status: Never Smoker  . Smokeless tobacco: Never Used  Substance and Sexual Activity  . Alcohol use: No  . Drug use: No  . Sexual activity: Not Currently  Other Topics Concern  . Not on file  Social History Narrative  . Not on file    FAMILY HISTORY: History reviewed. No pertinent family history.  ALLERGIES:  has No Known Allergies.  MEDICATIONS:  Current Facility-Administered Medications  Medication Dose Route Frequency Provider Last Rate Last Dose  . enoxaparin (LOVENOX) injection 40 mg  40 mg Subcutaneous Z61W [name], MD   40 mg at 11/19/17 0955  . feeding supplement (BOOST / RESOURCE BREEZE) liquid 1 Container  1 Container Oral Q24H Barton Dubois, MD   1 Container at 11/16/17 [phone]  . feeding supplement (OSMOLITE 1.5 CAL) liquid 1,000 mL  1,000 mL Per Tube Continuous Aline August, MD   Stopped at 11/19/17 1100  . morphine 4 MG/ML injection 2-4 mg  2-4 mg Intravenous V4U PRN [name], MD   4 mg at 98/11/91 0854  . MUSCLE RUB CREA 1  application  1 application Topical PRN Donne Hazel, MD   1 application at 47/82/95 2020  . ondansetron (ZOFRAN) tablet 4 mg  4 mg Oral Q6H PRN Donne Hazel, MD       Or  . ondansetron [facility]) injection 4 mg  4 mg Intravenous Q6H PRN Donne Hazel, MD      . oxyCODONE (ROXICODONE) 5 MG/5ML solution 5 mg  5 mg Per Tube A2Z PRN [name], MD      . pantoprazole (PROTONIX) injection 40 mg  40 mg Intravenous Q12H Donne Hazel, MD   40 mg at 11/19/17 0956  . polyvinyl alcohol (LIQUIFILM TEARS) 1.4 % ophthalmic solution 1 drop  1 drop Both Eyes PRN Donne Hazel, MD   1 drop at 11/09/17 2015    REVIEW OF SYSTEMS:   Constitutional: Denies fevers, chills or abnormal night sweats (+) weight loss (10-20 lbs), (+) decreased appetite, (+) decreased energy levels.  Eyes: Denies blurriness of vision, double vision or watery eyes Ears, nose, mouth, throat, and face: Denies mucositis or sore throat Respiratory: Denies cough, dyspnea or wheezes Cardiovascular: Denies palpitation, chest discomfort or lower extremity swelling Gastrointestinal:  (+) nausea. Denies heartburn or change in bowel habits. (+) intermittent abdominal pain Skin: Denies abnormal skin rashes Lymphatics: Denies  new lymphadenopathy or easy bruising Neurological:Denies numbness, tingling or new weaknesses Behavioral/Psych: Mood is stable, no new changes  All other systems were reviewed with the patient and are negative.  PHYSICAL EXAMINATION: ECOG PERFORMANCE STATUS: 3 - Symptomatic, >50% confined to bed  Vitals:   11/18/17 2004 11/19/17 0402  BP: 115/71 115/76  Pulse: 87 83  Resp: 16 18  Temp: 98.4 F (36.9 C) 97.6 F (36.4 C)  SpO2: 99% 99%   Filed Weights   11/17/17 0450 11/18/17 0618 11/19/17 0402  Weight: 118 lb 9.7 oz (53.8 kg) 123 lb 14.4 oz (56.2 kg) 124 lb 9.6 oz (56.5 kg)    GENERAL:alert, no distress and comfortable SKIN: skin color, texture, turgor are normal, no rashes or significant  lesions EYES: normal, conjunctiva are pink and non-injected, sclera clear OROPHARYNX:no exudate, no erythema and lips, buccal mucosa, and tongue normal  NECK: supple, thyroid normal size, non-tender, without nodularity LYMPH:  no palpable lymphadenopathy in the cervical, axillary or inguinal LUNGS: clear to auscultation and percussion with normal breathing effort HEART: regular rate & rhythm and no murmurs and no lower extremity edema ABDOMEN:abdomen soft, non-tender and normal bowel sounds, (+) PEG and J tube in LUQ  Musculoskeletal:no cyanosis of digits and no clubbing  PSYCH: alert & oriented x 3 with fluent speech NEURO: no focal motor/sensory deficits  LABORATORY DATA:  I have reviewed the data as listed Lab Results  Component Value Date   WBC 6.2 11/18/2017   HGB 10.0 (L) 11/18/2017   HCT 31.3 (L) 11/18/2017   MCV 88.2 11/18/2017   PLT 359 11/18/2017   Recent Labs    11/08/17 1656  11/09/17 0931 11/10/17 0504  11/15/17 0423 11/17/17 0507 11/18/17 0513  NA 134   < > 134* 137   < > 136 141 140  K 4.0  --  3.4* 3.8   < > 3.3* 3.3* 3.6  CL 99  --  102 106   < > 109 110 110  CO2 18*  --  23 21*   < > 20* 23 24  GLUCOSE 125*   < > 113* 86   < > 120* 143* 127*  BUN 22   < > 20 18   < > 14 21* 27*  CREATININE 1.04  --  1.05 0.92   < > 0.83 0.79 0.74  CALCIUM 9.0  --  8.6* 8.1*   < > 8.1* 8.5* 8.6*  GFRNONAA 67  --  >60 >60   < > >60 >60 >60  GFRAA 77  --  >60 >60   < > >60 >60 >60  PROT 6.6  --  6.6 5.5*  --   --   --   --   ALBUMIN 4.0  --  3.2* 2.7*  --   --   --   --   AST 23  --  28 20  --   --   --   --   ALT 13  --  16* 14*  --   --   --   --   ALKPHOS 70  --  56 49  --   --   --   --   BILITOT 0.3  --  0.5 0.8  --   --   --   --    < > = values in this interval not displayed.    RADIOGRAPHIC STUDIES: I have personally reviewed the radiological images as listed and agreed with the findings in the  report. Ct Abdomen Pelvis W Contrast  Result Date:  11/09/2017 CLINICAL DATA:  Hypotension and anemia. EXAM: CT ABDOMEN AND PELVIS WITH CONTRAST TECHNIQUE: Multidetector CT imaging of the abdomen and pelvis was performed using the standard protocol following bolus administration of intravenous contrast. CONTRAST:  100 cc Isovue 300 COMPARISON:  None. FINDINGS: Lower chest: Limited visualization of the lower thorax demonstrates short-segment occlusion of left lower lobe lateral basal bronchi (image 19, series 7, coronal image 64, series 4). Minimal dependent atelectasis/scar. Punctate granuloma within the right lower lobe. No pleural effusion. Normal heart size. Coronary artery calcifications. Calcifications within the mitral valve leaflets. No pericardial effusion. Hepatobiliary: Normal hepatic contour. No discrete hepatic lesions. No intra extrahepatic biliary duct dilatation. No ascites. Pancreas: Normal appearance of the pancreas Spleen: Normal appearance of the spleen Adrenals/Urinary Tract: There is symmetric enhancement and excretion of the bilateral kidneys. Note is made of a small right-sided extrarenal pelvis. No definite renal stones on this postcontrast examination. No urinary obstruction or perinephric stranding. Normal appearance the urinary bladder given underdistention. Normal appearance of the bilateral adrenal glands. Stomach/Bowel: There is circumferential wall thickening involving the gastric antrum (axial image 34, series 2, coronal image 43, series 4) with marked fluid distention of the stomach. The sigmoid colon is noted to be redundant. The hepatic flexure of the colon is noted to be interposed adjacent to the anterolateral aspect of the liver. The bowel is otherwise normal in course and caliber without wall thickening or evidence of enteric obstruction. Normal appearance of the terminal ileum and appendix. No pneumoperitoneum, pneumatosis or portal venous gas. Vascular/Lymphatic: There is a large amount of mixed calcified and noncalcified  irregular atherosclerotic plaque throughout the abdominal aorta. The abdominal aorta is noted to be mildly ectatic measuring approximately 2.7 cm in greatest diameter. The IMA is not definitely identified. Irregular calcified plaque involves the origin of all the major branch vessels of the abdominal aorta however the major branch vessels appear patent. Suspected hemodynamically significant narrowings involving the bilateral external iliac arteries as well as the right common femoral artery (image 81, series 2). Reproductive: Normal sized prostate gland. Small amount of free fluid in the pelvic cul-de-sac. Other: Regional soft tissues appear normal. Musculoskeletal: No acute or aggressive osseous abnormalities. Stigmata of DISH within the lower thoracic spine. IMPRESSION: 1. Circumferential wall thickening involving the gastric antrum with marked distension of the stomach suggestive of an element of gastric outlet obstruction. Further evaluation with endoscopy is recommended. 2. Short-segment occlusion involving the left lower lobe lateral basal subsegmental bronchus, nonspecific though could be indicative of aspiration in the setting of suspected gastric outlet obstruction. Clinical correlation is advised. 3. Small amount of fluid seen with the pelvic cul-de-sac, presumably reactive. 4. Mild ectasia of the abdominal aorta measuring 2.7 cm in diameter. Recommend follow-up aortic ultrasound in 5 years. This recommendation follows ACR consensus guidelines: White Paper of the ACR Incidental Findings Committee II on Vascular Findings. J Am Coll Radiol 2013; 76:283-151. 5. Aortic Atherosclerosis (ICD10-I70.0). Suspected hemodynamically significant stenoses involving the bilateral external iliac arteries and the right common femoral artery. Correlation for lower extremity PAD symptoms is recommended. Further evaluation with the acquisition of ABIs could be performed as indicated. Electronically Signed   By: Sandi Mariscal  M.D.   On: 11/09/2017 12:22   Dg Abd Acute W/chest  Result Date: 11/08/2017 CLINICAL DATA:  Epigastric abdominal pain, vomiting and weight loss. EXAM: DG ABDOMEN ACUTE W/ 1V CHEST COMPARISON:  08/23/2015 chest radiograph. FINDINGS: Stable cardiomediastinal silhouette  with normal heart size and aortic atherosclerosis. No pneumothorax. No pleural effusion. Stable right apical pleural-parenchymal scarring and calcification. Stable scattered granulomas at the periphery of the right lung. No pulmonary edema. No acute consolidative airspace disease. No dilated small bowel loops or significant air-fluid levels. Minimal stool and gas in the large bowel. No evidence of pneumatosis or pneumoperitoneum. No radiopaque nephrolithiasis. Extensive iliofemoral atherosclerosis. Moderate lumbar spondylosis. IMPRESSION: 1. No active cardiopulmonary disease. 2. Nonobstructive bowel gas pattern. Electronically Signed   By: Ilona Sorrel M.D.   On: 11/08/2017 15:54    ASSESSMENT & PLAN:  83 y.o. male with PMH of hypertension, but otherwise healthy, a retired Probation officer, presented with abdominal pain, nausea, anorexia and weight loss.  1.  Gastric adenocarcinoma with peritoneal metastasis, stage IV 2.  Gastric outlet syndrome from cancer, status post gastro jejunostomy and PEG placement  3.  Severe malnutrition, status post J-tube placement for nutrition 4.  Severe iron deficient anemia, improved, status post blood transfusion and IV Feraheme X2 5. Hypertension   Recommendations:  -through the video interpreter, I discussed his CT scan findings, surgical findings, and biopsy results with patient and his wife in details.  Unfortunately the diagnostic laparoscope showed peritoneal metastasis, and a biopsy confirmed.  -We discussed his cancer is not curable at this stage, the goal of therapy is put on his life and improve his quality of life -We discussed role of palliative chemotherapy to control his disease.   Although he is 64, he had good baseline health,  was a Licensed conveyancer, he may be able to tolerate 5-FU, or FOLFOX if his nutrition status improves.  The goal of therapy is padded.  After lengthy discussion, patient expressed his interest to try chemotherapy. -We will get a CT chest without contrast to complete a staging -port placement tomorrow, if the patient considers chemotherapy as an option of treatment.  -Follow up in the office in a couple of weeks (scheduled for 12/5)   -Discussed that it is important for the patient to maintain nutrition and become active once discharged -will refer the patient to nutritionist for aid with maintaining nutrition.   All questions were answered. The patient knows to call the clinic with any problems, questions or concerns.    [name], MD 11/19/2017 1:10 PM   This document serves as a record of services personally performed by [name], MD. It was created on her behalf by Steva Colder, a trained medical scribe. The creation of this record is based on the scribe's personal observations and the provider's statements to them.   I have reviewed the above documentation for accuracy and completeness, and I agree with the above.

## 2017-11-20 ENCOUNTER — Encounter (HOSPITAL_COMMUNITY)
Admission: EM | Disposition: A | Discharge status: Home Health | Payer: Self-pay | Source: Home / Self Care | Attending: Internal Medicine

## 2017-11-20 ENCOUNTER — Inpatient Hospital Stay (HOSPITAL_COMMUNITY): Payer: Medicare Other

## 2017-11-20 ENCOUNTER — Inpatient Hospital Stay (HOSPITAL_COMMUNITY): Payer: Medicare Other | Admitting: Anesthesiology

## 2017-11-20 ENCOUNTER — Encounter (HOSPITAL_COMMUNITY): Payer: Self-pay | Admitting: General Surgery

## 2017-11-20 HISTORY — PX: PORTACATH PLACEMENT: SHX2246

## 2017-11-20 LAB — CBC WITH DIFFERENTIAL/PLATELET
BASOS PCT: 0 %
Basophils Absolute: 0 10*3/uL (ref 0.0–0.1)
EOS ABS: 0.5 10*3/uL (ref 0.0–0.7)
Eosinophils Relative: 7 %
HCT: 33.1 % — ABNORMAL LOW (ref 39.0–52.0)
HEMOGLOBIN: 10.6 g/dL — AB (ref 13.0–17.0)
Lymphocytes Relative: 22 %
Lymphs Abs: 1.7 10*3/uL (ref 0.7–4.0)
MCH: 28.4 pg (ref 26.0–34.0)
MCHC: 32 g/dL (ref 30.0–36.0)
MCV: 88.7 fL (ref 78.0–100.0)
MONOS PCT: 9 %
Monocytes Absolute: 0.7 10*3/uL (ref 0.1–1.0)
NEUTROS PCT: 62 %
Neutro Abs: 4.9 10*3/uL (ref 1.7–7.7)
Platelets: 400 10*3/uL (ref 150–400)
RBC: 3.73 MIL/uL — ABNORMAL LOW (ref 4.22–5.81)
RDW: 17 % — AB (ref 11.5–15.5)
WBC: 7.8 10*3/uL (ref 4.0–10.5)

## 2017-11-20 LAB — BASIC METABOLIC PANEL
Anion gap: 8 (ref 5–15)
BUN: 41 mg/dL — AB (ref 6–20)
CALCIUM: 8.9 mg/dL (ref 8.9–10.3)
CHLORIDE: 106 mmol/L (ref 101–111)
CO2: 26 mmol/L (ref 22–32)
Creatinine, Ser: 0.88 mg/dL (ref 0.61–1.24)
GFR calc non Af Amer: >60 mL/min (ref >60)
GLUCOSE: 112 mg/dL — AB (ref 65–99)
Potassium: 3.9 mmol/L (ref 3.5–5.1)
SODIUM: 140 mmol/L (ref 135–145)

## 2017-11-20 LAB — GLUCOSE, CAPILLARY
GLUCOSE-CAPILLARY: 137 mg/dL — AB (ref 65–99)
Glucose-Capillary: 104 mg/dL — ABNORMAL HIGH (ref 65–99)
Glucose-Capillary: 111 mg/dL — ABNORMAL HIGH (ref 65–99)
Glucose-Capillary: 128 mg/dL — ABNORMAL HIGH (ref 65–99)

## 2017-11-20 LAB — MAGNESIUM: Magnesium: 2.3 mg/dL (ref 1.7–2.4)

## 2017-11-20 SURGERY — INSERTION, TUNNELED CENTRAL VENOUS DEVICE, WITH PORT
Anesthesia: General | Site: Chest | Laterality: Left

## 2017-11-20 MED ORDER — OSMOLITE 1.5 CAL PO LIQD
1000.0000 mL | ORAL | Status: DC
  Filled 2017-11-20 (×2): qty 1000

## 2017-11-20 MED ORDER — DEXAMETHASONE SODIUM PHOSPHATE 10 MG/ML IJ SOLN
INTRAMUSCULAR | Status: DC | PRN
  Administered 2017-11-20: 10 mg via INTRAVENOUS

## 2017-11-20 MED ORDER — PROPOFOL 10 MG/ML IV BOLUS
INTRAVENOUS | Status: AC
  Filled 2017-11-20: qty 20

## 2017-11-20 MED ORDER — HEPARIN SOD (PORK) LOCK FLUSH 100 UNIT/ML IV SOLN
INTRAVENOUS | Status: AC
  Filled 2017-11-20: qty 5

## 2017-11-20 MED ORDER — ONDANSETRON HCL 4 MG/2ML IJ SOLN: 4.0000 mg | Freq: Once | INTRAMUSCULAR | Status: DC | PRN

## 2017-11-20 MED ORDER — FENTANYL CITRATE (PF) 100 MCG/2ML IJ SOLN: 25.0000 ug | INTRAMUSCULAR | Status: DC | PRN

## 2017-11-20 MED ORDER — BUPIVACAINE-EPINEPHRINE (PF) 0.25% -1:200000 IJ SOLN
INTRAMUSCULAR | Status: AC
  Filled 2017-11-20: qty 30

## 2017-11-20 MED ORDER — LIDOCAINE HCL (CARDIAC) 20 MG/ML IV SOLN
INTRAVENOUS | Status: DC | PRN
  Administered 2017-11-20: 50 mg via INTRAVENOUS

## 2017-11-20 MED ORDER — FENTANYL CITRATE (PF) 100 MCG/2ML IJ SOLN
INTRAMUSCULAR | Status: DC | PRN
  Administered 2017-11-20: 50 ug via INTRAVENOUS

## 2017-11-20 MED ORDER — PROPOFOL 10 MG/ML IV BOLUS
INTRAVENOUS | Status: DC | PRN
  Administered 2017-11-20: 100 mg via INTRAVENOUS

## 2017-11-20 MED ORDER — CEFAZOLIN SODIUM-DEXTROSE 2-4 GM/100ML-% IV SOLN
INTRAVENOUS | Status: AC
  Filled 2017-11-20: qty 100

## 2017-11-20 MED ORDER — SODIUM CHLORIDE 0.9 % IV SOLN
Freq: Once | INTRAVENOUS | Status: DC
  Filled 2017-11-20: qty 1.2

## 2017-11-20 MED ORDER — BUPIVACAINE-EPINEPHRINE 0.25% -1:200000 IJ SOLN
INTRAMUSCULAR | Status: DC | PRN
  Administered 2017-11-20: 10 mL

## 2017-11-20 MED ORDER — CEFAZOLIN SODIUM-DEXTROSE 2-4 GM/100ML-% IV SOLN: 2.0000 g | Freq: Once | INTRAVENOUS | Status: DC

## 2017-11-20 MED ORDER — ONDANSETRON HCL 4 MG/2ML IJ SOLN
INTRAMUSCULAR | Status: DC | PRN
  Administered 2017-11-20: 4 mg via INTRAVENOUS

## 2017-11-20 MED ORDER — SUCCINYLCHOLINE CHLORIDE 20 MG/ML IJ SOLN
INTRAMUSCULAR | Status: DC | PRN
  Administered 2017-11-20: 100 mg via INTRAVENOUS

## 2017-11-20 MED ORDER — LACTATED RINGERS IV SOLN
INTRAVENOUS | Status: DC
  Administered 2017-11-20: 11:00:00 via INTRAVENOUS

## 2017-11-20 MED ORDER — ONDANSETRON HCL 4 MG/2ML IJ SOLN
INTRAMUSCULAR | Status: AC
  Filled 2017-11-20: qty 2

## 2017-11-20 MED ORDER — FENTANYL CITRATE (PF) 100 MCG/2ML IJ SOLN
INTRAMUSCULAR | Status: AC
  Filled 2017-11-20: qty 2

## 2017-11-20 SURGICAL SUPPLY — 27 items
BAG DECANTER FOR FLEXI CONT (MISCELLANEOUS) ×3 IMPLANT
BLADE SURG 15 STRL LF DISP TIS (BLADE) ×1 IMPLANT
BLADE SURG 15 STRL SS (BLADE) ×2
CHLORAPREP W/TINT 26ML (MISCELLANEOUS) ×3 IMPLANT
DECANTER SPIKE VIAL GLASS SM (MISCELLANEOUS) ×3 IMPLANT
DERMABOND ADVANCED (GAUZE/BANDAGES/DRESSINGS) ×2
DERMABOND ADVANCED .7 DNX12 (GAUZE/BANDAGES/DRESSINGS) ×1 IMPLANT
DRAPE C-ARM 42X120 X-RAY (DRAPES) ×3 IMPLANT
DRAPE LAPAROTOMY TRNSV 102X78 (DRAPE) ×3 IMPLANT
ELECT PENCIL ROCKER SW 15FT (MISCELLANEOUS) ×3 IMPLANT
ELECT REM PT RETURN 15FT ADLT (MISCELLANEOUS) ×3 IMPLANT
GAUZE SPONGE 4X4 16PLY XRAY LF (GAUZE/BANDAGES/DRESSINGS) ×3 IMPLANT
GLOVE BIO SURGEON STRL SZ7.5 (GLOVE) ×3 IMPLANT
GOWN STRL REUS W/TWL XL LVL3 (GOWN DISPOSABLE) ×6 IMPLANT
KIT BASIN OR (CUSTOM PROCEDURE TRAY) ×3 IMPLANT
KIT PORT POWER 8FR ISP CVUE (Miscellaneous) ×3 IMPLANT
NEEDLE HYPO 22GX1.5 SAFETY (NEEDLE) ×3 IMPLANT
PACK BASIC VI WITH GOWN DISP (CUSTOM PROCEDURE TRAY) ×3 IMPLANT
SUT MNCRL AB 4-0 PS2 18 (SUTURE) ×3 IMPLANT
SUT PROLENE 2 0 SH DA (SUTURE) ×3 IMPLANT
SUT SILK 2 0 SH (SUTURE) ×3 IMPLANT
SUT VIC AB 3-0 SH 27 (SUTURE) ×2
SUT VIC AB 3-0 SH 27XBRD (SUTURE) ×1 IMPLANT
SYR 10ML LL (SYRINGE) ×3 IMPLANT
SYR 20CC LL (SYRINGE) ×3 IMPLANT
TOWEL OR 17X26 10 PK STRL BLUE (TOWEL DISPOSABLE) ×3 IMPLANT
TOWEL OR NON WOVEN STRL DISP B (DISPOSABLE) ×3 IMPLANT

## 2017-11-20 NOTE — Progress Notes (Signed)
Physical Therapy Treatment Patient Details Name: Lee Pratt MRN: [id number] DOB: 1934/11/15 Today's Date: 11/20/2017    History of Present Illness 83 yo male admitted with gastric outlet obstruction. Found to have gastric adenocarcinoma. S/P gastrojejunostomy, G tube placement, J tube placement 11/12/17    PT Comments    Pt has met PT goals, will sign off. He ambulated 600' with RW with verbal cues for posture, no loss of balance. He is independent with sit to stand transfers. From PT standpoint, he is ready to DC home, no f/u PT indicated.   Follow Up Recommendations  No PT follow up     Equipment Recommendations  Hospital bed    Recommendations for Other Services OT consult     Precautions / Restrictions Precautions Precautions: Fall Precaution Comments: G tube, J tube Restrictions Weight Bearing Restrictions: No    Mobility  Bed Mobility               General bed mobility comments: up on EOB  Transfers Overall transfer level: Needs assistance Equipment used: Rolling walker (2 wheeled) Transfers: Sit to/from Stand Sit to Stand: Modified independent (Device/Increase time)         General transfer comment: good hand placement  Ambulation/Gait Ambulation/Gait assistance: Supervision Ambulation Distance (Feet): 600 Feet Assistive device: Rolling walker (2 wheeled) Gait Pattern/deviations: Step-through pattern;Decreased stride length;Trunk flexed     General Gait Details: VCs for posture, pt steady with no loss of balance   Stairs            Wheelchair Mobility    Modified Rankin (Stroke Patients Only)       Balance             Standing balance-Leahy Scale: Fair Standing balance comment: requiring RW currently. Pt is able to release walker and maintain balance during static standing.                             Cognition Arousal/Alertness: Awake/alert Behavior During Therapy: WFL for tasks assessed/performed                                    General Comments: first language [place] however appropriate communication      Exercises      General Comments        Pertinent Vitals/Pain Pain Assessment: No/denies pain    Home Living                      Prior Function            PT Goals (current goals can now be found in the care plan section) Acute Rehab PT Goals Patient Stated Goal: home. regain independence PT Goal Formulation: All assessment and education complete, DC therapy Time For Goal Achievement: 11/27/17 Potential to Achieve Goals: Good Progress towards PT goals: Goals met/education completed, patient discharged from PT    Frequency           PT Plan Current plan remains appropriate    Co-evaluation              AM-PAC PT "6 Clicks" Daily Activity  Outcome Measure  Difficulty turning over in bed (including adjusting bedclothes, sheets and blankets)?: None Difficulty moving from lying on back to sitting on the side of the bed? : None Difficulty sitting down on and standing up from a chair  with arms (e.g., wheelchair, bedside commode, etc,.)?: None Help needed moving to and from a bed to chair (including a wheelchair)?: None Help needed walking in hospital room?: None Help needed climbing 3-5 steps with a railing? : A Little 6 Click Score: 23    End of Session   Activity Tolerance: Patient tolerated treatment well Patient left: with call bell/phone within reach;with family/visitor present;with nursing/sitter in room(in bathroom, RN in room) Nurse Communication: Mobility status PT Visit Diagnosis: Muscle weakness (generalized) (M62.81);Difficulty in walking, not elsewhere classified (R26.2)     Time: 1036-1050 PT Time Calculation (min) (ACUTE ONLY): 14 min  Charges:  $Gait Training: 8-22 mins                    G Codes:         Philomena Doheny 11/20/2017, 10:58 AM [phone]

## 2017-11-20 NOTE — Discharge Instructions (Signed)
It's ok to get the port site incision wet in the shower, just don't soak/submerge it for the next week. You should not submerge the abdomen in water with the tube sites.

## 2017-11-20 NOTE — Care Management Note (Signed)
Case Management Note  Patient Details  Name: Lee Pratt MRN: [id number] Date of Birth: March 03, 1934  Subjective/Objective: [facility] rep Santiago Glad aware of d/c today-home w/HHC-RN/PT/OT/aide/social worker,tube feeds, supplies, hospital bed. TF, hospital bed to be delivered to home prior patient d/c-[facility] will start teaching of TF in hospital, then have home nurse for ongoing teaching-[facility] will determine how many visits needed. Interpreter needed-[facility] is aware. Ambulance transp needed-[facility] will transport home once all dme has arrived in home-Nsg will manage calling [facility]. CM has put all [facility] forms in shadow chart.                   Action/Plan:d/c home w/HHC/dme/[facility].   Expected Discharge Date:  11/13/17               Expected Discharge Plan:  [place]  In-House Referral:  Clinical Social Work  Discharge planning Services  CM Consult  Post Acute Care Choice:  Home Health Choice offered to:  Adult Children  DME Arranged:  Hospital bed, Tube feeding DME Agency:  [place]:  RN, PT, OT, Nurse's Aide, Social Work [employer] Agency:  [place]  Status of Service:  Completed, signed off  If discussed at [employer], dates discussed:    Additional Comments:  Dessa Phi, RN 11/20/2017, 2:37 PM

## 2017-11-20 NOTE — Anesthesia Postprocedure Evaluation (Signed)
Anesthesia Post Note  Patient: Lee Pratt  Procedure(s) Performed: INSERTION PORT-A-CATH (Left Chest)     Patient location during evaluation: PACU Anesthesia Type: General Level of consciousness: awake and alert Pain management: pain level controlled Vital Signs Assessment: post-procedure vital signs reviewed and stable Respiratory status: spontaneous breathing, nonlabored ventilation, respiratory function stable and patient connected to nasal cannula oxygen Cardiovascular status: blood pressure returned to baseline and stable Postop Assessment: no apparent nausea or vomiting Anesthetic complications: no    Last Vitals:  Vitals:   11/20/17 1315 11/20/17 1330  BP: (!) 146/66 (!) 142/66  Pulse: 71 70  Resp: 16 16  Temp: 36.6 C 36.6 C  SpO2: 100% 100%    Last Pain:  Vitals:   11/20/17 1234  TempSrc:   PainSc: 0-No pain                 Ryan P Ellender

## 2017-11-20 NOTE — Transfer of Care (Signed)
Immediate Anesthesia Transfer of Care Note  Patient: Olan Kurek  Procedure(s) Performed: INSERTION PORT-A-CATH (Left Chest)  Patient Location: PACU  Anesthesia Type:General  Level of Consciousness: awake, alert  and oriented  Airway & Oxygen Therapy: Patient Spontanous Breathing and Patient connected to face mask oxygen  Post-op Assessment: Report given to RN and Post -op Vital signs reviewed and stable  Post vital signs: Reviewed and stable  Last Vitals:  Vitals:   11/19/17 2010 11/20/17 0420  BP: 105/60 (!) 144/77  Pulse: 86 82  Resp: 16 18  Temp: 36.4 C 36.5 C  SpO2: 99% 99%    Last Pain:  Vitals:   11/20/17 1000  TempSrc:   PainSc: 0-No pain      Patients Stated Pain Goal: 0 (10/24/84 2778)  Complications: No apparent anesthesia complications

## 2017-11-20 NOTE — Op Note (Signed)
11/20/2017  12:23 PM   PATIENT:  Albesa Seen  83 y.o. male  PRE-OPERATIVE DIAGNOSIS:  gastric cancer  POST-OPERATIVE DIAGNOSIS:  gastric cancer  PROCEDURE:  Procedure(s): INSERTION PORT-A-CATH (Left)  SURGEON:  Surgeon(s) and Role:    Ralene Ok, MD - Primary   ANESTHESIA:   local and general  EBL:  5 mL   BLOOD ADMINISTERED:none  DRAINS: none   LOCAL MEDICATIONS USED:  BUPIVICAINE   SPECIMEN:  No Specimen  DISPOSITION OF SPECIMEN:  N/A  COUNTS:  YES  TOURNIQUET:  * No tourniquets in log *  DICTATION: .Dragon Dictation  Complications:  none  Counts: reported as correct x 2  Findings:  Patient 8 French MRI-compatible power port placed in her left infraclavicular area. The tip lay at the junction of the SVC and atrial junction.  The port aspirated and flushed easily.  Indications for procedure:  The patient is a 83 y/o M with gastric cancer.  Pt is due for chemotx.    Details of the procedure:The patient was taken back to the operating room. The patient was placed in supine position with bilateral SCDs in place.  The patient was prepped and draped in the usual sterile fashion. After appropriate anitbiotics were confirmed, a time-out was confirmed and all facts were verified.  A 3 cm incision was made in the left deltopectoral groove. Bovie cautery was used to maintain hemostasis and dissection was taken down just superficial to the pectoralis fashion. Blunt dissection was used to create a pocket. The port was placed in this area check for a good fit. At this time a Seldinger technique was used to cannulize the subclavian vein. This was confirmed with fluoroscopy. At this time the introducer was then placed over the wire, the wire removed. In the catheter was then placed into the introducer.  Under direct fluoroscopy the catheter was repositioned to be at [facility] /atrial junction. This was approximately 22 cm. At this time the port was connected to the catheter and secured.  The port was sutured into the previously made pocket with 2-0 Prolene x2. 2-0 silk was then used to secure the junction of the catheter and port to the pectoralis fascia.   A final x-ray was then shot to confirm the placement of the tip of the catheter as well as the curve the catheter to the port. At this time the [facility] needle was used to aspirate from the port and flushed easily.At this time the skin was reapproximated using a 3-0 Monocryl subcuticular fashion. The skin was Dermabond. The patient was awakened from anesthesia and taken to recovery in stable condition.      PLAN OF CARE: Admit to inpatient   PATIENT DISPOSITION:  PACU - hemodynamically stable.   Delay start of Pharmacological VTE agent (>24hrs) due to surgical blood loss or risk of bleeding: no

## 2017-11-20 NOTE — Anesthesia Preprocedure Evaluation (Addendum)
Anesthesia Evaluation  Patient identified by MRN, date of birth, ID band Patient awake    Reviewed: Allergy & Precautions, NPO status , Patient's Chart, lab work & pertinent test results  Airway Mallampati: III  TM Distance: >3 FB Neck ROM: Full    Dental  (+) Teeth Intact, Dental Advisory Given   Pulmonary neg pulmonary ROS,    breath sounds clear to auscultation       Cardiovascular hypertension, Pt. on medications  Rhythm:Regular Rate:Normal     Neuro/Psych negative neurological ROS     GI/Hepatic Neg liver ROS, gastric cancer   Endo/Other  negative endocrine ROS  Renal/GU negative Renal ROS     Musculoskeletal negative musculoskeletal ROS (+)   Abdominal   Peds  Hematology  (+) anemia ,   Anesthesia Other Findings Day of surgery medications reviewed with the patient.  Reproductive/Obstetrics                            Anesthesia Physical  Anesthesia Plan  ASA: III  Anesthesia Plan: General   Post-op Pain Management:    Induction: Intravenous  PONV Risk Score and Plan: 2 and Ondansetron and Dexamethasone  Airway Management Planned: Oral ETT  Additional Equipment:   Intra-op Plan:   Post-operative Plan: Extubation in OR  Informed Consent: I have reviewed the patients History and Physical, chart, labs and discussed the procedure including the risks, benefits and alternatives for the proposed anesthesia with the patient or authorized representative who has indicated his/her understanding and acceptance.   Dental advisory given  Plan Discussed with: CRNA  Anesthesia Plan Comments:        Anesthesia Quick Evaluation

## 2017-11-20 NOTE — Discharge Summary (Signed)
Physician Discharge Summary  Gaylin Bulthuis QQV:[id number] DOB: [birth year] DOA: 11/09/2017  PCP: [name], MD  Admit date: 11/09/2017 Discharge date: 11/20/2017  Admitted From: Home Disposition: Home   Recommendations for Outpatient Follow-up:  1. Follow up with PCP in 1-2 weeks 2. Follow up with general surgery in 2 weeks 3. Follow up with oncology, Dr. Burr Medico in 1 - 2 weeks  Home Health: PT, OT, RN, aide, CSW Equipment/Devices: Hospital bed, tube feeding supplies Discharge Condition: Stage CODE STATUS: Full Diet recommendation: Tube feeds  Brief/Interim Summary: 83 year old male presented from primary care providers office for evaluation of significant anemia weight loss.  In the ED, hemoglobin was 7.8; this can the abdomen showed circumferential wall thickening involving the gastric antrum suggestive of element of gastric outlet obstruction.  EGD showed large antral mass which  turned out to be adenocarcinoma.  Patient underwent upper endoscopy with biopsy and gastrojejunostomy. Tube feeding has progressed and port-a-cath was placed by Dr. Rosendo Gros 11/21.   Discharge Diagnoses:  Principal Problem:   Partial gastric outlet obstruction Active Problems:   HTN (hypertension)   Hyperlipidemia   Arrhythmia   Acute blood loss anemia   Abnormal CT scan, stomach   Nausea without vomiting   Abnormal loss of weight   Gastric tumor   Abnormal CT of the abdomen   Gastric cancer (HCC)  Stage IV gastric adenocarcinoma: s/p diagnostic laparoscopy with biopsy, gastrostomy and jejunostomy, G-tube and J-tube insertion by general surgery. Also inserted port-a-cath 11/21.  - Follow up with oncology to begin therapy.  -General surgery following -outpatient follow up with Oncology at cancer center upon discharge.    Acute blood loss anemia: Stable since 2 units packed red cells transfusion on 11/10/2017 -Hemoglobin stable. Monitor at follow up.  Moderate protein calorie malnutrition -Continue  tube feedings per dietary recommendations.    Hypokalemia: Resolved. Monitor at follow up.   Hypertension: Has remained <150/90 without medications in NSR with rate in 70's. Will not start new medications.   Physical deconditioning - HH services/therapy as above. CSW also to be provided in case of need for nursing facility services.   Discharge Instructions Discharge Instructions    Call MD for:  difficulty breathing, headache or visual disturbances   Complete by:  As directed    Call MD for:  persistant nausea and vomiting   Complete by:  As directed    Call MD for:  redness, tenderness, or signs of infection (pain, swelling, redness, odor or green/yellow discharge around incision site)   Complete by:  As directed    Call MD for:  severe uncontrolled pain   Complete by:  As directed    Call MD for:  temperature >100.4   Complete by:  As directed    Increase activity slowly   Complete by:  As directed      Allergies as of 11/20/2017   No Known Allergies     Medication List    STOP taking these medications   diltiazem 120 MG tablet Commonly known as:  CARDIZEM   losartan 25 MG tablet Commonly known as:  COZAAR     TAKE these medications   aspirin EC 81 MG tablet Take 81 mg by mouth daily.   CARTIA XT 120 MG 24 hr capsule Generic drug:  diltiazem Take 120 mg daily as needed by mouth (Systolic BP > 332/95).            Durable Medical Equipment  (From admission, onward)  Start     Ordered   11/15/17 1808  For home use only DME Hospital bed  Once    Comments:  Head of bed 35 degrees; especially during feeding time.  Question Answer Comment  Patient has (list medical condition): patient gastric cancer and needs for tube feeding; underlying hx of reflux and increase chances for aspiration unless head elevated 35-40 degrees during infusions.   The above medical condition requires: Patient requires the ability to reposition frequently   Head must be  elevated greater than: Other see comments   Bed type Semi-electric   Trapeze Bar Yes   Support Surface: Gel Overlay      11/15/17 [place] Follow up.   Why:  hosptial bed;TF formula supplies Contact information: [place] [address] [place] Follow up.   Specialty:  [place] Why:  [facility] nursing/physical/occupational therapy/aide/social worker Contact information: [address] [place] [address] [phone]        [name], MD. Call in 2 week(s).   Specialty:  General Surgery Why:  call to arrange follow up from your recent surgery Contact information: [place] [address] [phone]        [name], MD. Call.   Specialties:  Hematology, Oncology Contact information: [place] [address] [phone]        [name], MD Follow up.   Specialty:  Internal Medicine Contact information: [place] [address] [phone]          No Known Allergies  Consultations: GI and general surgery Spoke to Dr. Magrinat/oncology  Procedures/Studies: Ct Chest Wo Contrast  Result Date: 11/19/2017 CLINICAL DATA:  84 year old male with newly diagnosed gastric cancer. Weight loss. Recent history of gastrojejunostomy performed on 11/12/2017. EXAM: CT CHEST WITHOUT CONTRAST TECHNIQUE: Multidetector CT imaging of the chest was performed following the standard protocol without IV contrast. COMPARISON:  No priors. FINDINGS: Cardiovascular: Heart size is normal. There is no significant pericardial fluid, thickening or pericardial calcification. There is aortic atherosclerosis, as well as atherosclerosis of the great vessels of the mediastinum and the coronary arteries, including calcified atherosclerotic plaque in the left main, left anterior  descending, left circumflex and right coronary arteries. Severe calcifications of the aortic valve. Ectasia of the ascending thoracic aorta (4 cm in diameter). Mediastinum/Nodes: No pathologically enlarged mediastinal or hilar lymph nodes. Please note that accurate exclusion of hilar adenopathy is limited on noncontrast CT scans. Esophagus is unremarkable in appearance. No axillary lymphadenopathy. Lungs/Pleura: Several calcified granulomas are noted in the right lung. Scattered areas of mild septal thickening are noted, most evident throughout the periphery of the right lower lobe particularly near the lung base, favored to reflect areas of mild post infectious/inflammatory scarring. No definite suspicious appearing pulmonary nodules or masses. No acute consolidative airspace disease. No pleural effusions. Extensive apical pleuroparenchymal thickening and architectural distortion is noted, particularly in the right apex where there is also some pleural calcification, presumably chronic post infectious or inflammatory scarring. Upper Abdomen: Small amount of pneumoperitoneum noted in the upper abdomen. Musculoskeletal: There are no aggressive appearing lytic or blastic lesions noted in the visualized portions of the skeleton. IMPRESSION: 1. No evidence of metastatic disease to the thorax. 2. Sequela of old granulomatous disease, as above. 3. Aortic atherosclerosis, in  addition to left main and 3 vessel coronary artery disease. 4. Ectasia of the ascending thoracic aorta (4 cm in diameter). Recommend annual imaging followup by CTA or MRA. This recommendation follows 2010 ACCF/AHA/AATS/ACR/ASA/SCA/SCAI/SIR/STS/SVM Guidelines for the Diagnosis and Management of Patients with Thoracic Aortic Disease. Circulation. 2010; 121: U045-W098. 5. There are calcifications of the aortic valve. Echocardiographic correlation for evaluation of potential valvular dysfunction may be warranted if clinically indicated. 6. Small volume of  pneumoperitoneum noted in the visualized upper abdomen, presumably related to recent gastrojejunostomy performed on 11/12/2017. However, clinical correlation is recommended to exclude any signs are symptoms concerning for perforation. Aortic Atherosclerosis (ICD10-I70.0). Electronically Signed   By: Vinnie Langton M.D.   On: 11/19/2017 15:23   Ct Abdomen Pelvis W Contrast  Result Date: 11/09/2017 CLINICAL DATA:  Hypotension and anemia. EXAM: CT ABDOMEN AND PELVIS WITH CONTRAST TECHNIQUE: Multidetector CT imaging of the abdomen and pelvis was performed using the standard protocol following bolus administration of intravenous contrast. CONTRAST:  100 cc Isovue 300 COMPARISON:  None. FINDINGS: Lower chest: Limited visualization of the lower thorax demonstrates short-segment occlusion of left lower lobe lateral basal bronchi (image 19, series 7, coronal image 64, series 4). Minimal dependent atelectasis/scar. Punctate granuloma within the right lower lobe. No pleural effusion. Normal heart size. Coronary artery calcifications. Calcifications within the mitral valve leaflets. No pericardial effusion. Hepatobiliary: Normal hepatic contour. No discrete hepatic lesions. No intra extrahepatic biliary duct dilatation. No ascites. Pancreas: Normal appearance of the pancreas Spleen: Normal appearance of the spleen Adrenals/Urinary Tract: There is symmetric enhancement and excretion of the bilateral kidneys. Note is made of a small right-sided extrarenal pelvis. No definite renal stones on this postcontrast examination. No urinary obstruction or perinephric stranding. Normal appearance the urinary bladder given underdistention. Normal appearance of the bilateral adrenal glands. Stomach/Bowel: There is circumferential wall thickening involving the gastric antrum (axial image 34, series 2, coronal image 43, series 4) with marked fluid distention of the stomach. The sigmoid colon is noted to be redundant. The hepatic flexure  of the colon is noted to be interposed adjacent to the anterolateral aspect of the liver. The bowel is otherwise normal in course and caliber without wall thickening or evidence of enteric obstruction. Normal appearance of the terminal ileum and appendix. No pneumoperitoneum, pneumatosis or portal venous gas. Vascular/Lymphatic: There is a large amount of mixed calcified and noncalcified irregular atherosclerotic plaque throughout the abdominal aorta. The abdominal aorta is noted to be mildly ectatic measuring approximately 2.7 cm in greatest diameter. The IMA is not definitely identified. Irregular calcified plaque involves the origin of all the major branch vessels of the abdominal aorta however the major branch vessels appear patent. Suspected hemodynamically significant narrowings involving the bilateral external iliac arteries as well as the right common femoral artery (image 81, series 2). Reproductive: Normal sized prostate gland. Small amount of free fluid in the pelvic cul-de-sac. Other: Regional soft tissues appear normal. Musculoskeletal: No acute or aggressive osseous abnormalities. Stigmata of DISH within the lower thoracic spine. IMPRESSION: 1. Circumferential wall thickening involving the gastric antrum with marked distension of the stomach suggestive of an element of gastric outlet obstruction. Further evaluation with endoscopy is recommended. 2. Short-segment occlusion involving the left lower lobe lateral basal subsegmental bronchus, nonspecific though could be indicative of aspiration in the setting of suspected gastric outlet obstruction. Clinical correlation is advised. 3. Small amount of fluid seen with the pelvic cul-de-sac, presumably reactive. 4. Mild ectasia of the abdominal aorta measuring 2.7 cm in  diameter. Recommend follow-up aortic ultrasound in 5 years. This recommendation follows ACR consensus guidelines: White Paper of the ACR Incidental Findings Committee II on Vascular Findings. J  Am Coll Radiol 2013; 47:425-956. 5. Aortic Atherosclerosis (ICD10-I70.0). Suspected hemodynamically significant stenoses involving the bilateral external iliac arteries and the right common femoral artery. Correlation for lower extremity PAD symptoms is recommended. Further evaluation with the acquisition of ABIs could be performed as indicated. Electronically Signed   By: Sandi Mariscal M.D.   On: 11/09/2017 12:22   Dg Chest Port 1 View  Result Date: 11/20/2017 CLINICAL DATA:  Port-A-Cath insertion in the operating room today. EXAM: PORTABLE CHEST 1 VIEW COMPARISON:  Fluoro spot radiographs of today's date and chest x-ray of November 08, 2017 FINDINGS: The porta catheter tip projects over the junction of the proximal and midportions of the SVC. The reservoir projects over the left pectoral region. There is no postprocedure pneumothorax. The lungs are well-expanded. There is evidence of previous granulomatous infection on the right which is stable. The heart and pulmonary vascularity are normal. There is calcification in the wall of the aortic arch. The bony thorax exhibits no acute abnormality. IMPRESSION: No postprocedure complication following Port-A-Cath appliance placement. Thoracic aortic atherosclerosis. Electronically Signed   By: David  Martinique M.D.   On: 11/20/2017 13:12   Dg Abd Acute W/chest  Result Date: 11/08/2017 CLINICAL DATA:  Epigastric abdominal pain, vomiting and weight loss. EXAM: DG ABDOMEN ACUTE W/ 1V CHEST COMPARISON:  08/23/2015 chest radiograph. FINDINGS: Stable cardiomediastinal silhouette with normal heart size and aortic atherosclerosis. No pneumothorax. No pleural effusion. Stable right apical pleural-parenchymal scarring and calcification. Stable scattered granulomas at the periphery of the right lung. No pulmonary edema. No acute consolidative airspace disease. No dilated small bowel loops or significant air-fluid levels. Minimal stool and gas in the large bowel. No evidence of  pneumatosis or pneumoperitoneum. No radiopaque nephrolithiasis. Extensive iliofemoral atherosclerosis. Moderate lumbar spondylosis. IMPRESSION: 1. No active cardiopulmonary disease. 2. Nonobstructive bowel gas pattern. Electronically Signed   By: Ilona Sorrel M.D.   On: 11/08/2017 15:54   Dg C-arm 1-60 Min-no Report  Result Date: 11/20/2017 Fluoroscopy was utilized by the requesting physician.  No radiographic interpretation.    EGD 11/10/2017  Diagnostic laparoscopy with biopsy, gastrojejunostomy, G tube placement and J tube.  Port-a-cath placement 11/20/2017  Subjective: Wants to go home, having no pain since port placement. No problems with tube feeding. Pt and wife received education/training by RN  Discharge Exam: Vitals:   11/20/17 1315 11/20/17 1330  BP: (!) 146/66 (!) 142/66  Pulse: 71 70  Resp: 16 16  Temp: 97.8 F (36.6 C) 97.8 F (36.6 C)  SpO2: 100% 100%   General: Pt is alert, awake, not in acute distress Cardiovascular: RRR, S1/S2 +, no rubs, no gallops Respiratory: CTA bilaterally, no wheezing, no rhonchi Abdominal: Soft, NT, ND, bowel sounds +. Sites appear healthy without significant erythema, no discharge. Nontender.  Extremities: No edema, no cyanosis Skin: Left upper chest with nontender port subcutaneously, incision site without erythema or discharge with dermabond in place.   Labs: BNP (last 3 results) No results for input(s): BNP in the last 8760 hours. Basic Metabolic Panel: Recent Labs  Lab 11/15/17 0423 11/17/17 0507 11/18/17 0513 11/20/17 0504  NA 136 141 140 140  K 3.3* 3.3* 3.6 3.9  CL 109 110 110 106  CO2 20* 23 24 26   GLUCOSE 120* 143* 127* 112*  BUN 14 21* 27* 41*  CREATININE 0.83 0.79 0.74 0.88  CALCIUM 8.1* 8.5* 8.6* 8.9  MG  --  2.0 2.1 2.3  PHOS  --  1.9*  --   --    Liver Function Tests: No results for input(s): AST, ALT, ALKPHOS, BILITOT, PROT, ALBUMIN in the last 168 hours. No results for input(s): LIPASE, AMYLASE in the  last 168 hours. No results for input(s): AMMONIA in the last 168 hours. CBC: Recent Labs  Lab 11/15/17 0423 11/17/17 0507 11/18/17 0513 11/20/17 0504  WBC 9.9 7.3 6.2 7.8  NEUTROABS  --  4.7 3.8 4.9  HGB 10.1* 9.9* 10.0* 10.6*  HCT 30.6* 30.8* 31.3* 33.1*  MCV 86.0 87.0 88.2 88.7  PLT 357 359 359 400   Cardiac Enzymes: No results for input(s): CKTOTAL, CKMB, CKMBINDEX, TROPONINI in the last 168 hours. BNP: Invalid input(s): POCBNP CBG: Recent Labs  Lab 11/19/17 1536 11/19/17 2008 11/20/17 0025 11/20/17 0416 11/20/17 0748  GLUCAP 105* 143* 128* 104* 111*   D-Dimer No results for input(s): DDIMER in the last 72 hours. Hgb A1c No results for input(s): HGBA1C in the last 72 hours. Lipid Profile No results for input(s): CHOL, HDL, LDLCALC, TRIG, CHOLHDL, LDLDIRECT in the last 72 hours. Thyroid function studies No results for input(s): TSH, T4TOTAL, T3FREE, THYROIDAB in the last 72 hours.  Invalid input(s): FREET3 Anemia work up No results for input(s): VITAMINB12, FOLATE, FERRITIN, TIBC, IRON, RETICCTPCT in the last 72 hours. Urinalysis    Component Value Date/Time   COLORURINE YELLOW 11/09/2017 [place] 11/09/2017 1202   LABSPEC 1.006 11/09/2017 1202   PHURINE 6.0 11/09/2017 1202   GLUCOSEU NEGATIVE 11/09/2017 1202   HGBUR NEGATIVE 11/09/2017 1202   BILIRUBINUR NEGATIVE 11/09/2017 1202   KETONESUR 5 (A) 11/09/2017 1202   PROTEINUR NEGATIVE 11/09/2017 1202   NITRITE NEGATIVE 11/09/2017 [place] 11/09/2017 1202    Microbiology Recent Results (from the past 240 hour(s))  Surgical pcr screen     Status: None   Collection Time: 11/11/17  1:18 PM  Result Value Ref Range Status   MRSA, PCR NEGATIVE NEGATIVE Final   Staphylococcus aureus NEGATIVE NEGATIVE Final    Comment: (NOTE) The Xpert SA Assay (FDA approved for NASAL specimens in patients 65 years of age and older), is one component of a comprehensive surveillance  program. It is not intended to diagnose infection nor to guide or monitor treatment.     Time coordinating discharge: Approximately 40 minutes  Vance Gather, MD  [facility] 11/20/2017, 2:57 PM Pager [phone]

## 2017-11-20 NOTE — Anesthesia Procedure Notes (Signed)
Procedure Name: Intubation Date/Time: 11/20/2017 11:51 AM Performed by: Glory Buff, CRNA Pre-anesthesia Checklist: Patient identified, Emergency Drugs available, Suction available and Patient being monitored Patient Re-evaluated:Patient Re-evaluated prior to induction Oxygen Delivery Method: Circle system utilized Preoxygenation: Pre-oxygenation with 100% oxygen Induction Type: IV induction Ventilation: Mask ventilation without difficulty Laryngoscope Size: Miller and 3 Grade View: Grade II Tube type: Oral Tube size: 7.0 mm Number of attempts: 1 Airway Equipment and Method: Stylet and Oral airway Placement Confirmation: ETT inserted through vocal cords under direct vision,  positive ETCO2 and breath sounds checked- equal and bilateral Secured at: 20 cm Tube secured with: Tape Dental Injury: Teeth and Oropharynx as per pre-operative assessment

## 2017-11-20 NOTE — Progress Notes (Signed)
Nutrition Follow-up  DOCUMENTATION CODES:   Non-severe (moderate) malnutrition in context of acute illness/injury  INTERVENTION:   Once medically able, resume TF at 1800: Osmolite 1.5 @ 65 ml/hr via J-tube, if tolerating after 6 hours advance to 75 ml/hr for remaining 10 hours of infusion.  Goal rate will be: Osmolite 1.5 @ 85 ml/hr x 16 hours via J-tube (1800-1000). Pt will need additional free water of 240 ml TID. This will provide 2040 kcal, 85g protein and 1756 ml H2O.  NUTRITION DIAGNOSIS:   Moderate Malnutrition related to acute illness(gastric outlet obstruction) as evidenced by energy intake < or equal to 50% for > or equal to 1 month, moderate fat depletion, severe fat depletion, severe muscle depletion.  Ongoing.  GOAL:   Patient will meet greater than or equal to 90% of their needs  Progressing.  MONITOR:   TF tolerance, PO intake, Diet advancement, Weight trends, Labs, Skin  ASSESSMENT:   Pt with PMH significant for HTN. Presents this admission with complaints of decreased appetite and abdominal distention. CT scan showed at partial gastric outlet obstruction. Admitted for anemia secondary to GI blood loss.   RD attempted to see patient x 2, once when PT was working with patient and now patient is in OR for port-a-cath placement.  Received page regarding TF recommendations per case manager. Recommendations above.  TF were held at midnight tonight d/t procedure. No further documentation of diarrhea. Pt needed IV Zofran this morning.  Would resume TF tonight.  Will monitor for results of c.diff test.  Medications: IV Zofran PRN Labs reviewed:  CBGs: 104-111  Diet Order:  Diet NPO time specified  EDUCATION NEEDS:   Education needs have been addressed  Skin:  Skin Assessment: Skin Integrity Issues: Skin Integrity Issues:: Incisions Incisions: Abdominal  Last BM:  11/20  Height:   Ht Readings from Last 1 Encounters:  11/09/17 5\' 9"  (1.753 m)     Weight:   Wt Readings from Last 1 Encounters:  11/20/17 120 lb 5.9 oz (54.6 kg)    Ideal Body Weight:  72.7 kg  BMI:  Body mass index is 17.78 kg/m.  Estimated Nutritional Needs:   Kcal:  1800-2000 kcal/day  Protein:  90-100 g/day  Fluid:  >1.8 L/day  Clayton Bibles, MS, RD, LDN [facility] Dietitian Pager: [phone] After Hours Pager: [phone]

## 2017-11-21 DIAGNOSIS — Z7982 Long term (current) use of aspirin: Secondary | ICD-10-CM | POA: Diagnosis not present

## 2017-11-21 DIAGNOSIS — K210 Gastro-esophageal reflux disease with esophagitis: Secondary | ICD-10-CM | POA: Diagnosis not present

## 2017-11-21 DIAGNOSIS — C168 Malignant neoplasm of overlapping sites of stomach: Secondary | ICD-10-CM | POA: Diagnosis not present

## 2017-11-21 DIAGNOSIS — E43 Unspecified severe protein-calorie malnutrition: Secondary | ICD-10-CM | POA: Diagnosis not present

## 2017-11-21 DIAGNOSIS — K2970 Gastritis, unspecified, without bleeding: Secondary | ICD-10-CM | POA: Diagnosis not present

## 2017-11-21 DIAGNOSIS — I10 Essential (primary) hypertension: Secondary | ICD-10-CM | POA: Diagnosis not present

## 2017-11-21 DIAGNOSIS — D62 Acute posthemorrhagic anemia: Secondary | ICD-10-CM | POA: Diagnosis not present

## 2017-11-21 DIAGNOSIS — Z431 Encounter for attention to gastrostomy: Secondary | ICD-10-CM | POA: Diagnosis not present

## 2017-11-21 DIAGNOSIS — E785 Hyperlipidemia, unspecified: Secondary | ICD-10-CM | POA: Diagnosis not present

## 2017-11-23 DIAGNOSIS — D62 Acute posthemorrhagic anemia: Secondary | ICD-10-CM | POA: Diagnosis not present

## 2017-11-23 DIAGNOSIS — K2970 Gastritis, unspecified, without bleeding: Secondary | ICD-10-CM | POA: Diagnosis not present

## 2017-11-23 DIAGNOSIS — Z431 Encounter for attention to gastrostomy: Secondary | ICD-10-CM | POA: Diagnosis not present

## 2017-11-23 DIAGNOSIS — C168 Malignant neoplasm of overlapping sites of stomach: Secondary | ICD-10-CM | POA: Diagnosis not present

## 2017-11-23 DIAGNOSIS — K210 Gastro-esophageal reflux disease with esophagitis: Secondary | ICD-10-CM | POA: Diagnosis not present

## 2017-11-23 DIAGNOSIS — E43 Unspecified severe protein-calorie malnutrition: Secondary | ICD-10-CM | POA: Diagnosis not present

## 2017-11-25 DIAGNOSIS — D62 Acute posthemorrhagic anemia: Secondary | ICD-10-CM | POA: Diagnosis not present

## 2017-11-25 DIAGNOSIS — Z431 Encounter for attention to gastrostomy: Secondary | ICD-10-CM | POA: Diagnosis not present

## 2017-11-25 DIAGNOSIS — E43 Unspecified severe protein-calorie malnutrition: Secondary | ICD-10-CM | POA: Diagnosis not present

## 2017-11-25 DIAGNOSIS — K210 Gastro-esophageal reflux disease with esophagitis: Secondary | ICD-10-CM | POA: Diagnosis not present

## 2017-11-25 DIAGNOSIS — C168 Malignant neoplasm of overlapping sites of stomach: Secondary | ICD-10-CM | POA: Diagnosis not present

## 2017-11-25 DIAGNOSIS — K2970 Gastritis, unspecified, without bleeding: Secondary | ICD-10-CM | POA: Diagnosis not present

## 2017-11-26 DIAGNOSIS — E43 Unspecified severe protein-calorie malnutrition: Secondary | ICD-10-CM | POA: Diagnosis not present

## 2017-11-26 DIAGNOSIS — C168 Malignant neoplasm of overlapping sites of stomach: Secondary | ICD-10-CM | POA: Diagnosis not present

## 2017-11-26 DIAGNOSIS — K210 Gastro-esophageal reflux disease with esophagitis: Secondary | ICD-10-CM | POA: Diagnosis not present

## 2017-11-26 DIAGNOSIS — K2970 Gastritis, unspecified, without bleeding: Secondary | ICD-10-CM | POA: Diagnosis not present

## 2017-11-26 DIAGNOSIS — D62 Acute posthemorrhagic anemia: Secondary | ICD-10-CM | POA: Diagnosis not present

## 2017-11-26 DIAGNOSIS — Z431 Encounter for attention to gastrostomy: Secondary | ICD-10-CM | POA: Diagnosis not present

## 2017-11-28 DIAGNOSIS — E43 Unspecified severe protein-calorie malnutrition: Secondary | ICD-10-CM | POA: Diagnosis not present

## 2017-11-28 DIAGNOSIS — K2970 Gastritis, unspecified, without bleeding: Secondary | ICD-10-CM | POA: Diagnosis not present

## 2017-11-28 DIAGNOSIS — D62 Acute posthemorrhagic anemia: Secondary | ICD-10-CM | POA: Diagnosis not present

## 2017-11-28 DIAGNOSIS — C168 Malignant neoplasm of overlapping sites of stomach: Secondary | ICD-10-CM | POA: Diagnosis not present

## 2017-11-28 DIAGNOSIS — K210 Gastro-esophageal reflux disease with esophagitis: Secondary | ICD-10-CM | POA: Diagnosis not present

## 2017-11-28 DIAGNOSIS — Z431 Encounter for attention to gastrostomy: Secondary | ICD-10-CM | POA: Diagnosis not present

## 2017-11-29 DIAGNOSIS — D62 Acute posthemorrhagic anemia: Secondary | ICD-10-CM | POA: Diagnosis not present

## 2017-11-29 DIAGNOSIS — C168 Malignant neoplasm of overlapping sites of stomach: Secondary | ICD-10-CM | POA: Diagnosis not present

## 2017-11-29 DIAGNOSIS — K210 Gastro-esophageal reflux disease with esophagitis: Secondary | ICD-10-CM | POA: Diagnosis not present

## 2017-11-29 DIAGNOSIS — Z431 Encounter for attention to gastrostomy: Secondary | ICD-10-CM | POA: Diagnosis not present

## 2017-11-29 DIAGNOSIS — E43 Unspecified severe protein-calorie malnutrition: Secondary | ICD-10-CM | POA: Diagnosis not present

## 2017-11-29 DIAGNOSIS — K2970 Gastritis, unspecified, without bleeding: Secondary | ICD-10-CM | POA: Diagnosis not present

## 2017-12-03 DIAGNOSIS — C168 Malignant neoplasm of overlapping sites of stomach: Secondary | ICD-10-CM | POA: Diagnosis not present

## 2017-12-03 DIAGNOSIS — D62 Acute posthemorrhagic anemia: Secondary | ICD-10-CM | POA: Diagnosis not present

## 2017-12-03 DIAGNOSIS — Z431 Encounter for attention to gastrostomy: Secondary | ICD-10-CM | POA: Diagnosis not present

## 2017-12-03 DIAGNOSIS — K2970 Gastritis, unspecified, without bleeding: Secondary | ICD-10-CM | POA: Diagnosis not present

## 2017-12-03 DIAGNOSIS — K210 Gastro-esophageal reflux disease with esophagitis: Secondary | ICD-10-CM | POA: Diagnosis not present

## 2017-12-03 DIAGNOSIS — E43 Unspecified severe protein-calorie malnutrition: Secondary | ICD-10-CM | POA: Diagnosis not present

## 2017-12-04 ENCOUNTER — Ambulatory Visit (HOSPITAL_BASED_OUTPATIENT_CLINIC_OR_DEPARTMENT_OTHER): Payer: Medicare Other | Admitting: Nurse Practitioner

## 2017-12-04 ENCOUNTER — Encounter: Payer: Self-pay | Admitting: Nurse Practitioner

## 2017-12-04 ENCOUNTER — Telehealth: Payer: Self-pay | Admitting: Hematology

## 2017-12-04 VITALS — BP 112/72 | HR 112 | Temp 97.5°F | Resp 18 | Ht 69.0 in | Wt 118.7 lb

## 2017-12-04 DIAGNOSIS — C786 Secondary malignant neoplasm of retroperitoneum and peritoneum: Secondary | ICD-10-CM

## 2017-12-04 DIAGNOSIS — C163 Malignant neoplasm of pyloric antrum: Secondary | ICD-10-CM

## 2017-12-04 DIAGNOSIS — R634 Abnormal weight loss: Secondary | ICD-10-CM

## 2017-12-04 DIAGNOSIS — C169 Malignant neoplasm of stomach, unspecified: Secondary | ICD-10-CM

## 2017-12-04 DIAGNOSIS — Z7189 Other specified counseling: Secondary | ICD-10-CM | POA: Insufficient documentation

## 2017-12-04 DIAGNOSIS — I10 Essential (primary) hypertension: Secondary | ICD-10-CM

## 2017-12-04 DIAGNOSIS — R110 Nausea: Secondary | ICD-10-CM

## 2017-12-04 DIAGNOSIS — K5900 Constipation, unspecified: Secondary | ICD-10-CM

## 2017-12-04 MED ORDER — PROCHLORPERAZINE MALEATE 10 MG PO TABS: 10.0000 mg | ORAL_TABLET | Freq: Four times a day (QID) | ORAL | 1 refills | Status: AC | PRN

## 2017-12-04 MED ORDER — ONDANSETRON 8 MG PO TBDP: 8.0000 mg | ORAL_TABLET | Freq: Two times a day (BID) | ORAL | 1 refills | Status: AC | PRN

## 2017-12-04 MED ORDER — ONDANSETRON HCL 8 MG PO TABS: 8.0000 mg | ORAL_TABLET | Freq: Two times a day (BID) | ORAL | 1 refills | Status: AC | PRN

## 2017-12-04 MED ORDER — LIDOCAINE-PRILOCAINE 2.5-2.5 % EX CREA: TOPICAL_CREAM | CUTANEOUS | 3 refills | Status: AC

## 2017-12-04 MED ORDER — DOCUSATE SODIUM 100 MG PO CAPS: 100.0000 mg | ORAL_CAPSULE | Freq: Two times a day (BID) | ORAL | 1 refills | Status: AC | PRN

## 2017-12-04 NOTE — Progress Notes (Addendum)
[facility]  Telephone:(336) [phone] Fax:(336) [phone]  Clinic Follow up Note   Patient Care Team: [name], MD as PCP - General (Internal Medicine) [name], MD as Consulting Physician (General Surgery) [name], MD as Consulting Physician (Hematology) [name], NP as Nurse Practitioner (Nurse Practitioner) 12/04/2017  CHIEF COMPLAINT: F/u metastatic gastric cancer   SUMMARY OF ONCOLOGIC HISTORY:   Gastric cancer ([place])   11/09/2017 Imaging    CT A/P IMPRESSION: 1. Circumferential wall thickening involving the gastric antrum with marked distension of the stomach suggestive of an element of gastric outlet obstruction. Further evaluation with endoscopy is recommended. 2. Short-segment occlusion involving the left lower lobe lateral basal subsegmental bronchus, nonspecific though could be indicative of aspiration in the setting of suspected gastric outlet obstruction. Clinical correlation is advised. 3. Small amount of fluid seen with the pelvic cul-de-sac, presumably reactive. 4. Mild ectasia of the abdominal aorta measuring 2.7 cm in diameter. Recommend follow-up aortic ultrasound in 5 years. This recommendation follows ACR consensus guidelines: White Paper of the ACR Incidental Findings Committee II on Vascular Findings. J Am Coll Radiol 2013; 87:867-672. 5. Aortic Atherosclerosis (ICD10-I70.0). Suspected hemodynamically significant stenoses involving the bilateral external iliac arteries and the right common femoral artery. Correlation for lower extremity PAD symptoms is recommended. Further evaluation with the acquisition of ABIs could be performed as indicated.      11/10/2017 Procedure    Upper endoscopy Findings: per Dr. Hilarie Fredrickson Mild esophagitis with no bleeding was found in the lower third of the esophagus. This is likely from reflux in the setting of partial gastric outlet obstruction. Retained fluid and food debris was found in  the gastric body. This was aspirated via the suction channel of the endoscope but was unable to be fully cleared due to semi-solid material. 1800 mL was removed. Diffuse moderate inflammation characterized by erythema and granularity was found in the cardia, in the gastric fundus and in the gastric body. A large, fungating and friable mass with no active bleeding was found at the incisura and in the gastric antrum. The mass was covered with mucus. This was biopsied extensively with a cold forceps for histology. Attempt made to maneuver around this mass and find the pylorus/duodenum, however given retained food/food in the stomach and lack of airway protection, this was not possible. An examination of the duodenum was not performed.      11/10/2017 Pathology Results    Diagnosis Stomach, biopsy, gastric mass - ADENOCARCINOMA - SEE COMMENT  Results: HER2 - NEGATIVE RATIO OF HER2/CEP17 SIGNALS 1.92 AVERAGE HER2 COPY NUMBER PER CELL 2.45  Microscopic Comment Based on the biopsy the carcinoma is well-differentiated. Dr. Gari Crown reviewed the case and agrees with the above diagnosis. Dr. Hilarie Fredrickson was notified of these results on November 12, 2017. Her-2 is pending and will be reported in an addendum. Warthin-Starry is negative for H. pylori. 1 of      11/12/2017 Pathology Results    Diagnosis Peritoneum, biopsy - METASTATIC ADENOCARCINOMA. Microscopic Comment The morphologic appearance is similar to previously diagnosed gastric adenocarcinoma ([id number]). (BNS:ah/gt, 11/13/17)      11/12/2017 Surgery    PROCEDURE:   Diagnostic Laparoscopy with biopsy, gastojejunostomy, insertion of G-tube and J-tube   Surgeon(s): [name], MD        [id number] Initial Diagnosis    Gastric cancer ([place])      11/19/2017 Imaging    CT CHEST IMPRESSION: 1. No evidence of metastatic disease to the thorax. 2. Sequela of old  granulomatous disease, as above. 3. Aortic atherosclerosis, in  addition to left main and 3 vessel coronary artery disease. 4. Ectasia of the ascending thoracic aorta (4 cm in diameter). Recommend annual imaging followup by CTA or MRA. This recommendation follows 2010 ACCF/AHA/AATS/ACR/ASA/SCA/SCAI/SIR/STS/SVM Guidelines for the Diagnosis and Management of Patients with Thoracic Aortic Disease. Circulation. 2010; 121: P509-T267. 5. There are calcifications of the aortic valve. Echocardiographic correlation for evaluation of potential valvular dysfunction may be warranted if clinically indicated. 6. Small volume of pneumoperitoneum noted in the visualized upper abdomen, presumably related to recent gastrojejunostomy performed on 11/12/2017. However, clinical correlation is recommended to exclude any signs are symptoms concerning for perforation.      11/20/2017 Procedure    PAC placement     History of present illness:    Lee Pratt was seen in the hospital due to symptomatic anemia and abdominal pain. He reports that he initially had symptoms of decreased appetite, weight loss of 10-20 lbs, and intermittent abdominal pain. Wife reports the patient having emesis. He reports that his energy levels in the past couple of months has declined. Wife reports that the patient has had generalized weakness. He notes that for 2 months he has had these symptoms and that prior to that his energy level was good and he used to walk everyday. He denies doing anything else besides walking prior to his recent diagnosis. Wife reports that the patient has a hx of HTN and only takes ASA. He denies MI or any other medical issues. 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 He has two children who live in [place]. Wife states that they moved to [place] due to the cold weather and they do not know any family members locally. Wife reports that they go to church, but they do not drive.   He had a CT AP completed on 11/09/2017 with results of: IMPRESSION: 1.  Circumferential wall thickening involving the gastric antrum with marked distension of the stomach suggestive of an element of gastric outlet obstruction. Further evaluation with endoscopy is recommended. 2. Short-segment occlusion involving the left lower lobe lateral basal subsegmental bronchus, nonspecific though could be indicative of aspiration in the setting of suspected gastric outlet obstruction. Clinical correlation is advised. 3. Small amount of fluid seen with the pelvic cul-de-sac, presumably reactive. 4. Mild ectasia of the abdominal aorta measuring 2.7 cm in diameter. Recommend follow-up aortic ultrasound in 5 years. This recommendation follows ACR consensus guidelines: White Paper of the ACR Incidental Findings Committee II on Vascular Findings. J Am Coll Radiol 2013; 12:458-099. 5. Aortic Atherosclerosis (ICD10-I70.0). Suspected hemodynamically significant stenoses involving the bilateral external iliac arteries and the right common femoral artery. Correlation for lower extremity PAD symptoms is recommended. Further evaluation with the acquisition of ABIs could be performed as indicated.  He was evaluated in [facility] and admitted for abdominal pain, anemia, and partial gastric outlet obstruction on 11/09/2017. He also had weight loss and dark colored stool during his ED visit on 11/09/2017 prior to being admitted to the hospital. He had an EGD completed on 11/10/2017 which was performed by Dr. Zenovia Jarred with pathology results showing: adenocarcinoma of gastric mass. Subsequently, he had a diagnostic larproscopic with biopsy, gastrojejunostomy, insertion of G and J tube performed by Dr. [name] on 83/38/2505 with pathology results of peritoneum, biopsy with metastatic adenocarcinoma. During his admission, he has received IV feraheme twice on 11/12/2017 and 11/15/2017 due to symptomatic anemia.  INTERVAL HISTORY: Mr. Seavey returns for first follow up in our clinic, he was seen by Dr. Burr Medico  inpatient on 11/19/17. Since discharge he has been recovering from surgery, denies abdominal pain. Has mild nausea without emesis and constipation with difficult stool q1-2 days. Home care has helped establish tube feeding regimen, initially to run at 85 ml/hr but he could not tolerate. He tolerates cyclic schedule of 8 hour feeding at 50 ml/hr with 4 hour rest repetitively. He was seen in f/u by Dr. Marcello Moores yesterday who instructed him to begin po liquid diet, he has not done so yet. He takes only sips by mouth. Has lost 2 more pounds since hospital discharge, 19 pounds total since presentation on 11/08/17. Can swallow pills without difficulty. He has no other specific complaints today.  REVIEW OF SYSTEMS:   Constitutional: Denies fevers, chills (+) abnormal weight loss Ears, nose, mouth, throat, and face: Denies mucositis or sore throat Respiratory: Denies dyspnea or wheezes (+) dry cough  Cardiovascular: Denies palpitation, chest discomfort or lower extremity swelling Gastrointestinal:  Denies emesis, diarrhea, dysphagia, bloating, early satiety, heartburn, abdominal pain, or blood in stool (+) intermittent nausea without emesis (+) hard stool q1-2 days (+) RLQ PEG and J tube (+) midline abdominal incision Skin: Denies abnormal skin rashes Lymphatics: Denies new lymphadenopathy or easy bruising Neurological:Denies numbness, tingling or new weaknesses Behavioral/Psych: Mood is stable, no new changes  All other systems were reviewed with the patient and are negative.  MEDICAL HISTORY:  Past Medical History:  Diagnosis Date  . Hyperglycemia 03/16/2013  . Hypertension   . Stomach cancer [facility])     SURGICAL HISTORY: Past Surgical History:  Procedure Laterality Date  . ESOPHAGOGASTRODUODENOSCOPY N/A 11/10/2017   Procedure: ESOPHAGOGASTRODUODENOSCOPY (EGD);  Surgeon: Jerene Bears, MD;  Location: [facility];  Service: Gastroenterology;  Laterality: N/A;  . GASTROJEJUNOSTOMY N/A 11/12/2017    Procedure: Diagnostic Laparoscopy with biopsy, gastojejunostomy, insertion of G-tube and J-tube;  Surgeon: [name], MD;  Location: [facility];  Service: General;  Laterality: N/A;  . NO PAST SURGERIES  12/21/2011  . PORTACATH PLACEMENT Left 11/20/2017   Procedure: INSERTION PORT-A-CATH;  Surgeon: Ralene Ok, MD;  Location: [facility];  Service: General;  Laterality: Left;    I have reviewed the social history and family history with the patient and they are unchanged from previous note.  ALLERGIES:  has No Known Allergies.  MEDICATIONS:  Current Outpatient Medications  Medication Sig Dispense Refill  . aspirin EC 81 MG tablet Take 81 mg by mouth daily.    Marland Kitchen CARTIA XT 120 MG 24 hr capsule Take 120 mg daily as needed by mouth (Systolic BP > 846/65).     . docusate sodium (COLACE) 100 MG capsule Take 1 capsule (100 mg total) by mouth 2 (two) times daily as needed for mild constipation. 30 capsule 1  . ondansetron (ZOFRAN ODT) 8 MG disintegrating tablet Take 1 tablet (8 mg total) by mouth 2 (two) times daily as needed for nausea or vomiting. 20 tablet 1   No current facility-administered medications for this visit.     PHYSICAL EXAMINATION: ECOG PERFORMANCE STATUS: 2 - Symptomatic, <50% confined to bed  Vitals:   12/04/17 1429  BP: 112/72  Pulse: (!) 112  Resp: 18  Temp: (!) 97.5 F (36.4 C)  SpO2: 100%   Filed Weights   12/04/17 1429  Weight: 118 lb 11.2 oz (53.8 kg)    GENERAL:alert, no distress and comfortable SKIN: skin color, texture are normal, no  rashes or significant lesions (+) dry mucous membranes  EYES: normal, Conjunctiva are pink and non-injected, sclera clear OROPHARYNX:no exudate, no erythema and lips, buccal mucosa, and tongue normal  NECK: supple, thyroid normal size, non-tender, without nodularity LYMPH:  no palpable cervical, supra/infraclavicular, axillary, or inguinal lymphadenopathy  LUNGS: clear to auscultation bilaterally with normal breathing  effort HEART: regular rate & rhythm and no murmurs and no lower extremity edema ABDOMEN:abdomen soft, non-tender and normal bowel sounds (+) midline abdominal incision healing well, no erythema or discharge (+) RLQ PEG and J tube, no drainage or erythema Musculoskeletal:no cyanosis of digits and no clubbing  NEURO: alert & oriented x 3 with fluent speech, no focal motor/sensory deficits PAC without erythema   LABORATORY DATA:  I have reviewed the data as listed CBC Latest Ref Rng & Units 11/20/2017 11/18/2017 11/17/2017  WBC 4.0 - 10.5 K/uL 7.8 6.2 7.3  Hemoglobin 13.0 - 17.0 g/dL 10.6(L) 10.0(L) 9.9(L)  Hematocrit 39.0 - 52.0 % 33.1(L) 31.3(L) 30.8(L)  Platelets 150 - 400 K/uL 400 359 359     CMP Latest Ref Rng & Units 11/20/2017 11/18/2017 11/17/2017  Glucose 65 - 99 mg/dL 112(H) 127(H) 143(H)  BUN 6 - 20 mg/dL 41(H) 27(H) 21(H)  Creatinine 0.61 - 1.24 mg/dL 0.88 0.74 0.79  Sodium 135 - 145 mmol/L 140 140 141  Potassium 3.5 - 5.1 mmol/L 3.9 3.6 3.3(L)  Chloride 101 - 111 mmol/L 106 110 110  CO2 22 - 32 mmol/L _0 Calcium 8.9 - 10.3 mg/dL 8.9 8.6(L) 8.5(L)  Total Protein 6.5 - 8.1 g/dL - - -  Total Bilirubin 0.3 - 1.2 mg/dL - - -  Alkaline Phos 38 - 126 U/L - - -  AST 15 - 41 U/L - - -  ALT 17 - 63 U/L - - -   11/12/17 Diagnosis Peritoneum, biopsy - METASTATIC ADENOCARCINOMA  11/10/17 Diagnosis Stomach, biopsy, gastric mass - ADENOCARCINOMA - SEE COMMENT HER2 - NEGATIVE   RADIOGRAPHIC STUDIES: I have personally reviewed the radiological images as listed and agreed with the findings in the report. No results found.   11/19/17 CT CHEST IMPRESSION: 1. No evidence of metastatic disease to the thorax. 2. Sequela of old granulomatous disease, as above. 3. Aortic atherosclerosis, in addition to left main and 3 vessel coronary artery disease. 4. Ectasia of the ascending thoracic aorta (4 cm in diameter). Recommend annual imaging followup by CTA or MRA. This  recommendation follows 2010 ACCF/AHA/AATS/ACR/ASA/SCA/SCAI/SIR/STS/SVM Guidelines for the Diagnosis and Management of Patients with Thoracic Aortic Disease. Circulation. 2010; 121: V948-A165. 5. There are calcifications of the aortic valve. Echocardiographic correlation for evaluation of potential valvular dysfunction may be warranted if clinically indicated. 6. Small volume of pneumoperitoneum noted in the visualized upper abdomen, presumably related to recent gastrojejunostomy performed on 11/12/2017. However, clinical correlation is recommended to exclude any signs are symptoms concerning for perforation.  Aortic Atherosclerosis (ICD10-I70.0).  11/09/17 CT CAP IMPRESSION: 1. Circumferential wall thickening involving the gastric antrum with marked distension of the stomach suggestive of an element of gastric outlet obstruction. Further evaluation with endoscopy is recommended. 2. Short-segment occlusion involving the left lower lobe lateral basal subsegmental bronchus, nonspecific though could be indicative of aspiration in the setting of suspected gastric outlet obstruction. Clinical correlation is advised. 3. Small amount of fluid seen with the pelvic cul-de-sac, presumably reactive. 4. Mild ectasia of the abdominal aorta measuring 2.7 cm in diameter. Recommend follow-up aortic ultrasound in 5 years. This recommendation follows ACR consensus guidelines: White  Paper of the ACR Incidental Findings Committee II on Vascular Findings. J Am Coll Radiol 2013; 17:793-903. 5. Aortic Atherosclerosis (ICD10-I70.0). Suspected hemodynamically significant stenoses involving the bilateral external iliac arteries and the right common femoral artery. Correlation for lower extremity PAD symptoms is recommended. Further evaluation with the acquisition of ABIs could be performed as indicated.   ASSESSMENT & PLAN: 83 y.o. male with PMH of hypertension, but otherwise healthy, a retired Programmer, applications, presented with abdominal pain, nausea, anorexia and weight loss.  1.  Gastric adenocarcinoma with peritoneal metastasis, stage IV 2.  Gastric outlet syndrome from cancer, status post gastro jejunostomy and PEG placement  3.  Severe malnutrition, status post J-tube placement for nutrition 4.  Severe iron deficient anemia, improved, status post blood transfusion and IV Feraheme X2 5. Hypertension  6. Goal of care discussion   Ms. Rideaux appears stable today. He continues to recover from hospitalization and gastrojejunostomy, PEG, and J tube placement. He is still severely malnourished. He continues J tube feeding at 50 m//hr for 16 total hours per day, broken up into 8 hour increments; he is to increase rate as tolerated. Home care assists with this.  He saw Dr. Marcello Moores 12/03/17 who instructed him to advance to liquid diet, we encouraged this. Heart rate is mildly tachycardic, 112, with dry mucous membranes; we also encouraged him to increase water intake po and via J tube. We discussed venting PEG tube PRN if he becomes symptomatic with bloating or abdominal discomfort. Placed urgent dietician referral to establish care and monitor closely. For mild nausea and constipation I prescribed zofran ODT and colace PRN, prescription given to patient today.   We again reviewed imaging, surgical findings, and pathology results. We reviewed that his cancer is not curable at this stage and the goal of therapy is to prolong his life and improve quality of life. He asked about the role of hospice; he is interested in pursing cancer treatment; hospice is an option if he does not tolerate treatment or wishes to stop therapy. Dr. Burr Medico recommends palliative 5FU only with cycle 1 due to his age and poor nutritional status. If he tolerates first cycle, will consider adding oxaliplatin subsequently. He has good baseline health, he may tolerate well. We discussed side effects of 5FU/FOLFOX including but not limited  to fatigue, mucositis, nausea, vomiting, diarrhea, hair loss, neuropathy, cold sensitivity, fluid retention, renal and kidney dysfunction, neutropenic fever, needed for blood transfusion, bleeding with patient in great detail. He agrees to proceed. He will attend chemo class. PAC has been placed. PDL-1 and MSI results pending.  He received IV Feraheme x2 in the hospital, Hgb 10.6 at discharge. Will check iron studies qMonthly. Dr. Burr Medico discussed the above with patient's son who is a physician in [place]. His ultimate plan is to move his parents in with him, but the time frame is not known. I placed referral to social work to establish with patient to aid in any social support, transportation, or other needs of the patient. We attempted to use video interpreter during this encounter but the patient declined. He voiced good understanding of the plan.   Plan to begin 5FU only in 1 - 2 weeks, he will return for lab and f/u prior to first cycle.      Orders Placed This Encounter  Procedures  . CBC & Diff and Retic    Standing Status:   Standing    Number of Occurrences:   100    Standing Expiration  Date:   12/04/2022  . Comprehensive metabolic panel    Standing Status:   Standing    Number of Occurrences:   100    Standing Expiration Date:   12/04/2022  . CEA    Standing Status:   Future    Standing Expiration Date:   12/04/2018  . Ferritin    Standing Status:   Standing    Number of Occurrences:   100    Standing Expiration Date:   12/04/2018  . Iron and TIBC    Standing Status:   Standing    Number of Occurrences:   100    Standing Expiration Date:   12/04/2018  . Ambulatory referral to Nutrition and Diabetic E    Referral Priority:   Urgent    Referral Type:   Consultation    Referral Reason:   Specialty Services Required    Number of Visits Requested:   1  . Ambulatory referral to Social Work    Referral Priority:   Routine    Referral Type:   Consultation    Referral Reason:    Specialty Services Required    Number of Visits Requested:   1   All questions were answered. The patient knows to call the clinic with any problems, questions or concerns. No barriers to learning was detected. I spent 45 minutes counseling the patient face to face. The total time spent in the appointment was 60 minutes and more than 50% was on counseling, review of test results, and coordination of care.     [name], NP 12/04/17   Addendum I have seen the patient, examined him. I agree with the assessment and and plan and have edited the notes.   Mr. Buice returns to my clinic for follow-up.  I first saw him in the hospital last week when he was initially diagnosed with metastatic gastric cancer.  Patient refuses to use interpreter service, I was not very sure if he completely understood our discussion, given the complexity of his diagnosis and management options.  With patient's permission and request, I called his son Cristie Hem, who is also a physician, during his visit, and Cristie Hem was able to translate most of my discussion and recommendations to his parents. Cristie Hem was concerned about his father's quality of life with chemo, the level of support he may need during chemo.  Although patient is elderly and malnourished, he had great baseline health, and was quite active before he got sick. I think he is a candidate for low intensity chemo, such as dose reduced FOLFOX or 5-fu alone. The palliative goal of care was discussed with patient and his son in great detail, he voiced good understanding.  After a lengthy discussion, patient agrees to start chemo in 2 weeks, first cycle will be dose reduced 5-FU pump infusion alone, if he tolerates well, I will consider adding low-dose oxaliplatin.  Potential side effects were discussed with him in detail, he agrees to proceed. Chemo class will be arranged.  EMLA cream and antinausea medication were called in today.  Nutrition and social work consult will be  arranged. Requested PD-L1 and HER2 testing on biopsy, results still pending.  [name]  12/04/2017

## 2017-12-04 NOTE — Telephone Encounter (Signed)
Gave avs and calendar for December  °

## 2017-12-04 NOTE — Progress Notes (Signed)
START ON PATHWAY REGIMEN - Gastroesophageal     A cycle is every 14 days:     Oxaliplatin      Leucovorin      5-Fluorouracil      5-Fluorouracil   **Always confirm dose/schedule in your pharmacy ordering system**    Patient Characteristics: Distant Metastases (cM1/pM1) / Locally Recurrent Disease, Adenocarcinoma - Esophageal, [place], and Gastric, First Line, HER2 Negative / Unknown Histology: Adenocarcinoma Disease Classification: Gastric Therapeutic Status: Distant Metastases (No Additional Staging) Would you be surprised if this patient died  in the next year<= I would NOT be surprised if this patient died in the next year Line of Therapy: First Line HER2 Status: Awaiting Test Results Intent of Therapy: Non-Curative / Palliative Intent, Discussed with Patient

## 2017-12-06 DIAGNOSIS — D62 Acute posthemorrhagic anemia: Secondary | ICD-10-CM | POA: Diagnosis not present

## 2017-12-06 DIAGNOSIS — E43 Unspecified severe protein-calorie malnutrition: Secondary | ICD-10-CM | POA: Diagnosis not present

## 2017-12-06 DIAGNOSIS — K2970 Gastritis, unspecified, without bleeding: Secondary | ICD-10-CM | POA: Diagnosis not present

## 2017-12-06 DIAGNOSIS — Z431 Encounter for attention to gastrostomy: Secondary | ICD-10-CM | POA: Diagnosis not present

## 2017-12-06 DIAGNOSIS — C168 Malignant neoplasm of overlapping sites of stomach: Secondary | ICD-10-CM | POA: Diagnosis not present

## 2017-12-06 DIAGNOSIS — K210 Gastro-esophageal reflux disease with esophagitis: Secondary | ICD-10-CM | POA: Diagnosis not present

## 2017-12-11 ENCOUNTER — Encounter: Payer: Self-pay | Admitting: General Practice

## 2017-12-11 ENCOUNTER — Ambulatory Visit: Payer: Medicare Other | Admitting: Nutrition

## 2017-12-11 ENCOUNTER — Other Ambulatory Visit: Payer: Medicare Other

## 2017-12-11 ENCOUNTER — Encounter: Payer: Self-pay | Admitting: *Deleted

## 2017-12-11 NOTE — Progress Notes (Signed)
[facility] CSW Progress Note  CSW received referral from MD requesting the social work contact patient to "establish with patient to aid in any social support, transportation, or other needs of the patient."  CSW called patient at number listed, unable to reach patient, left VM and requested call back.  MD suggested that [place] call son to discuss possible needs, called son Cristie Hem at [phone].  Per son, patient is driving and son feels this may not be in patient's best interest.  Son interested in having father referred to [facility] for volunteer drivers as well as presented w information on [facility] transport.  Son aware that home health was arranged during inpatient stay - was encouraged to follow up w agency to determine their assessment of patient's in home needs.  Son states that parents have limited support here in [place] and plan is to move them closer within two months - sons Cristie Hem and Will alternate coming to [place] on weekends in order to monitor needs of parents as well as provide support as needed.  CSW agreed to send son information on transportation options, home health aides, and senior support resources in [place].  Edwyna Shell, LCSW Clinical Social Worker Phone:  [phone]

## 2017-12-11 NOTE — Progress Notes (Signed)
83 year old male diagnosed with metastatic gastric cancer with gastric outlet obstruction. He is a patient of Dr. Burr Medico.  Past medical history includes hypertension and hyperglycemia.  Medications include Colace, Zofran, and Compazine.  Labs were reviewed.  Height: 5 feet 9 inches. Weight: 122 pounds. Usual body weight: 140 pounds per patient. BMI: 18.02.  Estimated nutrition needs: 2000-2200 cal, 75-90 grams protein, 2.2 L fluid.  I met with patient and his wife.  Patient has declined interpreter and says he does not need one. Patient is status post gastrojejunostomy with insertion of G-tube and J-tube. Patient reports he is infusing Osmolite 1.5 via jejunostomy tube at 65 mL an hour for 8 hours during the day and 60 mL an hour overnight for 8 hours.  Patient takes 4 hours off continuous feeding twice a day. Current tube feeding providing 1500 cal and 62.7 g protein, and 762 mL free water.  This is 75% minimum calorie needs an 83% minimum protein needs Patient expresses desire to gain weight. He denies nutrition impact symptoms. Also reports he can drink one bottle ensure 3 or 4 days weekly.  He reports it is too sweet.  Nutrition diagnosis:  Inadequate enteral nutrition infusion related to infusion volume not reached and scheduled for infusion interrupted as evidenced by current tube feeding providing approximately 75% energy intake.  Intervention: Educated patient to increase Osmolite 1.5 via jejunostomy tube to 75 mL an hour for 8 hours during the day and monitor tolerance.  This would provide an additional 120 cal daily.  Goal to slowly increase tube feeding to meet greater than 90% of estimated minimum needs. Encouraged patient to increase Ensure Plus to one bottle daily and provided education on ways to decrease sweetness of oral nutrition supplement. Patient able to repeat new tube feeding goal/teach back method used. Provided contact information.  Monitoring, evaluation,  goals: Patient will tolerate tube feeding advancement to meet greater than 90% of estimated nutrition needs.  Next visit: Wednesday, December 19, during infusion.  **Disclaimer: This note was dictated with voice recognition software. Similar sounding words can inadvertently be transcribed and this note may contain transcription errors which may not have been corrected upon publication of note.**

## 2017-12-12 DIAGNOSIS — E43 Unspecified severe protein-calorie malnutrition: Secondary | ICD-10-CM | POA: Diagnosis not present

## 2017-12-12 DIAGNOSIS — Z431 Encounter for attention to gastrostomy: Secondary | ICD-10-CM | POA: Diagnosis not present

## 2017-12-12 DIAGNOSIS — D62 Acute posthemorrhagic anemia: Secondary | ICD-10-CM | POA: Diagnosis not present

## 2017-12-12 DIAGNOSIS — C168 Malignant neoplasm of overlapping sites of stomach: Secondary | ICD-10-CM | POA: Diagnosis not present

## 2017-12-12 DIAGNOSIS — K210 Gastro-esophageal reflux disease with esophagitis: Secondary | ICD-10-CM | POA: Diagnosis not present

## 2017-12-12 DIAGNOSIS — K2970 Gastritis, unspecified, without bleeding: Secondary | ICD-10-CM | POA: Diagnosis not present

## 2017-12-13 DIAGNOSIS — Z431 Encounter for attention to gastrostomy: Secondary | ICD-10-CM | POA: Diagnosis not present

## 2017-12-13 DIAGNOSIS — E43 Unspecified severe protein-calorie malnutrition: Secondary | ICD-10-CM | POA: Diagnosis not present

## 2017-12-13 DIAGNOSIS — K210 Gastro-esophageal reflux disease with esophagitis: Secondary | ICD-10-CM | POA: Diagnosis not present

## 2017-12-13 DIAGNOSIS — C168 Malignant neoplasm of overlapping sites of stomach: Secondary | ICD-10-CM | POA: Diagnosis not present

## 2017-12-13 DIAGNOSIS — D62 Acute posthemorrhagic anemia: Secondary | ICD-10-CM | POA: Diagnosis not present

## 2017-12-13 DIAGNOSIS — K2970 Gastritis, unspecified, without bleeding: Secondary | ICD-10-CM | POA: Diagnosis not present

## 2017-12-16 NOTE — Progress Notes (Signed)
[name]  [phone] [phone]) [phone]  Clinic Follow up Note   Patient Care Team: [name], MD as PCP - General (Internal Medicine) [name], MD as Consulting Physician (General Surgery) [name], MD as Consulting Physician (Hematology) [name], NP as Nurse Practitioner (Nurse Practitioner)   Date of Service:  12/18/2017  CHIEF COMPLAINT: F/u metastatic gastric cancer   SUMMARY OF ONCOLOGIC HISTORY:   Gastric cancer ([name])   11/09/2017 Imaging    CT A/P IMPRESSION: 1. Circumferential wall thickening involving the gastric antrum with marked distension of the stomach suggestive of an element of gastric outlet obstruction. Further evaluation with endoscopy is recommended. 2. Short-segment occlusion involving the left lower lobe lateral basal subsegmental bronchus, nonspecific though could be indicative of aspiration in the setting of suspected gastric outlet obstruction. Clinical correlation is advised. 3. Small amount of fluid seen with the pelvic cul-de-sac, presumably reactive. 4. Mild ectasia of the abdominal aorta measuring 2.7 cm in diameter. Recommend follow-up aortic ultrasound in 5 years. This recommendation follows ACR consensus guidelines: White Paper of the ACR Incidental Findings Committee II on Vascular Findings. J Am Coll Radiol 2013; 96:759-163. 5. Aortic Atherosclerosis (ICD10-I70.0). Suspected hemodynamically significant stenoses involving the bilateral external iliac arteries and the right common femoral artery. Correlation for lower extremity PAD symptoms is recommended. Further evaluation with the acquisition of ABIs could be performed as indicated.      11/10/2017 Procedure    Upper endoscopy Findings: per Dr. Hilarie Fredrickson Mild esophagitis with no bleeding was found in the lower third of the esophagus. This is likely from reflux in the setting of partial gastric outlet obstruction. Retained fluid and food  debris was found in the gastric body. This was aspirated via the suction channel of the endoscope but was unable to be fully cleared due to semi-solid material. 1800 mL was removed. Diffuse moderate inflammation characterized by erythema and granularity was found in the cardia, in the gastric fundus and in the gastric body. A large, fungating and friable mass with no active bleeding was found at the incisura and in the gastric antrum. The mass was covered with mucus. This was biopsied extensively with a cold forceps for histology. Attempt made to maneuver around this mass and find the pylorus/duodenum, however given retained food/food in the stomach and lack of airway protection, this was not possible. An examination of the duodenum was not performed.      11/10/2017 Pathology Results    Diagnosis Stomach, biopsy, gastric mass - ADENOCARCINOMA - SEE COMMENT  Results: HER2 - NEGATIVE RATIO OF HER2/CEP17 SIGNALS 1.92 AVERAGE HER2 COPY NUMBER PER CELL 2.45  Microscopic Comment Based on the biopsy the carcinoma is well-differentiated. Dr. Gari Crown reviewed the case and agrees with the above diagnosis. Dr. Hilarie Fredrickson was notified of these results on November 12, 2017. Her-2 is pending and will be reported in an addendum. Warthin-Starry is negative for H. pylori. 1 of      11/12/2017 Pathology Results    Diagnosis Peritoneum, biopsy - METASTATIC ADENOCARCINOMA. Microscopic Comment The morphologic appearance is similar to previously diagnosed gastric adenocarcinoma ([id number]). (BNS:ah/gt, 11/13/17)      11/12/2017 Surgery    PROCEDURE:   Diagnostic Laparoscopy with biopsy, gastojejunostomy, insertion of G-tube and J-tube   Surgeon(s): [name], MD        70/17/7939 Initial Diagnosis    Gastric cancer ([name])      11/19/2017 Imaging    CT CHEST IMPRESSION: 1. No evidence of metastatic disease to  the thorax. 2. Sequela of old granulomatous disease, as above. 3. Aortic  atherosclerosis, in addition to left main and 3 vessel coronary artery disease. 4. Ectasia of the ascending thoracic aorta (4 cm in diameter). Recommend annual imaging followup by CTA or MRA. This recommendation follows 2010 ACCF/AHA/AATS/ACR/ASA/SCA/SCAI/SIR/STS/SVM Guidelines for the Diagnosis and Management of Patients with Thoracic Aortic Disease. Circulation. 2010; 121: P824-M353. 5. There are calcifications of the aortic valve. Echocardiographic correlation for evaluation of potential valvular dysfunction may be warranted if clinically indicated. 6. Small volume of pneumoperitoneum noted in the visualized upper abdomen, presumably related to recent gastrojejunostomy performed on 11/12/2017. However, clinical correlation is recommended to exclude any signs are symptoms concerning for perforation.      11/20/2017 Procedure    PAC placement      12/17/2017 -  Chemotherapy    FOLFOX every 2 weeks for 6 cycles starting 12/191/8        History of present illness:    Lee Pratt was seen in the hospital due to symptomatic anemia and abdominal pain. He reports that he initially had symptoms of decreased appetite, weight loss of 10-20 lbs, and intermittent abdominal pain. Wife reports the patient having emesis. He reports that his energy levels in the past couple of months has declined. Wife reports that the patient has had generalized weakness. He notes that for 2 months he has had these symptoms and that prior to that his energy level was good and he used to walk everyday. He denies doing anything else besides walking prior to his recent diagnosis. Wife reports that the patient has a hx of HTN and only takes ASA. He denies MI or any other medical issues. 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 He has two children who live in [place]. Wife states that they moved to [place] due to the cold weather and they do not know any family members locally. Wife reports that  they go to church, but they do not drive.   He had a CT AP completed on 11/09/2017 with results of: IMPRESSION: 1. Circumferential wall thickening involving the gastric antrum with marked distension of the stomach suggestive of an element of gastric outlet obstruction. Further evaluation with endoscopy is recommended. 2. Short-segment occlusion involving the left lower lobe lateral basal subsegmental bronchus, nonspecific though could be indicative of aspiration in the setting of suspected gastric outlet obstruction. Clinical correlation is advised. 3. Small amount of fluid seen with the pelvic cul-de-sac, presumably reactive. 4. Mild ectasia of the abdominal aorta measuring 2.7 cm in diameter. Recommend follow-up aortic ultrasound in 5 years. This recommendation follows ACR consensus guidelines: White Paper of the ACR Incidental Findings Committee II on Vascular Findings. J Am Coll Radiol 2013; 61:443-154. 5. Aortic Atherosclerosis (ICD10-I70.0). Suspected hemodynamically significant stenoses involving the bilateral external iliac arteries and the right common femoral artery. Correlation for lower extremity PAD symptoms is recommended. Further evaluation with the acquisition of ABIs could be performed as indicated.  He was evaluated in [facility] and admitted for abdominal pain, anemia, and partial gastric outlet obstruction on 11/09/2017. He also had weight loss and dark colored stool during his ED visit on 11/09/2017 prior to being admitted to the hospital. He had an EGD completed on 11/10/2017 which was performed by Dr. Zenovia Jarred with pathology results showing: adenocarcinoma of gastric mass. Subsequently, he had a diagnostic larproscopic with biopsy, gastrojejunostomy, insertion of G and J tube performed by Dr. [name] on 00/86/7619  with pathology results of peritoneum, biopsy with metastatic adenocarcinoma. During his admission, he has received IV feraheme twice on 11/12/2017 and 11/15/2017 due to  symptomatic anemia.     CURRENT THERAPY: first line FOLFOX every 2 weeks starting 12/191/8     INTERVAL HISTORY:  Mr. Kerth returns is here for a follow up and first cycle chemo. He presents to the clinic today accompanied by his wife.  He notes he is doing better. He gained some weight. He feels much stronger. He came in for his chemo class and his port was placed. He feels ready to start chemo today. He does not want to contact his son during visit today. He was given 2 types of zofran and wanted to know why. He is not eating much by mouth. He is mostly tube feeding or taking ensure boost. He notes he is hard of hearing and would like to conitnue without an interpreter.      REVIEW OF SYSTEMS:   Constitutional: Denies fevers, chills (+) weight gain  Ears, nose, mouth, throat, and face: Denies mucositis or sore throat (+) hard of hearing  Respiratory: Denies dyspnea or wheezes (+) dry cough Cardiovascular: Denies palpitation, chest discomfort or lower extremity swelling Gastrointestinal:  Denies emesis, diarrhea, dysphagia, bloating, early satiety, heartburn, abdominal pain, or blood in stool  (+) RLQ PEG and J tube  Skin: Denies abnormal skin rashes Lymphatics: Denies new lymphadenopathy or easy bruising Neurological:Denies numbness, tingling or new weaknesses Behavioral/Psych: Mood is stable, no new changes  All other systems were reviewed with the patient and are negative.  MEDICAL HISTORY:  Past Medical History:  Diagnosis Date  . Hyperglycemia 03/16/2013  . Hypertension   . Stomach cancer [facility])     SURGICAL HISTORY: Past Surgical History:  Procedure Laterality Date  . ESOPHAGOGASTRODUODENOSCOPY N/A 11/10/2017   Procedure: ESOPHAGOGASTRODUODENOSCOPY (EGD);  Surgeon: Jerene Bears, MD;  Location: [facility];  Service: Gastroenterology;  Laterality: N/A;  . GASTROJEJUNOSTOMY N/A 11/12/2017   Procedure: Diagnostic Laparoscopy with biopsy, gastojejunostomy, insertion of G-tube  and J-tube;  Surgeon: [name], MD;  Location: [facility];  Service: General;  Laterality: N/A;  . NO PAST SURGERIES  12/21/2011  . PORTACATH PLACEMENT Left 11/20/2017   Procedure: INSERTION PORT-A-CATH;  Surgeon: Ralene Ok, MD;  Location: [facility];  Service: General;  Laterality: Left;    I have reviewed the social history and family history with the patient and they are unchanged from previous note.  ALLERGIES:  has No Known Allergies.  MEDICATIONS:  Current Outpatient Medications  Medication Sig Dispense Refill  . aspirin EC 81 MG tablet Take 81 mg by mouth daily.    Marland Kitchen CARTIA XT 120 MG 24 hr capsule Take 120 mg daily as needed by mouth (Systolic BP > 665/99).     . docusate sodium (COLACE) 100 MG capsule Take 1 capsule (100 mg total) by mouth 2 (two) times daily as needed for mild constipation. 30 capsule 1  . lidocaine-prilocaine (EMLA) cream Apply to affected area once 30 g 3  . ondansetron (ZOFRAN ODT) 8 MG disintegrating tablet Take 1 tablet (8 mg total) by mouth 2 (two) times daily as needed for nausea or vomiting. 20 tablet 1  . ondansetron (ZOFRAN) 8 MG tablet Take 1 tablet (8 mg total) by mouth 2 (two) times daily as needed for refractory nausea / vomiting. Start on day 3 after chemotherapy. 30 tablet 1  . prochlorperazine (COMPAZINE) 10 MG tablet Take 1 tablet (10 mg total) by mouth  every 6 (six) hours as needed (Nausea or vomiting). (Patient not taking: Reported on 12/18/2017) 30 tablet 1   No current facility-administered medications for this visit.     PHYSICAL EXAMINATION: ECOG PERFORMANCE STATUS: 2 - Symptomatic, <50% confined to bed  Vitals:   12/18/17 1007  BP: 116/60  Pulse: 87  Resp: 20  Temp: 97.8 F (36.6 C)  SpO2: 100%   Filed Weights   12/18/17 1007  Weight: 125 lb 3.2 oz (56.8 kg)    GENERAL:alert, no distress and comfortable SKIN: skin color, texture are normal, no rashes or significant lesions (+) dry mucous membranes  EYES: normal,  Conjunctiva are pink and non-injected, sclera clear OROPHARYNX:no exudate, no erythema and lips, buccal mucosa, and tongue normal  NECK: supple, thyroid normal size, non-tender, without nodularity LYMPH:  no palpable cervical, supra/infraclavicular, axillary, or inguinal lymphadenopathy  LUNGS: clear to auscultation bilaterally with normal breathing effort HEART: regular rate & rhythm and no murmurs and no lower extremity edema ABDOMEN:abdomen soft, non-tender and normal bowel sounds (+) midline abdominal incision healed well, no erythema or discharge (+) RLQ PEG and J tube, no drainage or erythema Musculoskeletal:no cyanosis of digits and no clubbing  NEURO: alert & oriented x 3 with fluent speech, no focal motor/sensory deficits PAC without erythema   LABORATORY DATA:  I have reviewed the data as listed CBC Latest Ref Rng & Units 12/18/2017 11/20/2017 11/18/2017  WBC 4.0 - 10.3 10e3/uL 8.1 7.8 6.2  Hemoglobin 13.0 - 17.1 g/dL 11.1(L) 10.6(L) 10.0(L)  Hematocrit 38.4 - 49.9 % 34.1(L) 33.1(L) 31.3(L)  Platelets 140 - 400 10e3/uL 281 400 359     CMP Latest Ref Rng & Units 12/18/2017 11/20/2017 11/18/2017  Glucose 70 - 140 mg/dl 103 112(H) 127(H)  BUN 7.0 - 26.0 mg/dL 21.2 41(H) 27(H)  Creatinine 0.7 - 1.3 mg/dL 0.8 0.88 0.74  Sodium 136 - 145 mEq/L 135(L) 140 140  Potassium 3.5 - 5.1 mEq/L 4.2 3.9 3.6  Chloride 101 - 111 mmol/L - 106 110  CO2 22 - 29 mEq/L '27 26 24  '$ Calcium 8.4 - 10.4 mg/dL 8.7 8.9 8.6(L)  Total Protein 6.4 - 8.3 g/dL 6.7 - -  Total Bilirubin 0.20 - 1.20 mg/dL 0.37 - -  Alkaline Phos 40 - 150 U/L 80 - -  AST 5 - 34 U/L 35(H) - -  ALT 0 - 55 U/L 33 - -   11/12/17 Diagnosis Peritoneum, biopsy - METASTATIC ADENOCARCINOMA  11/10/17 Diagnosis Stomach, biopsy, gastric mass - ADENOCARCINOMA - SEE COMMENT HER2 - NEGATIVE   RADIOGRAPHIC STUDIES: I have personally reviewed the radiological images as listed and agreed with the findings in the report. No results  found.   11/19/17 CT CHEST IMPRESSION: 1. No evidence of metastatic disease to the thorax. 2. Sequela of old granulomatous disease, as above. 3. Aortic atherosclerosis, in addition to left main and 3 vessel coronary artery disease. 4. Ectasia of the ascending thoracic aorta (4 cm in diameter). Recommend annual imaging followup by CTA or MRA. This recommendation follows 2010 ACCF/AHA/AATS/ACR/ASA/SCA/SCAI/SIR/STS/SVM Guidelines for the Diagnosis and Management of Patients with Thoracic Aortic Disease. Circulation. 2010; 121: U131-Y388. 5. There are calcifications of the aortic valve. Echocardiographic correlation for evaluation of potential valvular dysfunction may be warranted if clinically indicated. 6. Small volume of pneumoperitoneum noted in the visualized upper abdomen, presumably related to recent gastrojejunostomy performed on 11/12/2017. However, clinical correlation is recommended to exclude any signs are symptoms concerning for perforation.  Aortic Atherosclerosis (ICD10-I70.0).  11/09/17 CT CAP  IMPRESSION: 1. Circumferential wall thickening involving the gastric antrum with marked distension of the stomach suggestive of an element of gastric outlet obstruction. Further evaluation with endoscopy is recommended. 2. Short-segment occlusion involving the left lower lobe lateral basal subsegmental bronchus, nonspecific though could be indicative of aspiration in the setting of suspected gastric outlet obstruction. Clinical correlation is advised. 3. Small amount of fluid seen with the pelvic cul-de-sac, presumably reactive. 4. Mild ectasia of the abdominal aorta measuring 2.7 cm in diameter. Recommend follow-up aortic ultrasound in 5 years. This recommendation follows ACR consensus guidelines: White Paper of the ACR Incidental Findings Committee II on Vascular Findings. J Am Coll Radiol 2013; 03:546-568. 5. Aortic Atherosclerosis (ICD10-I70.0). Suspected  hemodynamically significant stenoses involving the bilateral external iliac arteries and the right common femoral artery. Correlation for lower extremity PAD symptoms is recommended. Further evaluation with the acquisition of ABIs could be performed as indicated.   ASSESSMENT & PLAN: 83 y.o. male with PMH of hypertension, but otherwise healthy, a retired Licensed conveyancer, presented with abdominal pain, nausea, anorexia and weight loss.  1.  Gastric adenocarcinoma with peritoneal metastasis, stage IV -We previously reviewed imaging, surgical findings, and pathology results. We reviewed that his cancer is not curable at this stage and the goal of therapy is to prolong his life and improve quality of life. -He received IV Feraheme x2 in the hospital, anemia improved. Will check iron studies every Month. -Patient refuses to use interpreter service, I was not very sure if he completely understood our discussion, given the complexity of his diagnosis and management options. I have spoken with his son Cristie Hem, who is also a physician to help him understand his diagnosis, prognosis and treatment  -I discussed his FO genomic testing results. His PDL-1 results show CPS 0, HER2 Negative and MS-Stable. He is not an immunotherapy candidate at this time, and would not benefit from Herceptin   -He has recovered well since hospital discharge, gained weight back, overall feels well, ready to start chemo. -I recommend systemic chemotherapy first-line FOLFOX.  Also he is elderly, he had good baseline health, as doing better with nutrition supplement and tube feeds, plan to give first cycle of FOLFOX with dose reduction.  If he tolerates well, will consider for dose FOLFOX, will omit 5-FU bolus. --Chemotherapy consent: Side effects including but does not not limited to, fatigue, nausea, vomiting, diarrhea, hair loss, neuropathy, fluid retention, renal and kidney dysfunction, neutropenic fever, needed for blood  transfusion, bleeding, coronary artery spasm and a heart attack, were discussed with patient in great detail. He agrees to proceed. -Labs reviewed, mild anemia, ALT at 35, but adequate to proceed with first cycle FOLFOX today. -I again asked him if he would like an interpretor, he declined again.  -F/u in 2 weeks    2.  Gastric outlet syndrome from cancer, status post gastro jejunostomy and PEG placement  -We discussed venting PEG tube PRN if he becomes symptomatic with bloating or abdominal discomfort. Placed urgent dietician referral to establish care and monitor closely.  -For mild nausea and constipation he was prescribed zofran ODT and colace PRN, prescription given to patient on 12/5 -I reviewed how to use zofran if he experiences nausea.    3.  Severe malnutrition, status post J-tube placement for nutrition -He is still severely malnourished. He continues J tube feeding at 50 m//hr for 16 total hours per day, broken up into 8 hour increments; he is to increase rate as tolerated. Home care assists with this.  -  He saw Dr. Marcello Moores 12/03/17 who instructed him to advance to liquid diet, we encouraged this. -He has gained some weight back. I encouraged him to continue liquid diet, tube feeding and supplement drink   4.  Severe iron deficient anemia - improved, status post blood transfusion and IV Feraheme X2 -Iron study from 12/18/17 show Hg at 11.1, Ferritin at 394, iron at 28 and Sat at 10%. -We will continue monitor his iron level   5. Hypertension  -he is on cartia  -We discussed the potential impact on his blood pressure from chemotherapy, will monitor closely  6. Goal of care discussion  -We again discussed the incurable nature of his/her cancer, and the overall poor prognosis, especially if he/she does not have good response to chemotherapy or progress on chemo -The patient understands the goal of care is palliative. -I recommend DNR/DNI, he will think about it -Son lives in  [place] and the son ultimately plans to move his parents in with him. In the meantime pt needs assistance  -Social work referral has been sent.    Plan: -Labs reviewed and adequate to proceed with FOLFOX today at reduced dose  -Lab, flush, f/u and chemo FOLFOX in 2, 4 and 6 weeks  -will update his son about his condition and treatment    No orders of the defined types were placed in this encounter.  All questions were answered. The patient knows to call the clinic with any problems, questions or concerns. No barriers to learning was detected.  I spent 25 minutes counseling the patient face to face. The total time spent in the appointment was 30 minutes and more than 50% was on counseling, review of test results, and coordination of care.   [name]  12/18/2017   This document serves as a record of services personally performed by [name], MD. It was created on her behalf by Joslyn Devon, a trained medical scribe. The creation of this record is based on the scribe's personal observations and the provider's statements to them.    I have reviewed the above documentation for accuracy and completeness, and I agree with the above.

## 2017-12-17 DIAGNOSIS — D62 Acute posthemorrhagic anemia: Secondary | ICD-10-CM | POA: Diagnosis not present

## 2017-12-17 DIAGNOSIS — E43 Unspecified severe protein-calorie malnutrition: Secondary | ICD-10-CM | POA: Diagnosis not present

## 2017-12-17 DIAGNOSIS — K2970 Gastritis, unspecified, without bleeding: Secondary | ICD-10-CM | POA: Diagnosis not present

## 2017-12-17 DIAGNOSIS — C168 Malignant neoplasm of overlapping sites of stomach: Secondary | ICD-10-CM | POA: Diagnosis not present

## 2017-12-17 DIAGNOSIS — K210 Gastro-esophageal reflux disease with esophagitis: Secondary | ICD-10-CM | POA: Diagnosis not present

## 2017-12-17 DIAGNOSIS — Z431 Encounter for attention to gastrostomy: Secondary | ICD-10-CM | POA: Diagnosis not present

## 2017-12-18 ENCOUNTER — Telehealth: Payer: Self-pay | Admitting: Hematology

## 2017-12-18 ENCOUNTER — Encounter: Payer: Medicare Other | Admitting: Nutrition

## 2017-12-18 ENCOUNTER — Ambulatory Visit (HOSPITAL_BASED_OUTPATIENT_CLINIC_OR_DEPARTMENT_OTHER): Payer: Medicare Other

## 2017-12-18 ENCOUNTER — Ambulatory Visit (HOSPITAL_BASED_OUTPATIENT_CLINIC_OR_DEPARTMENT_OTHER): Payer: Medicare Other | Admitting: Hematology

## 2017-12-18 ENCOUNTER — Ambulatory Visit: Payer: Medicare Other | Admitting: Nutrition

## 2017-12-18 ENCOUNTER — Other Ambulatory Visit (HOSPITAL_BASED_OUTPATIENT_CLINIC_OR_DEPARTMENT_OTHER): Payer: Medicare Other

## 2017-12-18 ENCOUNTER — Ambulatory Visit: Payer: Medicare Other

## 2017-12-18 VITALS — BP 116/60 | HR 87 | Temp 97.8°F | Resp 20 | Ht 69.0 in | Wt 125.2 lb

## 2017-12-18 DIAGNOSIS — C786 Secondary malignant neoplasm of retroperitoneum and peritoneum: Secondary | ICD-10-CM

## 2017-12-18 DIAGNOSIS — C169 Malignant neoplasm of stomach, unspecified: Secondary | ICD-10-CM

## 2017-12-18 DIAGNOSIS — C163 Malignant neoplasm of pyloric antrum: Secondary | ICD-10-CM

## 2017-12-18 DIAGNOSIS — D509 Iron deficiency anemia, unspecified: Secondary | ICD-10-CM | POA: Diagnosis not present

## 2017-12-18 DIAGNOSIS — Z7189 Other specified counseling: Secondary | ICD-10-CM

## 2017-12-18 DIAGNOSIS — Z95828 Presence of other vascular implants and grafts: Secondary | ICD-10-CM | POA: Insufficient documentation

## 2017-12-18 DIAGNOSIS — I10 Essential (primary) hypertension: Secondary | ICD-10-CM

## 2017-12-18 DIAGNOSIS — Z5111 Encounter for antineoplastic chemotherapy: Secondary | ICD-10-CM

## 2017-12-18 LAB — CBC & DIFF AND RETIC
BASO%: 0.2 % (ref 0.0–2.0)
Basophils Absolute: 0 10*3/uL (ref 0.0–0.1)
EOS%: 2.5 % (ref 0.0–7.0)
Eosinophils Absolute: 0.2 10*3/uL (ref 0.0–0.5)
HCT: 34.1 % — ABNORMAL LOW (ref 38.4–49.9)
HGB: 11.1 g/dL — ABNORMAL LOW (ref 13.0–17.1)
Immature Retic Fract: 6.4 % (ref 3.00–10.60)
LYMPH#: 1.7 10*3/uL (ref 0.9–3.3)
LYMPH%: 21.6 % (ref 14.0–49.0)
MCH: 29.7 pg (ref 27.2–33.4)
MCHC: 32.6 g/dL (ref 32.0–36.0)
MCV: 91.2 fL (ref 79.3–98.0)
MONO#: 0.7 10*3/uL (ref 0.1–0.9)
MONO%: 8.1 % (ref 0.0–14.0)
NEUT%: 67.6 % (ref 39.0–75.0)
NEUTROS ABS: 5.5 10*3/uL (ref 1.5–6.5)
PLATELETS: 281 10*3/uL (ref 140–400)
RBC: 3.74 10*6/uL — AB (ref 4.20–5.82)
RDW: 19.3 % — ABNORMAL HIGH (ref 11.0–14.6)
RETIC CT ABS: 59.84 10*3/uL (ref 34.80–93.90)
Retic %: 1.6 % (ref 0.80–1.80)
WBC: 8.1 10*3/uL (ref 4.0–10.3)

## 2017-12-18 LAB — COMPREHENSIVE METABOLIC PANEL
ALBUMIN: 3 g/dL — AB (ref 3.5–5.0)
ALK PHOS: 80 U/L (ref 40–150)
ALT: 33 U/L (ref 0–55)
AST: 35 U/L — ABNORMAL HIGH (ref 5–34)
Anion Gap: 7 mEq/L (ref 3–11)
BILIRUBIN TOTAL: 0.37 mg/dL (ref 0.20–1.20)
BUN: 21.2 mg/dL (ref 7.0–26.0)
CALCIUM: 8.7 mg/dL (ref 8.4–10.4)
CO2: 27 mEq/L (ref 22–29)
Chloride: 101 mEq/L (ref 98–109)
Creatinine: 0.8 mg/dL (ref 0.7–1.3)
GLUCOSE: 103 mg/dL (ref 70–140)
POTASSIUM: 4.2 meq/L (ref 3.5–5.1)
SODIUM: 135 meq/L — AB (ref 136–145)
TOTAL PROTEIN: 6.7 g/dL (ref 6.4–8.3)

## 2017-12-18 LAB — FERRITIN: Ferritin: 394 ng/ml — ABNORMAL HIGH (ref 22–316)

## 2017-12-18 LAB — CEA (IN HOUSE-CHCC): CEA (CHCC-IN HOUSE): 3.44 ng/mL (ref 0.00–5.00)

## 2017-12-18 LAB — IRON AND TIBC
%SAT: 10 % — AB (ref 20–55)
Iron: 28 ug/dL — ABNORMAL LOW (ref 42–163)
TIBC: 265 ug/dL (ref 202–409)
UIBC: 237 ug/dL (ref 117–376)

## 2017-12-18 MED ORDER — SODIUM CHLORIDE 0.9 % IV SOLN
2000.0000 mg/m2 | INTRAVENOUS | Status: DC
  Administered 2017-12-18: 3250 mg via INTRAVENOUS
  Filled 2017-12-18: qty 65

## 2017-12-18 MED ORDER — SODIUM CHLORIDE 0.9 % IV SOLN: 10.0000 mg | Freq: Once | INTRAVENOUS | Status: DC

## 2017-12-18 MED ORDER — SODIUM CHLORIDE 0.9% FLUSH
10.0000 mL | INTRAVENOUS | Status: DC | PRN
  Administered 2017-12-18: 10 mL via INTRAVENOUS
  Filled 2017-12-18: qty 10

## 2017-12-18 MED ORDER — LEUCOVORIN CALCIUM INJECTION 350 MG
400.0000 mg/m2 | Freq: Once | INTRAMUSCULAR | Status: AC
  Administered 2017-12-18: 648 mg via INTRAVENOUS
  Filled 2017-12-18: qty 32.4

## 2017-12-18 MED ORDER — PALONOSETRON HCL INJECTION 0.25 MG/5ML
0.2500 mg | Freq: Once | INTRAVENOUS | Status: AC
  Administered 2017-12-18: 0.25 mg via INTRAVENOUS

## 2017-12-18 MED ORDER — DEXAMETHASONE SODIUM PHOSPHATE 10 MG/ML IJ SOLN
10.0000 mg | Freq: Once | INTRAMUSCULAR | Status: AC
  Administered 2017-12-18: 10 mg via INTRAVENOUS

## 2017-12-18 MED ORDER — DEXAMETHASONE SODIUM PHOSPHATE 10 MG/ML IJ SOLN
INTRAMUSCULAR | Status: AC
  Filled 2017-12-18: qty 1

## 2017-12-18 MED ORDER — DEXTROSE 5 % IV SOLN
Freq: Once | INTRAVENOUS | Status: AC
  Administered 2017-12-18: 13:00:00 via INTRAVENOUS

## 2017-12-18 MED ORDER — OXALIPLATIN CHEMO INJECTION 100 MG/20ML
61.0000 mg/m2 | Freq: Once | INTRAVENOUS | Status: AC
  Administered 2017-12-18: 100 mg via INTRAVENOUS
  Filled 2017-12-18: qty 20

## 2017-12-18 MED ORDER — PALONOSETRON HCL INJECTION 0.25 MG/5ML
INTRAVENOUS | Status: AC
  Filled 2017-12-18: qty 5

## 2017-12-18 NOTE — Telephone Encounter (Signed)
Scheduled appt per 12/19 los - unable to schedule 2 week appt due to capped day - logged patient and will contact when appt is scheduled - patient is aware.

## 2017-12-18 NOTE — Progress Notes (Signed)
Nutrition follow-up completed with patient in chemotherapy for metastatic gastric cancer. Weight improved documented as 125.2 pounds December 19 increased from 122 pounds, December 12. Patient is very pleased with weight gain. Reports he is tolerating increased Osmolite 1.5 at 75 mL an hour for 8 hours during the day and 65 mL an hour overnight.   Tube feeding currently providing 1680 cal, 70 g protein, and 853 mL free water. He continues to try to eat as tolerated.  Estimated nutrition needs: 2000-2200 calories, 75-90 grams protein, 2.2 L fluid.  Nutrition diagnosis: Inadequate enteral nutrition infusion improved.  Intervention: Patient educated to continue tube feeding and oral intake to promote weight gain. Will monitor and adjust tube feeding as needed.  Monitoring, evaluation, goals: Patient will tolerate tube feeding and oral intake to promote weight gain.  Next visit: Wednesday, January 16, during infusion.  **Disclaimer: This note was dictated with voice recognition software. Similar sounding words can inadvertently be transcribed and this note may contain transcription errors which may not have been corrected upon publication of note.**

## 2017-12-18 NOTE — Patient Instructions (Signed)
[facility] Discharge Instructions for Patients Receiving Chemotherapy  Today you received the following chemotherapy agents Oxaliplatin, Leucovorin, 5-FU  To help prevent nausea and vomiting after your treatment, we encourage you to take your nausea medication as directed by your doctor.  If you develop nausea and vomiting that is not controlled by your nausea medication, call the clinic.   BELOW ARE SYMPTOMS THAT SHOULD BE REPORTED IMMEDIATELY:  *FEVER GREATER THAN 100.5 F  *CHILLS WITH OR WITHOUT FEVER  NAUSEA AND VOMITING THAT IS NOT CONTROLLED WITH YOUR NAUSEA MEDICATION  *UNUSUAL SHORTNESS OF BREATH  *UNUSUAL BRUISING OR BLEEDING  TENDERNESS IN MOUTH AND THROAT WITH OR WITHOUT PRESENCE OF ULCERS  *URINARY PROBLEMS  *BOWEL PROBLEMS  UNUSUAL RASH Items with * indicate a potential emergency and should be followed up as soon as possible.  Feel free to call the clinic should you have any questions or concerns. The clinic phone number is [phone].  Please show the [facility] at check-in to the Emergency Department and triage nurse.   Oxaliplatin Injection What is this medicine? OXALIPLATIN (ox AL i PLA tin) is a chemotherapy drug. It targets fast dividing cells, like cancer cells, and causes these cells to die. This medicine is used to treat cancers of the colon and rectum, and many other cancers. This medicine may be used for other purposes; ask your health care provider or pharmacist if you have questions. COMMON BRAND NAME(S): Eloxatin What should I tell my health care provider before I take this medicine? They need to know if you have any of these conditions: -kidney disease -an unusual or allergic reaction to oxaliplatin, other chemotherapy, other medicines, foods, dyes, or preservatives -pregnant or trying to get pregnant -breast-feeding How should I use this medicine? This drug is given as an infusion into a vein. It is administered in a  hospital or clinic by a specially trained health care professional. Talk to your pediatrician regarding the use of this medicine in children. Special care may be needed. Overdosage: If you think you have taken too much of this medicine contact a poison control center or emergency room at once. NOTE: This medicine is only for you. Do not share this medicine with others. What if I miss a dose? It is important not to miss a dose. Call your doctor or health care professional if you are unable to keep an appointment. What may interact with this medicine? -medicines to increase blood counts like filgrastim, pegfilgrastim, sargramostim -probenecid -some antibiotics like amikacin, gentamicin, neomycin, polymyxin B, streptomycin, tobramycin -zalcitabine Talk to your doctor or health care professional before taking any of these medicines: -acetaminophen -aspirin -ibuprofen -ketoprofen -naproxen This list may not describe all possible interactions. Give your health care provider a list of all the medicines, herbs, non-prescription drugs, or dietary supplements you use. Also tell them if you smoke, drink alcohol, or use illegal drugs. Some items may interact with your medicine. What should I watch for while using this medicine? Your condition will be monitored carefully while you are receiving this medicine. You will need important blood work done while you are taking this medicine. This medicine can make you more sensitive to cold. Do not drink cold drinks or use ice. Cover exposed skin before coming in contact with cold temperatures or cold objects. When out in cold weather wear warm clothing and cover your mouth and nose to warm the air that goes into your lungs. Tell your doctor if you get sensitive to  the cold. This drug may make you feel generally unwell. This is not uncommon, as chemotherapy can affect healthy cells as well as cancer cells. Report any side effects. Continue your course of treatment  even though you feel ill unless your doctor tells you to stop. In some cases, you may be given additional medicines to help with side effects. Follow all directions for their use. Call your doctor or health care professional for advice if you get a fever, chills or sore throat, or other symptoms of a cold or flu. Do not treat yourself. This drug decreases your body's ability to fight infections. Try to avoid being around people who are sick. This medicine may increase your risk to bruise or bleed. Call your doctor or health care professional if you notice any unusual bleeding. Be careful brushing and flossing your teeth or using a toothpick because you may get an infection or bleed more easily. If you have any dental work done, tell your dentist you are receiving this medicine. Avoid taking products that contain aspirin, acetaminophen, ibuprofen, naproxen, or ketoprofen unless instructed by your doctor. These medicines may hide a fever. Do not become pregnant while taking this medicine. Women should inform their doctor if they wish to become pregnant or think they might be pregnant. There is a potential for serious side effects to an unborn child. Talk to your health care professional or pharmacist for more information. Do not breast-feed an infant while taking this medicine. Call your doctor or health care professional if you get diarrhea. Do not treat yourself. What side effects may I notice from receiving this medicine? Side effects that you should report to your doctor or health care professional as soon as possible: -allergic reactions like skin rash, itching or hives, swelling of the face, lips, or tongue -low blood counts - This drug may decrease the number of white blood cells, red blood cells and platelets. You may be at increased risk for infections and bleeding. -signs of infection - fever or chills, cough, sore throat, pain or difficulty passing urine -signs of decreased platelets or  bleeding - bruising, pinpoint red spots on the skin, black, tarry stools, nosebleeds -signs of decreased red blood cells - unusually weak or tired, fainting spells, lightheadedness -breathing problems -chest pain, pressure -cough -diarrhea -jaw tightness -mouth sores -nausea and vomiting -pain, swelling, redness or irritation at the injection site -pain, tingling, numbness in the hands or feet -problems with balance, talking, walking -redness, blistering, peeling or loosening of the skin, including inside the mouth -trouble passing urine or change in the amount of urine Side effects that usually do not require medical attention (report to your doctor or health care professional if they continue or are bothersome): -changes in vision -constipation -hair loss -loss of appetite -metallic taste in the mouth or changes in taste -stomach pain This list may not describe all possible side effects. Call your doctor for medical advice about side effects. You may report side effects to FDA at 1-800-FDA-1088. Where should I keep my medicine? This drug is given in a hospital or clinic and will not be stored at home. NOTE: This sheet is a summary. It may not cover all possible information. If you have questions about this medicine, talk to your doctor, pharmacist, or health care provider.  2018 Elsevier/Gold Standard (2008-07-13 17:22:47)  Leucovorin injection What is this medicine? LEUCOVORIN (loo koe VOR in) is used to prevent or treat the harmful effects of some medicines. This medicine is used  to treat anemia caused by a low amount of folic acid in the body. It is also used with 5-fluorouracil (5-FU) to treat colon cancer. This medicine may be used for other purposes; ask your health care provider or pharmacist if you have questions. What should I tell my health care provider before I take this medicine? They need to know if you have any of these conditions: -anemia from low levels of vitamin  B-12 in the blood -an unusual or allergic reaction to leucovorin, folic acid, other medicines, foods, dyes, or preservatives -pregnant or trying to get pregnant -breast-feeding How should I use this medicine? This medicine is for injection into a muscle or into a vein. It is given by a health care professional in a hospital or clinic setting. Talk to your pediatrician regarding the use of this medicine in children. Special care may be needed. Overdosage: If you think you have taken too much of this medicine contact a poison control center or emergency room at once. NOTE: This medicine is only for you. Do not share this medicine with others. What if I miss a dose? This does not apply. What may interact with this medicine? -capecitabine -fluorouracil -phenobarbital -phenytoin -primidone -trimethoprim-sulfamethoxazole This list may not describe all possible interactions. Give your health care provider a list of all the medicines, herbs, non-prescription drugs, or dietary supplements you use. Also tell them if you smoke, drink alcohol, or use illegal drugs. Some items may interact with your medicine. What should I watch for while using this medicine? Your condition will be monitored carefully while you are receiving this medicine. This medicine may increase the side effects of 5-fluorouracil, 5-FU. Tell your doctor or health care professional if you have diarrhea or mouth sores that do not get better or that get worse. What side effects may I notice from receiving this medicine? Side effects that you should report to your doctor or health care professional as soon as possible: -allergic reactions like skin rash, itching or hives, swelling of the face, lips, or tongue -breathing problems -fever, infection -mouth sores -unusual bleeding or bruising -unusually weak or tired Side effects that usually do not require medical attention (report to your doctor or health care professional if they  continue or are bothersome): -constipation or diarrhea -loss of appetite -nausea, vomiting This list may not describe all possible side effects. Call your doctor for medical advice about side effects. You may report side effects to FDA at 1-800-FDA-1088. Where should I keep my medicine? This drug is given in a hospital or clinic and will not be stored at home. NOTE: This sheet is a summary. It may not cover all possible information. If you have questions about this medicine, talk to your doctor, pharmacist, or health care provider.  2018 Elsevier/Gold Standard (2008-06-22 16:50:29)   Fluorouracil, 5-FU injection What is this medicine? FLUOROURACIL, 5-FU (flure oh YOOR a sil) is a chemotherapy drug. It slows the growth of cancer cells. This medicine is used to treat many types of cancer like breast cancer, colon or rectal cancer, pancreatic cancer, and stomach cancer. This medicine may be used for other purposes; ask your health care provider or pharmacist if you have questions. COMMON BRAND NAME(S): Adrucil What should I tell my health care provider before I take this medicine? They need to know if you have any of these conditions: -blood disorders -dihydropyrimidine dehydrogenase (DPD) deficiency -infection (especially a virus infection such as chickenpox, cold sores, or herpes) -kidney disease -liver disease -malnourished, poor  nutrition -recent or ongoing radiation therapy -an unusual or allergic reaction to fluorouracil, other chemotherapy, other medicines, foods, dyes, or preservatives -pregnant or trying to get pregnant -breast-feeding How should I use this medicine? This drug is given as an infusion or injection into a vein. It is administered in a hospital or clinic by a specially trained health care professional. Talk to your pediatrician regarding the use of this medicine in children. Special care may be needed. Overdosage: If you think you have taken too much of this  medicine contact a poison control center or emergency room at once. NOTE: This medicine is only for you. Do not share this medicine with others. What if I miss a dose? It is important not to miss your dose. Call your doctor or health care professional if you are unable to keep an appointment. What may interact with this medicine? -allopurinol -cimetidine -dapsone -digoxin -hydroxyurea -leucovorin -levamisole -medicines for seizures like ethotoin, fosphenytoin, phenytoin -medicines to increase blood counts like filgrastim, pegfilgrastim, sargramostim -medicines that treat or prevent blood clots like warfarin, enoxaparin, and dalteparin -methotrexate -metronidazole -pyrimethamine -some other chemotherapy drugs like busulfan, cisplatin, estramustine, vinblastine -trimethoprim -trimetrexate -vaccines Talk to your doctor or health care professional before taking any of these medicines: -acetaminophen -aspirin -ibuprofen -ketoprofen -naproxen This list may not describe all possible interactions. Give your health care provider a list of all the medicines, herbs, non-prescription drugs, or dietary supplements you use. Also tell them if you smoke, drink alcohol, or use illegal drugs. Some items may interact with your medicine. What should I watch for while using this medicine? Visit your doctor for checks on your progress. This drug may make you feel generally unwell. This is not uncommon, as chemotherapy can affect healthy cells as well as cancer cells. Report any side effects. Continue your course of treatment even though you feel ill unless your doctor tells you to stop. In some cases, you may be given additional medicines to help with side effects. Follow all directions for their use. Call your doctor or health care professional for advice if you get a fever, chills or sore throat, or other symptoms of a cold or flu. Do not treat yourself. This drug decreases your body's ability to fight  infections. Try to avoid being around people who are sick. This medicine may increase your risk to bruise or bleed. Call your doctor or health care professional if you notice any unusual bleeding. Be careful brushing and flossing your teeth or using a toothpick because you may get an infection or bleed more easily. If you have any dental work done, tell your dentist you are receiving this medicine. Avoid taking products that contain aspirin, acetaminophen, ibuprofen, naproxen, or ketoprofen unless instructed by your doctor. These medicines may hide a fever. Do not become pregnant while taking this medicine. Women should inform their doctor if they wish to become pregnant or think they might be pregnant. There is a potential for serious side effects to an unborn child. Talk to your health care professional or pharmacist for more information. Do not breast-feed an infant while taking this medicine. Men should inform their doctor if they wish to father a child. This medicine may lower sperm counts. Do not treat diarrhea with over the counter products. Contact your doctor if you have diarrhea that lasts more than 2 days or if it is severe and watery. This medicine can make you more sensitive to the sun. Keep out of the sun. If you cannot avoid being  in the sun, wear protective clothing and use sunscreen. Do not use sun lamps or tanning beds/booths. What side effects may I notice from receiving this medicine? Side effects that you should report to your doctor or health care professional as soon as possible: -allergic reactions like skin rash, itching or hives, swelling of the face, lips, or tongue -low blood counts - this medicine may decrease the number of white blood cells, red blood cells and platelets. You may be at increased risk for infections and bleeding. -signs of infection - fever or chills, cough, sore throat, pain or difficulty passing urine -signs of decreased platelets or bleeding - bruising,  pinpoint red spots on the skin, black, tarry stools, blood in the urine -signs of decreased red blood cells - unusually weak or tired, fainting spells, lightheadedness -breathing problems -changes in vision -chest pain -mouth sores -nausea and vomiting -pain, swelling, redness at site where injected -pain, tingling, numbness in the hands or feet -redness, swelling, or sores on hands or feet -stomach pain -unusual bleeding Side effects that usually do not require medical attention (report to your doctor or health care professional if they continue or are bothersome): -changes in finger or toe nails -diarrhea -dry or itchy skin -hair loss -headache -loss of appetite -sensitivity of eyes to the light -stomach upset -unusually teary eyes This list may not describe all possible side effects. Call your doctor for medical advice about side effects. You may report side effects to FDA at 1-800-FDA-1088. Where should I keep my medicine? This drug is given in a hospital or clinic and will not be stored at home. NOTE: This sheet is a summary. It may not cover all possible information. If you have questions about this medicine, talk to your doctor, pharmacist, or health care provider.  2018 Elsevier/[employer] Standard (2008-04-21 13:53:16)

## 2017-12-19 ENCOUNTER — Encounter: Payer: Self-pay | Admitting: Hematology

## 2017-12-19 ENCOUNTER — Telehealth: Payer: Self-pay | Admitting: *Deleted

## 2017-12-19 NOTE — Telephone Encounter (Signed)
Dr Burr Medico called pt's son & states that pt is doing well.

## 2017-12-19 NOTE — Telephone Encounter (Signed)
-----   Message from [place], RN sent at 12/18/2017  1:02 PM EST ----- Regarding: Dr.Feng, Chemo follow up. Dr.Feng. Chemo follow up. First time oxaliplatin, leucovorin and 10fu.

## 2017-12-20 ENCOUNTER — Telehealth: Payer: Self-pay | Admitting: Hematology

## 2017-12-20 ENCOUNTER — Ambulatory Visit (HOSPITAL_BASED_OUTPATIENT_CLINIC_OR_DEPARTMENT_OTHER): Payer: Medicare Other

## 2017-12-20 VITALS — BP 112/62 | HR 82 | Temp 98.1°F | Resp 20

## 2017-12-20 DIAGNOSIS — C163 Malignant neoplasm of pyloric antrum: Secondary | ICD-10-CM | POA: Diagnosis not present

## 2017-12-20 DIAGNOSIS — Z7189 Other specified counseling: Secondary | ICD-10-CM

## 2017-12-20 MED ORDER — HEPARIN SOD (PORK) LOCK FLUSH 100 UNIT/ML IV SOLN
500.0000 [IU] | Freq: Once | INTRAVENOUS | Status: AC | PRN
  Administered 2017-12-20: 500 [IU]
  Filled 2017-12-20: qty 5

## 2017-12-20 MED ORDER — SODIUM CHLORIDE 0.9% FLUSH
10.0000 mL | INTRAVENOUS | Status: DC | PRN
  Administered 2017-12-20: 10 mL
  Filled 2017-12-20: qty 10

## 2017-12-20 NOTE — Telephone Encounter (Signed)
Added appt for 1/02 - Gave patient calender - patient is aware of appt added.

## 2017-12-26 ENCOUNTER — Telehealth: Payer: Self-pay | Admitting: *Deleted

## 2017-12-26 DIAGNOSIS — C168 Malignant neoplasm of overlapping sites of stomach: Secondary | ICD-10-CM | POA: Diagnosis not present

## 2017-12-26 DIAGNOSIS — K210 Gastro-esophageal reflux disease with esophagitis: Secondary | ICD-10-CM | POA: Diagnosis not present

## 2017-12-26 DIAGNOSIS — D62 Acute posthemorrhagic anemia: Secondary | ICD-10-CM | POA: Diagnosis not present

## 2017-12-26 DIAGNOSIS — K2970 Gastritis, unspecified, without bleeding: Secondary | ICD-10-CM | POA: Diagnosis not present

## 2017-12-26 DIAGNOSIS — Z431 Encounter for attention to gastrostomy: Secondary | ICD-10-CM | POA: Diagnosis not present

## 2017-12-26 DIAGNOSIS — E43 Unspecified severe protein-calorie malnutrition: Secondary | ICD-10-CM | POA: Diagnosis not present

## 2017-12-26 NOTE — Telephone Encounter (Signed)
-----   Message from [place], RN sent at 12/18/2017  1:02 PM EST ----- Regarding: Dr.Feng, Chemo follow up. Dr.Feng. Chemo follow up. First time oxaliplatin, leucovorin and 69fu.

## 2017-12-26 NOTE — Telephone Encounter (Signed)
Left message on vm to call back to discuss any questions, concerns regarding recent chemotherapy.

## 2017-12-27 ENCOUNTER — Encounter: Payer: Self-pay | Admitting: Pharmacist

## 2017-12-27 NOTE — Progress Notes (Signed)
[facility]  [phone] [phone]) [phone]  Clinic Follow up Note   Patient Care Team: [name], MD as PCP - General (Internal Medicine) [name], MD as Consulting Physician (General Surgery) [name], MD as Consulting Physician (Hematology) [name], NP as Nurse Practitioner (Nurse Practitioner)   Date of Service:  01/01/2018  CHIEF COMPLAINT: F/u metastatic gastric cancer   SUMMARY OF ONCOLOGIC HISTORY:   Gastric cancer ([address])   11/09/2017 Imaging    CT A/P IMPRESSION: 1. Circumferential wall thickening involving the gastric antrum with marked distension of the stomach suggestive of an element of gastric outlet obstruction. Further evaluation with endoscopy is recommended. 2. Short-segment occlusion involving the left lower lobe lateral basal subsegmental bronchus, nonspecific though could be indicative of aspiration in the setting of suspected gastric outlet obstruction. Clinical correlation is advised. 3. Small amount of fluid seen with the pelvic cul-de-sac, presumably reactive. 4. Mild ectasia of the abdominal aorta measuring 2.7 cm in diameter. Recommend follow-up aortic ultrasound in 5 years. This recommendation follows ACR consensus guidelines: White Paper of the ACR Incidental Findings Committee II on Vascular Findings. J Am Coll Radiol 2013; 78:242-353. 5. Aortic Atherosclerosis (ICD10-I70.0). Suspected hemodynamically significant stenoses involving the bilateral external iliac arteries and the right common femoral artery. Correlation for lower extremity PAD symptoms is recommended. Further evaluation with the acquisition of ABIs could be performed as indicated.      11/10/2017 Procedure    Upper endoscopy Findings: per Dr. Hilarie Fredrickson Mild esophagitis with no bleeding was found in the lower third of the esophagus. This is likely from reflux in the setting of partial gastric outlet obstruction. Retained fluid and food  debris was found in the gastric body. This was aspirated via the suction channel of the endoscope but was unable to be fully cleared due to semi-solid material. 1800 mL was removed. Diffuse moderate inflammation characterized by erythema and granularity was found in the cardia, in the gastric fundus and in the gastric body. A large, fungating and friable mass with no active bleeding was found at the incisura and in the gastric antrum. The mass was covered with mucus. This was biopsied extensively with a cold forceps for histology. Attempt made to maneuver around this mass and find the pylorus/duodenum, however given retained food/food in the stomach and lack of airway protection, this was not possible. An examination of the duodenum was not performed.      11/10/2017 Pathology Results    Diagnosis Stomach, biopsy, gastric mass - ADENOCARCINOMA - SEE COMMENT  Results: HER2 - NEGATIVE RATIO OF HER2/CEP17 SIGNALS 1.92 AVERAGE HER2 COPY NUMBER PER CELL 2.45  Microscopic Comment Based on the biopsy the carcinoma is well-differentiated. Dr. Gari Crown reviewed the case and agrees with the above diagnosis. Dr. Hilarie Fredrickson was notified of these results on November 12, 2017. Her-2 is pending and will be reported in an addendum. Warthin-Starry is negative for H. pylori. 1 of      11/12/2017 Pathology Results    Diagnosis Peritoneum, biopsy - METASTATIC ADENOCARCINOMA. Microscopic Comment The morphologic appearance is similar to previously diagnosed gastric adenocarcinoma ([id number]). (BNS:ah/gt, 11/13/17)      11/12/2017 Surgery    PROCEDURE:   Diagnostic Laparoscopy with biopsy, gastojejunostomy, insertion of G-tube and J-tube   Surgeon(s): [name], MD        08/67/6195 Initial Diagnosis    Gastric cancer ([place])      11/19/2017 Imaging    CT CHEST IMPRESSION: 1. No evidence of metastatic disease to  the thorax. 2. Sequela of old granulomatous disease, as above. 3. Aortic  atherosclerosis, in addition to left main and 3 vessel coronary artery disease. 4. Ectasia of the ascending thoracic aorta (4 cm in diameter). Recommend annual imaging followup by CTA or MRA. This recommendation follows 2010 ACCF/AHA/AATS/ACR/ASA/SCA/SCAI/SIR/STS/SVM Guidelines for the Diagnosis and Management of Patients with Thoracic Aortic Disease. Circulation. 2010; 121: H474-Q595. 5. There are calcifications of the aortic valve. Echocardiographic correlation for evaluation of potential valvular dysfunction may be warranted if clinically indicated. 6. Small volume of pneumoperitoneum noted in the visualized upper abdomen, presumably related to recent gastrojejunostomy performed on 11/12/2017. However, clinical correlation is recommended to exclude any signs are symptoms concerning for perforation.      11/20/2017 Procedure    PAC placement      12/18/2017 -  Chemotherapy    FOLFOX every 2 weeks for 6 cycles starting 12/191/8        History of present illness:    Lee Pratt was seen in the hospital due to symptomatic anemia and abdominal pain. He reports that he initially had symptoms of decreased appetite, weight loss of 10-20 lbs, and intermittent abdominal pain. Wife reports the patient having emesis. He reports that his energy levels in the past couple of months has declined. Wife reports that the patient has had generalized weakness. He notes that for 2 months he has had these symptoms and that prior to that his energy level was good and he used to walk everyday. He denies doing anything else besides walking prior to his recent diagnosis. Wife reports that the patient has a hx of HTN and only takes ASA. He denies MI or any other medical issues. 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 He has two children who live in [place]. Wife states that they moved to [place] due to the cold weather and they do not know any family members locally. Wife reports that  they go to church, but they do not drive.   He had a CT AP completed on 11/09/2017 with results of: IMPRESSION: 1. Circumferential wall thickening involving the gastric antrum with marked distension of the stomach suggestive of an element of gastric outlet obstruction. Further evaluation with endoscopy is recommended. 2. Short-segment occlusion involving the left lower lobe lateral basal subsegmental bronchus, nonspecific though could be indicative of aspiration in the setting of suspected gastric outlet obstruction. Clinical correlation is advised. 3. Small amount of fluid seen with the pelvic cul-de-sac, presumably reactive. 4. Mild ectasia of the abdominal aorta measuring 2.7 cm in diameter. Recommend follow-up aortic ultrasound in 5 years. This recommendation follows ACR consensus guidelines: White Paper of the ACR Incidental Findings Committee II on Vascular Findings. J Am Coll Radiol 2013; 63:875-643. 5. Aortic Atherosclerosis (ICD10-I70.0). Suspected hemodynamically significant stenoses involving the bilateral external iliac arteries and the right common femoral artery. Correlation for lower extremity PAD symptoms is recommended. Further evaluation with the acquisition of ABIs could be performed as indicated.  He was evaluated in [facility] and admitted for abdominal pain, anemia, and partial gastric outlet obstruction on 11/09/2017. He also had weight loss and dark colored stool during his ED visit on 11/09/2017 prior to being admitted to the hospital. He had an EGD completed on 11/10/2017 which was performed by Dr. Zenovia Jarred with pathology results showing: adenocarcinoma of gastric mass. Subsequently, he had a diagnostic larproscopic with biopsy, gastrojejunostomy, insertion of G and J tube performed by Dr. [name] on 32/95/1884  with pathology results of peritoneum, biopsy with metastatic adenocarcinoma. During his admission, he has received IV feraheme twice on 11/12/2017 and 11/15/2017 due to  symptomatic anemia.     CURRENT THERAPY: first line FOLFOX every 2 weeks started 12/18/17     INTERVAL HISTORY:  Mr. Holleman returns is here for a follow up and cycle 2 chemo. He presents to the infusion room today accompanied by his wife.  He notes he tolerated first treatment well. He notes he does not eat much. He eats liquid diet mostly. He would like to return to his normal diet. He does feeding for about 10 hours. He loss 2 pounds since last visit. He denies nausea. He notes last night he did vomit one time. He denies abdominal pain.  He notes his son Cristie Hem will be in town the rest of the week.    REVIEW OF SYSTEMS:   Constitutional: Denies fevers, chills (+) loss 2 pounds.  (+) low food intake   Ears, nose, mouth, throat, and face: Denies mucositis or sore throat (+) hard of hearing  Respiratory: Denies dyspnea or wheezes Cardiovascular: Denies palpitation, chest discomfort or lower extremity swelling Gastrointestinal:  Denies emesis, diarrhea, dysphagia, bloating, early satiety, heartburn, abdominal pain, or blood in stool  (+) RLQ PEG and J tube  Skin: Denies abnormal skin rashes Lymphatics: Denies new lymphadenopathy or easy bruising Neurological:Denies numbness, tingling or new weaknesses Behavioral/Psych: Mood is stable, no new changes  All other systems were reviewed with the patient and are negative.  MEDICAL HISTORY:  Past Medical History:  Diagnosis Date  . Hyperglycemia 03/16/2013  . Hypertension   . Stomach cancer [facility])     SURGICAL HISTORY: Past Surgical History:  Procedure Laterality Date  . ESOPHAGOGASTRODUODENOSCOPY N/A 11/10/2017   Procedure: ESOPHAGOGASTRODUODENOSCOPY (EGD);  Surgeon: Jerene Bears, MD;  Location: [facility];  Service: Gastroenterology;  Laterality: N/A;  . GASTROJEJUNOSTOMY N/A 11/12/2017   Procedure: Diagnostic Laparoscopy with biopsy, gastojejunostomy, insertion of G-tube and J-tube;  Surgeon: [name], MD;  Location: [facility];   Service: General;  Laterality: N/A;  . NO PAST SURGERIES  12/21/2011  . PORTACATH PLACEMENT Left 11/20/2017   Procedure: INSERTION PORT-A-CATH;  Surgeon: Ralene Ok, MD;  Location: [facility];  Service: General;  Laterality: Left;    I have reviewed the social history and family history with the patient and they are unchanged from previous note.  ALLERGIES:  has No Known Allergies.  MEDICATIONS:  Current Outpatient Medications  Medication Sig Dispense Refill  . aspirin EC 81 MG tablet Take 81 mg by mouth daily.    Marland Kitchen CARTIA XT 120 MG 24 hr capsule Take 120 mg daily as needed by mouth (Systolic BP > 161/09).     . docusate sodium (COLACE) 100 MG capsule Take 1 capsule (100 mg total) by mouth 2 (two) times daily as needed for mild constipation. 30 capsule 1  . lidocaine-prilocaine (EMLA) cream Apply to affected area once 30 g 3  . ondansetron (ZOFRAN ODT) 8 MG disintegrating tablet Take 1 tablet (8 mg total) by mouth 2 (two) times daily as needed for nausea or vomiting. 20 tablet 1  . ondansetron (ZOFRAN) 8 MG tablet Take 1 tablet (8 mg total) by mouth 2 (two) times daily as needed for refractory nausea / vomiting. Start on day 3 after chemotherapy. 30 tablet 1  . prochlorperazine (COMPAZINE) 10 MG tablet Take 1 tablet (10 mg total) by mouth every 6 (six) hours as needed (Nausea or vomiting). (Patient not  taking: Reported on 12/18/2017) 30 tablet 1   No current facility-administered medications for this visit.     PHYSICAL EXAMINATION: ECOG PERFORMANCE STATUS: 2 - Symptomatic, <50% confined to bed Blood pressure 129/75, pulse 89, respiratory rate 16, temperature 36.6, pulse ox 100% on room air, weight 123 pounds GENERAL:alert, no distress and comfortable SKIN: skin color, texture are normal, no rashes or significant lesions (+) dry mucous membranes  EYES: normal, Conjunctiva are pink and non-injected, sclera clear OROPHARYNX:no exudate, no erythema and lips, buccal mucosa, and tongue  normal  NECK: supple, thyroid normal size, non-tender, without nodularity LYMPH:  no palpable cervical, supra/infraclavicular, axillary, or inguinal lymphadenopathy  LUNGS: clear to auscultation bilaterally with normal breathing effort HEART: regular rate & rhythm and no murmurs and no lower extremity edema ABDOMEN:abdomen soft, non-tender and normal bowel sounds (+) midline abdominal incision healed well, no erythema or discharge (+) RLQ PEG and J tube, no drainage or erythema Musculoskeletal:no cyanosis of digits and no clubbing  NEURO: alert & oriented x 3 with fluent speech, no focal motor/sensory deficits PAC without erythema   LABORATORY DATA:  I have reviewed the data as listed CBC Latest Ref Rng & Units 01/01/2018 12/18/2017 11/20/2017  WBC 4.0 - 10.3 10e3/uL 6.2 8.1 7.8  Hemoglobin 13.0 - 17.1 g/dL 11.1(L) 11.1(L) 10.6(L)  Hematocrit 38.4 - 49.9 % 34.3(L) 34.1(L) 33.1(L)  Platelets 140 - 400 10e3/uL 330 281 400     CMP Latest Ref Rng & Units 01/01/2018 12/18/2017 11/20/2017  Glucose 70 - 140 mg/dl 98 103 112(H)  BUN 7.0 - 26.0 mg/dL 27.7(H) 21.2 41(H)  Creatinine 0.7 - 1.3 mg/dL 0.8 0.8 0.88  Sodium 136 - 145 mEq/L 139 135(L) 140  Potassium 3.5 - 5.1 mEq/L 4.2 4.2 3.9  Chloride 101 - 111 mmol/L - - 106  CO2 22 - 29 mEq/L '27 27 26  '$ Calcium 8.4 - 10.4 mg/dL 8.7 8.7 8.9  Total Protein 6.4 - 8.3 g/dL 7.1 6.7 -  Total Bilirubin 0.20 - 1.20 mg/dL 0.22 0.37 -  Alkaline Phos 40 - 150 U/L 99 80 -  AST 5 - 34 U/L 35(H) 35(H) -  ALT 0 - 55 U/L 42 33 -   11/12/17 Diagnosis Peritoneum, biopsy - METASTATIC ADENOCARCINOMA  11/10/17 Diagnosis Stomach, biopsy, gastric mass - ADENOCARCINOMA - SEE COMMENT HER2 - NEGATIVE   RADIOGRAPHIC STUDIES: I have personally reviewed the radiological images as listed and agreed with the findings in the report. No results found.   11/19/17 CT CHEST IMPRESSION: 1. No evidence of metastatic disease to the thorax. 2. Sequela of old granulomatous  disease, as above. 3. Aortic atherosclerosis, in addition to left main and 3 vessel coronary artery disease. 4. Ectasia of the ascending thoracic aorta (4 cm in diameter). Recommend annual imaging followup by CTA or MRA. This recommendation follows 2010 ACCF/AHA/AATS/ACR/ASA/SCA/SCAI/SIR/STS/SVM Guidelines for the Diagnosis and Management of Patients with Thoracic Aortic Disease. Circulation. 2010; 121: B510-C585. 5. There are calcifications of the aortic valve. Echocardiographic correlation for evaluation of potential valvular dysfunction may be warranted if clinically indicated. 6. Small volume of pneumoperitoneum noted in the visualized upper abdomen, presumably related to recent gastrojejunostomy performed on 11/12/2017. However, clinical correlation is recommended to exclude any signs are symptoms concerning for perforation.  Aortic Atherosclerosis (ICD10-I70.0).  11/09/17 CT CAP IMPRESSION: 1. Circumferential wall thickening involving the gastric antrum with marked distension of the stomach suggestive of an element of gastric outlet obstruction. Further evaluation with endoscopy is recommended. 2. Short-segment occlusion involving the left  lower lobe lateral basal subsegmental bronchus, nonspecific though could be indicative of aspiration in the setting of suspected gastric outlet obstruction. Clinical correlation is advised. 3. Small amount of fluid seen with the pelvic cul-de-sac, presumably reactive. 4. Mild ectasia of the abdominal aorta measuring 2.7 cm in diameter. Recommend follow-up aortic ultrasound in 5 years. This recommendation follows ACR consensus guidelines: White Paper of the ACR Incidental Findings Committee II on Vascular Findings. J Am Coll Radiol 2013; 70:350-093. 5. Aortic Atherosclerosis (ICD10-I70.0). Suspected hemodynamically significant stenoses involving the bilateral external iliac arteries and the right common femoral artery. Correlation for  lower extremity PAD symptoms is recommended. Further evaluation with the acquisition of ABIs could be performed as indicated.   ASSESSMENT & PLAN: 83 y.o. male with PMH of hypertension, but otherwise healthy, a retired Licensed conveyancer, presented with abdominal pain, nausea, anorexia and weight loss.  1.  Gastric adenocarcinoma with peritoneal metastasis, stage IV -We previously reviewed imaging, surgical findings, and pathology results. We reviewed that his cancer is not curable at this stage and the goal of therapy is to prolong his life and improve quality of life. -He received IV Feraheme x2 in the hospital, anemia improved. Will check iron studies every Month. -Patient refuses to use interpreter service, I was not very sure if he completely understood our discussion, given the complexity of his diagnosis and management options. I have spoken with his son Cristie Hem, who is also a physician to help him understand his diagnosis, prognosis and treatment  -I discussed his FO genomic testing results. His PDL-1 results show CPS 0, HER2 Negative and MS-Stable. He is not an immunotherapy candidate at this time, and would not benefit from Herceptin   -He has recovered well since hospital discharge, gained weight back, overall feels well, ready to start chemo. -He started FOLFOX on 12/18/17 and tolerated first dose well overall with dose reduction. He had only one episode of emesis.  -I encouraged him to increase his tube feeding by1-2 hours/day and increase in liquid diet by mouth.  -I will increase his FOLFOX dose with 2nd cycle today.  -Labs adequate to proceed with cycle 2 today  -f/u on 1/16 -plan to repeated staging scan after 4-5 cycles of chemo.    2.  Gastric outlet syndrome from cancer, status post gastro jejunostomy and PEG placement  -We discussed venting PEG tube PRN if he becomes symptomatic with bloating or abdominal discomfort. Placed urgent dietician referral to establish care and  monitor closely.  -For mild nausea and constipation he was prescribed zofran ODT and colace PRN, prescription given to patient on 12/5 -I reviewed how to use zofran if he experiences nausea.    3.  Severe malnutrition, status post J-tube placement for nutrition -He is still severely malnourished. He continues J tube feeding at 50 m//hr for 16 total hours per day, broken up into 8 hour increments; he is to increase rate as tolerated. Home care assists with this.  -He saw Dr. Marcello Moores 12/03/17 who instructed him to advance to liquid diet, we encouraged this. -He has gained some weight back. I encouraged him to continue liquid diet, tube feeding and supplement drink   4.  Severe iron deficient anemia - improved, status post blood transfusion and IV Feraheme X2 -Iron study from 12/18/17 show Hg at 11.1, Ferritin at 394, iron at 28 and Sat at 10%. -We will continue monitor his iron level -Stable hg at 11.1 (01/01/18)   5. Hypertension  -he is on cartia  -We  discussed the potential impact on his blood pressure from chemotherapy, will monitor closely  6. Goal of care discussion  -We again discussed the incurable nature of his/her cancer, and the overall poor prognosis, especially if he/she does not have good response to chemotherapy or progress on chemo -The patient understands the goal of care is palliative. -I recommend DNR/DNI, he will think about it -[place]  and the son ultimately plans to move his parents in with him. In the meantime pt needs assistance  -Social work referral has been sent.    Plan: -Labs reviewed and adequate to increase FOLFOX with cycle 2 today  -lab, flush, f/u and FOLFOX on 1/16   No orders of the defined types were placed in this encounter.  All questions were answered. The patient knows to call the clinic with any problems, questions or concerns. No barriers to learning was detected.  I spent 25 minutes counseling the patient face to face. The  total time spent in the appointment was 30 minutes and more than 50% was on counseling, review of test results, and coordination of care.   [name]  01/01/2018   This document serves as a record of services personally performed by [name], MD. It was created on her behalf by Joslyn Devon, a trained medical scribe. The creation of this record is based on the scribe's personal observations and the provider's statements to them.    I have reviewed the above documentation for accuracy and completeness, and I agree with the above.

## 2017-12-31 DIAGNOSIS — E440 Moderate protein-calorie malnutrition: Secondary | ICD-10-CM | POA: Insufficient documentation

## 2018-01-01 ENCOUNTER — Other Ambulatory Visit (HOSPITAL_BASED_OUTPATIENT_CLINIC_OR_DEPARTMENT_OTHER): Payer: Medicare Other

## 2018-01-01 ENCOUNTER — Ambulatory Visit: Payer: Medicare Other

## 2018-01-01 ENCOUNTER — Ambulatory Visit (HOSPITAL_BASED_OUTPATIENT_CLINIC_OR_DEPARTMENT_OTHER): Payer: Medicare Other | Admitting: Hematology

## 2018-01-01 ENCOUNTER — Ambulatory Visit (HOSPITAL_BASED_OUTPATIENT_CLINIC_OR_DEPARTMENT_OTHER): Payer: Medicare Other

## 2018-01-01 ENCOUNTER — Encounter: Payer: Self-pay | Admitting: Hematology

## 2018-01-01 VITALS — BP 129/75 | HR 89 | Temp 97.9°F | Resp 16 | Wt 123.5 lb

## 2018-01-01 DIAGNOSIS — C786 Secondary malignant neoplasm of retroperitoneum and peritoneum: Secondary | ICD-10-CM | POA: Diagnosis not present

## 2018-01-01 DIAGNOSIS — C163 Malignant neoplasm of pyloric antrum: Secondary | ICD-10-CM

## 2018-01-01 DIAGNOSIS — Z95828 Presence of other vascular implants and grafts: Secondary | ICD-10-CM

## 2018-01-01 DIAGNOSIS — D509 Iron deficiency anemia, unspecified: Secondary | ICD-10-CM

## 2018-01-01 DIAGNOSIS — C169 Malignant neoplasm of stomach, unspecified: Secondary | ICD-10-CM

## 2018-01-01 DIAGNOSIS — Z5111 Encounter for antineoplastic chemotherapy: Secondary | ICD-10-CM

## 2018-01-01 DIAGNOSIS — I10 Essential (primary) hypertension: Secondary | ICD-10-CM

## 2018-01-01 DIAGNOSIS — E440 Moderate protein-calorie malnutrition: Secondary | ICD-10-CM

## 2018-01-01 DIAGNOSIS — Z7189 Other specified counseling: Secondary | ICD-10-CM

## 2018-01-01 LAB — CBC & DIFF AND RETIC
BASO%: 0.5 % (ref 0.0–2.0)
BASOS ABS: 0 10*3/uL (ref 0.0–0.1)
EOS ABS: 0.2 10*3/uL (ref 0.0–0.5)
EOS%: 2.6 % (ref 0.0–7.0)
HEMATOCRIT: 34.3 % — AB (ref 38.4–49.9)
HEMOGLOBIN: 11.1 g/dL — AB (ref 13.0–17.1)
Immature Retic Fract: 8.1 % (ref 3.00–10.60)
LYMPH%: 27.1 % (ref 14.0–49.0)
MCH: 29.6 pg (ref 27.2–33.4)
MCHC: 32.4 g/dL (ref 32.0–36.0)
MCV: 91.5 fL (ref 79.3–98.0)
MONO#: 0.5 10*3/uL (ref 0.1–0.9)
MONO%: 8.4 % (ref 0.0–14.0)
NEUT#: 3.8 10*3/uL (ref 1.5–6.5)
NEUT%: 61.4 % (ref 39.0–75.0)
PLATELETS: 330 10*3/uL (ref 140–400)
RBC: 3.75 10*6/uL — ABNORMAL LOW (ref 4.20–5.82)
RDW: 18.7 % — ABNORMAL HIGH (ref 11.0–14.6)
RETIC CT ABS: 52.5 10*3/uL (ref 34.80–93.90)
Retic %: 1.4 % (ref 0.80–1.80)
WBC: 6.2 10*3/uL (ref 4.0–10.3)
lymph#: 1.7 10*3/uL (ref 0.9–3.3)

## 2018-01-01 LAB — COMPREHENSIVE METABOLIC PANEL
ALT: 42 U/L (ref 0–55)
AST: 35 U/L — AB (ref 5–34)
Albumin: 3.1 g/dL — ABNORMAL LOW (ref 3.5–5.0)
Alkaline Phosphatase: 99 U/L (ref 40–150)
Anion Gap: 8 mEq/L (ref 3–11)
BILIRUBIN TOTAL: 0.22 mg/dL (ref 0.20–1.20)
BUN: 27.7 mg/dL — AB (ref 7.0–26.0)
CHLORIDE: 104 meq/L (ref 98–109)
CO2: 27 meq/L (ref 22–29)
CREATININE: 0.8 mg/dL (ref 0.7–1.3)
Calcium: 8.7 mg/dL (ref 8.4–10.4)
EGFR: >60 mL/min/{1.73_m2} (ref >60)
GLUCOSE: 98 mg/dL (ref 70–140)
Potassium: 4.2 mEq/L (ref 3.5–5.1)
SODIUM: 139 meq/L (ref 136–145)
TOTAL PROTEIN: 7.1 g/dL (ref 6.4–8.3)

## 2018-01-01 MED ORDER — SODIUM CHLORIDE 0.9% FLUSH
10.0000 mL | INTRAVENOUS | Status: DC | PRN
  Administered 2018-01-01: 10 mL via INTRAVENOUS
  Filled 2018-01-01: qty 10

## 2018-01-01 MED ORDER — DEXTROSE 5 % IV SOLN
Freq: Once | INTRAVENOUS | Status: AC
  Administered 2018-01-01: 09:00:00 via INTRAVENOUS

## 2018-01-01 MED ORDER — LEUCOVORIN CALCIUM INJECTION 350 MG
400.0000 mg/m2 | Freq: Once | INTRAVENOUS | Status: AC
  Administered 2018-01-01: 648 mg via INTRAVENOUS
  Filled 2018-01-01: qty 32.4

## 2018-01-01 MED ORDER — OXALIPLATIN CHEMO INJECTION 100 MG/20ML
70.0000 mg/m2 | Freq: Once | INTRAVENOUS | Status: AC
  Administered 2018-01-01: 115 mg via INTRAVENOUS
  Filled 2018-01-01: qty 20

## 2018-01-01 MED ORDER — PALONOSETRON HCL INJECTION 0.25 MG/5ML
INTRAVENOUS | Status: AC
  Filled 2018-01-01: qty 5

## 2018-01-01 MED ORDER — DEXAMETHASONE SODIUM PHOSPHATE 10 MG/ML IJ SOLN
10.0000 mg | Freq: Once | INTRAMUSCULAR | Status: AC
  Administered 2018-01-01: 10 mg via INTRAVENOUS

## 2018-01-01 MED ORDER — PALONOSETRON HCL INJECTION 0.25 MG/5ML
0.2500 mg | Freq: Once | INTRAVENOUS | Status: AC
  Administered 2018-01-01: 0.25 mg via INTRAVENOUS

## 2018-01-01 MED ORDER — SODIUM CHLORIDE 0.9 % IV SOLN
2400.0000 mg/m2 | INTRAVENOUS | Status: DC
  Administered 2018-01-01: 3900 mg via INTRAVENOUS
  Filled 2018-01-01: qty 78

## 2018-01-01 MED ORDER — DEXAMETHASONE SODIUM PHOSPHATE 10 MG/ML IJ SOLN
INTRAMUSCULAR | Status: AC
  Filled 2018-01-01: qty 1

## 2018-01-01 MED ORDER — SODIUM CHLORIDE 0.9% FLUSH
10.0000 mL | INTRAVENOUS | Status: DC | PRN
  Filled 2018-01-01: qty 10

## 2018-01-01 MED ORDER — HEPARIN SOD (PORK) LOCK FLUSH 100 UNIT/ML IV SOLN
500.0000 [IU] | Freq: Once | INTRAVENOUS | Status: DC | PRN
  Filled 2018-01-01: qty 5

## 2018-01-01 NOTE — Patient Instructions (Signed)
[facility] Discharge Instructions for Patients Receiving Chemotherapy  Today you received the following chemotherapy agents: Oxaliplatin, Leucovorin, and 5FU.  To help prevent nausea and vomiting after your treatment, we encourage you to take your nausea medication as directed.   If you develop nausea and vomiting that is not controlled by your nausea medication, call the clinic.   BELOW ARE SYMPTOMS THAT SHOULD BE REPORTED IMMEDIATELY:  *FEVER GREATER THAN 100.5 F  *CHILLS WITH OR WITHOUT FEVER  NAUSEA AND VOMITING THAT IS NOT CONTROLLED WITH YOUR NAUSEA MEDICATION  *UNUSUAL SHORTNESS OF BREATH  *UNUSUAL BRUISING OR BLEEDING  TENDERNESS IN MOUTH AND THROAT WITH OR WITHOUT PRESENCE OF ULCERS  *URINARY PROBLEMS  *BOWEL PROBLEMS  UNUSUAL RASH Items with * indicate a potential emergency and should be followed up as soon as possible.  Feel free to call the clinic should you have any questions or concerns. The clinic phone number is [phone].  Please show the CHEMO ALERT CARD at check-in to the Emergency Department and triage nurse.    

## 2018-01-02 DIAGNOSIS — Z431 Encounter for attention to gastrostomy: Secondary | ICD-10-CM | POA: Diagnosis not present

## 2018-01-02 DIAGNOSIS — E43 Unspecified severe protein-calorie malnutrition: Secondary | ICD-10-CM | POA: Diagnosis not present

## 2018-01-02 DIAGNOSIS — C168 Malignant neoplasm of overlapping sites of stomach: Secondary | ICD-10-CM | POA: Diagnosis not present

## 2018-01-02 DIAGNOSIS — K2970 Gastritis, unspecified, without bleeding: Secondary | ICD-10-CM | POA: Diagnosis not present

## 2018-01-02 DIAGNOSIS — D62 Acute posthemorrhagic anemia: Secondary | ICD-10-CM | POA: Diagnosis not present

## 2018-01-02 DIAGNOSIS — K210 Gastro-esophageal reflux disease with esophagitis: Secondary | ICD-10-CM | POA: Diagnosis not present

## 2018-01-03 ENCOUNTER — Ambulatory Visit (HOSPITAL_BASED_OUTPATIENT_CLINIC_OR_DEPARTMENT_OTHER): Payer: Medicare Other

## 2018-01-03 ENCOUNTER — Telehealth: Payer: Self-pay | Admitting: Hematology

## 2018-01-03 VITALS — BP 112/62 | HR 82 | Temp 98.2°F | Resp 16

## 2018-01-03 DIAGNOSIS — C163 Malignant neoplasm of pyloric antrum: Secondary | ICD-10-CM | POA: Diagnosis not present

## 2018-01-03 DIAGNOSIS — Z452 Encounter for adjustment and management of vascular access device: Secondary | ICD-10-CM

## 2018-01-03 DIAGNOSIS — Z95828 Presence of other vascular implants and grafts: Secondary | ICD-10-CM

## 2018-01-03 MED ORDER — HEPARIN SOD (PORK) LOCK FLUSH 100 UNIT/ML IV SOLN
500.0000 [IU] | Freq: Once | INTRAVENOUS | Status: AC | PRN
  Administered 2018-01-03: 500 [IU] via INTRAVENOUS
  Filled 2018-01-03: qty 5

## 2018-01-03 MED ORDER — SODIUM CHLORIDE 0.9% FLUSH
10.0000 mL | INTRAVENOUS | Status: DC | PRN
  Administered 2018-01-03: 10 mL via INTRAVENOUS
  Filled 2018-01-03: qty 10

## 2018-01-03 NOTE — Telephone Encounter (Signed)
No 1/2 los.

## 2018-01-10 ENCOUNTER — Encounter (HOSPITAL_COMMUNITY): Payer: Self-pay

## 2018-01-14 NOTE — Progress Notes (Signed)
[facility]  [phone] [phone]  Clinic Follow up Note   Patient Care Team: [name], MD as PCP - General (Internal Medicine) [name], MD as Consulting Physician (General Surgery) [name], MD as Consulting Physician (Hematology) [name], NP as Nurse Practitioner (Nurse Practitioner)   Date of Service:  01/15/2018  CHIEF COMPLAINT: F/u metastatic gastric cancer   SUMMARY OF ONCOLOGIC HISTORY:   Gastric cancer ([place])   11/09/2017 Imaging    CT A/P IMPRESSION: 1. Circumferential wall thickening involving the gastric antrum with marked distension of the stomach suggestive of an element of gastric outlet obstruction. Further evaluation with endoscopy is recommended. 2. Short-segment occlusion involving the left lower lobe lateral basal subsegmental bronchus, nonspecific though could be indicative of aspiration in the setting of suspected gastric outlet obstruction. Clinical correlation is advised. 3. Small amount of fluid seen with the pelvic cul-de-sac, presumably reactive. 4. Mild ectasia of the abdominal aorta measuring 2.7 cm in diameter. Recommend follow-up aortic ultrasound in 5 years. This recommendation follows ACR consensus guidelines: White Paper of the ACR Incidental Findings Committee II on Vascular Findings. J Am Coll Radiol 2013; 26:948-546. 5. Aortic Atherosclerosis (ICD10-I70.0). Suspected hemodynamically significant stenoses involving the bilateral external iliac arteries and the right common femoral artery. Correlation for lower extremity PAD symptoms is recommended. Further evaluation with the acquisition of ABIs could be performed as indicated.      11/10/2017 Procedure    Upper endoscopy Findings: per Dr. Hilarie Fredrickson Mild esophagitis with no bleeding was found in the lower third of the esophagus. This is likely from reflux in the setting of partial gastric outlet obstruction. Retained fluid and food  debris was found in the gastric body. This was aspirated via the suction channel of the endoscope but was unable to be fully cleared due to semi-solid material. 1800 mL was removed. Diffuse moderate inflammation characterized by erythema and granularity was found in the cardia, in the gastric fundus and in the gastric body. A large, fungating and friable mass with no active bleeding was found at the incisura and in the gastric antrum. The mass was covered with mucus. This was biopsied extensively with a cold forceps for histology. Attempt made to maneuver around this mass and find the pylorus/duodenum, however given retained food/food in the stomach and lack of airway protection, this was not possible. An examination of the duodenum was not performed.      11/10/2017 Pathology Results    Diagnosis Stomach, biopsy, gastric mass - ADENOCARCINOMA - SEE COMMENT  Results: HER2 - NEGATIVE RATIO OF HER2/CEP17 SIGNALS 1.92 AVERAGE HER2 COPY NUMBER PER CELL 2.45  Microscopic Comment Based on the biopsy the carcinoma is well-differentiated. Dr. Gari Crown reviewed the case and agrees with the above diagnosis. Dr. Hilarie Fredrickson was notified of these results on November 12, 2017. Her-2 is pending and will be reported in an addendum. Warthin-Starry is negative for H. pylori. 1 of      11/12/2017 Pathology Results    Diagnosis Peritoneum, biopsy - METASTATIC ADENOCARCINOMA. Microscopic Comment The morphologic appearance is similar to previously diagnosed gastric adenocarcinoma ([id number]). (BNS:ah/gt, 11/13/17)      11/12/2017 Surgery    PROCEDURE:   Diagnostic Laparoscopy with biopsy, gastojejunostomy, insertion of G-tube and J-tube   Surgeon(s): [name], MD        81/82/9937 Initial Diagnosis    Gastric cancer ([place])      11/19/2017 Imaging    CT CHEST IMPRESSION: 1. No evidence of metastatic disease to  the thorax. 2. Sequela of old granulomatous disease, as above. 3. Aortic  atherosclerosis, in addition to left main and 3 vessel coronary artery disease. 4. Ectasia of the ascending thoracic aorta (4 cm in diameter). Recommend annual imaging followup by CTA or MRA. This recommendation follows 2010 ACCF/AHA/AATS/ACR/ASA/SCA/SCAI/SIR/STS/SVM Guidelines for the Diagnosis and Management of Patients with Thoracic Aortic Disease. Circulation. 2010; 121: W109-N235. 5. There are calcifications of the aortic valve. Echocardiographic correlation for evaluation of potential valvular dysfunction may be warranted if clinically indicated. 6. Small volume of pneumoperitoneum noted in the visualized upper abdomen, presumably related to recent gastrojejunostomy performed on 11/12/2017. However, clinical correlation is recommended to exclude any signs are symptoms concerning for perforation.      11/20/2017 Procedure    PAC placement      12/18/2017 -  Chemotherapy    FOLFOX every 2 weeks for 6 cycles starting 12/191/8        History of present illness:    Lee Pratt was seen in the hospital due to symptomatic anemia and abdominal pain. He reports that he initially had symptoms of decreased appetite, weight loss of 10-20 lbs, and intermittent abdominal pain. Wife reports the patient having emesis. He reports that his energy levels in the past couple of months has declined. Wife reports that the patient has had generalized weakness. He notes that for 2 months he has had these symptoms and that prior to that his energy level was good and he used to walk everyday. He denies doing anything else besides walking prior to his recent diagnosis. Wife reports that the patient has a hx of HTN and only takes ASA. He denies MI or any other medical issues. 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 He has two children who live in [place]. Wife states that they moved to [place] due to the cold weather and they do not know any family members locally. Wife reports that  they go to church, but they do not drive.   He had a CT AP completed on 11/09/2017 with results of: IMPRESSION: 1. Circumferential wall thickening involving the gastric antrum with marked distension of the stomach suggestive of an element of gastric outlet obstruction. Further evaluation with endoscopy is recommended. 2. Short-segment occlusion involving the left lower lobe lateral basal subsegmental bronchus, nonspecific though could be indicative of aspiration in the setting of suspected gastric outlet obstruction. Clinical correlation is advised. 3. Small amount of fluid seen with the pelvic cul-de-sac, presumably reactive. 4. Mild ectasia of the abdominal aorta measuring 2.7 cm in diameter. Recommend follow-up aortic ultrasound in 5 years. This recommendation follows ACR consensus guidelines: White Paper of the ACR Incidental Findings Committee II on Vascular Findings. J Am Coll Radiol 2013; 57:322-025. 5. Aortic Atherosclerosis (ICD10-I70.0). Suspected hemodynamically significant stenoses involving the bilateral external iliac arteries and the right common femoral artery. Correlation for lower extremity PAD symptoms is recommended. Further evaluation with the acquisition of ABIs could be performed as indicated.  He was evaluated in [facility] and admitted for abdominal pain, anemia, and partial gastric outlet obstruction on 11/09/2017. He also had weight loss and dark colored stool during his ED visit on 11/09/2017 prior to being admitted to the hospital. He had an EGD completed on 11/10/2017 which was performed by Dr. Zenovia Jarred with pathology results showing: adenocarcinoma of gastric mass. Subsequently, he had a diagnostic larproscopic with biopsy, gastrojejunostomy, insertion of G and J tube performed by Dr. [name] on 42/70/6237  with pathology results of peritoneum, biopsy with metastatic adenocarcinoma. During his admission, he has received IV feraheme twice on 11/12/2017 and 11/15/2017 due to  symptomatic anemia.     CURRENT THERAPY: first line FOLFOX every 2 weeks started 12/18/17   INTERVAL HISTORY:  Lee Pratt returns is here for a follow up and cycle 3 of FOLFOX. He presents to the clinic room today accompanied by his wife. Of note since last visit, he has lost 7 lbs. He states that his appetite is not good because he feels nauseous every time he tries to eat. He is also sleeping more than usual. He has occasional abdominal pain that is manageable. He reports that his J tube has been leaking for the past 3 weeks. He changes the dressing every day.   On review of systems, pt denies fatigue, fever, vomiting, or any other complaints at this time. Pertinent positives are listed and detailed within the above HPI.    REVIEW OF SYSTEMS:  Constitutional: Denies fevers, chills (+) loss 7 pounds.  (+) low food intake   Ears, nose, mouth, throat, and face: Denies mucositis or sore throat (+) hard of hearing  Respiratory: Denies dyspnea or wheezes Cardiovascular: Denies palpitation, chest discomfort or lower extremity swelling Gastrointestinal:  Denies emesis, diarrhea, dysphagia, bloating, early satiety, heartburn, abdominal pain, or blood in stool  (+) RLQ PEG and J tube  Skin: Denies abnormal skin rashes Lymphatics: Denies new lymphadenopathy or easy bruising Neurological:Denies numbness, tingling or new weaknesses Behavioral/Psych: Mood is stable, no new changes  All other systems were reviewed with the patient and are negative.  MEDICAL HISTORY:  Past Medical History:  Diagnosis Date  . Hyperglycemia 03/16/2013  . Hypertension   . Stomach cancer [facility])     SURGICAL HISTORY: Past Surgical History:  Procedure Laterality Date  . ESOPHAGOGASTRODUODENOSCOPY N/A 11/10/2017   Procedure: ESOPHAGOGASTRODUODENOSCOPY (EGD);  Surgeon: Jerene Bears, MD;  Location: [facility];  Service: Gastroenterology;  Laterality: N/A;  . GASTROJEJUNOSTOMY N/A 11/12/2017   Procedure: Diagnostic  Laparoscopy with biopsy, gastojejunostomy, insertion of G-tube and J-tube;  Surgeon: [name], MD;  Location: [facility];  Service: General;  Laterality: N/A;  . NO PAST SURGERIES  12/21/2011  . PORTACATH PLACEMENT Left 11/20/2017   Procedure: INSERTION PORT-A-CATH;  Surgeon: Ralene Ok, MD;  Location: [facility];  Service: General;  Laterality: Left;    I have reviewed the social history and family history with the patient and they are unchanged from previous note.  ALLERGIES:  has No Known Allergies.  MEDICATIONS:  Current Outpatient Medications  Medication Sig Dispense Refill  . aspirin EC 81 MG tablet Take 81 mg by mouth daily.    Marland Kitchen CARTIA XT 120 MG 24 hr capsule Take 120 mg daily as needed by mouth (Systolic BP > 106/26).     . docusate sodium (COLACE) 100 MG capsule Take 1 capsule (100 mg total) by mouth 2 (two) times daily as needed for mild constipation. 30 capsule 1  . lidocaine-prilocaine (EMLA) cream Apply to affected area once 30 g 3  . ondansetron (ZOFRAN ODT) 8 MG disintegrating tablet Take 1 tablet (8 mg total) by mouth 2 (two) times daily as needed for nausea or vomiting. 20 tablet 1  . ondansetron (ZOFRAN) 8 MG tablet Take 1 tablet (8 mg total) by mouth 2 (two) times daily as needed for refractory nausea / vomiting. Start on day 3 after chemotherapy. 30 tablet 1  . prochlorperazine (COMPAZINE) 10 MG tablet Take 1 tablet (10  mg total) by mouth every 6 (six) hours as needed (Nausea or vomiting). 30 tablet 1   No current facility-administered medications for this visit.     PHYSICAL EXAMINATION: ECOG PERFORMANCE STATUS: 2 - Symptomatic, <50% confined to bed Blood pressure 129/75, pulse 89, respiratory rate 16, temperature 36.6, pulse ox 100% on room air, weight 123 pounds GENERAL:alert, no distress and comfortable SKIN: skin color, texture are normal, no rashes or significant lesions (+) dry mucous membranes  EYES: normal, Conjunctiva are pink and non-injected, sclera  clear OROPHARYNX:no exudate, no erythema and lips, buccal mucosa, and tongue normal  NECK: supple, thyroid normal size, non-tender, without nodularity LYMPH:  no palpable cervical, supra/infraclavicular, axillary, or inguinal lymphadenopathy  LUNGS: clear to auscultation bilaterally with normal breathing effort HEART: regular rate & rhythm and no murmurs and no lower extremity edema ABDOMEN:abdomen soft, non-tender and normal bowel sounds (+) midline abdominal incision healed well, no erythema or discharge (+) RLQ PEG and J tube, no drainage or erythema Musculoskeletal:no cyanosis of digits and no clubbing  NEURO: alert & oriented x 3 with fluent speech, no focal motor/sensory deficits PAC without erythema       LABORATORY DATA:  I have reviewed the data as listed CBC Latest Ref Rng & Units 01/15/2018 01/01/2018 12/18/2017  WBC 4.0 - 10.3 K/uL 7.6 6.2 8.1  Hemoglobin 13.0 - 17.1 g/dL - 11.1(L) 11.1(L)  Hematocrit 38.4 - 49.9 % 39.0 34.3(L) 34.1(L)  Platelets 140 - 400 K/uL 203 330 281     CMP Latest Ref Rng & Units 01/15/2018 01/01/2018 12/18/2017  Glucose 70 - 140 mg/dL 104 98 103  BUN 7 - 26 mg/dL 53(H) 27.7(H) 21.2  Creatinine 0.70 - 1.30 mg/dL 0.82 0.8 0.8  Sodium 136 - 145 mmol/L 140 139 135(L)  Potassium 3.5 - 5.1 mmol/L 5.0 4.2 4.2  Chloride 98 - 109 mmol/L 107 - -  CO2 22 - 29 mmol/L '23 27 27  '$ Calcium 8.4 - 10.4 mg/dL 9.0 8.7 8.7  Total Protein 6.4 - 8.3 g/dL 7.4 7.1 6.7  Total Bilirubin 0.2 - 1.2 mg/dL 0.3 0.22 0.37  Alkaline Phos 40 - 150 U/L 128 99 80  AST 5 - 34 U/L 35(H) 35(H) 35(H)  ALT 0 - 55 U/L 50 42 33   11/12/17 Diagnosis Peritoneum, biopsy - METASTATIC ADENOCARCINOMA  11/10/17 Diagnosis Stomach, biopsy, gastric mass - ADENOCARCINOMA - SEE COMMENT HER2 - NEGATIVE      RADIOGRAPHIC STUDIES: I have personally reviewed the radiological images as listed and agreed with the findings in the report. No results found.   11/19/17 CT CHEST IMPRESSION: 1.  No evidence of metastatic disease to the thorax. 2. Sequela of old granulomatous disease, as above. 3. Aortic atherosclerosis, in addition to left main and 3 vessel coronary artery disease. 4. Ectasia of the ascending thoracic aorta (4 cm in diameter). Recommend annual imaging followup by CTA or MRA. This recommendation follows 2010 ACCF/AHA/AATS/ACR/ASA/SCA/SCAI/SIR/STS/SVM Guidelines for the Diagnosis and Management of Patients with Thoracic Aortic Disease. Circulation. 2010; 121: Z610-R604. 5. There are calcifications of the aortic valve. Echocardiographic correlation for evaluation of potential valvular dysfunction may be warranted if clinically indicated. 6. Small volume of pneumoperitoneum noted in the visualized upper abdomen, presumably related to recent gastrojejunostomy performed on 11/12/2017. However, clinical correlation is recommended to exclude any signs are symptoms concerning for perforation.  Aortic Atherosclerosis (ICD10-I70.0).  11/09/17 CT CAP IMPRESSION: 1. Circumferential wall thickening involving the gastric antrum with marked distension of the stomach suggestive of an element  of gastric outlet obstruction. Further evaluation with endoscopy is recommended. 2. Short-segment occlusion involving the left lower lobe lateral basal subsegmental bronchus, nonspecific though could be indicative of aspiration in the setting of suspected gastric outlet obstruction. Clinical correlation is advised. 3. Small amount of fluid seen with the pelvic cul-de-sac, presumably reactive. 4. Mild ectasia of the abdominal aorta measuring 2.7 cm in diameter. Recommend follow-up aortic ultrasound in 5 years. This recommendation follows ACR consensus guidelines: White Paper of the ACR Incidental Findings Committee II on Vascular Findings. J Am Coll Radiol 2013; 26:948-546. 5. Aortic Atherosclerosis (ICD10-I70.0). Suspected hemodynamically significant stenoses involving the bilateral  external iliac arteries and the right common femoral artery. Correlation for lower extremity PAD symptoms is recommended. Further evaluation with the acquisition of ABIs could be performed as indicated.   ASSESSMENT & PLAN: 84 y.o. male with PMH of hypertension, but otherwise healthy, a retired Licensed conveyancer, presented with abdominal pain, nausea, anorexia and weight loss.  1.  Gastric adenocarcinoma with peritoneal metastasis, stage IV, HER2(-), PD-L1 (-), MSI-stable  -We previously reviewed imaging, surgical findings, and pathology results. We reviewed that his cancer is not curable at this stage and the goal of therapy is to prolong his life and improve quality of life. -He received IV Feraheme x2 in the hospital, anemia improved. Will check iron studies every Month. -Patient refuses to use interpreter service, I was not very sure if he completely understood our discussion, given the complexity of his diagnosis and management options. I have spoken with his son Cristie Hem, who is also a physician to help him understand his diagnosis, prognosis and treatment  -I discussed his FO genomic testing results. His PDL-1 results show CPS 0, HER2 Negative and MS-Stable. He is not an immunotherapy candidate at this time, and would not benefit from Herceptin   -He has recovered well since hospital discharge, gained weight back, overall feels well, ready to start chemo. -the goal of therapy is palliative to prolong his life, and reserve his QOL -He started FOLFOX on 12/18/17 and tolerated first dose well overall with dose reduction. He had only one episode of emesis.  -I encouraged him to increase his tube feeding by1-2 hours/day and increase in liquid diet by mouth.  -I slightly increased his FOLFOX dose with 2nd cycle and he had bit more side effects, overall manageable  -Labs adequate to proceed with cycle 3 today 01/15/18 at same dose  -scan after next cycle  -I advised him to exercise more often and go  out to do things.   2.  Gastric outlet syndrome from cancer, status post gastro jejunostomy and PEG placement  -We discussed venting PEG tube PRN if he becomes symptomatic with bloating or abdominal discomfort. Placed urgent dietician referral to establish care and monitor closely.  -For mild nausea and constipation he was prescribed zofran ODT and colace PRN, prescription given to patient on 12/5 -I reviewed how to use zofran if he experiences nausea.   3.  Severe malnutrition, status post J-tube placement for nutrition -He is still severely malnourished. He continues J tube feeding at 50 m//hr for 16 total hours per day, broken up into 8 hour increments; he is to increase rate as tolerated. Home care assists with this.  -He saw Dr. Marcello Moores 12/03/17 who instructed him to advance to liquid diet, we encouraged this. -He has gained some weight back. I encouraged him to continue liquid diet, tube feeding and supplement drink -He has lost 7 lbs since last visit. I  advised him to take his nausea medication so he can eat more.  -J tube is currently leaking and loose. My PA Lucianne Lei has slightly advanced it, it's less loose now, will monitoring -If he still has J-tube leaking issue, I suggest him to try using G-tube   4.  Severe iron deficient anemia - improved, status post blood transfusion and IV Feraheme X2 -Iron study from 12/18/17 show Hg at 11.1, Ferritin at 394, iron at 28 and Sat at 10%. -We will continue monitor his iron level -Stable hg at 11.1 (01/01/18) -Hgb is 12.9 today (01/15/18)   5. Hypertension  -he is on cartia  -We discussed the potential impact on his blood pressure from chemotherapy, will monitor closely  6. Goal of care discussion  -We again discussed the incurable nature of his/her cancer, and the overall poor prognosis, especially if he/she does not have good response to chemotherapy or progress on chemo -The patient understands the goal of care is palliative. -I recommend  DNR/DNI, he will think about it -Son lives in [place] and the son ultimately plans to move his parents in with him. In the meantime pt needs assistance  -Social work has seen him    Plan: Lab reviewed, will proceed cycle 3 FOLFOX Slightly advanced his loose J-tube, monitor the leaking issue, OK to use G-tube for feeding  He saw dietician Barb today  CT CAP w contrast in 3-4 weeks  Lab, flush, F/u and chemo in 2 weeks    Orders Placed This Encounter  Procedures  . CT Abdomen Pelvis W Contrast    Standing Status:   Future    Standing Expiration Date:   01/15/2019    Order Specific Question:   If indicated for the ordered procedure, I authorize the administration of contrast media per Radiology protocol    Answer:   Yes    Order Specific Question:   Preferred imaging location?    Answer:   [facility]    Order Specific Question:   Radiology Contrast Protocol - do NOT remove file path    Answer:   [email or web address]  . CT Chest W Contrast    Standing Status:   Future    Standing Expiration Date:   01/15/2019    Order Specific Question:   If indicated for the ordered procedure, I authorize the administration of contrast media per Radiology protocol    Answer:   Yes    Order Specific Question:   Preferred imaging location?    Answer:   [facility]    Order Specific Question:   Call Results- Best Contact Number?    Answer:   [id number]    Order Specific Question:   Radiology Contrast Protocol - do NOT remove file path    Answer:   \\[email or web address]   All questions were answered. The patient knows to call the clinic with any problems, questions or concerns. No barriers to learning was detected.  I spent 25 minutes counseling the patient face to face. The total time spent in the appointment was 30 minutes and more than 50% was on counseling, review of test results, and  coordination of care.   [name]  01/15/2018   This document serves as a record of services personally performed by [name], MD. It was created on her behalf by Theresia Bough, a trained medical scribe. The creation of this record is based on the scribe's personal observations and the provider's statements to them.   I have reviewed  the above documentation for accuracy and completeness, and I agree with the above.

## 2018-01-15 ENCOUNTER — Inpatient Hospital Stay: Payer: Medicare Other

## 2018-01-15 ENCOUNTER — Inpatient Hospital Stay: Payer: Medicare Other | Admitting: Nutrition

## 2018-01-15 ENCOUNTER — Inpatient Hospital Stay: Payer: Medicare Other | Attending: Hematology

## 2018-01-15 ENCOUNTER — Inpatient Hospital Stay (HOSPITAL_BASED_OUTPATIENT_CLINIC_OR_DEPARTMENT_OTHER): Payer: Medicare Other | Admitting: Hematology

## 2018-01-15 VITALS — BP 121/65 | HR 92 | Temp 97.6°F | Resp 18 | Ht 69.0 in | Wt 118.5 lb

## 2018-01-15 DIAGNOSIS — I77811 Abdominal aortic ectasia: Secondary | ICD-10-CM | POA: Insufficient documentation

## 2018-01-15 DIAGNOSIS — Z931 Gastrostomy status: Secondary | ICD-10-CM

## 2018-01-15 DIAGNOSIS — K9423 Gastrostomy malfunction: Secondary | ICD-10-CM

## 2018-01-15 DIAGNOSIS — Z5111 Encounter for antineoplastic chemotherapy: Secondary | ICD-10-CM | POA: Diagnosis not present

## 2018-01-15 DIAGNOSIS — C786 Secondary malignant neoplasm of retroperitoneum and peritoneum: Secondary | ICD-10-CM | POA: Diagnosis not present

## 2018-01-15 DIAGNOSIS — E440 Moderate protein-calorie malnutrition: Secondary | ICD-10-CM

## 2018-01-15 DIAGNOSIS — R630 Anorexia: Secondary | ICD-10-CM

## 2018-01-15 DIAGNOSIS — C169 Malignant neoplasm of stomach, unspecified: Secondary | ICD-10-CM

## 2018-01-15 DIAGNOSIS — Z452 Encounter for adjustment and management of vascular access device: Secondary | ICD-10-CM | POA: Insufficient documentation

## 2018-01-15 DIAGNOSIS — R109 Unspecified abdominal pain: Secondary | ICD-10-CM

## 2018-01-15 DIAGNOSIS — R531 Weakness: Secondary | ICD-10-CM | POA: Insufficient documentation

## 2018-01-15 DIAGNOSIS — Z7982 Long term (current) use of aspirin: Secondary | ICD-10-CM

## 2018-01-15 DIAGNOSIS — I77819 Aortic ectasia, unspecified site: Secondary | ICD-10-CM | POA: Insufficient documentation

## 2018-01-15 DIAGNOSIS — I2510 Atherosclerotic heart disease of native coronary artery without angina pectoris: Secondary | ICD-10-CM

## 2018-01-15 DIAGNOSIS — Z95828 Presence of other vascular implants and grafts: Secondary | ICD-10-CM

## 2018-01-15 DIAGNOSIS — D509 Iron deficiency anemia, unspecified: Secondary | ICD-10-CM | POA: Diagnosis not present

## 2018-01-15 DIAGNOSIS — R110 Nausea: Secondary | ICD-10-CM | POA: Insufficient documentation

## 2018-01-15 DIAGNOSIS — R079 Chest pain, unspecified: Secondary | ICD-10-CM | POA: Insufficient documentation

## 2018-01-15 DIAGNOSIS — E43 Unspecified severe protein-calorie malnutrition: Secondary | ICD-10-CM | POA: Diagnosis not present

## 2018-01-15 DIAGNOSIS — R634 Abnormal weight loss: Secondary | ICD-10-CM | POA: Insufficient documentation

## 2018-01-15 DIAGNOSIS — C163 Malignant neoplasm of pyloric antrum: Secondary | ICD-10-CM

## 2018-01-15 DIAGNOSIS — Z7189 Other specified counseling: Secondary | ICD-10-CM

## 2018-01-15 DIAGNOSIS — I700 Atherosclerosis of aorta: Secondary | ICD-10-CM | POA: Diagnosis not present

## 2018-01-15 DIAGNOSIS — I10 Essential (primary) hypertension: Secondary | ICD-10-CM | POA: Insufficient documentation

## 2018-01-15 DIAGNOSIS — Z79899 Other long term (current) drug therapy: Secondary | ICD-10-CM | POA: Diagnosis not present

## 2018-01-15 LAB — COMPREHENSIVE METABOLIC PANEL
ALK PHOS: 128 U/L (ref 40–150)
ALT: 50 U/L (ref 0–55)
ANION GAP: 10 (ref 3–11)
AST: 35 U/L — ABNORMAL HIGH (ref 5–34)
Albumin: 3.3 g/dL — ABNORMAL LOW (ref 3.5–5.0)
BUN: 53 mg/dL — ABNORMAL HIGH (ref 7–26)
CALCIUM: 9 mg/dL (ref 8.4–10.4)
CHLORIDE: 107 mmol/L (ref 98–109)
CO2: 23 mmol/L (ref 22–29)
CREATININE: 0.82 mg/dL (ref 0.70–1.30)
Glucose, Bld: 104 mg/dL (ref 70–140)
Potassium: 5 mmol/L (ref 3.5–5.1)
SODIUM: 140 mmol/L (ref 136–145)
Total Bilirubin: 0.3 mg/dL (ref 0.2–1.2)
Total Protein: 7.4 g/dL (ref 6.4–8.3)

## 2018-01-15 LAB — IRON AND TIBC
Iron: 64 ug/dL (ref 42–163)
Saturation Ratios: 19 % — ABNORMAL LOW (ref 42–163)
TIBC: 331 ug/dL (ref 202–409)
UIBC: 267 ug/dL

## 2018-01-15 LAB — CBC WITH DIFFERENTIAL (CANCER CENTER ONLY)
BASOS ABS: 0 10*3/uL (ref 0.0–0.1)
Basophils Relative: 0 %
Eosinophils Absolute: 0.1 10*3/uL (ref 0.0–0.5)
Eosinophils Relative: 1 %
HEMATOCRIT: 39 % (ref 38.4–49.9)
Hemoglobin: 12.9 g/dL — ABNORMAL LOW (ref 13.0–17.1)
LYMPHS PCT: 27 %
Lymphs Abs: 2 10*3/uL (ref 0.9–3.3)
MCH: 30.1 pg (ref 27.2–33.4)
MCHC: 33.1 g/dL (ref 32.0–36.0)
MCV: 90.9 fL (ref 79.3–98.0)
MONO ABS: 0.9 10*3/uL (ref 0.1–0.9)
MONOS PCT: 12 %
NEUTROS ABS: 4.6 10*3/uL (ref 1.5–6.5)
Neutrophils Relative %: 60 %
PLATELETS: 203 10*3/uL (ref 140–400)
RBC: 4.29 MIL/uL (ref 4.20–5.82)
RDW: 20 % — AB (ref 11.0–15.6)
WBC Count: 7.6 10*3/uL (ref 4.0–10.3)

## 2018-01-15 LAB — RETICULOCYTES
RBC.: 4.29 MIL/uL (ref 4.20–5.82)
RETIC COUNT ABSOLUTE: 64.4 10*3/uL (ref 34.8–93.9)
Retic Ct Pct: 1.5 % (ref 0.8–1.8)

## 2018-01-15 LAB — FERRITIN: FERRITIN: 238 ng/mL (ref 22–316)

## 2018-01-15 MED ORDER — SODIUM CHLORIDE 0.9% FLUSH
10.0000 mL | INTRAVENOUS | Status: DC | PRN
  Administered 2018-01-15: 10 mL via INTRAVENOUS
  Filled 2018-01-15: qty 10

## 2018-01-15 MED ORDER — PALONOSETRON HCL INJECTION 0.25 MG/5ML
INTRAVENOUS | Status: AC
  Filled 2018-01-15: qty 5

## 2018-01-15 MED ORDER — OXALIPLATIN CHEMO INJECTION 100 MG/20ML
70.0000 mg/m2 | Freq: Once | INTRAVENOUS | Status: AC
  Administered 2018-01-15: 115 mg via INTRAVENOUS
  Filled 2018-01-15: qty 3

## 2018-01-15 MED ORDER — DEXTROSE 5 % IV SOLN
Freq: Once | INTRAVENOUS | Status: AC
  Administered 2018-01-15: 12:00:00 via INTRAVENOUS

## 2018-01-15 MED ORDER — DEXTROSE 5 % IV SOLN
400.0000 mg/m2 | Freq: Once | INTRAVENOUS | Status: AC
  Administered 2018-01-15: 648 mg via INTRAVENOUS
  Filled 2018-01-15: qty 32.4

## 2018-01-15 MED ORDER — PALONOSETRON HCL INJECTION 0.25 MG/5ML
0.2500 mg | Freq: Once | INTRAVENOUS | Status: AC
  Administered 2018-01-15: 0.25 mg via INTRAVENOUS

## 2018-01-15 MED ORDER — SODIUM CHLORIDE 0.9 % IV SOLN
2400.0000 mg/m2 | INTRAVENOUS | Status: DC
  Administered 2018-01-15: 3900 mg via INTRAVENOUS
  Filled 2018-01-15: qty 78

## 2018-01-15 MED ORDER — DEXAMETHASONE SODIUM PHOSPHATE 10 MG/ML IJ SOLN
INTRAMUSCULAR | Status: AC
  Filled 2018-01-15: qty 1

## 2018-01-15 MED ORDER — DEXAMETHASONE SODIUM PHOSPHATE 10 MG/ML IJ SOLN
10.0000 mg | Freq: Once | INTRAMUSCULAR | Status: AC
  Administered 2018-01-15: 10 mg via INTRAVENOUS

## 2018-01-15 NOTE — Patient Instructions (Signed)
[facility] Discharge Instructions for Patients Receiving Chemotherapy  Today you received the following chemotherapy agents: Oxaliplatin, Leucovorin, and 5FU.  To help prevent nausea and vomiting after your treatment, we encourage you to take your nausea medication as directed.   If you develop nausea and vomiting that is not controlled by your nausea medication, call the clinic.   BELOW ARE SYMPTOMS THAT SHOULD BE REPORTED IMMEDIATELY:  *FEVER GREATER THAN 100.5 F  *CHILLS WITH OR WITHOUT FEVER  NAUSEA AND VOMITING THAT IS NOT CONTROLLED WITH YOUR NAUSEA MEDICATION  *UNUSUAL SHORTNESS OF BREATH  *UNUSUAL BRUISING OR BLEEDING  TENDERNESS IN MOUTH AND THROAT WITH OR WITHOUT PRESENCE OF ULCERS  *URINARY PROBLEMS  *BOWEL PROBLEMS  UNUSUAL RASH Items with * indicate a potential emergency and should be followed up as soon as possible.  Feel free to call the clinic should you have any questions or concerns. The clinic phone number is [phone].  Please show the CHEMO ALERT CARD at check-in to the Emergency Department and triage nurse.    

## 2018-01-15 NOTE — Progress Notes (Signed)
Nutrition follow-up completed with patient and his wife, during infusion for metastatic gastric cancer. Weight documented as 118.5 pounds decreased from 123.5 pounds January 2. Noted labs, albumin 3.3 and BUN 53. Patient and MD concerned because his jejunostomy feeding tube is leaking. MD is considering having feeding Tube replaced and/or just using gastric tube for feeding.  Estimated nutrition needs: 2000-2200 calories, 75-90 grams protein, 2.2 L fluid.  Nutrition diagnosis: Inadequate enteral nutrition infusion continues.  Intervention: Working with Therapist, sports, we examined patient's jejunostomy feeding tube. Tube was pulled away from the stomach and dressing was damp. Gastric tube appeared to be fitted appropriately to patient's stomach and there was no leaking on dressing around gastric tube. Requested Sandi Mealy, PA, to look at tube.  He was able to tighten the jejunostomy feeding tube's bumper closer to the skin. Both tubes were flushed with water and there was no leaking observed. Patient would like to resume Osmolite 1.5 at 65 mL an hour for 8 hours +85 mL an hour for 8 hours via Jejunostomy tube. Tube feeding providing 1800 calories and 75 grams protein and 914 mL free water. Patient also trying to drink Ensure Plus by mouth providing an additional 350 calories and 13 grams protein. TF and Ensure Plus meeting estimated nutrition needs.  He denies problems after by mouth intake, indicating we possibly could use patient's gastric tube for tube feedings.  Monitoring, evaluation, goals: Patient will tolerate tube feeding plus oral intake to minimize further weight loss and promote weight gain/Healing.  Next visit: Wednesday, January 30, during infusion. I will contact patient by phone to check on his tube.  **Disclaimer: This note was dictated with voice recognition software. Similar sounding words can inadvertently be transcribed and this note may contain transcription errors which may not  have been corrected upon publication of note.**

## 2018-01-17 ENCOUNTER — Inpatient Hospital Stay: Payer: Medicare Other | Admitting: Nutrition

## 2018-01-17 ENCOUNTER — Telehealth: Payer: Self-pay | Admitting: Hematology

## 2018-01-17 ENCOUNTER — Encounter: Payer: Self-pay | Admitting: Hematology

## 2018-01-17 ENCOUNTER — Inpatient Hospital Stay: Payer: Medicare Other

## 2018-01-17 VITALS — BP 122/64 | HR 81 | Temp 98.2°F | Resp 17

## 2018-01-17 DIAGNOSIS — Z452 Encounter for adjustment and management of vascular access device: Secondary | ICD-10-CM | POA: Diagnosis not present

## 2018-01-17 DIAGNOSIS — Z931 Gastrostomy status: Secondary | ICD-10-CM | POA: Diagnosis not present

## 2018-01-17 DIAGNOSIS — C786 Secondary malignant neoplasm of retroperitoneum and peritoneum: Secondary | ICD-10-CM | POA: Diagnosis not present

## 2018-01-17 DIAGNOSIS — C163 Malignant neoplasm of pyloric antrum: Secondary | ICD-10-CM | POA: Diagnosis not present

## 2018-01-17 DIAGNOSIS — K9423 Gastrostomy malfunction: Secondary | ICD-10-CM | POA: Diagnosis not present

## 2018-01-17 DIAGNOSIS — Z5111 Encounter for antineoplastic chemotherapy: Secondary | ICD-10-CM | POA: Diagnosis not present

## 2018-01-17 DIAGNOSIS — Z7189 Other specified counseling: Secondary | ICD-10-CM

## 2018-01-17 MED ORDER — HEPARIN SOD (PORK) LOCK FLUSH 100 UNIT/ML IV SOLN
500.0000 [IU] | Freq: Once | INTRAVENOUS | Status: AC | PRN
  Administered 2018-01-17: 500 [IU]
  Filled 2018-01-17: qty 5

## 2018-01-17 MED ORDER — SODIUM CHLORIDE 0.9% FLUSH
10.0000 mL | INTRAVENOUS | Status: DC | PRN
  Administered 2018-01-17: 10 mL
  Filled 2018-01-17: qty 10

## 2018-01-17 NOTE — Telephone Encounter (Signed)
Spoke to patient regarding upcoming January and February appointments.

## 2018-01-22 ENCOUNTER — Telehealth: Payer: Self-pay | Admitting: Nutrition

## 2018-01-22 NOTE — Telephone Encounter (Signed)
Called patient to follow up on feeding tube leakage. I have been unable to reach him but left a message for him to call me back.

## 2018-01-28 NOTE — Progress Notes (Signed)
[place]  Telephone:([phone] [phone]) [phone]  Clinic Follow up Note   Patient Care Team: [name], MD as PCP - General (Internal Medicine) [name], MD as Consulting Physician (General Surgery) [name], MD as Consulting Physician (Hematology) [name], NP as Nurse Practitioner (Nurse Practitioner)   Date of Service:  01/29/2018  CHIEF COMPLAINT: F/u metastatic gastric cancer   SUMMARY OF ONCOLOGIC HISTORY:   Gastric cancer ([place])   11/09/2017 Imaging    CT A/P IMPRESSION: 1. Circumferential wall thickening involving the gastric antrum with marked distension of the stomach suggestive of an element of gastric outlet obstruction. Further evaluation with endoscopy is recommended. 2. Short-segment occlusion involving the left lower lobe lateral basal subsegmental bronchus, nonspecific though could be indicative of aspiration in the setting of suspected gastric outlet obstruction. Clinical correlation is advised. 3. Small amount of fluid seen with the pelvic cul-de-sac, presumably reactive. 4. Mild ectasia of the abdominal aorta measuring 2.7 cm in diameter. Recommend follow-up aortic ultrasound in 5 years. This recommendation follows ACR consensus guidelines: White Paper of the ACR Incidental Findings Committee II on Vascular Findings. J Am Coll Radiol 2013; 16:606-301. 5. Aortic Atherosclerosis (ICD10-I70.0). Suspected hemodynamically significant stenoses involving the bilateral external iliac arteries and the right common femoral artery. Correlation for lower extremity PAD symptoms is recommended. Further evaluation with the acquisition of ABIs could be performed as indicated.      11/10/2017 Procedure    Upper endoscopy Findings: per Dr. Hilarie Fredrickson Mild esophagitis with no bleeding was found in the lower third of the esophagus. This is likely from reflux in the setting of partial gastric outlet obstruction. Retained fluid and food  debris was found in the gastric body. This was aspirated via the suction channel of the endoscope but was unable to be fully cleared due to semi-solid material. 1800 mL was removed. Diffuse moderate inflammation characterized by erythema and granularity was found in the cardia, in the gastric fundus and in the gastric body. A large, fungating and friable mass with no active bleeding was found at the incisura and in the gastric antrum. The mass was covered with mucus. This was biopsied extensively with a cold forceps for histology. Attempt made to maneuver around this mass and find the pylorus/duodenum, however given retained food/food in the stomach and lack of airway protection, this was not possible. An examination of the duodenum was not performed.      11/10/2017 Pathology Results    Diagnosis Stomach, biopsy, gastric mass - ADENOCARCINOMA - SEE COMMENT  Results: HER2 - NEGATIVE RATIO OF HER2/CEP17 SIGNALS 1.92 AVERAGE HER2 COPY NUMBER PER CELL 2.45  Microscopic Comment Based on the biopsy the carcinoma is well-differentiated. Dr. Gari Crown reviewed the case and agrees with the above diagnosis. Dr. Hilarie Fredrickson was notified of these results on November 12, 2017. Her-2 is pending and will be reported in an addendum. Warthin-Starry is negative for H. pylori. 1 of      11/12/2017 Pathology Results    Diagnosis Peritoneum, biopsy - METASTATIC ADENOCARCINOMA. Microscopic Comment The morphologic appearance is similar to previously diagnosed gastric adenocarcinoma ([id number]). (BNS:ah/gt, 11/13/17)      11/12/2017 Surgery    PROCEDURE:   Diagnostic Laparoscopy with biopsy, gastojejunostomy, insertion of G-tube and J-tube   Surgeon(s): [name], MD        57/32/2025 Initial Diagnosis    Gastric cancer ([place])      11/19/2017 Imaging    CT CHEST IMPRESSION: 1. No evidence of metastatic disease to  the thorax. 2. Sequela of old granulomatous disease, as above. 3. Aortic  atherosclerosis, in addition to left main and 3 vessel coronary artery disease. 4. Ectasia of the ascending thoracic aorta (4 cm in diameter). Recommend annual imaging followup by CTA or MRA. This recommendation follows 2010 ACCF/AHA/AATS/ACR/ASA/SCA/SCAI/SIR/STS/SVM Guidelines for the Diagnosis and Management of Patients with Thoracic Aortic Disease. Circulation. 2010; 121: V956-L875. 5. There are calcifications of the aortic valve. Echocardiographic correlation for evaluation of potential valvular dysfunction may be warranted if clinically indicated. 6. Small volume of pneumoperitoneum noted in the visualized upper abdomen, presumably related to recent gastrojejunostomy performed on 11/12/2017. However, clinical correlation is recommended to exclude any signs are symptoms concerning for perforation.      11/20/2017 Procedure    PAC placement      12/18/2017 -  Chemotherapy    FOLFOX every 2 weeks for 6 cycles starting 12/191/8        History of present illness:    Lee Pratt was seen in the hospital due to symptomatic anemia and abdominal pain. He reports that he initially had symptoms of decreased appetite, weight loss of 10-20 lbs, and intermittent abdominal pain. Wife reports the patient having emesis. He reports that his energy levels in the past couple of months has declined. Wife reports that the patient has had generalized weakness. He notes that for 2 months he has had these symptoms and that prior to that his energy level was good and he used to walk everyday. He denies doing anything else besides walking prior to his recent diagnosis. Wife reports that the patient has a hx of HTN and only takes ASA. He denies MI or any other medical issues. 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 He has two children who live in [place]. Wife states that they moved to [place] due to the cold weather and they do not know any family members locally. Wife reports that  they go to church, but they do not drive.   He had a CT AP completed on 11/09/2017 with results of: IMPRESSION: 1. Circumferential wall thickening involving the gastric antrum with marked distension of the stomach suggestive of an element of gastric outlet obstruction. Further evaluation with endoscopy is recommended. 2. Short-segment occlusion involving the left lower lobe lateral basal subsegmental bronchus, nonspecific though could be indicative of aspiration in the setting of suspected gastric outlet obstruction. Clinical correlation is advised. 3. Small amount of fluid seen with the pelvic cul-de-sac, presumably reactive. 4. Mild ectasia of the abdominal aorta measuring 2.7 cm in diameter. Recommend follow-up aortic ultrasound in 5 years. This recommendation follows ACR consensus guidelines: White Paper of the ACR Incidental Findings Committee II on Vascular Findings. J Am Coll Radiol 2013; 64:332-951. 5. Aortic Atherosclerosis (ICD10-I70.0). Suspected hemodynamically significant stenoses involving the bilateral external iliac arteries and the right common femoral artery. Correlation for lower extremity PAD symptoms is recommended. Further evaluation with the acquisition of ABIs could be performed as indicated.  He was evaluated in [facility] and admitted for abdominal pain, anemia, and partial gastric outlet obstruction on 11/09/2017. He also had weight loss and dark colored stool during his ED visit on 11/09/2017 prior to being admitted to the hospital. He had an EGD completed on 11/10/2017 which was performed by Dr. Zenovia Jarred with pathology results showing: adenocarcinoma of gastric mass. Subsequently, he had a diagnostic larproscopic with biopsy, gastrojejunostomy, insertion of G and J tube performed by Dr. [name] on 88/41/6606  with pathology results of peritoneum, biopsy with metastatic adenocarcinoma. During his admission, he has received IV feraheme twice on 11/12/2017 and 11/15/2017 due to  symptomatic anemia.     CURRENT THERAPY: first line FOLFOX every 2 weeks started 12/18/17   INTERVAL HISTORY:  Rendell Thivierge returns is here for a follow up and cycle 4 of FOLFOX. He presents to the clinic room today accompanied by his wife. He reports he did well with Cycle 3 with some nausea but he has had normal bowel movements. He reports he had some chest pain 3-4 times last week. He states the pain lasts a few seconds and shoots from his left chest to his right chest. He has not had any pain this week.    He is only using the feeding tube and is not eating by mouth currently. His wife states that he is cared to try food by mouth. He states his feeding tube is not leaking anymore.   On review of systems, pt denies SOB, or any other complaints at this time. Pertinent positives are listed and detailed within the above HPI.    REVIEW OF SYSTEMS:  Constitutional: Denies fevers, chills (+) loss 7 pounds.  (+) low food intake   Ears, nose, mouth, throat, and face: Denies mucositis or sore throat (+) hard of hearing  Respiratory: Denies dyspnea or wheezes Cardiovascular: Denies palpitation, chest discomfort or lower extremity swelling Gastrointestinal:  Denies emesis, diarrhea, dysphagia, bloating, early satiety, heartburn, abdominal pain, or blood in stool  (+) RLQ PEG and J tube (+) nausea Skin: Denies abnormal skin rashes Lymphatics: Denies new lymphadenopathy or easy bruising Neurological:Denies numbness, tingling or new weaknesses Behavioral/Psych: Mood is stable, no new changes  All other systems were reviewed with the patient and are negative.  MEDICAL HISTORY:  Past Medical History:  Diagnosis Date  . Hyperglycemia 03/16/2013  . Hypertension   . Stomach cancer [facility])     SURGICAL HISTORY: Past Surgical History:  Procedure Laterality Date  . ESOPHAGOGASTRODUODENOSCOPY N/A 11/10/2017   Procedure: ESOPHAGOGASTRODUODENOSCOPY (EGD);  Surgeon: Jerene Bears, MD;  Location: [facility];   Service: Gastroenterology;  Laterality: N/A;  . GASTROJEJUNOSTOMY N/A 11/12/2017   Procedure: Diagnostic Laparoscopy with biopsy, gastojejunostomy, insertion of G-tube and J-tube;  Surgeon: [name], MD;  Location: [facility];  Service: General;  Laterality: N/A;  . NO PAST SURGERIES  12/21/2011  . PORTACATH PLACEMENT Left 11/20/2017   Procedure: INSERTION PORT-A-CATH;  Surgeon: Ralene Ok, MD;  Location: [facility];  Service: General;  Laterality: Left;    I have reviewed the social history and family history with the patient and they are unchanged from previous note.  ALLERGIES:  has No Known Allergies.  MEDICATIONS:  Current Outpatient Medications  Medication Sig Dispense Refill  . aspirin EC 81 MG tablet Take 81 mg by mouth daily.    Marland Kitchen CARTIA XT 120 MG 24 hr capsule Take 120 mg daily as needed by mouth (Systolic BP > 852/77).     . docusate sodium (COLACE) 100 MG capsule Take 1 capsule (100 mg total) by mouth 2 (two) times daily as needed for mild constipation. 30 capsule 1  . lidocaine-prilocaine (EMLA) cream Apply to affected area once 30 g 3  . ondansetron (ZOFRAN ODT) 8 MG disintegrating tablet Take 1 tablet (8 mg total) by mouth 2 (two) times daily as needed for nausea or vomiting. 20 tablet 1  . ondansetron (ZOFRAN) 8 MG tablet Take 1 tablet (8 mg total) by mouth 2 (two)  times daily as needed for refractory nausea / vomiting. Start on day 3 after chemotherapy. 30 tablet 1  . prochlorperazine (COMPAZINE) 10 MG tablet Take 1 tablet (10 mg total) by mouth every 6 (six) hours as needed (Nausea or vomiting). 30 tablet 1   No current facility-administered medications for this visit.    Facility-Administered Medications Ordered in Other Visits  Medication Dose Route Frequency Provider Last Rate Last Dose  . fluorouracil (ADRUCIL) 3,900 mg in sodium chloride 0.9 % 72 mL chemo infusion  2,400 mg/m2 (Treatment Plan Recorded) Intravenous 1 day or 1 dose [name], MD   3,900 mg at  01/29/18 1548    PHYSICAL EXAMINATION: ECOG PERFORMANCE STATUS: 2 - Symptomatic, <50% confined to bed Blood pressure 129/75, pulse 89, respiratory rate 16, temperature 36.6, pulse ox 100% on room air, weight 123 pounds GENERAL:alert, no distress and comfortable SKIN: skin color, texture are normal, no rashes or significant lesions (+) dry mucous membranes  EYES: normal, Conjunctiva are pink and non-injected, sclera clear OROPHARYNX:no exudate, no erythema and lips, buccal mucosa, and tongue normal  NECK: supple, thyroid normal size, non-tender, without nodularity LYMPH:  no palpable cervical, supra/infraclavicular, axillary, or inguinal lymphadenopathy  LUNGS: clear to auscultation bilaterally with normal breathing effort HEART: regular rate & rhythm and no murmurs and no lower extremity edema ABDOMEN:abdomen soft, non-tender and normal bowel sounds (+) midline abdominal incision healed well, no erythema or discharge (+) RLQ PEG and J tube, no drainage or erythema. Looks clean Musculoskeletal:no cyanosis of digits and no clubbing  NEURO: alert & oriented x 3 with fluent speech, no focal motor/sensory deficits PAC without erythema    LABORATORY DATA:  I have reviewed the data as listed CBC Latest Ref Rng & Units 01/29/2018 01/15/2018 01/01/2018  WBC 4.0 - 10.3 K/uL 4.9 7.6 6.2  Hemoglobin 13.0 - 17.1 g/dL - - 11.1(L)  Hematocrit 38.4 - 49.9 % 33.8(L) 39.0 34.3(L)  Platelets 140 - 400 K/uL 207 203 330     CMP Latest Ref Rng & Units 01/29/2018 01/15/2018 01/01/2018  Glucose 70 - 140 mg/dL 144(H) 104 98  BUN 7 - 26 mg/dL 30(H) 53(H) 27.7(H)  Creatinine 0.70 - 1.30 mg/dL 0.77 0.82 0.8  Sodium 136 - 145 mmol/L 136 140 139  Potassium 3.5 - 5.1 mmol/L 4.3 5.0 4.2  Chloride 98 - 109 mmol/L 101 107 -  CO2 22 - 29 mmol/L _0 Calcium 8.4 - 10.4 mg/dL 8.8 9.0 8.7  Total Protein 6.4 - 8.3 g/dL 7.0 7.4 7.1  Total Bilirubin 0.2 - 1.2 mg/dL 0.3 0.3 0.22  Alkaline Phos 40 - 150 U/L 144 128 99    AST 5 - 34 U/L 33 35(H) 35(H)  ALT 0 - 55 U/L 45 50 42   11/12/17 Diagnosis Peritoneum, biopsy - METASTATIC ADENOCARCINOMA  11/10/17 Diagnosis Stomach, biopsy, gastric mass - ADENOCARCINOMA - SEE COMMENT HER2 - NEGATIVE      RADIOGRAPHIC STUDIES: I have personally reviewed the radiological images as listed and agreed with the findings in the report. No results found.   11/19/17 CT CHEST IMPRESSION: 1. No evidence of metastatic disease to the thorax. 2. Sequela of old granulomatous disease, as above. 3. Aortic atherosclerosis, in addition to left main and 3 vessel coronary artery disease. 4. Ectasia of the ascending thoracic aorta (4 cm in diameter). Recommend annual imaging followup by CTA or MRA. This recommendation follows 2010 ACCF/AHA/AATS/ACR/ASA/SCA/SCAI/SIR/STS/SVM Guidelines for the Diagnosis and Management of Patients with Thoracic Aortic Disease. Circulation. 2010;  121: [id number]. 5. There are calcifications of the aortic valve. Echocardiographic correlation for evaluation of potential valvular dysfunction may be warranted if clinically indicated. 6. Small volume of pneumoperitoneum noted in the visualized upper abdomen, presumably related to recent gastrojejunostomy performed on 11/12/2017. However, clinical correlation is recommended to exclude any signs are symptoms concerning for perforation.  Aortic Atherosclerosis (ICD10-I70.0).  11/09/17 CT CAP IMPRESSION: 1. Circumferential wall thickening involving the gastric antrum with marked distension of the stomach suggestive of an element of gastric outlet obstruction. Further evaluation with endoscopy is recommended. 2. Short-segment occlusion involving the left lower lobe lateral basal subsegmental bronchus, nonspecific though could be indicative of aspiration in the setting of suspected gastric outlet obstruction. Clinical correlation is advised. 3. Small amount of fluid seen with the pelvic cul-de-sac,  presumably reactive. 4. Mild ectasia of the abdominal aorta measuring 2.7 cm in diameter. Recommend follow-up aortic ultrasound in 5 years. This recommendation follows ACR consensus guidelines: White Paper of the ACR Incidental Findings Committee II on Vascular Findings. J Am Coll Radiol 2013; 23:536-144. 5. Aortic Atherosclerosis (ICD10-I70.0). Suspected hemodynamically significant stenoses involving the bilateral external iliac arteries and the right common femoral artery. Correlation for lower extremity PAD symptoms is recommended. Further evaluation with the acquisition of ABIs could be performed as indicated.   ASSESSMENT & PLAN: 84 y.o. male with PMH of hypertension, but otherwise healthy, a retired Licensed conveyancer, presented with abdominal pain, nausea, anorexia and weight loss.  1.  Gastric adenocarcinoma with peritoneal metastasis, stage IV, HER2(-), PD-L1 (-), MSI-stable  -We previously reviewed imaging, surgical findings, and pathology results. We reviewed that his cancer is not curable at this stage and the goal of therapy is to prolong his life and improve quality of life. -He received IV Feraheme x2 in the hospital, anemia improved. Will check iron studies every Month. -Patient refuses to use interpreter service, I was not very sure if he completely understood our discussion, given the complexity of his diagnosis and management options. I have spoken with his son Cristie Hem, who is also a physician to help him understand his diagnosis, prognosis and treatment  -I discussed his FO genomic testing results. His PDL-1 results show CPS 0, HER2 Negative and MS-Stable. He is not an immunotherapy candidate at this time, and would not benefit from Herceptin   -He has recovered well since hospital discharge, gained weight back, overall feels well, ready to start chemo. -the goal of therapy is palliative to prolong his life, and reserve his QOL -He started FOLFOX on 12/18/17 and tolerated  first dose well overall with dose reduction. He had only one episode of emesis.  -I previously encouraged him to increase his tube feeding by1-2 hours/day and increase in liquid diet by mouth.  -I slightly increased his FOLFOX dose with 2nd cycle and he had bit more side effects, overall manageable  -Labs adequate to proceed with cycle 4 today 01/29/18 at same dose  - CT Scan after this cycle -I advised him to exercise and to be physically active.  I also encouraged him to try soft diet and liquids by mouth. -F/u in 2 weeks  2.  Gastric outlet syndrome from cancer, status post gastro jejunostomy and PEG placement  -We discussed venting PEG tube PRN if he becomes symptomatic with bloating or abdominal discomfort. Placed urgent dietician referral to establish care and monitor closely.  -For mild nausea and constipation he was prescribed zofran ODT and colace PRN, prescription given to patient on 12/5 -I previously reviewed how  to use zofran if he experiences nausea.   3.  Severe malnutrition, status post J-tube placement for nutrition -He is still severely malnourished. He continues J tube feeding at 50 m//hr for 16 total hours per day, broken up into 8 hour increments; he is to increase rate as tolerated. Home care assists with this.  -He saw Dr. Marcello Moores 12/03/17 who instructed him to advance to liquid diet, we encouraged this. -He has gained some weight back. I encouraged him to continue liquid diet, tube feeding and supplement drink -he had J tube leaking issue, resolved after we advanced a little more  -J tube is no longer loose or leaking as of 01/29/18 -I recommended he'd try eating soup, he says he will get some baby food this week and try that  4.  Severe iron deficient anemia - improved, status post blood transfusion and IV Feraheme X2 -Iron study from 12/18/17 show Hg at 11.1, Ferritin at 394, iron at 28 and Sat at 10%. -We will continue monitor his iron level -anemia overall much  improved after IV iron   5. Hypertension  -he is on cartia  -We discussed the potential impact on his blood pressure from chemotherapy, will monitor closely  6. Goal of care discussion  -We again discussed the incurable nature of his/her cancer, and the overall poor prognosis, especially if he/she does not have good response to chemotherapy or progress on chemo -The patient understands the goal of care is palliative. -I recommend DNR/DNI, he will think about it -Son lives in [place] and the son ultimately plans to move his parents in with him. In the meantime pt needs assistance  -Social work has seen him    Plan: Lab reviewed, will proceed cycle 4 FOLFOX  CT CAP w contrast in before next visit in 2 weeks  Lab, flush, F/u and chemo in 2 weeks    No orders of the defined types were placed in this encounter.  All questions were answered. The patient knows to call the clinic with any problems, questions or concerns. No barriers to learning was detected.  I spent 20 minutes counseling the patient face to face. The total time spent in the appointment was 25 minutes and more than 50% was on counseling, review of test results, and coordination of care.   [name]  01/29/2018   This document serves as a record of services personally performed by [name], MD. It was created on her behalf by Theresia Bough, a trained medical scribe. The creation of this record is based on the scribe's personal observations and the provider's statements to them.   I have reviewed the above documentation for accuracy and completeness, and I agree with the above.

## 2018-01-29 ENCOUNTER — Inpatient Hospital Stay: Payer: Medicare Other

## 2018-01-29 ENCOUNTER — Encounter: Payer: Medicare Other | Admitting: Nutrition

## 2018-01-29 ENCOUNTER — Telehealth: Payer: Self-pay

## 2018-01-29 ENCOUNTER — Inpatient Hospital Stay: Payer: Medicare Other | Admitting: Nutrition

## 2018-01-29 ENCOUNTER — Encounter: Payer: Self-pay | Admitting: Hematology

## 2018-01-29 ENCOUNTER — Inpatient Hospital Stay (HOSPITAL_BASED_OUTPATIENT_CLINIC_OR_DEPARTMENT_OTHER): Payer: Medicare Other | Admitting: Hematology

## 2018-01-29 VITALS — BP 112/60 | HR 99 | Resp 17 | Ht 69.0 in | Wt 121.7 lb

## 2018-01-29 DIAGNOSIS — C163 Malignant neoplasm of pyloric antrum: Secondary | ICD-10-CM | POA: Diagnosis not present

## 2018-01-29 DIAGNOSIS — E440 Moderate protein-calorie malnutrition: Secondary | ICD-10-CM

## 2018-01-29 DIAGNOSIS — C786 Secondary malignant neoplasm of retroperitoneum and peritoneum: Secondary | ICD-10-CM | POA: Diagnosis not present

## 2018-01-29 DIAGNOSIS — Z95828 Presence of other vascular implants and grafts: Secondary | ICD-10-CM

## 2018-01-29 DIAGNOSIS — I10 Essential (primary) hypertension: Secondary | ICD-10-CM

## 2018-01-29 DIAGNOSIS — R110 Nausea: Secondary | ICD-10-CM | POA: Diagnosis not present

## 2018-01-29 DIAGNOSIS — I77819 Aortic ectasia, unspecified site: Secondary | ICD-10-CM

## 2018-01-29 DIAGNOSIS — C169 Malignant neoplasm of stomach, unspecified: Secondary | ICD-10-CM

## 2018-01-29 DIAGNOSIS — Z5111 Encounter for antineoplastic chemotherapy: Secondary | ICD-10-CM | POA: Diagnosis not present

## 2018-01-29 DIAGNOSIS — I700 Atherosclerosis of aorta: Secondary | ICD-10-CM | POA: Diagnosis not present

## 2018-01-29 DIAGNOSIS — D509 Iron deficiency anemia, unspecified: Secondary | ICD-10-CM

## 2018-01-29 DIAGNOSIS — Z931 Gastrostomy status: Secondary | ICD-10-CM | POA: Diagnosis not present

## 2018-01-29 DIAGNOSIS — Z452 Encounter for adjustment and management of vascular access device: Secondary | ICD-10-CM | POA: Diagnosis not present

## 2018-01-29 DIAGNOSIS — Z7982 Long term (current) use of aspirin: Secondary | ICD-10-CM

## 2018-01-29 DIAGNOSIS — Z79899 Other long term (current) drug therapy: Secondary | ICD-10-CM

## 2018-01-29 DIAGNOSIS — E43 Unspecified severe protein-calorie malnutrition: Secondary | ICD-10-CM | POA: Diagnosis not present

## 2018-01-29 DIAGNOSIS — R079 Chest pain, unspecified: Secondary | ICD-10-CM

## 2018-01-29 DIAGNOSIS — R531 Weakness: Secondary | ICD-10-CM | POA: Diagnosis not present

## 2018-01-29 DIAGNOSIS — I2510 Atherosclerotic heart disease of native coronary artery without angina pectoris: Secondary | ICD-10-CM

## 2018-01-29 DIAGNOSIS — I77811 Abdominal aortic ectasia: Secondary | ICD-10-CM | POA: Diagnosis not present

## 2018-01-29 DIAGNOSIS — Z7189 Other specified counseling: Secondary | ICD-10-CM

## 2018-01-29 DIAGNOSIS — R630 Anorexia: Secondary | ICD-10-CM

## 2018-01-29 DIAGNOSIS — K9423 Gastrostomy malfunction: Secondary | ICD-10-CM | POA: Diagnosis not present

## 2018-01-29 LAB — CBC WITH DIFFERENTIAL (CANCER CENTER ONLY)
Basophils Absolute: 0 10*3/uL (ref 0.0–0.1)
Basophils Relative: 1 %
EOS ABS: 0.1 10*3/uL (ref 0.0–0.5)
Eosinophils Relative: 2 %
HEMATOCRIT: 33.8 % — AB (ref 38.4–49.9)
HEMOGLOBIN: 11.1 g/dL — AB (ref 13.0–17.1)
LYMPHS ABS: 1.2 10*3/uL (ref 0.9–3.3)
LYMPHS PCT: 24 %
MCH: 29.8 pg (ref 27.2–33.4)
MCHC: 32.8 g/dL (ref 32.0–36.0)
MCV: 90.9 fL (ref 79.3–98.0)
MONOS PCT: 17 %
Monocytes Absolute: 0.8 10*3/uL (ref 0.1–0.9)
NEUTROS ABS: 2.8 10*3/uL (ref 1.5–6.5)
NEUTROS PCT: 56 %
Platelet Count: 207 10*3/uL (ref 140–400)
RBC: 3.72 MIL/uL — ABNORMAL LOW (ref 4.20–5.82)
RDW: 19.5 % — ABNORMAL HIGH (ref 11.0–14.6)
WBC: 4.9 10*3/uL (ref 4.0–10.3)

## 2018-01-29 LAB — COMPREHENSIVE METABOLIC PANEL
ALBUMIN: 2.6 g/dL — AB (ref 3.5–5.0)
ALT: 45 U/L (ref 0–55)
AST: 33 U/L (ref 5–34)
Alkaline Phosphatase: 144 U/L (ref 40–150)
Anion gap: 8 (ref 3–11)
BUN: 30 mg/dL — AB (ref 7–26)
CHLORIDE: 101 mmol/L (ref 98–109)
CO2: 27 mmol/L (ref 22–29)
Calcium: 8.8 mg/dL (ref 8.4–10.4)
Creatinine, Ser: 0.77 mg/dL (ref 0.70–1.30)
GFR calc Af Amer: >60 mL/min (ref >60)
GFR calc non Af Amer: >60 mL/min (ref >60)
GLUCOSE: 144 mg/dL — AB (ref 70–140)
POTASSIUM: 4.3 mmol/L (ref 3.5–5.1)
SODIUM: 136 mmol/L (ref 136–145)
Total Bilirubin: 0.3 mg/dL (ref 0.2–1.2)
Total Protein: 7 g/dL (ref 6.4–8.3)

## 2018-01-29 LAB — RETICULOCYTES
RBC.: 3.72 MIL/uL — ABNORMAL LOW (ref 4.20–5.82)
RETIC CT PCT: 1.5 % (ref 0.8–1.8)
Retic Count, Absolute: 55.8 10*3/uL (ref 34.8–93.9)

## 2018-01-29 MED ORDER — LEUCOVORIN CALCIUM INJECTION 350 MG
400.0000 mg/m2 | Freq: Once | INTRAMUSCULAR | Status: AC
  Administered 2018-01-29: 648 mg via INTRAVENOUS
  Filled 2018-01-29: qty 32.4

## 2018-01-29 MED ORDER — SODIUM CHLORIDE 0.9% FLUSH
10.0000 mL | INTRAVENOUS | Status: DC | PRN
  Administered 2018-01-29: 10 mL via INTRAVENOUS
  Filled 2018-01-29: qty 10

## 2018-01-29 MED ORDER — OXALIPLATIN CHEMO INJECTION 100 MG/20ML
70.0000 mg/m2 | Freq: Once | INTRAVENOUS | Status: AC
  Administered 2018-01-29: 115 mg via INTRAVENOUS
  Filled 2018-01-29: qty 23

## 2018-01-29 MED ORDER — PALONOSETRON HCL INJECTION 0.25 MG/5ML
0.2500 mg | Freq: Once | INTRAVENOUS | Status: AC
  Administered 2018-01-29: 0.25 mg via INTRAVENOUS

## 2018-01-29 MED ORDER — DEXTROSE 5 % IV SOLN
Freq: Once | INTRAVENOUS | Status: AC
  Administered 2018-01-29: 13:00:00 via INTRAVENOUS

## 2018-01-29 MED ORDER — SODIUM CHLORIDE 0.9 % IV SOLN
2400.0000 mg/m2 | INTRAVENOUS | Status: DC
  Administered 2018-01-29: 3900 mg via INTRAVENOUS
  Filled 2018-01-29: qty 78

## 2018-01-29 MED ORDER — DEXAMETHASONE SODIUM PHOSPHATE 10 MG/ML IJ SOLN
10.0000 mg | Freq: Once | INTRAMUSCULAR | Status: AC
  Administered 2018-01-29: 10 mg via INTRAVENOUS

## 2018-01-29 MED ORDER — PALONOSETRON HCL INJECTION 0.25 MG/5ML
INTRAVENOUS | Status: AC
  Filled 2018-01-29: qty 5

## 2018-01-29 MED ORDER — DEXAMETHASONE SODIUM PHOSPHATE 10 MG/ML IJ SOLN
INTRAMUSCULAR | Status: AC
  Filled 2018-01-29: qty 1

## 2018-01-29 NOTE — Telephone Encounter (Signed)
Printed avsm and calender for upcoming appointment. Per 1/30 los

## 2018-01-29 NOTE — Patient Instructions (Signed)
[facility] Discharge Instructions for Patients Receiving Chemotherapy  Today you received the following chemotherapy agents: Oxaliplatin, Leucovorin and Fluorouracil.  To help prevent nausea and vomiting after your treatment, we encourage you to take your nausea medication: Compazine.  Compazine. Take one every 6 hour as needed. If you develop nausea and vomiting that is not controlled by your nausea medication, call the clinic.   BELOW ARE SYMPTOMS THAT SHOULD BE REPORTED IMMEDIATELY:  *FEVER GREATER THAN 100.5 F  *CHILLS WITH OR WITHOUT FEVER  NAUSEA AND VOMITING THAT IS NOT CONTROLLED WITH YOUR NAUSEA MEDICATION  *UNUSUAL SHORTNESS OF BREATH  *UNUSUAL BRUISING OR BLEEDING  TENDERNESS IN MOUTH AND THROAT WITH OR WITHOUT PRESENCE OF ULCERS  *URINARY PROBLEMS  *BOWEL PROBLEMS  UNUSUAL RASH Items with * indicate a potential emergency and should be followed up as soon as possible.  Feel free to call the clinic should you have any questions or concerns. The clinic phone number is [phone].  Please show the [facility] at check-in to the Emergency Department and triage nurse.

## 2018-01-29 NOTE — Progress Notes (Signed)
Nutrition follow-up completed with patient during infusion for metastatic gastric cancer. Weight increased and documented as 121.7 pounds January 30. Labs reviewed. Patient is now tolerating jejunostomy feedings without tube leaking. Patient states he is not eating or drinking by mouth.  Estimated nutrition needs: 2000-2200 cal, 75-90 grams protein, 2.2 L fluid.  Nutrition diagnosis: Inadequate enteral nutrition infusion continues.  Intervention: Encouraged patient to continue Osmolite 1.5 at present rate. Tube feeding is providing 1800 cal, and 75 g of protein. Recommended patient begin soft foods to increase overall calories and protein. Reminded patient he could still consume Ensure Plus.  Monitoring, evaluation, goals: Patient will tolerate tube feedings plus oral intake to meet 100% of estimated needs.  Next visit:To be scheduled as needed.  **Disclaimer: This note was dictated with voice recognition software. Similar sounding words can inadvertently be transcribed and this note may contain transcription errors which may not have been corrected upon publication of note.**

## 2018-01-31 ENCOUNTER — Inpatient Hospital Stay: Payer: Medicare Other | Attending: Hematology

## 2018-01-31 VITALS — BP 110/60 | HR 85 | Temp 98.6°F | Resp 16

## 2018-01-31 DIAGNOSIS — I951 Orthostatic hypotension: Secondary | ICD-10-CM | POA: Insufficient documentation

## 2018-01-31 DIAGNOSIS — Z931 Gastrostomy status: Secondary | ICD-10-CM | POA: Insufficient documentation

## 2018-01-31 DIAGNOSIS — R6889 Other general symptoms and signs: Secondary | ICD-10-CM | POA: Diagnosis not present

## 2018-01-31 DIAGNOSIS — R000 Tachycardia, unspecified: Secondary | ICD-10-CM | POA: Insufficient documentation

## 2018-01-31 DIAGNOSIS — Z87891 Personal history of nicotine dependence: Secondary | ICD-10-CM | POA: Insufficient documentation

## 2018-01-31 DIAGNOSIS — K9423 Gastrostomy malfunction: Secondary | ICD-10-CM | POA: Insufficient documentation

## 2018-01-31 DIAGNOSIS — I700 Atherosclerosis of aorta: Secondary | ICD-10-CM | POA: Insufficient documentation

## 2018-01-31 DIAGNOSIS — L538 Other specified erythematous conditions: Secondary | ICD-10-CM | POA: Diagnosis not present

## 2018-01-31 DIAGNOSIS — Z7982 Long term (current) use of aspirin: Secondary | ICD-10-CM | POA: Diagnosis not present

## 2018-01-31 DIAGNOSIS — R509 Fever, unspecified: Secondary | ICD-10-CM | POA: Insufficient documentation

## 2018-01-31 DIAGNOSIS — R197 Diarrhea, unspecified: Secondary | ICD-10-CM | POA: Diagnosis not present

## 2018-01-31 DIAGNOSIS — R918 Other nonspecific abnormal finding of lung field: Secondary | ICD-10-CM | POA: Diagnosis not present

## 2018-01-31 DIAGNOSIS — C786 Secondary malignant neoplasm of retroperitoneum and peritoneum: Secondary | ICD-10-CM | POA: Diagnosis not present

## 2018-01-31 DIAGNOSIS — Z7189 Other specified counseling: Secondary | ICD-10-CM

## 2018-01-31 DIAGNOSIS — R05 Cough: Secondary | ICD-10-CM | POA: Insufficient documentation

## 2018-01-31 DIAGNOSIS — C163 Malignant neoplasm of pyloric antrum: Secondary | ICD-10-CM

## 2018-01-31 DIAGNOSIS — Z5111 Encounter for antineoplastic chemotherapy: Secondary | ICD-10-CM | POA: Insufficient documentation

## 2018-01-31 DIAGNOSIS — R5383 Other fatigue: Secondary | ICD-10-CM | POA: Insufficient documentation

## 2018-01-31 DIAGNOSIS — Z452 Encounter for adjustment and management of vascular access device: Secondary | ICD-10-CM | POA: Insufficient documentation

## 2018-01-31 DIAGNOSIS — R531 Weakness: Secondary | ICD-10-CM | POA: Insufficient documentation

## 2018-01-31 DIAGNOSIS — I10 Essential (primary) hypertension: Secondary | ICD-10-CM | POA: Insufficient documentation

## 2018-01-31 DIAGNOSIS — E860 Dehydration: Secondary | ICD-10-CM | POA: Diagnosis not present

## 2018-01-31 DIAGNOSIS — E440 Moderate protein-calorie malnutrition: Secondary | ICD-10-CM | POA: Insufficient documentation

## 2018-01-31 DIAGNOSIS — Z79899 Other long term (current) drug therapy: Secondary | ICD-10-CM | POA: Diagnosis not present

## 2018-01-31 DIAGNOSIS — I2510 Atherosclerotic heart disease of native coronary artery without angina pectoris: Secondary | ICD-10-CM | POA: Insufficient documentation

## 2018-01-31 DIAGNOSIS — D509 Iron deficiency anemia, unspecified: Secondary | ICD-10-CM | POA: Insufficient documentation

## 2018-01-31 MED ORDER — HEPARIN SOD (PORK) LOCK FLUSH 100 UNIT/ML IV SOLN
500.0000 [IU] | Freq: Once | INTRAVENOUS | Status: AC | PRN
  Administered 2018-01-31: 500 [IU]
  Filled 2018-01-31: qty 5

## 2018-01-31 MED ORDER — SODIUM CHLORIDE 0.9% FLUSH
10.0000 mL | INTRAVENOUS | Status: DC | PRN
  Administered 2018-01-31: 10 mL
  Filled 2018-01-31: qty 10

## 2018-02-04 ENCOUNTER — Encounter (HOSPITAL_COMMUNITY): Payer: Self-pay

## 2018-02-10 ENCOUNTER — Ambulatory Visit (HOSPITAL_COMMUNITY)
Admission: RE | Admit: 2018-02-10 | Discharge: 2018-02-10 | Disposition: A | Discharge status: Home / Self Care | Payer: Medicare Other | Source: Ambulatory Visit | Attending: Hematology | Admitting: Hematology

## 2018-02-10 ENCOUNTER — Telehealth: Payer: Self-pay | Admitting: *Deleted

## 2018-02-10 DIAGNOSIS — Z931 Gastrostomy status: Secondary | ICD-10-CM | POA: Insufficient documentation

## 2018-02-10 DIAGNOSIS — C163 Malignant neoplasm of pyloric antrum: Secondary | ICD-10-CM | POA: Diagnosis not present

## 2018-02-10 DIAGNOSIS — R918 Other nonspecific abnormal finding of lung field: Secondary | ICD-10-CM | POA: Insufficient documentation

## 2018-02-10 DIAGNOSIS — I2510 Atherosclerotic heart disease of native coronary artery without angina pectoris: Secondary | ICD-10-CM | POA: Diagnosis not present

## 2018-02-10 DIAGNOSIS — I700 Atherosclerosis of aorta: Secondary | ICD-10-CM | POA: Diagnosis not present

## 2018-02-10 DIAGNOSIS — C169 Malignant neoplasm of stomach, unspecified: Secondary | ICD-10-CM | POA: Diagnosis not present

## 2018-02-10 MED ORDER — IOPAMIDOL (ISOVUE-300) INJECTION 61%
100.0000 mL | Freq: Once | INTRAVENOUS | Status: AC | PRN
  Administered 2018-02-10: 100 mL via INTRAVENOUS

## 2018-02-10 MED ORDER — SODIUM CHLORIDE 0.9 % IJ SOLN
INTRAMUSCULAR | Status: AC
  Filled 2018-02-10: qty 50

## 2018-02-10 MED ORDER — IOPAMIDOL (ISOVUE-300) INJECTION 61%
INTRAVENOUS | Status: AC
  Filled 2018-02-10: qty 100

## 2018-02-10 NOTE — Telephone Encounter (Signed)
"  [facility] with a call report of today's CT C/A/P.   Please make sure provider is notified of results.  Dr. Romelle Starcher will be glad to talk with Dr. Burr Medico if needed.     New multifocal nodular airspace opacities are present within the right lower lobe, likely inflammatory.    Percutaneous jejunal feeding tube retention balloon is displaced peripherally into the anterior abdominal wall and is at risk for becoming dislodged."  Routing call report to provider for review.  Next scheduled F/U 02-12-2018 with A.P.P.

## 2018-02-10 NOTE — Telephone Encounter (Signed)
Lee Pratt, please review his recent CT scan findings. Do we need to do anything about his J-tube? He did have leakage around the J-tube last month, but stopped after we slightly advanced it and fix the loose issue.   [name] MD
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
## 2018-02-12 ENCOUNTER — Inpatient Hospital Stay: Payer: Medicare Other

## 2018-02-12 ENCOUNTER — Encounter: Payer: Self-pay | Admitting: Nurse Practitioner

## 2018-02-12 ENCOUNTER — Inpatient Hospital Stay (HOSPITAL_BASED_OUTPATIENT_CLINIC_OR_DEPARTMENT_OTHER): Payer: Medicare Other | Admitting: Nurse Practitioner

## 2018-02-12 VITALS — BP 111/58 | HR 82 | Temp 98.5°F | Resp 20 | Ht 69.0 in | Wt 124.6 lb

## 2018-02-12 DIAGNOSIS — Z7982 Long term (current) use of aspirin: Secondary | ICD-10-CM

## 2018-02-12 DIAGNOSIS — Z79899 Other long term (current) drug therapy: Secondary | ICD-10-CM

## 2018-02-12 DIAGNOSIS — I700 Atherosclerosis of aorta: Secondary | ICD-10-CM

## 2018-02-12 DIAGNOSIS — R05 Cough: Secondary | ICD-10-CM | POA: Diagnosis not present

## 2018-02-12 DIAGNOSIS — I2510 Atherosclerotic heart disease of native coronary artery without angina pectoris: Secondary | ICD-10-CM | POA: Diagnosis not present

## 2018-02-12 DIAGNOSIS — E440 Moderate protein-calorie malnutrition: Secondary | ICD-10-CM | POA: Diagnosis not present

## 2018-02-12 DIAGNOSIS — R918 Other nonspecific abnormal finding of lung field: Secondary | ICD-10-CM | POA: Diagnosis not present

## 2018-02-12 DIAGNOSIS — D509 Iron deficiency anemia, unspecified: Secondary | ICD-10-CM | POA: Diagnosis not present

## 2018-02-12 DIAGNOSIS — Z931 Gastrostomy status: Secondary | ICD-10-CM | POA: Diagnosis not present

## 2018-02-12 DIAGNOSIS — Z7189 Other specified counseling: Secondary | ICD-10-CM

## 2018-02-12 DIAGNOSIS — Z95828 Presence of other vascular implants and grafts: Secondary | ICD-10-CM

## 2018-02-12 DIAGNOSIS — C163 Malignant neoplasm of pyloric antrum: Secondary | ICD-10-CM

## 2018-02-12 DIAGNOSIS — I10 Essential (primary) hypertension: Secondary | ICD-10-CM | POA: Diagnosis not present

## 2018-02-12 DIAGNOSIS — C786 Secondary malignant neoplasm of retroperitoneum and peritoneum: Secondary | ICD-10-CM | POA: Diagnosis not present

## 2018-02-12 DIAGNOSIS — C169 Malignant neoplasm of stomach, unspecified: Secondary | ICD-10-CM

## 2018-02-12 DIAGNOSIS — K9423 Gastrostomy malfunction: Secondary | ICD-10-CM

## 2018-02-12 LAB — IRON AND TIBC
Iron: 42 ug/dL (ref 42–163)
Saturation Ratios: 12 % — ABNORMAL LOW (ref 42–163)
TIBC: 336 ug/dL (ref 202–409)
UIBC: 295 ug/dL

## 2018-02-12 LAB — CBC WITH DIFFERENTIAL (CANCER CENTER ONLY)
Basophils Absolute: 0 10*3/uL (ref 0.0–0.1)
Basophils Relative: 1 %
EOS ABS: 0.1 10*3/uL (ref 0.0–0.5)
Eosinophils Relative: 3 %
HCT: 32.1 % — ABNORMAL LOW (ref 38.4–49.9)
HEMOGLOBIN: 10.7 g/dL — AB (ref 13.0–17.1)
LYMPHS ABS: 1.3 10*3/uL (ref 0.9–3.3)
LYMPHS PCT: 29 %
MCH: 30.5 pg (ref 27.2–33.4)
MCHC: 33.4 g/dL (ref 32.0–36.0)
MCV: 91.4 fL (ref 79.3–98.0)
MONOS PCT: 14 %
Monocytes Absolute: 0.6 10*3/uL (ref 0.1–0.9)
NEUTROS PCT: 53 %
Neutro Abs: 2.4 10*3/uL (ref 1.5–6.5)
Platelet Count: 252 10*3/uL (ref 140–400)
RBC: 3.52 MIL/uL — ABNORMAL LOW (ref 4.20–5.82)
RDW: 19.1 % — ABNORMAL HIGH (ref 11.0–14.6)
WBC Count: 4.5 10*3/uL (ref 4.0–10.3)

## 2018-02-12 LAB — COMPREHENSIVE METABOLIC PANEL
ALK PHOS: 118 U/L (ref 40–150)
ALT: 26 U/L (ref 0–55)
AST: 24 U/L (ref 5–34)
Albumin: 2.7 g/dL — ABNORMAL LOW (ref 3.5–5.0)
Anion gap: 9 (ref 3–11)
BILIRUBIN TOTAL: 0.4 mg/dL (ref 0.2–1.2)
BUN: 25 mg/dL (ref 7–26)
CALCIUM: 8.7 mg/dL (ref 8.4–10.4)
CO2: 27 mmol/L (ref 22–29)
CREATININE: 0.78 mg/dL (ref 0.70–1.30)
Chloride: 101 mmol/L (ref 98–109)
GFR calc non Af Amer: >60 mL/min (ref >60)
GLUCOSE: 98 mg/dL (ref 70–140)
Potassium: 4.3 mmol/L (ref 3.5–5.1)
Sodium: 137 mmol/L (ref 136–145)
TOTAL PROTEIN: 6.8 g/dL (ref 6.4–8.3)

## 2018-02-12 LAB — FERRITIN: Ferritin: 356 ng/mL — ABNORMAL HIGH (ref 22–316)

## 2018-02-12 MED ORDER — SODIUM CHLORIDE 0.9% FLUSH
10.0000 mL | INTRAVENOUS | Status: DC | PRN
Start: 2018-02-12 — End: 2018-02-12
  Filled 2018-02-12: qty 10

## 2018-02-12 MED ORDER — DEXTROSE 5 % IV SOLN
Freq: Once | INTRAVENOUS | Status: AC
  Administered 2018-02-12: 14:00:00 via INTRAVENOUS

## 2018-02-12 MED ORDER — SODIUM CHLORIDE 0.9% FLUSH
10.0000 mL | INTRAVENOUS | Status: DC | PRN
  Administered 2018-02-12: 10 mL via INTRAVENOUS
  Filled 2018-02-12: qty 10

## 2018-02-12 MED ORDER — HEPARIN SOD (PORK) LOCK FLUSH 100 UNIT/ML IV SOLN
500.0000 [IU] | Freq: Once | INTRAVENOUS | Status: DC | PRN
  Filled 2018-02-12: qty 5

## 2018-02-12 MED ORDER — DEXAMETHASONE SODIUM PHOSPHATE 10 MG/ML IJ SOLN
INTRAMUSCULAR | Status: AC
  Filled 2018-02-12: qty 1

## 2018-02-12 MED ORDER — DEXTROSE 5 % IV SOLN
400.0000 mg/m2 | Freq: Once | INTRAVENOUS | Status: AC
  Administered 2018-02-12: 648 mg via INTRAVENOUS
  Filled 2018-02-12: qty 32.4

## 2018-02-12 MED ORDER — FLUOROURACIL CHEMO INJECTION 5 GM/100ML
2400.0000 mg/m2 | INTRAVENOUS | Status: DC
  Administered 2018-02-12: 3900 mg via INTRAVENOUS
  Filled 2018-02-12: qty 78

## 2018-02-12 MED ORDER — PALONOSETRON HCL INJECTION 0.25 MG/5ML
0.2500 mg | Freq: Once | INTRAVENOUS | Status: AC
  Administered 2018-02-12: 0.25 mg via INTRAVENOUS

## 2018-02-12 MED ORDER — DEXAMETHASONE SODIUM PHOSPHATE 10 MG/ML IJ SOLN
10.0000 mg | Freq: Once | INTRAMUSCULAR | Status: AC
  Administered 2018-02-12: 10 mg via INTRAVENOUS

## 2018-02-12 MED ORDER — DEXTROSE 5 % IV SOLN
70.0000 mg/m2 | Freq: Once | INTRAVENOUS | Status: AC
  Administered 2018-02-12: 115 mg via INTRAVENOUS
  Filled 2018-02-12: qty 20

## 2018-02-12 MED ORDER — PALONOSETRON HCL INJECTION 0.25 MG/5ML
INTRAVENOUS | Status: AC
  Filled 2018-02-12: qty 5

## 2018-02-12 NOTE — Patient Instructions (Addendum)
Return to the [facility] @ 3pm on Friday to have chemotherapy pump disconnected.  [facility] Discharge Instructions for Patients Receiving Chemotherapy  Today you received the following chemotherapy agents: Oxaliplatin, Leucovorin and Fluorouracil.  To help prevent nausea and vomiting after your treatment, we encourage you to take your nausea medication: Compazine.  Compazine. Take one every 6 hour as needed. If you develop nausea and vomiting that is not controlled by your nausea medication, call the clinic.   BELOW ARE SYMPTOMS THAT SHOULD BE REPORTED IMMEDIATELY:  *FEVER GREATER THAN 100.5 F  *CHILLS WITH OR WITHOUT FEVER  NAUSEA AND VOMITING THAT IS NOT CONTROLLED WITH YOUR NAUSEA MEDICATION  *UNUSUAL SHORTNESS OF BREATH  *UNUSUAL BRUISING OR BLEEDING  TENDERNESS IN MOUTH AND THROAT WITH OR WITHOUT PRESENCE OF ULCERS  *URINARY PROBLEMS  *BOWEL PROBLEMS  UNUSUAL RASH Items with * indicate a potential emergency and should be followed up as soon as possible.  Feel free to call the clinic should you have any questions or concerns. The clinic phone number is [phone].  Please show the [facility] at check-in to the Emergency Department and triage nurse.

## 2018-02-13 ENCOUNTER — Emergency Department (HOSPITAL_COMMUNITY)
Admission: EM | Admit: 2018-02-13 | Discharge: 2018-02-13 | Disposition: A | Discharge status: Home / Self Care | Payer: Medicare Other | Source: Home / Self Care | Attending: Emergency Medicine | Admitting: Emergency Medicine

## 2018-02-13 ENCOUNTER — Encounter (HOSPITAL_COMMUNITY): Payer: Self-pay

## 2018-02-13 ENCOUNTER — Other Ambulatory Visit: Payer: Self-pay

## 2018-02-13 DIAGNOSIS — I214 Non-ST elevation (NSTEMI) myocardial infarction: Secondary | ICD-10-CM | POA: Diagnosis not present

## 2018-02-13 DIAGNOSIS — J9601 Acute respiratory failure with hypoxia: Secondary | ICD-10-CM | POA: Diagnosis not present

## 2018-02-13 DIAGNOSIS — Z4659 Encounter for fitting and adjustment of other gastrointestinal appliance and device: Secondary | ICD-10-CM

## 2018-02-13 DIAGNOSIS — K9423 Gastrostomy malfunction: Secondary | ICD-10-CM | POA: Diagnosis not present

## 2018-02-13 DIAGNOSIS — Z7982 Long term (current) use of aspirin: Secondary | ICD-10-CM | POA: Insufficient documentation

## 2018-02-13 DIAGNOSIS — A419 Sepsis, unspecified organism: Secondary | ICD-10-CM | POA: Diagnosis not present

## 2018-02-13 DIAGNOSIS — Z87891 Personal history of nicotine dependence: Secondary | ICD-10-CM

## 2018-02-13 DIAGNOSIS — Z85020 Personal history of malignant carcinoid tumor of stomach: Secondary | ICD-10-CM | POA: Insufficient documentation

## 2018-02-13 DIAGNOSIS — J9600 Acute respiratory failure, unspecified whether with hypoxia or hypercapnia: Secondary | ICD-10-CM | POA: Diagnosis not present

## 2018-02-13 DIAGNOSIS — L539 Erythematous condition, unspecified: Secondary | ICD-10-CM | POA: Diagnosis not present

## 2018-02-13 DIAGNOSIS — R918 Other nonspecific abnormal finding of lung field: Secondary | ICD-10-CM | POA: Diagnosis not present

## 2018-02-13 DIAGNOSIS — I10 Essential (primary) hypertension: Secondary | ICD-10-CM | POA: Insufficient documentation

## 2018-02-13 DIAGNOSIS — J189 Pneumonia, unspecified organism: Secondary | ICD-10-CM | POA: Diagnosis not present

## 2018-02-13 DIAGNOSIS — I2109 ST elevation (STEMI) myocardial infarction involving other coronary artery of anterior wall: Secondary | ICD-10-CM | POA: Diagnosis not present

## 2018-02-13 DIAGNOSIS — E43 Unspecified severe protein-calorie malnutrition: Secondary | ICD-10-CM | POA: Diagnosis not present

## 2018-02-13 DIAGNOSIS — R21 Rash and other nonspecific skin eruption: Secondary | ICD-10-CM | POA: Diagnosis not present

## 2018-02-13 DIAGNOSIS — R6521 Severe sepsis with septic shock: Secondary | ICD-10-CM | POA: Diagnosis not present

## 2018-02-13 NOTE — Discharge Instructions (Signed)
Please call Dr. Marcello Moores to inform her about the J-tube falling out. Dressed the area appropriately.

## 2018-02-13 NOTE — ED Provider Notes (Signed)
[place] DEPT Provider Note   CSN: [id number] Arrival date & time: 02/13/18  1938     History   Chief Complaint Chief Complaint  Patient presents with  . J-tube fell out    HPI Lee Pratt is a 84 y.o. male.  HPI  84 year old comes in with chief complaint of J-tube malfunction. Patient has history of gastric adenocarcinoma, status post gastrojejunostomy and insertion of G-tube due to gastric outlet obstruction secondary to the mass. Surgery was done on November 13. Patient states that around 6:30 PM his tube accidentally fell out.  Patient informs me that he was supposed to see Dr. Marcello Moores, and there was plan to permanently remove the tube, as it was not draining anything anyways. Patient is able to drink water, and uses his G-tube for feeds.  Past Medical History:  Diagnosis Date  . Hyperglycemia 03/16/2013  . Hypertension   . Stomach cancer [facility])     Patient Active Problem List   Diagnosis Date Noted  . Moderate protein-calorie malnutrition ([place]) 12/31/2017  . Port-A-Cath in place 12/18/2017  . Goals of care, counseling/discussion 12/04/2017  . Gastric cancer ([place]) 11/19/2017  . Abnormal CT of the abdomen   . Partial gastric outlet obstruction 11/10/2017  . Abnormal CT scan, stomach   . Nausea without vomiting   . Abnormal loss of weight   . Gastric tumor   . Acute blood loss anemia 11/09/2017  . Hyperglycemia 03/16/2013  . Rash 10/21/2012  . Abnormal CXR 06/28/2012  . Chronic cough 05/19/2012  . HTN (hypertension) 12/21/2011  . Murmur 12/21/2011  . Hyperlipidemia 12/21/2011  . Arrhythmia 12/21/2011    Past Surgical History:  Procedure Laterality Date  . ESOPHAGOGASTRODUODENOSCOPY N/A 11/10/2017   Procedure: ESOPHAGOGASTRODUODENOSCOPY (EGD);  Surgeon: Jerene Bears, MD;  Location: [facility];  Service: Gastroenterology;  Laterality: N/A;  . GASTROJEJUNOSTOMY N/A 11/12/2017   Procedure: Diagnostic Laparoscopy with biopsy,  gastojejunostomy, insertion of G-tube and J-tube;  Surgeon: [name], MD;  Location: [facility];  Service: General;  Laterality: N/A;  . NO PAST SURGERIES  12/21/2011  . PORTACATH PLACEMENT Left 11/20/2017   Procedure: INSERTION PORT-A-CATH;  Surgeon: Ralene Ok, MD;  Location: [facility];  Service: General;  Laterality: Left;       Home Medications    Prior to Admission medications   Medication Sig Start Date End Date Taking? Authorizing Provider  aspirin EC 81 MG tablet Take 81 mg by mouth 3 (three) times a week.    Yes [provider]  docusate sodium (COLACE) 100 MG capsule Take 1 capsule (100 mg total) by mouth 2 (two) times daily as needed for mild constipation. Patient not taking: Reported on 02/13/2018 12/04/17   [name], NP  lidocaine-prilocaine (EMLA) cream Apply to affected area once Patient not taking: Reported on 02/13/2018 12/04/17   [name], MD  ondansetron (ZOFRAN ODT) 8 MG disintegrating tablet Take 1 tablet (8 mg total) by mouth 2 (two) times daily as needed for nausea or vomiting. Patient not taking: Reported on 02/13/2018 12/04/17   [name], NP  ondansetron (ZOFRAN) 8 MG tablet Take 1 tablet (8 mg total) by mouth 2 (two) times daily as needed for refractory nausea / vomiting. Start on day 3 after chemotherapy. Patient not taking: Reported on 02/13/2018 12/04/17   [name], MD  prochlorperazine (COMPAZINE) 10 MG tablet Take 1 tablet (10 mg total) by mouth every 6 (six) hours as needed (Nausea or vomiting). Patient not taking: Reported  on 02/13/2018 12/04/17   [name], MD    Family History Family History  Problem Relation Age of Onset  . Cancer Father        unknown type   . Cancer Brother        liver    Social History Social History   Tobacco Use  . Smoking status: Former Smoker    Packs/day: 2.50    Years: 5.00    Pack years: 12.50    Types: Cigarettes    Last attempt to quit: 2014    Years since quitting: 5.1  . Smokeless  tobacco: Never Used  Substance Use Topics  . Alcohol use: No  . Drug use: No     Allergies   Patient has no known allergies.   Review of Systems Review of Systems  Constitutional: Negative for activity change.  Gastrointestinal: Negative for abdominal pain.  Skin: Positive for rash.  Hematological: Does not bruise/bleed easily.     Physical Exam Updated Vital Signs BP 100/81 (BP Location: Left Arm)   Pulse 82   Temp 97.9 F (36.6 C) (Oral)   SpO2 99%   Physical Exam  Constitutional: He is oriented to person, place, and time. He appears well-developed.  HENT:  Head: Atraumatic.  Neck: Neck supple.  Cardiovascular: Normal rate.  Pulmonary/Chest: Effort normal.  Abdominal: Soft. He exhibits no distension.  Intact G-tube. Dislodged J-tube, with the abdominal wall showing erythema surrounding the J-tube insertion site.  Neurological: He is alert and oriented to person, place, and time.  Skin: Skin is warm.  Nursing note and vitals reviewed.    ED Treatments / Results  Labs (all labs ordered are listed, but only abnormal results are displayed) Labs Reviewed - No data to display  EKG  EKG Interpretation None       Radiology No results found.  Procedures Procedures (including critical care time)  Medications Ordered in ED Medications - No data to display   Initial Impression / Assessment and Plan / ED Course  I have reviewed the triage vital signs and the nursing notes.  Pertinent labs & imaging results that were available during my care of the patient were reviewed by me and considered in my medical decision making (see chart for details).    Patient comes to the ER after his J-tube fell out. Patient's G-tube is used for nutritional purposes, while J-tube appears to be there to decompress gastric outlet obstruction.  It seems from the history that patient's J-tube was supposed to come out anyways.  CT scan from end of January showed proper  decompression of the stomach. I spoke with Dr. Johney Maine, who is covering general surgery.  Dr. Johney Maine reviewed patient's clinic visits, and confirms that the plan was for Dr. Marcello Moores to remove the J-tube.  We will place dressing on the J tube site. Pt advised to call Surgery tomorrow to update them.  Final Clinical Impressions(s) / ED Diagnoses   Final diagnoses:  Encounter for care related to feeding tube    ED Discharge Orders    None       Varney Biles, MD 02/13/18 2152

## 2018-02-13 NOTE — ED Triage Notes (Signed)
Pt c/o his J-tube fell out today. Denies pain. A&Ox4. He has an appointment with his surgeon on 2/26.

## 2018-02-13 NOTE — ED Notes (Signed)
EDP at bedside  

## 2018-02-14 ENCOUNTER — Other Ambulatory Visit: Payer: Self-pay

## 2018-02-14 ENCOUNTER — Inpatient Hospital Stay: Payer: Medicare Other

## 2018-02-14 ENCOUNTER — Encounter: Payer: Self-pay | Admitting: Medical

## 2018-02-14 ENCOUNTER — Encounter (HOSPITAL_COMMUNITY): Payer: Self-pay | Admitting: *Deleted

## 2018-02-14 ENCOUNTER — Emergency Department (HOSPITAL_COMMUNITY): Payer: Medicare Other

## 2018-02-14 ENCOUNTER — Other Ambulatory Visit: Payer: Self-pay | Admitting: Medical

## 2018-02-14 ENCOUNTER — Inpatient Hospital Stay (HOSPITAL_COMMUNITY)
Admission: EM | Admit: 2018-02-14 | Discharge: 2018-02-21 | DRG: 871 | Disposition: A | Discharge status: Home / Self Care | Payer: Medicare Other | Attending: Internal Medicine | Admitting: Internal Medicine

## 2018-02-14 ENCOUNTER — Inpatient Hospital Stay (HOSPITAL_BASED_OUTPATIENT_CLINIC_OR_DEPARTMENT_OTHER): Payer: Medicare Other | Admitting: Medical

## 2018-02-14 ENCOUNTER — Encounter: Payer: Self-pay | Admitting: General Practice

## 2018-02-14 VITALS — BP 97/54 | HR 109 | Temp 98.2°F | Resp 18

## 2018-02-14 DIAGNOSIS — R6521 Severe sepsis with septic shock: Secondary | ICD-10-CM | POA: Diagnosis present

## 2018-02-14 DIAGNOSIS — C163 Malignant neoplasm of pyloric antrum: Secondary | ICD-10-CM

## 2018-02-14 DIAGNOSIS — C786 Secondary malignant neoplasm of retroperitoneum and peritoneum: Secondary | ICD-10-CM | POA: Diagnosis present

## 2018-02-14 DIAGNOSIS — K9422 Gastrostomy infection: Secondary | ICD-10-CM | POA: Diagnosis present

## 2018-02-14 DIAGNOSIS — A419 Sepsis, unspecified organism: Principal | ICD-10-CM | POA: Diagnosis present

## 2018-02-14 DIAGNOSIS — D509 Iron deficiency anemia, unspecified: Secondary | ICD-10-CM | POA: Diagnosis present

## 2018-02-14 DIAGNOSIS — I5020 Unspecified systolic (congestive) heart failure: Secondary | ICD-10-CM | POA: Diagnosis present

## 2018-02-14 DIAGNOSIS — R570 Cardiogenic shock: Secondary | ICD-10-CM | POA: Diagnosis present

## 2018-02-14 DIAGNOSIS — Z9221 Personal history of antineoplastic chemotherapy: Secondary | ICD-10-CM

## 2018-02-14 DIAGNOSIS — R64 Cachexia: Secondary | ICD-10-CM | POA: Diagnosis present

## 2018-02-14 DIAGNOSIS — I951 Orthostatic hypotension: Secondary | ICD-10-CM

## 2018-02-14 DIAGNOSIS — Z87891 Personal history of nicotine dependence: Secondary | ICD-10-CM

## 2018-02-14 DIAGNOSIS — J9601 Acute respiratory failure with hypoxia: Secondary | ICD-10-CM | POA: Diagnosis not present

## 2018-02-14 DIAGNOSIS — C169 Malignant neoplasm of stomach, unspecified: Secondary | ICD-10-CM | POA: Diagnosis present

## 2018-02-14 DIAGNOSIS — R6883 Chills (without fever): Secondary | ICD-10-CM

## 2018-02-14 DIAGNOSIS — E876 Hypokalemia: Secondary | ICD-10-CM | POA: Diagnosis present

## 2018-02-14 DIAGNOSIS — E785 Hyperlipidemia, unspecified: Secondary | ICD-10-CM | POA: Diagnosis present

## 2018-02-14 DIAGNOSIS — R5383 Other fatigue: Secondary | ICD-10-CM

## 2018-02-14 DIAGNOSIS — Z931 Gastrostomy status: Secondary | ICD-10-CM | POA: Diagnosis not present

## 2018-02-14 DIAGNOSIS — R918 Other nonspecific abnormal finding of lung field: Secondary | ICD-10-CM | POA: Diagnosis not present

## 2018-02-14 DIAGNOSIS — J9690 Respiratory failure, unspecified, unspecified whether with hypoxia or hypercapnia: Secondary | ICD-10-CM | POA: Diagnosis not present

## 2018-02-14 DIAGNOSIS — R000 Tachycardia, unspecified: Secondary | ICD-10-CM | POA: Diagnosis not present

## 2018-02-14 DIAGNOSIS — R509 Fever, unspecified: Secondary | ICD-10-CM | POA: Diagnosis not present

## 2018-02-14 DIAGNOSIS — R197 Diarrhea, unspecified: Secondary | ICD-10-CM

## 2018-02-14 DIAGNOSIS — D899 Disorder involving the immune mechanism, unspecified: Secondary | ICD-10-CM | POA: Diagnosis present

## 2018-02-14 DIAGNOSIS — R6889 Other general symptoms and signs: Secondary | ICD-10-CM | POA: Diagnosis not present

## 2018-02-14 DIAGNOSIS — Z7189 Other specified counseling: Secondary | ICD-10-CM

## 2018-02-14 DIAGNOSIS — L538 Other specified erythematous conditions: Secondary | ICD-10-CM | POA: Diagnosis not present

## 2018-02-14 DIAGNOSIS — B961 Klebsiella pneumoniae [K. pneumoniae] as the cause of diseases classified elsewhere: Secondary | ICD-10-CM | POA: Diagnosis present

## 2018-02-14 DIAGNOSIS — L03311 Cellulitis of abdominal wall: Secondary | ICD-10-CM | POA: Diagnosis not present

## 2018-02-14 DIAGNOSIS — K9413 Enterostomy malfunction: Secondary | ICD-10-CM | POA: Diagnosis not present

## 2018-02-14 DIAGNOSIS — I340 Nonrheumatic mitral (valve) insufficiency: Secondary | ICD-10-CM | POA: Diagnosis not present

## 2018-02-14 DIAGNOSIS — K9423 Gastrostomy malfunction: Secondary | ICD-10-CM | POA: Diagnosis not present

## 2018-02-14 DIAGNOSIS — I214 Non-ST elevation (NSTEMI) myocardial infarction: Secondary | ICD-10-CM | POA: Diagnosis not present

## 2018-02-14 DIAGNOSIS — R531 Weakness: Secondary | ICD-10-CM

## 2018-02-14 DIAGNOSIS — Y95 Nosocomial condition: Secondary | ICD-10-CM | POA: Diagnosis present

## 2018-02-14 DIAGNOSIS — J181 Lobar pneumonia, unspecified organism: Secondary | ICD-10-CM | POA: Diagnosis present

## 2018-02-14 DIAGNOSIS — I10 Essential (primary) hypertension: Secondary | ICD-10-CM

## 2018-02-14 DIAGNOSIS — Z66 Do not resuscitate: Secondary | ICD-10-CM | POA: Diagnosis present

## 2018-02-14 DIAGNOSIS — Z681 Body mass index (BMI) 19 or less, adult: Secondary | ICD-10-CM

## 2018-02-14 DIAGNOSIS — I219 Acute myocardial infarction, unspecified: Secondary | ICD-10-CM | POA: Diagnosis present

## 2018-02-14 DIAGNOSIS — I2510 Atherosclerotic heart disease of native coronary artery without angina pectoris: Secondary | ICD-10-CM

## 2018-02-14 DIAGNOSIS — Z7982 Long term (current) use of aspirin: Secondary | ICD-10-CM

## 2018-02-14 DIAGNOSIS — L0291 Cutaneous abscess, unspecified: Secondary | ICD-10-CM

## 2018-02-14 DIAGNOSIS — R05 Cough: Secondary | ICD-10-CM

## 2018-02-14 DIAGNOSIS — B370 Candidal stomatitis: Secondary | ICD-10-CM | POA: Diagnosis present

## 2018-02-14 DIAGNOSIS — E860 Dehydration: Secondary | ICD-10-CM

## 2018-02-14 DIAGNOSIS — I2109 ST elevation (STEMI) myocardial infarction involving other coronary artery of anterior wall: Secondary | ICD-10-CM | POA: Diagnosis not present

## 2018-02-14 DIAGNOSIS — J9600 Acute respiratory failure, unspecified whether with hypoxia or hypercapnia: Secondary | ICD-10-CM

## 2018-02-14 DIAGNOSIS — I700 Atherosclerosis of aorta: Secondary | ICD-10-CM

## 2018-02-14 DIAGNOSIS — E872 Acidosis: Secondary | ICD-10-CM | POA: Diagnosis not present

## 2018-02-14 DIAGNOSIS — E440 Moderate protein-calorie malnutrition: Secondary | ICD-10-CM

## 2018-02-14 DIAGNOSIS — I213 ST elevation (STEMI) myocardial infarction of unspecified site: Secondary | ICD-10-CM | POA: Diagnosis not present

## 2018-02-14 DIAGNOSIS — K9419 Other complications of enterostomy: Secondary | ICD-10-CM | POA: Diagnosis not present

## 2018-02-14 DIAGNOSIS — L8990 Pressure ulcer of unspecified site, unspecified stage: Secondary | ICD-10-CM

## 2018-02-14 DIAGNOSIS — E43 Unspecified severe protein-calorie malnutrition: Secondary | ICD-10-CM | POA: Diagnosis not present

## 2018-02-14 DIAGNOSIS — J189 Pneumonia, unspecified organism: Secondary | ICD-10-CM | POA: Diagnosis present

## 2018-02-14 DIAGNOSIS — I110 Hypertensive heart disease with heart failure: Secondary | ICD-10-CM | POA: Diagnosis present

## 2018-02-14 DIAGNOSIS — I471 Supraventricular tachycardia: Secondary | ICD-10-CM | POA: Diagnosis not present

## 2018-02-14 DIAGNOSIS — Z79899 Other long term (current) drug therapy: Secondary | ICD-10-CM

## 2018-02-14 LAB — CBC WITH DIFFERENTIAL/PLATELET
BASOS ABS: 0 10*3/uL (ref 0.0–0.1)
BASOS PCT: 0 %
Eosinophils Absolute: 0 10*3/uL (ref 0.0–0.7)
Eosinophils Relative: 0 %
HEMATOCRIT: 34.6 % — AB (ref 39.0–52.0)
HEMOGLOBIN: 11.5 g/dL — AB (ref 13.0–17.0)
Lymphocytes Relative: 25 %
Lymphs Abs: 1.5 10*3/uL (ref 0.7–4.0)
MCH: 30.8 pg (ref 26.0–34.0)
MCHC: 33.2 g/dL (ref 30.0–36.0)
MCV: 92.8 fL (ref 78.0–100.0)
MONOS PCT: 1 %
Monocytes Absolute: 0.1 10*3/uL (ref 0.1–1.0)
NEUTROS ABS: 4.6 10*3/uL (ref 1.7–7.7)
NEUTROS PCT: 74 %
Platelets: 395 10*3/uL (ref 150–400)
RBC: 3.73 MIL/uL — AB (ref 4.22–5.81)
RDW: 19.4 % — ABNORMAL HIGH (ref 11.5–15.5)
WBC: 6.1 10*3/uL (ref 4.0–10.5)

## 2018-02-14 LAB — C DIFFICILE QUICK SCREEN W PCR REFLEX
C DIFFICILE (CDIFF) INTERP: NOT DETECTED
C DIFFICLE (CDIFF) ANTIGEN: NEGATIVE
C Diff toxin: NEGATIVE

## 2018-02-14 LAB — URINALYSIS, ROUTINE W REFLEX MICROSCOPIC
BILIRUBIN URINE: NEGATIVE
Bacteria, UA: NONE SEEN
GLUCOSE, UA: 50 mg/dL — AB
Hgb urine dipstick: NEGATIVE
Ketones, ur: 5 mg/dL — AB
LEUKOCYTES UA: NEGATIVE
Nitrite: NEGATIVE
PH: 6 (ref 5.0–8.0)
Protein, ur: 30 mg/dL — AB
RBC / HPF: NONE SEEN RBC/hpf (ref 0–5)
SPECIFIC GRAVITY, URINE: 1.016 (ref 1.005–1.030)
SQUAMOUS EPITHELIAL / LPF: NONE SEEN

## 2018-02-14 LAB — PROTIME-INR
INR: 1.1
Prothrombin Time: 14.1 seconds (ref 11.4–15.2)

## 2018-02-14 LAB — COMPREHENSIVE METABOLIC PANEL
ALBUMIN: 3 g/dL — AB (ref 3.5–5.0)
ALT: 31 U/L (ref 17–63)
ANION GAP: 18 — AB (ref 5–15)
AST: 44 U/L — AB (ref 15–41)
Alkaline Phosphatase: 94 U/L (ref 38–126)
BUN: 37 mg/dL — AB (ref 6–20)
CHLORIDE: 105 mmol/L (ref 101–111)
CO2: 16 mmol/L — ABNORMAL LOW (ref 22–32)
Calcium: 8.6 mg/dL — ABNORMAL LOW (ref 8.9–10.3)
Creatinine, Ser: 1.04 mg/dL (ref 0.61–1.24)
GFR calc Af Amer: >60 mL/min (ref >60)
Glucose, Bld: 117 mg/dL — ABNORMAL HIGH (ref 65–99)
POTASSIUM: 4.8 mmol/L (ref 3.5–5.1)
Sodium: 139 mmol/L (ref 135–145)
Total Bilirubin: 0.6 mg/dL (ref 0.3–1.2)
Total Protein: 6.8 g/dL (ref 6.5–8.1)

## 2018-02-14 LAB — I-STAT CG4 LACTIC ACID, ED
LACTIC ACID, VENOUS: 3.74 mmol/L — AB (ref 0.5–1.9)
Lactic Acid, Venous: 12.06 mmol/L (ref 0.5–1.9)

## 2018-02-14 LAB — PROCALCITONIN: Procalcitonin: 5.29 ng/mL

## 2018-02-14 LAB — I-STAT TROPONIN, ED: Troponin i, poc: 0.04 ng/mL (ref 0.00–0.08)

## 2018-02-14 MED ORDER — FLUCONAZOLE IN SODIUM CHLORIDE 400-0.9 MG/200ML-% IV SOLN
400.0000 mg | Freq: Once | INTRAVENOUS | Status: AC
  Administered 2018-02-15: 400 mg via INTRAVENOUS
  Filled 2018-02-14: qty 200

## 2018-02-14 MED ORDER — SODIUM CHLORIDE 0.9% FLUSH
10.0000 mL | INTRAVENOUS | Status: DC | PRN
  Administered 2018-02-14: 10 mL
  Filled 2018-02-14: qty 10

## 2018-02-14 MED ORDER — SODIUM CHLORIDE 0.9 % IV BOLUS (SEPSIS)
1000.0000 mL | Freq: Once | INTRAVENOUS | Status: AC
  Administered 2018-02-14: 1000 mL via INTRAVENOUS

## 2018-02-14 MED ORDER — HEPARIN SOD (PORK) LOCK FLUSH 100 UNIT/ML IV SOLN
500.0000 [IU] | Freq: Once | INTRAVENOUS | Status: DC | PRN
  Filled 2018-02-14: qty 5

## 2018-02-14 MED ORDER — FLUCONAZOLE IN SODIUM CHLORIDE 400-0.9 MG/200ML-% IV SOLN
800.0000 mg | Freq: Once | INTRAVENOUS | Status: DC
  Filled 2018-02-14: qty 400

## 2018-02-14 MED ORDER — ONDANSETRON HCL 4 MG PO TABS: 4.0000 mg | ORAL_TABLET | Freq: Four times a day (QID) | ORAL | Status: DC | PRN

## 2018-02-14 MED ORDER — ZOLPIDEM TARTRATE 5 MG PO TABS
5.0000 mg | ORAL_TABLET | Freq: Every evening | ORAL | Status: DC | PRN
  Administered 2018-02-17: 5 mg via ORAL
  Filled 2018-02-14: qty 1

## 2018-02-14 MED ORDER — FLUCONAZOLE IN SODIUM CHLORIDE 400-0.9 MG/200ML-% IV SOLN: 400.0000 mg | INTRAVENOUS | Status: DC

## 2018-02-14 MED ORDER — ACETAMINOPHEN 650 MG RE SUPP
650.0000 mg | Freq: Once | RECTAL | Status: AC
  Administered 2018-02-14: 650 mg via RECTAL
  Filled 2018-02-14: qty 1

## 2018-02-14 MED ORDER — HEPARIN BOLUS VIA INFUSION
3000.0000 [IU] | Freq: Once | INTRAVENOUS | Status: AC
  Administered 2018-02-14: 3000 [IU] via INTRAVENOUS
  Filled 2018-02-14: qty 3000

## 2018-02-14 MED ORDER — ASPIRIN 81 MG PO CHEW
324.0000 mg | CHEWABLE_TABLET | Freq: Once | ORAL | Status: AC
  Administered 2018-02-14: 324 mg via ORAL
  Filled 2018-02-14: qty 4

## 2018-02-14 MED ORDER — HEPARIN (PORCINE) IN NACL 100-0.45 UNIT/ML-% IJ SOLN
800.0000 [IU]/h | INTRAMUSCULAR | Status: DC
  Administered 2018-02-14 – 2018-02-16 (×2): 700 [IU]/h via INTRAVENOUS
  Filled 2018-02-14 (×2): qty 250

## 2018-02-14 MED ORDER — SODIUM CHLORIDE 0.9 % IV SOLN
INTRAVENOUS | Status: DC
  Administered 2018-02-14 – 2018-02-16 (×4): via INTRAVENOUS

## 2018-02-14 MED ORDER — PIPERACILLIN-TAZOBACTAM 3.375 G IVPB 30 MIN
3.3750 g | Freq: Once | INTRAVENOUS | Status: AC
  Administered 2018-02-14: 3.375 g via INTRAVENOUS
  Filled 2018-02-14: qty 50

## 2018-02-14 MED ORDER — HYDROCODONE-ACETAMINOPHEN 5-325 MG PO TABS
1.0000 | ORAL_TABLET | ORAL | Status: DC | PRN
  Administered 2018-02-18: 2 via ORAL
  Filled 2018-02-14 (×2): qty 2

## 2018-02-14 MED ORDER — ACETAMINOPHEN 325 MG PO TABS
650.0000 mg | ORAL_TABLET | Freq: Four times a day (QID) | ORAL | Status: DC | PRN
  Filled 2018-02-14: qty 2

## 2018-02-14 MED ORDER — SODIUM CHLORIDE 0.9 % IV SOLN
Freq: Once | INTRAVENOUS | Status: AC
  Administered 2018-02-14: 16:00:00 via INTRAVENOUS

## 2018-02-14 MED ORDER — PIPERACILLIN-TAZOBACTAM 3.375 G IVPB
3.3750 g | Freq: Three times a day (TID) | INTRAVENOUS | Status: DC
  Administered 2018-02-15 – 2018-02-19 (×14): 3.375 g via INTRAVENOUS
  Filled 2018-02-14 (×15): qty 50

## 2018-02-14 MED ORDER — ASPIRIN EC 81 MG PO TBEC
81.0000 mg | DELAYED_RELEASE_TABLET | Freq: Every day | ORAL | Status: DC
  Administered 2018-02-15 – 2018-02-21 (×7): 81 mg via ORAL
  Filled 2018-02-14 (×8): qty 1

## 2018-02-14 MED ORDER — PROCHLORPERAZINE MALEATE 10 MG PO TABS
10.0000 mg | ORAL_TABLET | Freq: Four times a day (QID) | ORAL | Status: DC | PRN
  Filled 2018-02-14: qty 1

## 2018-02-14 MED ORDER — DOPAMINE-DEXTROSE 3.2-5 MG/ML-% IV SOLN
0.0000 ug/kg/min | INTRAVENOUS | Status: DC
  Administered 2018-02-14: 5 ug/kg/min via INTRAVENOUS
  Filled 2018-02-14: qty 250

## 2018-02-14 MED ORDER — ONDANSETRON HCL 4 MG/2ML IJ SOLN
4.0000 mg | Freq: Four times a day (QID) | INTRAMUSCULAR | Status: DC | PRN
  Administered 2018-02-15 – 2018-02-16 (×2): 4 mg via INTRAVENOUS
  Filled 2018-02-14 (×2): qty 2

## 2018-02-14 MED ORDER — VANCOMYCIN HCL IN DEXTROSE 1-5 GM/200ML-% IV SOLN
1000.0000 mg | Freq: Once | INTRAVENOUS | Status: AC
  Administered 2018-02-14: 1000 mg via INTRAVENOUS
  Filled 2018-02-14: qty 200

## 2018-02-14 MED ORDER — FLUCONAZOLE IN SODIUM CHLORIDE 200-0.9 MG/100ML-% IV SOLN
200.0000 mg | INTRAVENOUS | Status: DC
  Administered 2018-02-15: 200 mg via INTRAVENOUS
  Filled 2018-02-14: qty 100

## 2018-02-14 MED ORDER — ONDANSETRON HCL 4 MG PO TABS: 8.0000 mg | ORAL_TABLET | Freq: Two times a day (BID) | ORAL | Status: DC | PRN

## 2018-02-14 MED ORDER — VANCOMYCIN HCL IN DEXTROSE 750-5 MG/150ML-% IV SOLN
750.0000 mg | INTRAVENOUS | Status: DC
  Administered 2018-02-15 – 2018-02-18 (×4): 750 mg via INTRAVENOUS
  Filled 2018-02-14 (×4): qty 150

## 2018-02-14 MED ORDER — SODIUM CHLORIDE 0.9 % IV BOLUS (SEPSIS)
500.0000 mL | Freq: Once | INTRAVENOUS | Status: AC
  Administered 2018-02-14: 500 mL via INTRAVENOUS

## 2018-02-14 MED ORDER — SODIUM CHLORIDE 0.9 % IV BOLUS (SEPSIS)
250.0000 mL | Freq: Once | INTRAVENOUS | Status: AC
  Administered 2018-02-14: 250 mL via INTRAVENOUS

## 2018-02-14 MED ORDER — ACETAMINOPHEN 650 MG RE SUPP: 650.0000 mg | Freq: Four times a day (QID) | RECTAL | Status: DC | PRN

## 2018-02-14 MED ORDER — CLOPIDOGREL BISULFATE 75 MG PO TABS
75.0000 mg | ORAL_TABLET | Freq: Every day | ORAL | Status: DC
  Administered 2018-02-15 – 2018-02-21 (×7): 75 mg via ORAL
  Filled 2018-02-14 (×8): qty 1

## 2018-02-14 MED ORDER — CLOPIDOGREL BISULFATE 300 MG PO TABS
300.0000 mg | ORAL_TABLET | Freq: Once | ORAL | Status: AC
  Administered 2018-02-14: 300 mg via ORAL
  Filled 2018-02-14: qty 1

## 2018-02-14 NOTE — Progress Notes (Addendum)
CSW received a call from Roslyn at [facility] stating pt's wife is [place], speaks no English and in addition, per the CSW at [facility] the pt and pt's wife have refused to use a Optometrist.  Per [place] Director stated an attempt MUST BE MADE by ED staff AND DOCUMENTED that pt was offered and pt refused a video translator and that an attempt and offer to the family be made to speak with them via the video translator.  CSW will updated the EDP and RN.   6:41 PM CSW updated EDP and RN and EDP and RN voiced understanding.  Please reconsult if future social work needs arise.  CSW standing by and available.  Alphonse Guild. Oluwasemilore Bahl, LCSW, LCAS, CSI Clinical Social Worker Ph: [phone]     \

## 2018-02-14 NOTE — ED Provider Notes (Signed)
[place] DEPT Provider Note   CSN: [id number] Arrival date & time: 02/14/18  1653     History   Chief Complaint Chief Complaint  Patient presents with  . Fever  . Chills    HPI Lee Pratt is a 84 y.o. male presenting for evaluation of fever and hypotension.  Level 5 caveat due to language barrier and acute distress.  Patient speaks [place] and is in acute distress.  Patient was at the cancer center for treatments when they noted he was hypotensive and febrile at 100.2.  He was seen in the emergency room yesterday for his J-tube falling out.  Patient was shaking and weak.  Concerns about sepsis or other infection.   HPI  Past Medical History:  Diagnosis Date  . Hyperglycemia 03/16/2013  . Hypertension   . Stomach cancer [facility])     Patient Active Problem List   Diagnosis Date Noted  . Acute anterior wall MI ([place]) 02/14/2018  . Moderate protein-calorie malnutrition ([place]) 12/31/2017  . Port-A-Cath in place 12/18/2017  . Goals of care, counseling/discussion 12/04/2017  . Gastric cancer ([place]) 11/19/2017  . Abnormal CT of the abdomen   . Partial gastric outlet obstruction 11/10/2017  . Abnormal CT scan, stomach   . Nausea without vomiting   . Abnormal loss of weight   . Gastric tumor   . Acute blood loss anemia 11/09/2017  . Hyperglycemia 03/16/2013  . Rash 10/21/2012  . Abnormal CXR 06/28/2012  . Chronic cough 05/19/2012  . HTN (hypertension) 12/21/2011  . Murmur 12/21/2011  . Hyperlipidemia 12/21/2011  . Arrhythmia 12/21/2011    Past Surgical History:  Procedure Laterality Date  . ESOPHAGOGASTRODUODENOSCOPY N/A 11/10/2017   Procedure: ESOPHAGOGASTRODUODENOSCOPY (EGD);  Surgeon: Jerene Bears, MD;  Location: [facility];  Service: Gastroenterology;  Laterality: N/A;  . GASTROJEJUNOSTOMY N/A 11/12/2017   Procedure: Diagnostic Laparoscopy with biopsy, gastojejunostomy, insertion of G-tube and J-tube;  Surgeon: [name], MD;   Location[facility];  Service: General;  Laterality: N/A;  . NO PAST SURGERIES  12/21/2011  . PORTACATH PLACEMENT Left 11/20/2017   Procedure: INSERTION PORT-A-CATH;  Surgeon: Ralene Ok, MD;  Location: [facility];  Service: General;  Laterality: Left;       Home Medications    Prior to Admission medications   Medication Sig Start Date End Date Taking? Authorizing Provider  aspirin EC 81 MG tablet Take 81 mg by mouth 3 (three) times a week.    Yes [provider]  docusate sodium (COLACE) 100 MG capsule Take 1 capsule (100 mg total) by mouth 2 (two) times daily as needed for mild constipation. Patient not taking: Reported on 02/13/2018 12/04/17   [name], NP  lidocaine-prilocaine (EMLA) cream Apply to affected area once Patient not taking: Reported on 02/13/2018 12/04/17   [name], MD  ondansetron (ZOFRAN ODT) 8 MG disintegrating tablet Take 1 tablet (8 mg total) by mouth 2 (two) times daily as needed for nausea or vomiting. Patient not taking: Reported on 02/13/2018 12/04/17   [name], NP  ondansetron (ZOFRAN) 8 MG tablet Take 1 tablet (8 mg total) by mouth 2 (two) times daily as needed for refractory nausea / vomiting. Start on day 3 after chemotherapy. Patient not taking: Reported on 02/13/2018 12/04/17   [name], MD  prochlorperazine (COMPAZINE) 10 MG tablet Take 1 tablet (10 mg total) by mouth every 6 (six) hours as needed (Nausea or vomiting). Patient not taking: Reported on 02/13/2018 12/04/17   Burr Medico,  Krista Blue, MD    Family History Family History  Problem Relation Age of Onset  . Cancer Father        unknown type   . Cancer Brother        liver    Social History Social History   Tobacco Use  . Smoking status: Former Smoker    Packs/day: 2.50    Years: 5.00    Pack years: 12.50    Types: Cigarettes    Last attempt to quit: 2014    Years since quitting: 5.1  . Smokeless tobacco: Never Used  Substance Use Topics  . Alcohol use: No  . Drug use: No      Allergies   Patient has no known allergies.   Review of Systems Review of Systems  Unable to perform ROS: Acuity of condition  Constitutional: Positive for fever.  Allergic/Immunologic: Positive for immunocompromised state.  Neurological: Positive for weakness.     Physical Exam Updated Vital Signs BP (!) 91/55   Pulse (!) 108   Temp (!) 102.3 F (39.1 C) (Rectal)   Resp 18   Ht 5\' 9"  (1.753 m)   Wt 56.2 kg (124 lb)   SpO2 97%   BMI 18.31 kg/m      Physical Exam  Constitutional:  Cachectic, tachypneic, appears in distress.  HENT:  Mouth/Throat: Mucous membranes are dry.  MM dry  Eyes: Pupils are equal, round, and reactive to light.  Cardiovascular: Regular rhythm and intact distal pulses.  Tachycardic ~140  Pulmonary/Chest: Tachypnea noted. He is in respiratory distress. He has no wheezes. He has no rhonchi. He has no rales.  Pt tachypneic and using accessory muscles. Clear lung sounds  Abdominal: Soft. He exhibits no distension and no mass. There is no tenderness. There is no guarding.  J tube placed in LLQ without discharge or signs of infection.  Previous J tube site appears to be healing well.   Musculoskeletal:  rigors  Neurological:  Pt minimally responsive. Alert to name. ?language barrier  Skin: Skin is dry.  Nursing note and vitals reviewed.    ED Treatments / Results  Labs (all labs ordered are listed, but only abnormal results are displayed) Labs Reviewed  COMPREHENSIVE METABOLIC PANEL - Abnormal; Notable for the following components:      Result Value   CO2 16 (*)    Glucose, Bld 117 (*)    BUN 37 (*)    Calcium 8.6 (*)    Albumin 3.0 (*)    AST 44 (*)    Anion gap 18 (*)    All other components within normal limits  CBC WITH DIFFERENTIAL/PLATELET - Abnormal; Notable for the following components:   RBC 3.73 (*)    Hemoglobin 11.5 (*)    HCT 34.6 (*)    RDW 19.4 (*)    All other components within normal limits  URINALYSIS,  ROUTINE W REFLEX MICROSCOPIC - Abnormal; Notable for the following components:   Glucose, UA 50 (*)    Ketones, ur 5 (*)    Protein, ur 30 (*)    All other components within normal limits  I-STAT CG4 LACTIC ACID, ED - Abnormal; Notable for the following components:   Lactic Acid, Venous 12.06 (*)    All other components within normal limits  CULTURE, BLOOD (ROUTINE X 2)  CULTURE, BLOOD (ROUTINE X 2)  URINE CULTURE  C DIFFICILE QUICK SCREEN W PCR REFLEX  GASTROINTESTINAL PANEL BY PCR, STOOL (REPLACES STOOL CULTURE)  PROTIME-INR  CBC  HEPARIN LEVEL (UNFRACTIONATED)  LACTIC ACID, PLASMA  LACTIC ACID, PLASMA  PROCALCITONIN  PROCALCITONIN  I-STAT TROPONIN, ED  I-STAT CG4 LACTIC ACID, ED    EKG  EKG Interpretation None       Radiology Dg Chest Portable 1 View  Result Date: 02/14/2018 CLINICAL DATA:  Tachypnea. Weakness. Possible sepsis. Gastric cancer. EXAM: PORTABLE CHEST 1 VIEW COMPARISON:  Chest CT 02/10/2018 and radiograph 11/20/2017 FINDINGS: A left subclavian Port-A-Cath terminates over the upper to mid SVC. The cardiac silhouette is normal in size. Aortic atherosclerosis is noted. Pleural calcifications are again seen in the right hemithorax. Patchy airspace and fine interstitial opacities are present in both lung bases. No sizable pleural effusion or pneumothorax is identified. No acute osseous abnormality is seen. IMPRESSION: Mild bibasilar opacity suspicious for pneumonia. Electronically Signed   By: Logan Bores M.D.   On: 02/14/2018 18:53    Procedures .Critical Care Performed by: Franchot Heidelberg, PA-C Authorized by: Franchot Heidelberg, PA-C   Critical care provider statement:    Critical care time (minutes):  60   Critical care time was exclusive of:  Separately billable procedures and treating other patients and teaching time   Critical care was necessary to treat or prevent imminent or life-threatening deterioration of the following conditions:  Cardiac  failure, circulatory failure, sepsis and shock   Critical care was time spent personally by me on the following activities:  Development of treatment plan with patient or surrogate, discussions with consultants, evaluation of patient's response to treatment, examination of patient, obtaining history from patient or surrogate, review of old charts, re-evaluation of patient's condition, pulse oximetry, ordering and review of radiographic studies, ordering and review of laboratory studies and ordering and performing treatments and interventions   I assumed direction of critical care for this patient from another provider in my specialty: no   Comments:     Pt presented critically ill, with signs of respiratory distress. Sepsis and Nstemi identified.  Pt became critically hypotensive.  Extensive time gathering information, as medical interpreter had to be used, and end of life discussed had.    (including critical care time)  Medications Ordered in ED Medications  aspirin chewable tablet 324 mg (not administered)  aspirin EC tablet 81 mg (not administered)  clopidogrel (PLAVIX) tablet 300 mg (not administered)  clopidogrel (PLAVIX) tablet 75 mg (not administered)  heparin bolus via infusion 3,000 Units (not administered)  heparin ADULT infusion 100 units/mL (25000 units/250mL sodium chloride 0.45%) (not administered)  piperacillin-tazobactam (ZOSYN) IVPB 3.375 g (0 g Intravenous Stopped 02/14/18 1841)  vancomycin (VANCOCIN) IVPB 1000 mg/200 mL premix (1,000 mg Intravenous New Bag/Given 02/14/18 1841)  sodium chloride 0.9 % bolus 1,000 mL (0 mLs Intravenous Stopped 02/14/18 1846)    And  sodium chloride 0.9 % bolus 500 mL (0 mLs Intravenous Stopped 02/14/18 1757)    And  sodium chloride 0.9 % bolus 250 mL (0 mLs Intravenous Stopped 02/14/18 1741)  acetaminophen (TYLENOL) suppository 650 mg (650 mg Rectal Given 02/14/18 1734)     Initial Impression / Assessment and Plan / ED Course  I have reviewed  the triage vital signs and the nursing notes.  Pertinent labs & imaging results that were available during my care of the patient were reviewed by me and considered in my medical decision making (see chart for details).  Clinical Course as of Feb 15 2000  Fri Feb 14, 2018  1725 Initial exam shows patient who is tachypneic, working hard to breathe, and hypoxic at 34.  Will not respond to questions (unsure if this is due to language barrier or distress).  Febrile at 102.3.  Hypertensive.  Reiger's initially, these comment.  Will call a code sepsis and start fluids, antibiotics, and Tylenol.  [[place]]  1730 Patient with history of gastric cancer sent in from the cancer clinic for being acutely ill.  Patient appears toxic shaking with rigors tachycardic tachypneic.  He is a temp of 102.3.  Initial lactate greater than 12.  He has been activated his code sepsis and antibiotics have been ordered.  We are waiting a family member for any history as the patient primarily speaks [place] and communicating with [place].  His initial EKG is very concerning for sinus tachycardia with ST elevations in V2 V3 V4 V5 with some inferior depressions.  The cardiology STEMI attending is been paged.  [[place]]  6295 Discussed with Dr. Burt Knack STEMI attending.  He reviewed the EKG and is also concerned patient is having anterior STEMI.  Unfortunately with the likely sepsis and unstable as of the patient he does not feel like he would be a Candidate.  He will be by to evaluate the patient in person though.  [MB]  2841 On reassessment, patient is no longer tachypneic, heart rate improved to 120.  Patient appears in less distress.  [[place]]  3244 The patient's EKG is not crossing over and use and so I am dictating a report on it.  This is sinus tachycardia with a heart rate of 126 with ST elevations in V2 V3 V4 V5 with reciprocal depressions in 2 3 and F.  [MB]  1845 On reassessment, patient is more alert.  Video interpreter started, and conversation  with patient and wife.  Per patient, he denies pain including chest pain.  He is not stopping his cancer treatment, he was told that his labs have been improving and he is gaining weight.  Discussed end-of-life options, patient states he does not want CPR or intubation.  We will continue with aggressive antibiotics and fluids.  Dr. Burt Knack evaluated the patient.  [[place]]  1910 Discussed with intensivist, they will admit the patient.  [[place]]  1955 Intensivist evaluated the patient, and felt there is nothing more for them to do.  Recommended admission to hospitalist service.  Will consult with hospitalist for admission.  Discussed with hospitalist, patient to be admitted.  [[place]]    Clinical Course User Index [MB] Hayden Rasmussen, MD [[place]] Franchot Heidelberg, PA-C   2100: asked to evaluate the pt, as pt is hypotensive. BP is critically low at ~67/48. Pt is sitting up and talking, in no acute distress. Denies pain. Will start fluids and call hospitalist for further management.   Final Clinical Impressions(s) / ED Diagnoses   Final diagnoses:  Sepsis, due to unspecified organism ([place])  HCAP (healthcare-associated pneumonia)  NSTEMI (non-ST elevated myocardial infarction) [facility])    ED Discharge Orders    None       Franchot Heidelberg, PA-C 02/14/18 2001    Franchot Heidelberg, PA-C 02/15/18 0027    Hayden Rasmussen, MD 02/15/18 1308

## 2018-02-14 NOTE — ED Notes (Signed)
ED TO INPATIENT HANDOFF REPORT  Name/Age/Gender Lee Pratt 84 y.o. male  Code Status Code Status History    Date Active Date Inactive Code Status Order ID Comments User Context   11/09/2017 14:24 11/20/2017 23:48 Full Code [id number]  Donne Hazel, MD ED      Home/SNF/Other Home  Chief Complaint hypotension  Level of Care/Admitting Diagnosis ED Disposition    ED Disposition Condition Comment   Admit  Hospital Area: [place] [[id number]]  Level of Care: Stepdown [14]  Diagnosis: AMI (acute myocardial infarction) [facility]) [213086]  Admitting Physician: Merton Border Marshal.B[place]ner  Attending Physician: Laren Everts, [place]  Estimated length of stay: past midnight tomorrow  Certification:: I certify this patient will need inpatient services for at least 2 midnights  PT Class (Do Not Modify): Inpatient [101]  PT Acc Code (Do Not Modify): Private [1]       Medical History Past Medical History:  Diagnosis Date  . Hyperglycemia 03/16/2013  . Hypertension   . Stomach cancer ([place])     Allergies No Known Allergies  IV Location/Drains/Wounds Patient Lines/Drains/Airways Status   Active Line/Drains/Airways    Name:   Placement date:   Placement time:   Site:   Days:   Implanted Port 11/20/17   11/20/17    1100    --   86   Peripheral IV 02/10/18 Anterior;Left;Medial Antecubital   02/10/18    1334    Antecubital   4   Gastrostomy/Enterostomy Jejunostomy 18 Fr. LLQ   11/12/17    1215    LLQ   94   Gastrostomy/Enterostomy Gastrostomy 22 Fr. LUQ   11/12/17    1217    LUQ   94   Incision (Closed) 11/12/17 Abdomen Other (Comment)   11/12/17    1223     94   Incision (Closed) 11/20/17 Chest Left   11/20/17    1218     86          Labs/Imaging Results for orders placed or performed during the hospital encounter of 02/14/18 (from the past 48 hour(s))  Comprehensive metabolic panel     Status: Abnormal   Collection Time: 02/14/18  5:12 PM  Result Value Ref Range   Sodium  139 135 - 145 mmol/L   Potassium 4.8 3.5 - 5.1 mmol/L   Chloride 105 101 - 111 mmol/L   CO2 16 (L) 22 - 32 mmol/L   Glucose, Bld 117 (H) 65 - 99 mg/dL   BUN 37 (H) 6 - 20 mg/dL   Creatinine, Ser 1.04 0.61 - 1.24 mg/dL   Calcium 8.6 (L) 8.9 - 10.3 mg/dL   Total Protein 6.8 6.5 - 8.1 g/dL   Albumin 3.0 (L) 3.5 - 5.0 g/dL   AST 44 (H) 15 - 41 U/L   ALT 31 17 - 63 U/L   Alkaline Phosphatase 94 38 - 126 U/L   Total Bilirubin 0.6 0.3 - 1.2 mg/dL   GFR calc non Af Amer >60 >60 mL/min   GFR calc Af Amer >60 >60 mL/min    Comment: (NOTE) The eGFR has been calculated using the CKD EPI equation. This calculation has not been validated in all clinical situations. eGFR's persistently <60 mL/min signify possible Chronic Kidney Disease.    Anion gap 18 (H) 5 - 15    Comment: Performed at [facility], [place] [address], [place], [place] [address]  CBC with Differential     Status: Abnormal   Collection  Time: 02/14/18  5:12 PM  Result Value Ref Range   WBC 6.1 4.0 - 10.5 K/uL   RBC 3.73 (L) 4.22 - 5.81 MIL/uL   Hemoglobin 11.5 (L) 13.0 - 17.0 g/dL   HCT 34.6 (L) 39.0 - 52.0 %   MCV 92.8 78.0 - 100.0 fL   MCH 30.8 26.0 - 34.0 pg   MCHC 33.2 30.0 - 36.0 g/dL   RDW 19.4 (H) 11.5 - 15.5 %   Platelets 395 150 - 400 K/uL   Neutrophils Relative % 74 %   Neutro Abs 4.6 1.7 - 7.7 K/uL   Lymphocytes Relative 25 %   Lymphs Abs 1.5 0.7 - 4.0 K/uL   Monocytes Relative 1 %   Monocytes Absolute 0.1 0.1 - 1.0 K/uL   Eosinophils Relative 0 %   Eosinophils Absolute 0.0 0.0 - 0.7 K/uL   Basophils Relative 0 %   Basophils Absolute 0.0 0.0 - 0.1 K/uL    Comment: Performed at [facility], [place] [address], [place], [place] [address]  Protime-INR     Status: None   Collection Time: 02/14/18  5:12 PM  Result Value Ref Range   Prothrombin Time 14.1 11.4 - 15.2 seconds   INR 1.10     Comment: Performed at [facility], [place] [address] [place],  [place] [address]  Urinalysis, Routine w reflex microscopic     Status: Abnormal   Collection Time: 02/14/18  5:12 PM  Result Value Ref Range   Color, Urine YELLOW YELLOW   APPearance CLEAR CLEAR   Specific Gravity, Urine 1.016 1.005 - 1.030   pH 6.0 5.0 - 8.0   Glucose, UA 50 (A) NEGATIVE mg/dL   Hgb urine dipstick NEGATIVE NEGATIVE   Bilirubin Urine NEGATIVE NEGATIVE   Ketones, ur 5 (A) NEGATIVE mg/dL   Protein, ur 30 (A) NEGATIVE mg/dL   Nitrite NEGATIVE NEGATIVE   Leukocytes, UA NEGATIVE NEGATIVE   RBC / HPF NONE Pratt 0 - 5 RBC/hpf   WBC, UA 0-5 0 - 5 WBC/hpf   Bacteria, UA NONE Pratt NONE Pratt   Squamous Epithelial / LPF NONE Pratt NONE Pratt   Mucus PRESENT     Comment: Performed at [facility], [place] [address], [place], [place] [address]  I-Stat CG4 Lactic Acid, ED     Status: Abnormal   Collection Time: 02/14/18  5:23 PM  Result Value Ref Range   Lactic Acid, Venous 12.06 (HH) 0.5 - 1.9 mmol/L   Comment NOTIFIED PHYSICIAN   I-Stat Troponin, ED (not at [facility])     Status: None   Collection Time: 02/14/18  5:29 PM  Result Value Ref Range   Troponin i, poc 0.04 0.00 - 0.08 ng/mL   Comment 3            Comment: Due to the release kinetics of cTnI, a negative result within the first hours of the onset of symptoms does not rule out myocardial infarction with certainty. If myocardial infarction is still suspected, repeat the test at appropriate intervals.   C difficile quick scan w PCR reflex     Status: None   Collection Time: 02/14/18  6:40 PM  Result Value Ref Range   C Diff antigen NEGATIVE NEGATIVE   C Diff toxin NEGATIVE NEGATIVE   C Diff interpretation No C. difficile detected.     Comment: Performed at [facility], [place] [address], [place], [place] [address]  Procalcitonin - Baseline  Status: None   Collection Time: 02/14/18  8:18 PM  Result Value Ref Range   Procalcitonin 5.29 ng/mL    Comment:        Interpretation: PCT > 2  ng/mL: Systemic infection (sepsis) is likely, unless other causes are known. (NOTE)       Sepsis PCT Algorithm           Lower Respiratory Tract                                      Infection PCT Algorithm    ----------------------------     ----------------------------         PCT < 0.25 ng/mL                PCT < 0.10 ng/mL         Strongly encourage             Strongly discourage   discontinuation of antibiotics    initiation of antibiotics    ----------------------------     -----------------------------       PCT 0.25 - 0.50 ng/mL            PCT 0.10 - 0.25 ng/mL               OR       >80% decrease in PCT            Discourage initiation of                                            antibiotics      Encourage discontinuation           of antibiotics    ----------------------------     -----------------------------         PCT >= 0.50 ng/mL              PCT 0.26 - 0.50 ng/mL               AND       <80% decrease in PCT              Encourage initiation of                                             antibiotics       Encourage continuation           of antibiotics    ----------------------------     -----------------------------        PCT >= 0.50 ng/mL                  PCT > 0.50 ng/mL               AND         increase in PCT                  Strongly encourage                                      initiation of antibiotics    Strongly encourage escalation             of antibiotics                                     -----------------------------                                           PCT <= 0.25 ng/mL                                                 OR                                        > 80% decrease in PCT                                     Discontinue / Do not initiate                                             antibiotics Performed at [place] [address], [place], [place] [address]   I-Stat CG4 Lactic Acid, ED     Status: Abnormal    Collection Time: 02/14/18  8:30 PM  Result Value Ref Range   Lactic Acid, Venous 3.74 (HH) 0.5 - 1.9 mmol/L   Comment NOTIFIED PHYSICIAN    Dg Chest Portable 1 View  Result Date: 02/14/2018 CLINICAL DATA:  Tachypnea. Weakness. Possible sepsis. Gastric cancer. EXAM: PORTABLE CHEST 1 VIEW COMPARISON:  Chest CT 02/10/2018 and radiograph 11/20/2017 FINDINGS: A left subclavian Port-A-Cath terminates over the upper to mid SVC. The cardiac silhouette is normal in size. Aortic atherosclerosis is noted. Pleural calcifications are again Pratt in the right hemithorax. Patchy airspace and fine interstitial opacities are present in both lung bases. No sizable pleural effusion or pneumothorax is identified. No acute osseous abnormality is Pratt. IMPRESSION: Mild bibasilar opacity suspicious for pneumonia. Electronically Signed   By: Logan Bores M.D.   On: 02/14/2018 18:53    Pending Labs Unresulted Labs (From admission, onward)   Start     Ordered   02/15/18 0500  CBC  Daily,   R     02/14/18 1927   02/15/18 0500  Procalcitonin  Daily,   R     02/14/18 1929   02/15/18 0500  Creatinine, serum  Daily,   R     02/14/18 2116   02/15/18 0400  Heparin level (unfractionated)  Once-Timed,   R     02/14/18 1927   02/14/18 1803  Gastrointestinal Panel by PCR , Stool  (Gastrointestinal Panel by PCR, Stool)  Once,   R     02/14/18 1802   02/14/18 1708  Culture, blood (Routine x 2)  BLOOD CULTURE X 2,   STAT    Question:  Patient immune status  Answer:  Immunocompromised   02/14/18 1707   02/14/18 1708  Urine culture  STAT,   STAT    Question:  Patient immune status  Answer:  Immunocompromised   02/14/18 1707   Signed and Held  Urinalysis, Routine w reflex microscopic  Once,   R     Signed and Held   Signed and Held  Troponin I  Now then every 6 hours,   R     Signed and Held   Signed and Held  Basic metabolic panel  Tomorrow morning,   R     Signed and Held   Signed and Held  CBC  Tomorrow morning,   R      Signed and Held      Vitals/Pain Today's Vitals   02/14/18 2029 02/14/18 2030 02/14/18 2032 02/14/18 2100  BP: (!) 94/58 98/60  (!) 73/51  Pulse: (!) 107 (!) 107  94  Resp: 16 17  19  Temp:      TempSrc:      SpO2: 96% 97%  97%  Weight:      Height:      PainSc:   0-No pain     Isolation Precautions No active isolations  Medications Medications  aspirin EC tablet 81 mg (not administered)  clopidogrel (PLAVIX) tablet 75 mg (not administered)  heparin ADULT infusion 100 units/mL (25000 units/250mL sodium chloride 0.45%) (700 Units/hr Intravenous New Bag/Given 02/14/18 2014)  vancomycin (VANCOCIN) IVPB 750 mg/150 ml premix (not administered)  piperacillin-tazobactam (ZOSYN) IVPB 3.375 g (not administered)  piperacillin-tazobactam (ZOSYN) IVPB 3.375 g (0 g Intravenous Stopped 02/14/18 1841)  vancomycin (VANCOCIN) IVPB 1000 mg/200 mL premix (0 mg Intravenous Stopped 02/14/18 2002)  sodium chloride 0.9 % bolus 1,000 mL (0 mLs Intravenous Stopped 02/14/18 1846)    And  sodium chloride 0.9 % bolus 500 mL (0 mLs Intravenous Stopped 02/14/18 1757)    And  sodium chloride 0.9 % bolus 250 mL (0 mLs Intravenous Stopped 02/14/18 1741)  acetaminophen (TYLENOL) suppository 650 mg (650 mg Rectal Given 02/14/18 1734)  aspirin chewable tablet 324 mg (324 mg Oral Given 02/14/18 2014)  clopidogrel (PLAVIX) tablet 300 mg (300 mg Oral Given 02/14/18 2017)  heparin bolus via infusion 3,000 Units (3,000 Units Intravenous Bolus from Bag 02/14/18 2014)  sodium chloride 0.9 % bolus 1,000 mL (0 mLs Intravenous Stopped 02/14/18 2144)    Mobility non-ambulatory (due to heparin)  

## 2018-02-14 NOTE — Consult Note (Signed)
Cardiology Consultation:   Patient ID: Lee Pratt; [id number]; 1934-04-21   Admit date: 02/14/2018 Date of Consult: 02/14/2018  Primary Care Provider: [name], MD Primary Cardiologist: No primary care provider on file.   Patient Profile:   Lee Pratt is a 84 y.o. male with a hx of gastric cancer who is being seen today for the evaluation of STEMI at the request of Dr Melina Copa.  History of Present Illness:   Lee Pratt has gastric cancer with peritoneal metastasis treated with chemotherapy. He was seen today at the cancer center and developed rigors, fever, and marked weakness. He was sent to the [facility] where his initial blood lactate is 12 mmol/L. An EKG shows sinus tachycardia with marked anteroseptal ST elevation and reciprocal ST depression.  The patient's wife is present at the bedside and interviewed through an interpreter. He has no past history of cardiac problems. He denies chest pain.  He denies shortness of breath.  The patient's wife says that he has never had any heart problems before.  Past Medical History:  Diagnosis Date  . Hyperglycemia 03/16/2013  . Hypertension   . Stomach cancer [facility])     Past Surgical History:  Procedure Laterality Date  . ESOPHAGOGASTRODUODENOSCOPY N/A 11/10/2017   Procedure: ESOPHAGOGASTRODUODENOSCOPY (EGD);  Surgeon: Jerene Bears, MD;  Location: [facility];  Service: Gastroenterology;  Laterality: N/A;  . GASTROJEJUNOSTOMY N/A 11/12/2017   Procedure: Diagnostic Laparoscopy with biopsy, gastojejunostomy, insertion of G-tube and J-tube;  Surgeon: [name], MD;  Location: [facility];  Service: General;  Laterality: N/A;  . NO PAST SURGERIES  12/21/2011  . PORTACATH PLACEMENT Left 11/20/2017   Procedure: INSERTION PORT-A-CATH;  Surgeon: Ralene Ok, MD;  Location: [facility];  Service: General;  Laterality: Left;     Home Medications:  Prior to Admission medications   Medication Sig Start Date End Date Taking? Authorizing Provider    aspirin EC 81 MG tablet Take 81 mg by mouth 3 (three) times a week.     [provider]  docusate sodium (COLACE) 100 MG capsule Take 1 capsule (100 mg total) by mouth 2 (two) times daily as needed for mild constipation. Patient not taking: Reported on 02/13/2018 12/04/17   [name], NP  lidocaine-prilocaine (EMLA) cream Apply to affected area once Patient not taking: Reported on 02/13/2018 12/04/17   [name], MD  ondansetron (ZOFRAN ODT) 8 MG disintegrating tablet Take 1 tablet (8 mg total) by mouth 2 (two) times daily as needed for nausea or vomiting. Patient not taking: Reported on 02/13/2018 12/04/17   [name], NP  ondansetron (ZOFRAN) 8 MG tablet Take 1 tablet (8 mg total) by mouth 2 (two) times daily as needed for refractory nausea / vomiting. Start on day 3 after chemotherapy. Patient not taking: Reported on 02/13/2018 12/04/17   [name], MD  prochlorperazine (COMPAZINE) 10 MG tablet Take 1 tablet (10 mg total) by mouth every 6 (six) hours as needed (Nausea or vomiting). Patient not taking: Reported on 02/13/2018 12/04/17   [name], MD    Inpatient Medications: Scheduled Meds:  Continuous Infusions: . piperacillin-tazobactam 3.375 g (02/14/18 1733)  . sodium chloride 1,000 mL (02/14/18 1733)  . vancomycin     PRN Meds:   Allergies:   No Known Allergies  Social History:   Social History   Socioeconomic History  . Marital status: Married    Spouse name: Not on file  . Number of children: 2  . Years of education: Not  on file  . Highest education level: Not on file  Social Needs  . Financial resource strain: Not on file  . Food insecurity - worry: Not on file  . Food insecurity - inability: Not on file  . Transportation needs - medical: Not on file  . Transportation needs - non-medical: Not on file  Occupational History  . Occupation: retired     Comment: previously Engineer, structural  Tobacco Use  . Smoking status: Former Smoker    Packs/day: 2.50     Years: 5.00    Pack years: 12.50    Types: Cigarettes    Last attempt to quit: 2014    Years since quitting: 5.1  . Smokeless tobacco: Never Used  Substance and Sexual Activity  . Alcohol use: No  . Drug use: No  . Sexual activity: Not Currently  Other Topics Concern  . Not on file  Social History Narrative  . Not on file    Family History:   Family History  Problem Relation Age of Onset  . Cancer Father        unknown type   . Cancer Brother        liver     ROS:  Please see the history of present illness.  Positive for weakness, fatigue, nausea, vomiting, fever, chills All other ROS reviewed and negative.     Physical Exam/Data:   Vitals:   02/14/18 1702 02/14/18 1706 02/14/18 1731  BP: (!) 158/110  (!) 172/84  Pulse: (!) 140  (!) 120  Resp: (!) 22  (!) 24  Temp: (!) 102.3 F (39.1 C)    TempSrc: Rectal    SpO2: (!) 89%  97%  Weight:  124 lb (56.2 kg)   Height:  5\' 9"  (1.753 m)     Intake/Output Summary (Last 24 hours) at 02/14/2018 1800 Last data filed at 02/14/2018 1757 Gross per 24 hour  Intake 750 ml  Output -  Net 750 ml   Filed Weights   02/14/18 1706  Weight: 124 lb (56.2 kg)   Body mass index is 18.31 kg/m.  General:  Frail appearing male in no acute distress HEENT: normal Lymph: no adenopathy Neck: no JVD Endocrine:  No thryomegaly Vascular: No carotid bruits;  Cardiac:  Tachy no murmur, regular Lungs:  clear to auscultation bilaterally, no wheezing, rhonchi or rales  Abd: soft, nontender. There is a dressing over the left upper abdomen  Ext: no edema Musculoskeletal:  No deformities, BUE and BLE strength normal and equal.  Skin: warm and dry  Neuro:  CNs 2-12 intact, no focal abnormalities noted   EKG:  The EKG was personally reviewed and demonstrates:  Sinus tachycardia with anterior ST and T wave changes consistent with anterior STEMI  Telemetry:  Telemetry was personally reviewed and demonstrates:  Sinus tach  Relevant CV  Studies: none  Laboratory Data:  Chemistry Recent Labs  Lab 02/12/18 1101  NA 137  K 4.3  CL 101  CO2 27  GLUCOSE 98  BUN 25  CREATININE 0.78  CALCIUM 8.7  GFRNONAA >60  GFRAA >60  ANIONGAP 9    Recent Labs  Lab 02/12/18 1101  PROT 6.8  ALBUMIN 2.7*  AST 24  ALT 26  ALKPHOS 118  BILITOT 0.4   Hematology Recent Labs  Lab 02/12/18 1104 02/14/18 1712  WBC 4.5 6.1  RBC 3.52* 3.73*  HGB  --  11.5*  HCT 32.1* 34.6*  MCV 91.4 92.8  MCH 30.5 30.8  MCHC  33.4 33.2  RDW 19.1* 19.4*  PLT 252 395   Cardiac EnzymesNo results for input(s): TROPONINI in the last 168 hours.  Recent Labs  Lab 02/14/18 1729  TROPIPOC 0.04    BNPNo results for input(s): BNP, PROBNP in the last 168 hours.  DDimer No results for input(s): DDIMER in the last 168 hours.  Radiology/Studies:  No results found.  Assessment and Plan:   1. Anterior STEMI 2. Sepsis 3. Gastric cancer with omental metastases 4. Severe protein calorie malnutrition  The patient has severe comorbid medical conditions as outlined and appears to have active sepsis with a markedly elevated lactate level of 12. His EKG suggests anterior STEMI. He is not a candidate for invasive cardiac procedures such as cath/PCI in the setting of his cancer, malnutrition, and active sepsis. Recommend medical therapy with heparin, aspirin, and clopidogrel. With fever and sepsis I would not recommend a beta-blocker or ACE-I because of risk of profound hypotension/AKI/etc. Will order an echocardiogram.  It is unusual that the patient has such profound ST change with what appears to be no cardiac symptoms at this time.  Will follow with you.  For questions or updates, please contact [place] Please consult [email or web address] for contact info under Cardiology/STEMI.   Signed, Sherren Mocha, MD  02/14/2018 6:00 PM

## 2018-02-14 NOTE — ED Notes (Signed)
Bed: RESB Expected date:  Expected time:  Means of arrival:  Comments: [facility] pt possible sepsis

## 2018-02-14 NOTE — Progress Notes (Signed)
Pt came into today to have chemo pump D/C. Patient is very weak. Pt states he vomited 5 or 6 times yesterday. Took vitals standing, sitting and laying down. D/C Chemo pump and left accessed for symptoms management. Arrived Pt to exam room 25

## 2018-02-14 NOTE — Progress Notes (Signed)
[place] CSW Progress Note  Call received from [facility], patient is in [facility] and will be transferred to ED to await inpatient admission.  Wife needs to return home to pick up medications, has no money or transport.  Via [employer], Diane (#[id number] - [place] interpreter), arranged taxi voucher for wife to go home and return to [employer].  Also spoke w son, Kaid Seeberger [phone]) to advise of above arrangement.  Son plans to arrive in [place] tomorrow, is trying to change plane flight from [place] to arrive earlier.  CSW also left VM for son Cristie Hem [phone]), provided contact information for [facility] for questions/concerns from family.  CSW will alert [facility] CSW of possible need for support of wife and patient, as well as need for interpreting services.  Edwyna Shell, LCSW Clinical Social Worker Phone:  [phone]

## 2018-02-14 NOTE — H&P (Signed)
[facility]                                                                                    Patient Demographics  Joal Eakle, is a 84 y.o. male  CSN: [id number]  MRN: [id number]  DOB - November 24, 1934  Admit Date - 02/14/2018  Outpatient Primary MD for the patient is [name], MD   With History of -  Past Medical History:  Diagnosis Date  . Hyperglycemia 03/16/2013  . Hypertension   . Stomach cancer [facility])       Past Surgical History:  Procedure Laterality Date  . ESOPHAGOGASTRODUODENOSCOPY N/A 11/10/2017   Procedure: ESOPHAGOGASTRODUODENOSCOPY (EGD);  Surgeon: Jerene Bears, MD;  Location: [facility];  Service: Gastroenterology;  Laterality: N/A;  . GASTROJEJUNOSTOMY N/A 11/12/2017   Procedure: Diagnostic Laparoscopy with biopsy, gastojejunostomy, insertion of G-tube and J-tube;  Surgeon: [name], MD;  Location: [facility];  Service: General;  Laterality: N/A;  . NO PAST SURGERIES  12/21/2011  . PORTACATH PLACEMENT Left 11/20/2017   Procedure: INSERTION PORT-A-CATH;  Surgeon: Ralene Ok, MD;  Location: [facility];  Service: General;  Laterality: Left;    in for   Chief Complaint  Patient presents with  . Fever  . Chills     HPI  Garin Mata  is a 84 y.o. male, of [place] descent, with past medical history significant for gastric cancer on treatment , hypertension and hyperglycemia who was sent from the cancer center for evaluation of altered mental status, hypotension and tachycardia.  His PEG tube fell yesterday and had to be inserted and was in our emergency room.  The patient went to the [facility] today to have his Port-A-Cath removed when he started having the symptoms.  In the emergency room, the patient was noted to have ST elevations and had a temperature of 102.  Cardiology was consulted and they advised starting the patient on heparin drip.  The patient is DNR/DNI so no procedure was going to be done on him.  His blood pressure continued dropping  and he was started on dopamine drip in addition to IV fluids.  Most of the history was taken in the presence of an interpreter.    Review of Systems    In presence of the interpreter the patient denied chest pain or shortness of breath.  He reported he is feeling fine although his blood pressure was tanking in front of [place].  Social History Social History   Tobacco Use  . Smoking status: Former Smoker    Packs/day: 2.50    Years: 5.00    Pack years: 12.50    Types: Cigarettes    Last attempt to quit: 2014    Years since quitting: 5.1  . Smokeless tobacco: Never Used  Substance Use Topics  . Alcohol use: No     Family History Family History  Problem Relation Age of Onset  . Cancer Father        unknown type   . Cancer Brother        liver     Prior to Admission medications   Medication Sig Start Date End Date Taking? Authorizing Provider  aspirin EC 81 MG tablet Take 81 mg by mouth 3 (three) times a week.    Yes [provider]  docusate sodium (COLACE) 100 MG capsule Take 1 capsule (100 mg total) by mouth 2 (two) times daily as needed for mild constipation. Patient not taking: Reported on 02/13/2018 12/04/17   [name], NP  lidocaine-prilocaine (EMLA) cream Apply to affected area once Patient not taking: Reported on 02/13/2018 12/04/17   [name], MD  ondansetron (ZOFRAN ODT) 8 MG disintegrating tablet Take 1 tablet (8 mg total) by mouth 2 (two) times daily as needed for nausea or vomiting. Patient not taking: Reported on 02/13/2018 12/04/17   [name], NP  ondansetron (ZOFRAN) 8 MG tablet Take 1 tablet (8 mg total) by mouth 2 (two) times daily as needed for refractory nausea / vomiting. Start on day 3 after chemotherapy. Patient not taking: Reported on 02/13/2018 12/04/17   [name], MD  prochlorperazine (COMPAZINE) 10 MG tablet Take 1 tablet (10 mg total) by mouth every 6 (six) hours as needed (Nausea or vomiting). Patient not taking: Reported on  02/13/2018 12/04/17   [name], MD    No Known Allergies  Physical Exam  Vitals  Blood pressure (!) 73/51, pulse 94, temperature (!) 102.3 F (39.1 C), temperature source Rectal, resp. rate 19, height 5\' 9"  (1.753 m), weight 56.2 kg (124 lb), SpO2 97 %.   1. General well-developed male, looks tired  2. Normal affect and insight, Not Suicidal or Homicidal, Awake Alert, Oriented X 3.  3. No F.N deficits, grossly, patient moving all extremities.  4. Ears and Eyes appear Normal, Conjunctivae clear, PERRLA. Moist Oral Mucosa.  5. Supple Neck, No JVD, No cervical lymphadenopathy appriciated, No Carotid Bruits.  6. Symmetrical Chest wall movement, Good air movement bilaterally, CTAB.  7. RRR, No Gallops, Rubs or Murmurs, No Parasternal Heave.  8. Positive Bowel Sounds, Abdomen Soft, Non tender, PEG in place.  9.  No Cyanosis, Normal Skin Turgor, No Skin Rash or Bruise.  10. Good muscle tone,  joints appear normal , no effusions, Normal ROM.    Data Review  CBC Recent Labs  Lab 02/12/18 1104 02/14/18 1712  WBC 4.5 6.1  HGB  --  11.5*  HCT 32.1* 34.6*  PLT 252 395  MCV 91.4 92.8  MCH 30.5 30.8  MCHC 33.4 33.2  RDW 19.1* 19.4*  LYMPHSABS 1.3 1.5  MONOABS 0.6 0.1  EOSABS 0.1 0.0  BASOSABS 0.0 0.0   ------------------------------------------------------------------------------------------------------------------  Chemistries  Recent Labs  Lab 02/12/18 1101 02/14/18 1712  NA 137 139  K 4.3 4.8  CL 101 105  CO2 27 16*  GLUCOSE 98 117*  BUN 25 37*  CREATININE 0.78 1.04  CALCIUM 8.7 8.6*  AST 24 44*  ALT 26 31  ALKPHOS 118 94  BILITOT 0.4 0.6   ------------------------------------------------------------------------------------------------------------------ estimated creatinine clearance is 44.3 mL/min (by C-G formula based on SCr of 1.04  mg/dL). ------------------------------------------------------------------------------------------------------------------ No results for input(s): TSH, T4TOTAL, T3FREE, THYROIDAB in the last 72 hours.  Invalid input(s): FREET3   Coagulation profile Recent Labs  Lab 02/14/18 1712  INR 1.10   ------------------------------------------------------------------------------------------------------------------- No results for input(s): DDIMER in the last 72 hours. -------------------------------------------------------------------------------------------------------------------  Cardiac Enzymes No results for input(s): CKMB, TROPONINI, MYOGLOBIN in the last 168 hours.  Invalid input(s): CK ------------------------------------------------------------------------------------------------------------------ Invalid input(s): POCBNP   ---------------------------------------------------------------------------------------------------------------  Urinalysis    Component Value Date/Time   COLORURINE YELLOW 02/14/2018 [place] 02/14/2018 1712  LABSPEC 1.016 02/14/2018 1712   PHURINE 6.0 02/14/2018 1712   GLUCOSEU 50 (A) 02/14/2018 1712   HGBUR NEGATIVE 02/14/2018 1712   BILIRUBINUR NEGATIVE 02/14/2018 1712   KETONESUR 5 (A) 02/14/2018 1712   PROTEINUR 30 (A) 02/14/2018 1712   NITRITE NEGATIVE 02/14/2018 1712   LEUKOCYTESUR NEGATIVE 02/14/2018 1712    ----------------------------------------------------------------------------------------------------------------   Imaging results:   Ct Chest W Contrast  Result Date: 02/10/2018 CLINICAL DATA:  Gastric cancer diagnosed 3 months ago. Post primary chemotherapy and radiation therapy. EXAM: CT CHEST, ABDOMEN, AND PELVIS WITH CONTRAST TECHNIQUE: Multidetector CT imaging of the chest, abdomen and pelvis was performed following the standard protocol during bolus administration of intravenous contrast. CONTRAST:  121mL  ISOVUE-300 IOPAMIDOL (ISOVUE-300) INJECTION 61% COMPARISON:  Chest CT 11/19/2017.  Abdominopelvic CT 11/09/2017. FINDINGS: CT CHEST FINDINGS Cardiovascular: Diffuse atherosclerosis of the aorta, great vessels and coronary arteries. No acute vascular findings are demonstrated. There are aortic valvular calcifications. The heart size is normal. There is no pericardial effusion. Mediastinum/Nodes: There are no enlarged mediastinal, hilar or axillary lymph nodes. Scattered small mediastinal lymph nodes are stable. The thyroid gland, esophagus and trachea demonstrate no significant findings. Probable dependent secretions in the distal trachea. Lungs/Pleura: There is no significant pleural effusion. Pleural calcifications the right hemithorax are again noted. There is stable asymmetric right apical pleuroparenchymal scarring with calcifications. New multifocal nodular airspace opacities are present within the right lower lobe, likely inflammatory. These are most conspicuous on axial images 71, 91 and 122. Scattered small pulmonary nodules bilaterally are stable. Musculoskeletal/Chest wall: No chest wall mass or suspicious osseous findings. CT ABDOMEN AND PELVIS FINDINGS Hepatobiliary: The liver is normal in density without focal abnormality. No evidence of gallstones, gallbladder wall thickening or biliary dilatation. Pancreas: Unremarkable. No pancreatic ductal dilatation or surrounding inflammatory changes. Spleen: Normal in size without focal abnormality. Adrenals/Urinary Tract: The adrenal glands appear stable. Both kidneys appear stable with tiny low-density lesions bilaterally, likely cysts. No evidence of urinary tract calculus or hydronephrosis. The bladder appears normal. Stomach/Bowel: Interval gastrojejunostomy. The circumferential wall thickening in the distal stomach has mildly improved. The gastrojejunostomy is patent. There is a percutaneous G-tube which appears well positioned. In addition, there is a  percutaneous jejunal feeding tube, the retention balloon for which is located in the left anterior abdominal wall subcutaneous fat. The tip of this tube is intraluminal. No evidence of bowel obstruction or extravasation of contrast material. The appendix appears normal. Vascular/Lymphatic: There are no enlarged abdominal or pelvic lymph nodes. Extensive aortic and branch vessel atherosclerosis again noted without evidence of acute vascular occlusion. Stable mild dilatation of the mid abdominal aorta with a maximal AP diameter of 2.8 cm. No venous abnormalities are seen. Reproductive: Stable appearance of the prostate gland and seminal vesicles. Other: Postsurgical changes in the anterior abdominal wall. No ascites or peritoneal nodularity. Musculoskeletal: No acute or significant osseous findings. The sacroiliac joints are ankylosed. IMPRESSION: 1. New patchy pulmonary airspace opacities within the right lower lobe, likely infectious/inflammatory. 2. Interval gastrojejunostomy with resolution of gastric distention. The gastrojejunostomy is patent. Percutaneous jejunal feeding tube retention balloon is displaced peripherally into the anterior abdominal wall and is at risk for becoming dislodged. 3. No evidence of metastatic gastric cancer. Distal gastric wall thickening noted on the previous studies has improved. 4. Extensive atherosclerosis, including Aortic Atherosclerosis (ICD10-I70.0). 5. Underlying chronic lung disease with pleuroparenchymal scarring asymmetric to the right. 6. These results will be called to the ordering clinician or representative by the Radiologist Assistant, and  communication documented in the PACS or zVision Dashboard. Electronically Signed   By: Richardean Sale M.D.   On: 02/10/2018 15:14   Ct Abdomen Pelvis W Contrast  Result Date: 02/10/2018 CLINICAL DATA:  Gastric cancer diagnosed 3 months ago. Post primary chemotherapy and radiation therapy. EXAM: CT CHEST, ABDOMEN, AND PELVIS WITH  CONTRAST TECHNIQUE: Multidetector CT imaging of the chest, abdomen and pelvis was performed following the standard protocol during bolus administration of intravenous contrast. CONTRAST:  191mL ISOVUE-300 IOPAMIDOL (ISOVUE-300) INJECTION 61% COMPARISON:  Chest CT 11/19/2017.  Abdominopelvic CT 11/09/2017. FINDINGS: CT CHEST FINDINGS Cardiovascular: Diffuse atherosclerosis of the aorta, great vessels and coronary arteries. No acute vascular findings are demonstrated. There are aortic valvular calcifications. The heart size is normal. There is no pericardial effusion. Mediastinum/Nodes: There are no enlarged mediastinal, hilar or axillary lymph nodes. Scattered small mediastinal lymph nodes are stable. The thyroid gland, esophagus and trachea demonstrate no significant findings. Probable dependent secretions in the distal trachea. Lungs/Pleura: There is no significant pleural effusion. Pleural calcifications the right hemithorax are again noted. There is stable asymmetric right apical pleuroparenchymal scarring with calcifications. New multifocal nodular airspace opacities are present within the right lower lobe, likely inflammatory. These are most conspicuous on axial images 71, 91 and 122. Scattered small pulmonary nodules bilaterally are stable. Musculoskeletal/Chest wall: No chest wall mass or suspicious osseous findings. CT ABDOMEN AND PELVIS FINDINGS Hepatobiliary: The liver is normal in density without focal abnormality. No evidence of gallstones, gallbladder wall thickening or biliary dilatation. Pancreas: Unremarkable. No pancreatic ductal dilatation or surrounding inflammatory changes. Spleen: Normal in size without focal abnormality. Adrenals/Urinary Tract: The adrenal glands appear stable. Both kidneys appear stable with tiny low-density lesions bilaterally, likely cysts. No evidence of urinary tract calculus or hydronephrosis. The bladder appears normal. Stomach/Bowel: Interval gastrojejunostomy. The  circumferential wall thickening in the distal stomach has mildly improved. The gastrojejunostomy is patent. There is a percutaneous G-tube which appears well positioned. In addition, there is a percutaneous jejunal feeding tube, the retention balloon for which is located in the left anterior abdominal wall subcutaneous fat. The tip of this tube is intraluminal. No evidence of bowel obstruction or extravasation of contrast material. The appendix appears normal. Vascular/Lymphatic: There are no enlarged abdominal or pelvic lymph nodes. Extensive aortic and branch vessel atherosclerosis again noted without evidence of acute vascular occlusion. Stable mild dilatation of the mid abdominal aorta with a maximal AP diameter of 2.8 cm. No venous abnormalities are seen. Reproductive: Stable appearance of the prostate gland and seminal vesicles. Other: Postsurgical changes in the anterior abdominal wall. No ascites or peritoneal nodularity. Musculoskeletal: No acute or significant osseous findings. The sacroiliac joints are ankylosed. IMPRESSION: 1. New patchy pulmonary airspace opacities within the right lower lobe, likely infectious/inflammatory. 2. Interval gastrojejunostomy with resolution of gastric distention. The gastrojejunostomy is patent. Percutaneous jejunal feeding tube retention balloon is displaced peripherally into the anterior abdominal wall and is at risk for becoming dislodged. 3. No evidence of metastatic gastric cancer. Distal gastric wall thickening noted on the previous studies has improved. 4. Extensive atherosclerosis, including Aortic Atherosclerosis (ICD10-I70.0). 5. Underlying chronic lung disease with pleuroparenchymal scarring asymmetric to the right. 6. These results will be called to the ordering clinician or representative by the Radiologist Assistant, and communication documented in the PACS or zVision Dashboard. Electronically Signed   By: Richardean Sale M.D.   On: 02/10/2018 15:14   Dg  Chest Portable 1 View  Result Date: 02/14/2018 CLINICAL DATA:  Tachypnea. Weakness. Possible  sepsis. Gastric cancer. EXAM: PORTABLE CHEST 1 VIEW COMPARISON:  Chest CT 02/10/2018 and radiograph 11/20/2017 FINDINGS: A left subclavian Port-A-Cath terminates over the upper to mid SVC. The cardiac silhouette is normal in size. Aortic atherosclerosis is noted. Pleural calcifications are again seen in the right hemithorax. Patchy airspace and fine interstitial opacities are present in both lung bases. No sizable pleural effusion or pneumothorax is identified. No acute osseous abnormality is seen. IMPRESSION: Mild bibasilar opacity suspicious for pneumonia. Electronically Signed   By: Logan Bores M.D.   On: 02/14/2018 18:53    EKG shows acute anterior MI  Assessment & Plan  1.  Acute anterior MI, not a candidate for procedure.  Cardiology advised heparin drip 2.  Sepsis , unknown source at this time 3.  Bilateral pneumonia  Plan  Admit to ICU IV heparin drip IV vancomycin and Zosyn and Diflucan Check cultures Continue with pressors and IV fluids at this time   DVT Prophylaxis Heparin  AM Labs Ordered, also please review Full Orders    Code Status DO NOT RESUSCITATE  Disposition Plan: Undetermined  Time spent in minutes : 58 minutes  Condition critical  Grave prognosis, discussed with family was informed with his situation   @SIGNATURE @

## 2018-02-14 NOTE — Progress Notes (Addendum)
Pharmacy Antibiotic Note  Lee Pratt is a 84 y.o. male known to pharmacy from heparin dosing for STEMI. Sent from [facility] rigors, fever, and weakness. Pharmacy to dose Vancomycin, Zosyn, and Diflucan for sepsis.  Plan:  Vancomycin 1000 mg IV already given, then 750 mg IV q24 hr (est AUC 485 based on SCr 1)  Measure vancomycin AUC at steady state as indicated  Zosyn 3.375 g IV given once over 30 minutes, then every 8 hrs by 4-hr infusion  Diflucan 400 mg IV x 1 followed by 200 mg IV q24 hr (reduced 50% for renal impairment)  Daily SCr while on Vanc/Zosyn combination   Height: 5\' 9"  (175.3 cm) Weight: 124 lb (56.2 kg) IBW/kg (Calculated) : 70.7  Temp (24hrs), Avg:100.3 F (37.9 C), Min:98.2 F (36.8 C), Max:102.3 F (39.1 C)  Recent Labs  Lab 02/12/18 1101 02/12/18 1104 02/14/18 1712 02/14/18 1723 02/14/18 2030  WBC  --  4.5 6.1  --   --   CREATININE 0.78  --  1.04  --   --   LATICACIDVEN  --   --   --  12.06* 3.74*    Estimated Creatinine Clearance: 44.3 mL/min (by C-G formula based on SCr of 1.04 mg/dL).    No Known Allergies   Thank you for allowing pharmacy to be a part of this patient's care.  Reuel Boom, PharmD, BCPS [phone] 02/14/2018, 9:14 PM

## 2018-02-14 NOTE — Progress Notes (Signed)
Pt seen in [facility] per request of Corene Cornea, LPN who disconnected his 5FU pump. Corene Cornea stated appeared extremely weak and has some orthostatic changes.Shanda Howells was left accessed..  Noted increased weakness from pt and lethargy. Seen per Sandi Mealy, PA.  Pt started on some rehydration fluids. Noted after 1st 10 minutes of fluids that pt was shivering and VS taken. Noted to have increasing Temp. Sandi Mealy, PA notified. Pt progressed to rigors and tachycardia up to 100.2 and heart rate of 140.  Abd. Dressing removed and noted purulent, odorous drainage from previous J tube site. Area cultured and redressed with 4x4 gauze and ABD pads.  Pt being admitted to ED. Wife does not drive, but was able to communicate to [place] that she needed to go home to get her own medications. Through electronic interpreter as well as social worker York Spaniel, used to explain to wife that she would be given taxi voucher for trip home and back to the hospital.  Spoke with son, Gwyndolyn Saxon (lives out of town) and explained the situation to him. He voiced understanding and will be coming to [facility] tomorrow. His mother is aware of this.  Walked wife to lobby and instructed valet service of taxi coming to pick her up.  Wife voiced understanding of above situation

## 2018-02-14 NOTE — Progress Notes (Addendum)
[facility] Progress Note   Lee Pratt [id number] 06-02-1934 84 y.o.  Lee Pratt is managed by Dr. [name]  Actively treated with chemotherapy: yes  Current Therapy: FOLFOX  Last Treated: 02/12/2018 (cycle 5, day 1)  Assessment: Plan:    Dehydration - Plan: 0.9 %  sodium chloride infusion  Orthostatic hypotension  Rigors  Malignant neoplasm of pyloric antrum (HCC)  Fever and chills   Dehydration and orthostatic hypotension: Lee Pratt was started on IV fluids of normal saline for his dehydration and orthostasis.  Rigors and developing fever: Lee Pratt acutely developed rigors while he was being seen today.  His temperature was at 99.2 but then acutely raised to 100.2 with the development of rigors.  He had a J-tube that came out yesterday.  He was seen in the ER at that time.  The surgical incision where the J-tube had been placed is markedly erythematous today with purulent drainage.  This was cultured.  Lee Pratt was transported to the emergency room for acute management.  Lee Pratt son who is a respiratory therapist was contacted by our nurse.  He was made aware that Lee Pratt was being taken to the emergency room and would likely be admitted.  Social work was able to give Lee Pratt a taxi voucher so that she could go home to get her medicines and return immediately to be with Lee Pratt.  Malignant neoplasm of the pyloric antrum: Lee Pratt is status post cycle 5 of FOLFOX which was dosed on 02/12/2018.  Please see After Visit Summary for patient specific instructions.  Future Appointments  Date Time Provider [place]  02/26/2018 10:45 AM [facility] FLUSH NURSE 2 [facility] None  02/26/2018 11:15 AM [name], MD [facility] None  02/26/2018 12:45 PM [facility] H31 [facility] None  02/28/2018  3:00 PM [facility] FLUSH NURSE 2 [facility] None  03/12/2018 10:15 AM [facility] LAB 3 [facility] None  03/12/2018 10:45 AM [facility] FLUSH NURSE 2 [facility] None    03/12/2018 11:15 AM [facility] D13 [facility] None  03/12/2018 11:15 AM Neff, Valda Lamb, RD [facility] None    Orders Placed This Encounter  Procedures  . Aerobic Culture (superficial specimen)       Subjective:   Patient ID:  Lee Pratt is a 84 y.o. (DOB [birth year]) male.  Chief Complaint:  Chief Complaint  Patient presents with  . Fatigue    HPI Lee Pratt is an 84 year old male with a history of a malignant neoplasm of the pyloric antrum is status post cycle 5 of FOLFOX which was dosed on 02/12/2018.  He was seen in the emergency room yesterday when his J-tube became dislodged and came out.  He continues to have a G-tube.  He presented to the office today tohave a temperature of 99.2 but then developed acute rigors, tachycardia, and his 5-FU pump discontinued.  He was noted to be weak with mild orthostasis.  He was noted to have a temperature of 99.2.  He acutely developed rigors, tachycardia, and a temperature of 100.2.  He was examined and found to have purulent discharge and erythema at the site of his surgical incision where his J-tube had been placed.  Lee Pratt presented to the clinic today with his wife.  Lee Pratt has been using his G-tube for nourishment and has been using up to 7 cans of nourishment daily.  He is doing 80 mL of water flushes 4 times daily.  Medications: I have reviewed the patient's current medications.  Allergies: No  Known Allergies  Past Medical History:  Diagnosis Date  . Hyperglycemia 03/16/2013  . Hypertension   . Stomach cancer [facility])     Past Surgical History:  Procedure Laterality Date  . ESOPHAGOGASTRODUODENOSCOPY N/A 11/10/2017   Procedure: ESOPHAGOGASTRODUODENOSCOPY (EGD);  Surgeon: Jerene Bears, MD;  Location: [facility];  Service: Gastroenterology;  Laterality: N/A;  . GASTROJEJUNOSTOMY N/A 11/12/2017   Procedure: Diagnostic Laparoscopy with biopsy, gastojejunostomy, insertion of G-tube and J-tube;  Surgeon: [name], MD;  Location:  [facility];  Service: General;  Laterality: N/A;  . NO PAST SURGERIES  12/21/2011  . PORTACATH PLACEMENT Left 11/20/2017   Procedure: INSERTION PORT-A-CATH;  Surgeon: Ralene Ok, MD;  Location: [facility];  Service: General;  Laterality: Left;    Family History  Problem Relation Age of Onset  . Cancer Father        unknown type   . Cancer Brother        liver    Social History   Socioeconomic History  . Marital status: Married    Spouse name: Not on file  . Number of children: 2  . Years of education: Not on file  . Highest education level: Not on file  Social Needs  . Financial resource strain: Not on file  . Food insecurity - worry: Not on file  . Food insecurity - inability: Not on file  . Transportation needs - medical: Not on file  . Transportation needs - non-medical: Not on file  Occupational History  . Occupation: retired     Comment: previously Engineer, structural  Tobacco Use  . Smoking status: Former Smoker    Packs/day: 2.50    Years: 5.00    Pack years: 12.50    Types: Cigarettes    Last attempt to quit: 2014    Years since quitting: 5.1  . Smokeless tobacco: Never Used  Substance and Sexual Activity  . Alcohol use: No  . Drug use: No  . Sexual activity: Not Currently  Other Topics Concern  . Not on file  Social History Narrative  . Not on file    Past Medical History, Surgical history, Social history, and Family history were reviewed and updated as appropriate.   Please see review of systems for further details on the patient's review from today.   Review of Systems:  Review of Systems  Constitutional: Positive for chills, fatigue and fever. Negative for diaphoresis.       Rigors  HENT: Negative for trouble swallowing.   Respiratory: Negative for cough, shortness of breath and wheezing.   Cardiovascular: Positive for palpitations. Negative for chest pain.  Gastrointestinal: Positive for diarrhea. Negative for nausea and vomiting.  Neurological:  Positive for weakness.    Objective:   Physical Exam:  There were no vitals taken for this visit. ECOG: 1  Vital Signs:  BP: 155/79 pulse: 136 temp: 100.2 oxygen saturation: 95% on room air  Orthostatic Blood Pressure: Blood pressure:   lying 111/59, sitting 106/55, standing 97/54 Pulse:   lying 99, sitting 101, standing 109    Physical Exam  Constitutional:  Mr. Whiteley is an elderly male who is seated in a reclining chair and who has acutely developed rigors.  HENT:  Head: Normocephalic and atraumatic.  Mouth/Throat: Mucous membranes are dry.  Cardiovascular: S1 normal and S2 normal. Tachycardia present.  Pulmonary/Chest: Tachypnea noted. No respiratory distress. He has no wheezes. He has no rales.  Abdominal:    Neurological: Coordination (Mr. Weckerly is markedly weak and is  unable to stand on his.) abnormal.  Skin: He is not diaphoretic.    Lab Review:     Component Value Date/Time   NA 137 02/12/2018 1101   NA 139 01/01/2018 0804   K 4.3 02/12/2018 1101   K 4.2 01/01/2018 0804   CL 101 02/12/2018 1101   CO2 27 02/12/2018 1101   CO2 27 01/01/2018 0804   GLUCOSE 98 02/12/2018 1101   GLUCOSE 98 01/01/2018 0804   BUN 25 02/12/2018 1101   BUN 27.7 (H) 01/01/2018 0804   CREATININE 0.78 02/12/2018 1101   CREATININE 0.8 01/01/2018 0804   CALCIUM 8.7 02/12/2018 1101   CALCIUM 8.7 01/01/2018 0804   PROT 6.8 02/12/2018 1101   PROT 7.1 01/01/2018 0804   ALBUMIN 2.7 (L) 02/12/2018 1101   ALBUMIN 3.1 (L) 01/01/2018 0804   AST 24 02/12/2018 1101   AST 35 (H) 01/01/2018 0804   ALT 26 02/12/2018 1101   ALT 42 01/01/2018 0804   ALKPHOS 118 02/12/2018 1101   ALKPHOS 99 01/01/2018 0804   BILITOT 0.4 02/12/2018 1101   BILITOT 0.22 01/01/2018 0804   GFRNONAA >60 02/12/2018 1101   GFRAA >60 02/12/2018 1101       Component Value Date/Time   WBC 4.5 02/12/2018 1104   WBC 6.2 01/01/2018 0804   WBC 7.8 11/20/2017 0504   RBC 3.52 (L) 02/12/2018 1104   HGB 11.1 (L)  01/01/2018 0804   HCT 32.1 (L) 02/12/2018 1104   HCT 34.3 (L) 01/01/2018 0804   PLT 252 02/12/2018 1104   PLT 330 01/01/2018 0804   MCV 91.4 02/12/2018 1104   MCV 91.5 01/01/2018 0804   MCH 30.5 02/12/2018 1104   MCHC 33.4 02/12/2018 1104   RDW 19.1 (H) 02/12/2018 1104   RDW 18.7 (H) 01/01/2018 0804   LYMPHSABS 1.3 02/12/2018 1104   LYMPHSABS 1.7 01/01/2018 0804   MONOABS 0.6 02/12/2018 1104   MONOABS 0.5 01/01/2018 0804   EOSABS 0.1 02/12/2018 1104   EOSABS 0.2 01/01/2018 0804   BASOSABS 0.0 02/12/2018 1104   BASOSABS 0.0 01/01/2018 0804   -------------------------------  Imaging from last 24 hours (if applicable):  Radiology interpretation: Ct Chest W Contrast  Result Date: 02/10/2018 CLINICAL DATA:  Gastric cancer diagnosed 3 months ago. Post primary chemotherapy and radiation therapy. EXAM: CT CHEST, ABDOMEN, AND PELVIS WITH CONTRAST TECHNIQUE: Multidetector CT imaging of the chest, abdomen and pelvis was performed following the standard protocol during bolus administration of intravenous contrast. CONTRAST:  141mL ISOVUE-300 IOPAMIDOL (ISOVUE-300) INJECTION 61% COMPARISON:  Chest CT 11/19/2017.  Abdominopelvic CT 11/09/2017. FINDINGS: CT CHEST FINDINGS Cardiovascular: Diffuse atherosclerosis of the aorta, great vessels and coronary arteries. No acute vascular findings are demonstrated. There are aortic valvular calcifications. The heart size is normal. There is no pericardial effusion. Mediastinum/Nodes: There are no enlarged mediastinal, hilar or axillary lymph nodes. Scattered small mediastinal lymph nodes are stable. The thyroid gland, esophagus and trachea demonstrate no significant findings. Probable dependent secretions in the distal trachea. Lungs/Pleura: There is no significant pleural effusion. Pleural calcifications the right hemithorax are again noted. There is stable asymmetric right apical pleuroparenchymal scarring with calcifications. New multifocal nodular airspace  opacities are present within the right lower lobe, likely inflammatory. These are most conspicuous on axial images 71, 91 and 122. Scattered small pulmonary nodules bilaterally are stable. Musculoskeletal/Chest wall: No chest wall mass or suspicious osseous findings. CT ABDOMEN AND PELVIS FINDINGS Hepatobiliary: The liver is normal in density without focal abnormality. No evidence of gallstones, gallbladder wall  thickening or biliary dilatation. Pancreas: Unremarkable. No pancreatic ductal dilatation or surrounding inflammatory changes. Spleen: Normal in size without focal abnormality. Adrenals/Urinary Tract: The adrenal glands appear stable. Both kidneys appear stable with tiny low-density lesions bilaterally, likely cysts. No evidence of urinary tract calculus or hydronephrosis. The bladder appears normal. Stomach/Bowel: Interval gastrojejunostomy. The circumferential wall thickening in the distal stomach has mildly improved. The gastrojejunostomy is patent. There is a percutaneous G-tube which appears well positioned. In addition, there is a percutaneous jejunal feeding tube, the retention balloon for which is located in the left anterior abdominal wall subcutaneous fat. The tip of this tube is intraluminal. No evidence of bowel obstruction or extravasation of contrast material. The appendix appears normal. Vascular/Lymphatic: There are no enlarged abdominal or pelvic lymph nodes. Extensive aortic and branch vessel atherosclerosis again noted without evidence of acute vascular occlusion. Stable mild dilatation of the mid abdominal aorta with a maximal AP diameter of 2.8 cm. No venous abnormalities are seen. Reproductive: Stable appearance of the prostate gland and seminal vesicles. Other: Postsurgical changes in the anterior abdominal wall. No ascites or peritoneal nodularity. Musculoskeletal: No acute or significant osseous findings. The sacroiliac joints are ankylosed. IMPRESSION: 1. New patchy pulmonary  airspace opacities within the right lower lobe, likely infectious/inflammatory. 2. Interval gastrojejunostomy with resolution of gastric distention. The gastrojejunostomy is patent. Percutaneous jejunal feeding tube retention balloon is displaced peripherally into the anterior abdominal wall and is at risk for becoming dislodged. 3. No evidence of metastatic gastric cancer. Distal gastric wall thickening noted on the previous studies has improved. 4. Extensive atherosclerosis, including Aortic Atherosclerosis (ICD10-I70.0). 5. Underlying chronic lung disease with pleuroparenchymal scarring asymmetric to the right. 6. These results will be called to the ordering clinician or representative by the Radiologist Assistant, and communication documented in the PACS or zVision Dashboard. Electronically Signed   By: Richardean Sale M.D.   On: 02/10/2018 15:14   Ct Abdomen Pelvis W Contrast  Result Date: 02/10/2018 CLINICAL DATA:  Gastric cancer diagnosed 3 months ago. Post primary chemotherapy and radiation therapy. EXAM: CT CHEST, ABDOMEN, AND PELVIS WITH CONTRAST TECHNIQUE: Multidetector CT imaging of the chest, abdomen and pelvis was performed following the standard protocol during bolus administration of intravenous contrast. CONTRAST:  154mL ISOVUE-300 IOPAMIDOL (ISOVUE-300) INJECTION 61% COMPARISON:  Chest CT 11/19/2017.  Abdominopelvic CT 11/09/2017. FINDINGS: CT CHEST FINDINGS Cardiovascular: Diffuse atherosclerosis of the aorta, great vessels and coronary arteries. No acute vascular findings are demonstrated. There are aortic valvular calcifications. The heart size is normal. There is no pericardial effusion. Mediastinum/Nodes: There are no enlarged mediastinal, hilar or axillary lymph nodes. Scattered small mediastinal lymph nodes are stable. The thyroid gland, esophagus and trachea demonstrate no significant findings. Probable dependent secretions in the distal trachea. Lungs/Pleura: There is no significant  pleural effusion. Pleural calcifications the right hemithorax are again noted. There is stable asymmetric right apical pleuroparenchymal scarring with calcifications. New multifocal nodular airspace opacities are present within the right lower lobe, likely inflammatory. These are most conspicuous on axial images 71, 91 and 122. Scattered small pulmonary nodules bilaterally are stable. Musculoskeletal/Chest wall: No chest wall mass or suspicious osseous findings. CT ABDOMEN AND PELVIS FINDINGS Hepatobiliary: The liver is normal in density without focal abnormality. No evidence of gallstones, gallbladder wall thickening or biliary dilatation. Pancreas: Unremarkable. No pancreatic ductal dilatation or surrounding inflammatory changes. Spleen: Normal in size without focal abnormality. Adrenals/Urinary Tract: The adrenal glands appear stable. Both kidneys appear stable with tiny low-density lesions bilaterally, likely cysts. No  evidence of urinary tract calculus or hydronephrosis. The bladder appears normal. Stomach/Bowel: Interval gastrojejunostomy. The circumferential wall thickening in the distal stomach has mildly improved. The gastrojejunostomy is patent. There is a percutaneous G-tube which appears well positioned. In addition, there is a percutaneous jejunal feeding tube, the retention balloon for which is located in the left anterior abdominal wall subcutaneous fat. The tip of this tube is intraluminal. No evidence of bowel obstruction or extravasation of contrast material. The appendix appears normal. Vascular/Lymphatic: There are no enlarged abdominal or pelvic lymph nodes. Extensive aortic and branch vessel atherosclerosis again noted without evidence of acute vascular occlusion. Stable mild dilatation of the mid abdominal aorta with a maximal AP diameter of 2.8 cm. No venous abnormalities are seen. Reproductive: Stable appearance of the prostate gland and seminal vesicles. Other: Postsurgical changes in the  anterior abdominal wall. No ascites or peritoneal nodularity. Musculoskeletal: No acute or significant osseous findings. The sacroiliac joints are ankylosed. IMPRESSION: 1. New patchy pulmonary airspace opacities within the right lower lobe, likely infectious/inflammatory. 2. Interval gastrojejunostomy with resolution of gastric distention. The gastrojejunostomy is patent. Percutaneous jejunal feeding tube retention balloon is displaced peripherally into the anterior abdominal wall and is at risk for becoming dislodged. 3. No evidence of metastatic gastric cancer. Distal gastric wall thickening noted on the previous studies has improved. 4. Extensive atherosclerosis, including Aortic Atherosclerosis (ICD10-I70.0). 5. Underlying chronic lung disease with pleuroparenchymal scarring asymmetric to the right. 6. These results will be called to the ordering clinician or representative by the Radiologist Assistant, and communication documented in the PACS or zVision Dashboard. Electronically Signed   By: Richardean Sale M.D.   On: 02/10/2018 15:14        This patient was seen with Dr. Burr Medico with my treatment plan reviewed with her. She expressed agreement with my medical management of this patient.  Addendum  I have seen the patient, examined him. I agree with the assessment and and plan and have edited the notes.   Mr Rhett J-tube felt out yesterday and was evaluated at ED. He me today for 5-FU pump disconnection, and now has developed significant chills, rigors, and fever.  The J-tube site showed feeding material leakage.  I am concerned about infection, especially from his J-tube site.  He has not eaten or drunk much today, blood pressure borderline low, will give IV fluids, culture his J-tube site, and send him to emergency room for further evaluation. This likely warrant hospital admission.   [name]  02/14/2018

## 2018-02-14 NOTE — ED Triage Notes (Signed)
Per [facility]: Pt was seen here yesterday for his J-tube falling out. Pt went to [facility] center today to have his chemotherapy discontinued and was unable to stand. Pt is shaking in bed and weak to stand. [facility] center reports a possible code sepsis.

## 2018-02-14 NOTE — ED Notes (Signed)
Pt is [place] Speaking. RN and other staff have been using the Wall-E to communicate with pt and wife

## 2018-02-14 NOTE — Progress Notes (Signed)
[place] for IV heparin Indication: anterior STEMI  No Known Allergies  Patient Measurements: Height: 5\' 9"  (175.3 cm) Weight: 124 lb (56.2 kg) IBW/kg (Calculated) : 70.7 Heparin Dosing Weight: TBW  Vital Signs: Temp: 102.3 F (39.1 C) (02/15 1702) Temp Source: Rectal (02/15 1702) BP: 91/55 (02/15 1839) Pulse Rate: 108 (02/15 1839)  Labs: Recent Labs    02/12/18 1101 02/12/18 1104 02/14/18 1712  HGB  --   --  11.5*  HCT  --  32.1* 34.6*  PLT  --  252 395  LABPROT  --   --  14.1  INR  --   --  1.10  CREATININE 0.78  --  1.04    Estimated Creatinine Clearance: 44.3 mL/min (by C-G formula based on SCr of 1.04 mg/dL).   Medical History: Past Medical History:  Diagnosis Date  . Hyperglycemia 03/16/2013  . Hypertension   . Stomach cancer ([place])     Medications:   (Not in a hospital admission) Scheduled:  . aspirin  324 mg Oral Once  . [START ON 02/15/2018] aspirin EC  81 mg Oral Daily  . clopidogrel  300 mg Oral Once  . [START ON 02/15/2018] clopidogrel  75 mg Oral Daily  . heparin  3,000 Units Intravenous Once   Infusions:  . heparin    . vancomycin 1,000 mg (02/14/18 1841)    Assessment: 28 yoM with PMH metastatic gastric cancer on chemo sent from [facility] rigors, fever, and weakness. Found to be in sepsis and EKG also shows STEMI albeit without cardiac symptoms. Not a candidate for invasive procedures d/t multiple comorbidities. Cardiology is starting IV heparin per pharmacy along with ASA/Plavix.   Baseline INR wnl, aPTT not done  Prior anticoagulation: ASA 81 mg only  Significant events:  Today, 02/14/2018:  CBC: Hgb low, likely d/t chemo; Plt wnl  No bleeding or infusion issues per nursing  CrCl: 44 ml/min  Goal of Therapy: Heparin level 0.3-0.7 units/ml Monitor platelets by anticoagulation protocol: Yes  Plan:  Heparin 3000 units IV bolus x 1  Heparin 700 units/hr IV infusion  Check heparin level 8  hrs after start  Daily CBC, daily heparin level once stable  Monitor for signs of bleeding or thrombosis   Reuel Boom, PharmD, BCPS [phone] 02/14/2018, 7:30 PM

## 2018-02-14 NOTE — Progress Notes (Signed)
A consult was received from an ED physician for Vancomycin and Zosyn per pharmacy dosing.  The patient's profile has been reviewed for ht/wt/allergies/indication/available labs. A one time order has been placed for the above antibiotics.  Further antibiotics/pharmacy consults should be ordered by admitting physician if indicated.                       Reuel Boom, PharmD, BCPS [phone] 02/14/2018, 6:02 PM

## 2018-02-15 ENCOUNTER — Inpatient Hospital Stay (HOSPITAL_COMMUNITY): Payer: Medicare Other

## 2018-02-15 DIAGNOSIS — I214 Non-ST elevation (NSTEMI) myocardial infarction: Secondary | ICD-10-CM

## 2018-02-15 DIAGNOSIS — A419 Sepsis, unspecified organism: Principal | ICD-10-CM | POA: Insufficient documentation

## 2018-02-15 DIAGNOSIS — I340 Nonrheumatic mitral (valve) insufficiency: Secondary | ICD-10-CM

## 2018-02-15 DIAGNOSIS — L8990 Pressure ulcer of unspecified site, unspecified stage: Secondary | ICD-10-CM

## 2018-02-15 LAB — GASTROINTESTINAL PANEL BY PCR, STOOL (REPLACES STOOL CULTURE)

## 2018-02-15 LAB — CBC
HCT: 29.1 % — ABNORMAL LOW (ref 39.0–52.0)
Hemoglobin: 10 g/dL — ABNORMAL LOW (ref 13.0–17.0)
MCH: 31 pg (ref 26.0–34.0)
MCHC: 34.4 g/dL (ref 30.0–36.0)
MCV: 90.1 fL (ref 78.0–100.0)
PLATELETS: 260 10*3/uL (ref 150–400)
RBC: 3.23 MIL/uL — ABNORMAL LOW (ref 4.22–5.81)
RDW: 19.5 % — AB (ref 11.5–15.5)
WBC: 7.1 10*3/uL (ref 4.0–10.5)

## 2018-02-15 LAB — URINALYSIS, ROUTINE W REFLEX MICROSCOPIC
Bilirubin Urine: NEGATIVE
GLUCOSE, UA: NEGATIVE mg/dL
KETONES UR: NEGATIVE mg/dL
Leukocytes, UA: NEGATIVE
Nitrite: NEGATIVE
PH: 5 (ref 5.0–8.0)
Protein, ur: NEGATIVE mg/dL
SPECIFIC GRAVITY, URINE: 1.006 (ref 1.005–1.030)

## 2018-02-15 LAB — ECHOCARDIOGRAM COMPLETE
HEIGHTINCHES: 69 in
WEIGHTICAEL: 1984 [oz_av]

## 2018-02-15 LAB — TROPONIN I
TROPONIN I: 16.47 ng/mL — AB (ref <0.03)
Troponin I: 12.65 ng/mL (ref <0.03)
Troponin I: 21.08 ng/mL (ref <0.03)

## 2018-02-15 LAB — BASIC METABOLIC PANEL
ANION GAP: 10 (ref 5–15)
BUN: 30 mg/dL — ABNORMAL HIGH (ref 6–20)
CALCIUM: 7.4 mg/dL — AB (ref 8.9–10.3)
CO2: 17 mmol/L — ABNORMAL LOW (ref 22–32)
Chloride: 115 mmol/L — ABNORMAL HIGH (ref 101–111)
Creatinine, Ser: 0.96 mg/dL (ref 0.61–1.24)
GLUCOSE: 108 mg/dL — AB (ref 65–99)
POTASSIUM: 3.6 mmol/L (ref 3.5–5.1)
SODIUM: 142 mmol/L (ref 135–145)

## 2018-02-15 LAB — HEPARIN LEVEL (UNFRACTIONATED)
HEPARIN UNFRACTIONATED: 0.37 [IU]/mL (ref 0.30–0.70)
Heparin Unfractionated: 0.41 IU/mL (ref 0.30–0.70)

## 2018-02-15 LAB — LACTIC ACID, PLASMA: LACTIC ACID, VENOUS: 2.8 mmol/L — AB (ref 0.5–1.9)

## 2018-02-15 LAB — MRSA PCR SCREENING: MRSA BY PCR: NEGATIVE

## 2018-02-15 LAB — PROCALCITONIN: PROCALCITONIN: 17.6 ng/mL

## 2018-02-15 MED ORDER — SODIUM CHLORIDE 0.9 % IV BOLUS (SEPSIS)
500.0000 mL | Freq: Once | INTRAVENOUS | Status: AC
  Administered 2018-02-15: 500 mL via INTRAVENOUS

## 2018-02-15 MED ORDER — NOREPINEPHRINE BITARTRATE 1 MG/ML IV SOLN
0.0000 ug/min | INTRAVENOUS | Status: DC
  Administered 2018-02-15: 2 ug/min via INTRAVENOUS
  Filled 2018-02-15: qty 4

## 2018-02-15 MED ORDER — SODIUM CHLORIDE 0.9 % IV SOLN
250.0000 mL | INTRAVENOUS | Status: DC | PRN
  Administered 2018-02-15 – 2018-02-17 (×2): 250 mL via INTRAVENOUS

## 2018-02-15 NOTE — Significant Event (Signed)
..     Name: Lee Pratt MRN: [id number] DOB: [birth year]    ADMISSION DATE:  02/14/2018   SIGNIFICANT RESULTS:  ELEVATED TROPONIN: 12.65 As discussed with Overnight Cardiology Fellow. Pt did mount a troponin will continue to trend. As stated before per Cardiology he is not a candidate for catheterization. Will continue Heparin ggt as benefit outweighs risk of bleeding.   LACTATE 2.8 continues to trend down  BP improved after switching Vasopressor to Levophed ggt  PCT 5.29-> continue on antibiotics for CAP.  Kandice Hams MD [facility]  02/15/2018, 6:12 AM

## 2018-02-15 NOTE — Progress Notes (Addendum)
Name: Lee Pratt MRN: [id number] DOB: Apr 21, 1934    ADMISSION DATE:  02/14/2018 CONSULTATION DATE:  02/14/2018  REFERRING MD :  Dr. Melina Copa   CHIEF COMPLAINT: Shock, Lactic Acidosis   HISTORY OF PRESENT ILLNESS:   84 year old male with PMH of gastric adenocarcinoma with peritoneal metastasis stage IV s/p gastrojejunostomy due to gastric outlet syndrome, currently undergoing FOLFOX (has completed 5 cycles, last dosed on 02/12/18), HTN, Protein-calorie malnutrition  Presents to ED on 2/15 with weakness and increased lethargy. Seen in at outpatient oncology office for chemotherapy pump discontinuation. Temp 100.2 with rigors, surgical incision (where J-Tube was removed on 2/14) erythematous with purulent drainage. Arrived to ED Febrile, Hypoxic with tachypnea. Given 1.7L NS, Zosyn, and Vancomycin. LA  12.06. Initial EKG concerning for STEMI with inferior depressions. Cardiology consulted. Patient is not a candidate for intervention. PCCM asked to consult.   Upon assessment BP 100/58, HR 108, Patient is alert and oriented. Wife at bedside. Patient and wife states that even though they want to continue chemotherapy he wishes to NOT undergo CPR/Intubation or other aggressive medical therapies such as pressors/HD.   SIGNIFICANT EVENTS  2/15 > Presents to ED   STUDIES:  CXR 2/15 > A left subclavian Port-A-Cath terminates over the upper to mid SVC. The cardiac silhouette is normal in size. Aortic atherosclerosis is noted. Pleural calcifications are again seen in the right hemithorax. Patchy airspace and fine interstitial opacities are present in both lung bases. No sizable pleural effusion or pneumothorax is identified. No acute osseous abnormality is seen  PAST MEDICAL HISTORY :   has a past medical history of Hyperglycemia (03/16/2013), Hypertension, and Stomach cancer ([place]).  has a past surgical history that includes No past surgeries (12/21/2011); Esophagogastroduodenoscopy (N/A, 11/10/2017);  Gastrojejunostomy (N/A, 11/12/2017); and Portacath placement (Left, 11/20/2017). Prior to Admission medications   Medication Sig Start Date End Date Taking? Authorizing Provider  aspirin EC 81 MG tablet Take 81 mg by mouth 3 (three) times a week.    Yes [provider]  docusate sodium (COLACE) 100 MG capsule Take 1 capsule (100 mg total) by mouth 2 (two) times daily as needed for mild constipation. Patient not taking: Reported on 02/13/2018 12/04/17   [name], NP  lidocaine-prilocaine (EMLA) cream Apply to affected area once Patient not taking: Reported on 02/13/2018 12/04/17   [name], MD  ondansetron (ZOFRAN ODT) 8 MG disintegrating tablet Take 1 tablet (8 mg total) by mouth 2 (two) times daily as needed for nausea or vomiting. Patient not taking: Reported on 02/13/2018 12/04/17   [name], NP  ondansetron (ZOFRAN) 8 MG tablet Take 1 tablet (8 mg total) by mouth 2 (two) times daily as needed for refractory nausea / vomiting. Start on day 3 after chemotherapy. Patient not taking: Reported on 02/13/2018 12/04/17   [name], MD  prochlorperazine (COMPAZINE) 10 MG tablet Take 1 tablet (10 mg total) by mouth every 6 (six) hours as needed (Nausea or vomiting). Patient not taking: Reported on 02/13/2018 12/04/17   [name], MD   No Known Allergies  FAMILY HISTORY:  family history includes Cancer in his brother and father. SOCIAL HISTORY:  reports that he quit smoking about 5 years ago. His smoking use included cigarettes. He has a 12.50 pack-year smoking history. he has never used smokeless tobacco. He reports that he does not drink alcohol or use drugs.  REVIEW OF SYSTEMS:   All negative; except for those that are bolded, which indicate positives.  Constitutional: weight loss, weight gain, night sweats, fevers, chills, fatigue, weakness.  HEENT: headaches, sore throat, sneezing, nasal congestion, post nasal drip, difficulty swallowing, tooth/dental problems, visual  complaints, visual changes, ear aches. Neuro: difficulty with speech, weakness, numbness, ataxia. CV:  chest pain, orthopnea, PND, swelling in lower extremities, dizziness, palpitations, syncope.  Resp: cough, hemoptysis, dyspnea, wheezing. GI: heartburn, indigestion, abdominal pain, nausea, vomiting, diarrhea, constipation, change in bowel habits, loss of appetite, hematemesis, melena, hematochezia.  GU: dysuria, change in color of urine, urgency or frequency, flank pain, hematuria. MSK: joint pain or swelling, decreased range of motion. Psych: change in mood or affect, depression, anxiety, suicidal ideations, homicidal ideations. Skin: rash, itching, bruising.   SUBJECTIVE:  Patient examined in the ED. He still on low-dose levo Appears comfortable.  Denies dyspnea, pain  VITAL SIGNS: Temp:  [98.2 F (36.8 C)-102.3 F (39.1 C)] 98.6 F (37 C) (02/15 2205) Pulse Rate:  [82-140] 91 (02/16 0845) Resp:  [12-26] 12 (02/16 0845) BP: (60-172)/(46-110) 110/72 (02/16 0845) SpO2:  [89 %-100 %] 91 % (02/16 0845) Weight:  [124 lb (56.2 kg)] 124 lb (56.2 kg) (02/15 1706)  PHYSICAL EXAMINATION: Gen:      No acute distress, frail, catchetic HEENT:  EOMI, sclera anicteric Neck:     No masses; no thyromegaly Lungs:    Clear to auscultation bilaterally; normal respiratory effort CV:         Regular rate and rhythm; no murmurs Abd:      + bowel sounds; soft, non-tender; no palpable masses, no distension Ext:    No edema; adequate peripheral perfusion Skin:      Warm and dry; no rash Neuro: alert and oriented x 3 Psych: normal mood and affect   Recent Labs  Lab 02/12/18 1101 02/14/18 1712 02/15/18 0420  NA 137 139 142  K 4.3 4.8 3.6  CL 101 105 115*  CO2 27 16* 17*  BUN 25 37* 30*  CREATININE 0.78 1.04 0.96  GLUCOSE 98 117* 108*   Recent Labs  Lab 02/12/18 1104 02/14/18 1712 02/15/18 0420  HGB  --  11.5* 10.0*  HCT 32.1* 34.6* 29.1*  WBC 4.5 6.1 7.1  PLT 252 395 260   Dg  Chest Portable 1 View  Result Date: 02/14/2018 CLINICAL DATA:  Tachypnea. Weakness. Possible sepsis. Gastric cancer. EXAM: PORTABLE CHEST 1 VIEW COMPARISON:  Chest CT 02/10/2018 and radiograph 11/20/2017 FINDINGS: A left subclavian Port-A-Cath terminates over the upper to mid SVC. The cardiac silhouette is normal in size. Aortic atherosclerosis is noted. Pleural calcifications are again seen in the right hemithorax. Patchy airspace and fine interstitial opacities are present in both lung bases. No sizable pleural effusion or pneumothorax is identified. No acute osseous abnormality is seen. IMPRESSION: Mild bibasilar opacity suspicious for pneumonia. Electronically Signed   By: Logan Bores M.D.   On: 02/14/2018 18:53    ASSESSMENT / PLAN:  Acute Hypoxic Respiratory Failure in setting suspected CAP  CXR with mild bibasilar opacity  Plan  Wean down O2 Abx as above Follow RVP, sputum cultures  Lactic Acidosis, shock Combined septic, cardiogenic shock LA 12.06 > 3.74 > 2.8 Plan  Continue gentle hydration. NS at 50/hr Follow LA  Concern for CAP with bibasilar opacities  Immunocompromised  Previous J-Tube site with drainage  Febrile  Plan  On vanco, zosyn, diflucan Follow Pct  STEMI vs Pericarditis induced by FOLFOX  -no cardiac symptoms, denies chest pain  Plan  Not a candidate for invasive procedures. Cardiology is on board.  Continue heparin drip, ASA, plavix  Gastric Adenocarcinoma with peritoneal metastasis, recently completed cycle 5 of FOLFOX  Plan  Per Oncology Palliative care consulted.   The patient is critically ill with multiple organ system failure and requires high complexity decision making for assessment and support, frequent evaluation and titration of therapies, advanced monitoring, review of radiographic studies and interpretation of complex data.   Critical Care Time devoted to patient care services, exclusive of separately billable procedures, described in  this note is 35 minutes.   Marshell Garfinkel MD [facility] Pager [phone] If no answer or after 3pm call: [phone] 02/15/2018, 9:12 AM

## 2018-02-15 NOTE — Progress Notes (Signed)
CRITICAL VALUE ALERT  Critical Value:  Troponin 21.08  Date & Time Notied:  02/15/18 1228  Provider Notified: Dr. Vaughan Browner  Orders Received/Actions taken: no new orders

## 2018-02-15 NOTE — ED Notes (Signed)
Bed: WA14 Expected date:  Expected time:  Means of arrival:  Comments: 

## 2018-02-15 NOTE — ED Notes (Addendum)
Per lab pt has a Troponin of 12.65 Rn Mendel Ryder made aware.

## 2018-02-15 NOTE — ED Notes (Signed)
ED TO INPATIENT HANDOFF REPORT  Name/Age/Gender Lee Pratt 84 y.o. male  Code Status    Code Status Orders  (From admission, onward)        Start     Ordered   02/15/18 0035  Do not attempt resuscitation (DNR)  Continuous    Question Answer Comment  In the event of cardiac or respiratory ARREST Do not call a "code blue"   In the event of cardiac or respiratory ARREST Do not perform Intubation, CPR, defibrillation or ACLS   In the event of cardiac or respiratory ARREST Use medication by any route, position, wound care, and other measures to relive pain and suffering. May use oxygen, suction and manual treatment of airway obstruction as needed for comfort.      02/15/18 0035    Code Status History    Date Active Date Inactive Code Status Order ID Comments User Context   02/14/2018 21:51 02/15/2018 00:35 DNR [id number]  Merton Border, MD ED   11/09/2017 14:24 11/20/2017 23:48 Full Code [id number]  Donne Hazel, MD ED      Home/SNF/Other Home  Chief Complaint hypotension  Level of Care/Admitting Diagnosis ED Disposition    ED Disposition Condition [facility] [facility] Area: [facility] [[id number]]  Level of Care: ICU [6]  Diagnosis: Acute myocardial infarction [facility]) [[id number]]  Admitting Physician: Merton Border [[id number]]  Attending Physician: Kandice Hams [[id number]]  Estimated length of stay: past midnight tomorrow  Certification:: I certify this patient will need inpatient services for at least 2 midnights  PT Class (Do Not Modify): Inpatient [101]  PT Acc Code (Do Not Modify): Private [1]       Medical History Past Medical History:  Diagnosis Date  . Hyperglycemia 03/16/2013  . Hypertension   . Stomach cancer ([place])     Allergies No Known Allergies  IV Location/Drains/Wounds Patient Lines/Drains/Airways Status   Active Line/Drains/Airways    Name:   Placement date:   Placement time:   Site:   Days:   Implanted Port 11/20/17   11/20/17     1100    -   87   Peripheral IV 02/10/18 Anterior;Left;Medial Antecubital   02/10/18    1334    Antecubital   5   Peripheral IV 02/14/18 Left Wrist   02/14/18    1715    Wrist   1   Gastrostomy/Enterostomy Jejunostomy 18 Fr. LLQ   11/12/17    1215    LLQ   95   Gastrostomy/Enterostomy Gastrostomy 22 Fr. LUQ   11/12/17    1217    LUQ   95   Urethral Catheter Lanell Matar, RN Latex 16 Fr.   02/15/18    0054    Latex   less than 1   Incision (Closed) 11/12/17 Abdomen Other (Comment)   11/12/17    1223     95   Incision (Closed) 11/20/17 Chest Left   11/20/17    1218     87          Labs/Imaging Results for orders placed or performed during the hospital encounter of 02/14/18 (from the past 48 hour(s))  Comprehensive metabolic panel     Status: Abnormal   Collection Time: 02/14/18  5:12 PM  Result Value Ref Range   Sodium 139 135 - 145 mmol/L   Potassium 4.8 3.5 - 5.1 mmol/L   Chloride 105 101 - 111 mmol/L   CO2 16 (L) 22 - 32  mmol/L   Glucose, Bld 117 (H) 65 - 99 mg/dL   BUN 37 (H) 6 - 20 mg/dL   Creatinine, Ser 1.04 0.61 - 1.24 mg/dL   Calcium 8.6 (L) 8.9 - 10.3 mg/dL   Total Protein 6.8 6.5 - 8.1 g/dL   Albumin 3.0 (L) 3.5 - 5.0 g/dL   AST 44 (H) 15 - 41 U/L   ALT 31 17 - 63 U/L   Alkaline Phosphatase 94 38 - 126 U/L   Total Bilirubin 0.6 0.3 - 1.2 mg/dL   GFR calc non Af Amer >60 >60 mL/min   GFR calc Af Amer >60 >60 mL/min    Comment: (NOTE) The eGFR has been calculated using the CKD EPI equation. This calculation has not been validated in all clinical situations. eGFR's persistently <60 mL/min signify possible Chronic Kidney Disease.    Anion gap 18 (H) 5 - 15    Comment: Performed at [facility], [place] [address] [place], [place] [address]  CBC with Differential     Status: Abnormal   Collection Time: 02/14/18  5:12 PM  Result Value Ref Range   WBC 6.1 4.0 - 10.5 K/uL   RBC 3.73 (L) 4.22 - 5.81 MIL/uL   Hemoglobin 11.5 (L) 13.0 - 17.0 g/dL   HCT 34.6  (L) 39.0 - 52.0 %   MCV 92.8 78.0 - 100.0 fL   MCH 30.8 26.0 - 34.0 pg   MCHC 33.2 30.0 - 36.0 g/dL   RDW 19.4 (H) 11.5 - 15.5 %   Platelets 395 150 - 400 K/uL   Neutrophils Relative % 74 %   Neutro Abs 4.6 1.7 - 7.7 K/uL   Lymphocytes Relative 25 %   Lymphs Abs 1.5 0.7 - 4.0 K/uL   Monocytes Relative 1 %   Monocytes Absolute 0.1 0.1 - 1.0 K/uL   Eosinophils Relative 0 %   Eosinophils Absolute 0.0 0.0 - 0.7 K/uL   Basophils Relative 0 %   Basophils Absolute 0.0 0.0 - 0.1 K/uL    Comment: Performed at [facility], [place] [address], [place], [place] [address]  Protime-INR     Status: None   Collection Time: 02/14/18  5:12 PM  Result Value Ref Range   Prothrombin Time 14.1 11.4 - 15.2 seconds   INR 1.10     Comment: Performed at [facility], [place] [address], [place], [place] [address]  Urinalysis, Routine w reflex microscopic     Status: Abnormal   Collection Time: 02/14/18  5:12 PM  Result Value Ref Range   Color, Urine YELLOW YELLOW   APPearance CLEAR CLEAR   Specific Gravity, Urine 1.016 1.005 - 1.030   pH 6.0 5.0 - 8.0   Glucose, UA 50 (A) NEGATIVE mg/dL   Hgb urine dipstick NEGATIVE NEGATIVE   Bilirubin Urine NEGATIVE NEGATIVE   Ketones, ur 5 (A) NEGATIVE mg/dL   Protein, ur 30 (A) NEGATIVE mg/dL   Nitrite NEGATIVE NEGATIVE   Leukocytes, UA NEGATIVE NEGATIVE   RBC / HPF NONE Pratt 0 - 5 RBC/hpf   WBC, UA 0-5 0 - 5 WBC/hpf   Bacteria, UA NONE Pratt NONE Pratt   Squamous Epithelial / LPF NONE Pratt NONE Pratt   Mucus PRESENT     Comment: Performed at [facility], [place] [address] [place], [place] [address]  I-Stat CG4 Lactic Acid, ED     Status: Abnormal   Collection Time: 02/14/18  5:23 PM  Result Value Ref  Range   Lactic Acid, Venous 12.06 (HH) 0.5 - 1.9 mmol/L   Comment NOTIFIED PHYSICIAN   I-Stat Troponin, ED (not at [facility])     Status: None   Collection Time: 02/14/18  5:29 PM  Result Value Ref Range   Troponin i,  poc 0.04 0.00 - 0.08 ng/mL   Comment 3            Comment: Due to the release kinetics of cTnI, a negative result within the first hours of the onset of symptoms does not rule out myocardial infarction with certainty. If myocardial infarction is still suspected, repeat the test at appropriate intervals.   C difficile quick scan w PCR reflex     Status: None   Collection Time: 02/14/18  6:40 PM  Result Value Ref Range   C Diff antigen NEGATIVE NEGATIVE   C Diff toxin NEGATIVE NEGATIVE   C Diff interpretation No C. difficile detected.     Comment: Performed at [facility], [place] [address] [place], [place] [address]  Procalcitonin - Baseline     Status: None   Collection Time: 02/14/18  8:18 PM  Result Value Ref Range   Procalcitonin 5.29 ng/mL    Comment:        Interpretation: PCT > 2 ng/mL: Systemic infection (sepsis) is likely, unless other causes are known. (NOTE)       Sepsis PCT Algorithm           Lower Respiratory Tract                                      Infection PCT Algorithm    ----------------------------     ----------------------------         PCT < 0.25 ng/mL                PCT < 0.10 ng/mL         Strongly encourage             Strongly discourage   discontinuation of antibiotics    initiation of antibiotics    ----------------------------     -----------------------------       PCT 0.25 - 0.50 ng/mL            PCT 0.10 - 0.25 ng/mL               OR       >80% decrease in PCT            Discourage initiation of                                            antibiotics      Encourage discontinuation           of antibiotics    ----------------------------     -----------------------------         PCT >= 0.50 ng/mL              PCT 0.26 - 0.50 ng/mL               AND       <80% decrease in PCT              Encourage initiation of  antibiotics       Encourage continuation           of antibiotics     ----------------------------     -----------------------------        PCT >= 0.50 ng/mL                  PCT > 0.50 ng/mL               AND         increase in PCT                  Strongly encourage                                      initiation of antibiotics    Strongly encourage escalation           of antibiotics                                     -----------------------------                                           PCT <= 0.25 ng/mL                                                 OR                                        > 80% decrease in PCT                                     Discontinue / Do not initiate                                             antibiotics Performed at [place] [address], [place], [place] [address]   I-Stat CG4 Lactic Acid, ED     Status: Abnormal   Collection Time: 02/14/18  8:30 PM  Result Value Ref Range   Lactic Acid, Venous 3.74 (HH) 0.5 - 1.9 mmol/L   Comment NOTIFIED PHYSICIAN   Urinalysis, Routine w reflex microscopic     Status: Abnormal   Collection Time: 02/15/18 12:32 AM  Result Value Ref Range   Color, Urine STRAW (A) YELLOW   APPearance CLEAR CLEAR   Specific Gravity, Urine 1.006 1.005 - 1.030   pH 5.0 5.0 - 8.0   Glucose, UA NEGATIVE NEGATIVE mg/dL   Hgb urine dipstick MODERATE (A) NEGATIVE   Bilirubin Urine NEGATIVE NEGATIVE   Ketones, ur NEGATIVE NEGATIVE mg/dL   Protein, ur NEGATIVE NEGATIVE mg/dL   Nitrite NEGATIVE NEGATIVE   Leukocytes, UA NEGATIVE NEGATIVE   RBC / HPF 6-30 0 - 5 RBC/hpf   WBC, UA 0-5 0 - 5  WBC/hpf   Bacteria, UA RARE (A) NONE Pratt   Squamous Epithelial / LPF 0-5 (A) NONE Pratt   Mucus PRESENT     Comment: Performed at [facility], [place] [address] [place], [place] [address]  Troponin I     Status: Abnormal   Collection Time: 02/15/18 12:32 AM  Result Value Ref Range   Troponin I 12.65 (HH) <0.03 ng/mL    Comment: CRITICAL RESULT CALLED TO, READ BACK BY  AND VERIFIED WITH: L HERB,RN _0  02/15/18 MKELLY Performed at [facility], [place] [address] [place], [place] [address]   Lactic acid, plasma     Status: Abnormal   Collection Time: 02/15/18 12:32 AM  Result Value Ref Range   Lactic Acid, Venous 2.8 (HH) 0.5 - 1.9 mmol/L    Comment: CRITICAL RESULT CALLED TO, READ BACK BY AND VERIFIED WITH: L ADKINS,RN _1  02/15/18 MKELLY Performed at [facility], [place] [address], [place], [place] [address]   Troponin I     Status: Abnormal   Collection Time: 02/15/18  3:52 AM  Result Value Ref Range   Troponin I 16.47 (HH) <0.03 ng/mL    Comment: CRITICAL VALUE NOTED.  VALUE IS CONSISTENT WITH PREVIOUSLY REPORTED AND CALLED VALUE. Performed at [facility], [place] [address] [place], [place] [address]   Heparin level (unfractionated)     Status: None   Collection Time: 02/15/18  4:00 AM  Result Value Ref Range   Heparin Unfractionated 0.37 0.30 - 0.70 IU/mL    Comment:        IF HEPARIN RESULTS ARE BELOW EXPECTED VALUES, AND PATIENT DOSAGE HAS BEEN CONFIRMED, SUGGEST FOLLOW UP TESTING OF ANTITHROMBIN III LEVELS. Performed at [facility], [place] [address] [place], [place] [address]   CBC     Status: Abnormal   Collection Time: 02/15/18  4:20 AM  Result Value Ref Range   WBC 7.1 4.0 - 10.5 K/uL   RBC 3.23 (L) 4.22 - 5.81 MIL/uL   Hemoglobin 10.0 (L) 13.0 - 17.0 g/dL   HCT 29.1 (L) 39.0 - 52.0 %   MCV 90.1 78.0 - 100.0 fL   MCH 31.0 26.0 - 34.0 pg   MCHC 34.4 30.0 - 36.0 g/dL   RDW 19.5 (H) 11.5 - 15.5 %   Platelets 260 150 - 400 K/uL    Comment: Performed at [facility], [place] [address] [place], [place] [address]  Procalcitonin     Status: None   Collection Time: 02/15/18  4:20 AM  Result Value Ref Range   Procalcitonin 17.60 ng/mL    Comment:        Interpretation: PCT >= 10 ng/mL: Important systemic inflammatory response, almost  exclusively due to severe bacterial sepsis or septic shock. (NOTE)       Sepsis PCT Algorithm           Lower Respiratory Tract                                      Infection PCT Algorithm    ----------------------------     ----------------------------         PCT < 0.25 ng/mL                PCT < 0.10 ng/mL         Strongly encourage  Strongly discourage   discontinuation of antibiotics    initiation of antibiotics    ----------------------------     -----------------------------       PCT 0.25 - 0.50 ng/mL            PCT 0.10 - 0.25 ng/mL               OR       >80% decrease in PCT            Discourage initiation of                                            antibiotics      Encourage discontinuation           of antibiotics    ----------------------------     -----------------------------         PCT >= 0.50 ng/mL              PCT 0.26 - 0.50 ng/mL                AND       <80% decrease in PCT             Encourage initiation of                                             antibiotics       Encourage continuation           of antibiotics    ----------------------------     -----------------------------        PCT >= 0.50 ng/mL                  PCT > 0.50 ng/mL               AND         increase in PCT                  Strongly encourage                                      initiation of antibiotics    Strongly encourage escalation           of antibiotics                                     -----------------------------                                           PCT <= 0.25 ng/mL                                                 OR                                        >  80% decrease in PCT                                     Discontinue / Do not initiate                                             antibiotics Performed at [place] [address] [place], [place] [address]   Basic metabolic panel     Status: Abnormal   Collection Time:  02/15/18  4:20 AM  Result Value Ref Range   Sodium 142 135 - 145 mmol/L   Potassium 3.6 3.5 - 5.1 mmol/L    Comment: DELTA CHECK NOTED   Chloride 115 (H) 101 - 111 mmol/L   CO2 17 (L) 22 - 32 mmol/L   Glucose, Bld 108 (H) 65 - 99 mg/dL   BUN 30 (H) 6 - 20 mg/dL   Creatinine, Ser 0.96 0.61 - 1.24 mg/dL   Calcium 7.4 (L) 8.9 - 10.3 mg/dL   GFR calc non Af Amer >60 >60 mL/min   GFR calc Af Amer >60 >60 mL/min    Comment: (NOTE) The eGFR has been calculated using the CKD EPI equation. This calculation has not been validated in all clinical situations. eGFR's persistently <60 mL/min signify possible Chronic Kidney Disease.    Anion gap 10 5 - 15    Comment: Performed at [facility], [place] [address] [place], [place] [address]   Dg Chest Portable 1 View  Result Date: 02/14/2018 CLINICAL DATA:  Tachypnea. Weakness. Possible sepsis. Gastric cancer. EXAM: PORTABLE CHEST 1 VIEW COMPARISON:  Chest CT 02/10/2018 and radiograph 11/20/2017 FINDINGS: A left subclavian Port-A-Cath terminates over the upper to mid SVC. The cardiac silhouette is normal in size. Aortic atherosclerosis is noted. Pleural calcifications are again Pratt in the right hemithorax. Patchy airspace and fine interstitial opacities are present in both lung bases. No sizable pleural effusion or pneumothorax is identified. No acute osseous abnormality is Pratt. IMPRESSION: Mild bibasilar opacity suspicious for pneumonia. Electronically Signed   By: Logan Bores M.D.   On: 02/14/2018 18:53    Pending Labs Unresulted Labs (From admission, onward)   Start     Ordered   02/16/18 0500  CBC  Tomorrow morning,   R     02/15/18 0918   02/16/18 2355  Basic metabolic panel  Tomorrow morning,   R     02/15/18 0918   02/16/18 0500  Magnesium  Tomorrow morning,   R     02/15/18 0918   02/16/18 0500  Phosphorus  Tomorrow morning,   R     02/15/18 0918   02/16/18 0500  Troponin I  Tomorrow morning,   R     02/15/18 0918    02/15/18 1300  Heparin level (unfractionated)  Once-Timed,   STAT     02/15/18 0630   02/15/18 0500  CBC  Daily,   R     02/14/18 1927   02/15/18 0500  Procalcitonin  Daily,   R     02/14/18 1929   02/15/18 0500  Creatinine, serum  Daily,   R     02/14/18 2116   02/15/18 0105  Culture, expectorated sputum-assessment  Once,   R     02/15/18 0104  02/15/18 0041  Respiratory Panel by PCR  (Respiratory virus panel)  Once,   R     02/15/18 0040   02/14/18 2152  Troponin I  Now then every 6 hours,   R     02/14/18 2151   02/14/18 1803  Gastrointestinal Panel by PCR , Stool  (Gastrointestinal Panel by PCR, Stool)  Once,   R     02/14/18 1802   02/14/18 1708  Culture, blood (Routine x 2)  BLOOD CULTURE X 2,   STAT    Question:  Patient immune status  Answer:  Immunocompromised   02/14/18 1707   02/14/18 1708  Urine culture  STAT,   STAT    Question:  Patient immune status  Answer:  Immunocompromised   02/14/18 1707      Vitals/Pain Today's Vitals   02/15/18 0830 02/15/18 0845 02/15/18 1002 02/15/18 1056  BP: 123/73 110/72 123/70 123/69  Pulse: 85 91 85 86  Resp: 17 12 (!) 23 (!) 24  Temp:    (!) 97.4 F (36.3 C)  TempSrc:    Oral  SpO2: 95% 91% 94% 96%  Weight:      Height:      PainSc:        Isolation Precautions Droplet precaution  Medications Medications  aspirin EC tablet 81 mg (not administered)  clopidogrel (PLAVIX) tablet 75 mg (not administered)  heparin ADULT infusion 100 units/mL (25000 units/262m sodium chloride 0.45%) (700 Units/hr Intravenous New Bag/Given 02/14/18 2014)  ondansetron (ZOFRAN) tablet 8 mg (not administered)  0.9 %  sodium chloride infusion ( Intravenous Stopped 02/15/18 0300)  zolpidem (AMBIEN) tablet 5 mg (not administered)  HYDROcodone-acetaminophen (NORCO/VICODIN) 5-325 MG per tablet 1-2 tablet (not administered)  acetaminophen (TYLENOL) tablet 650 mg (not administered)    Or  acetaminophen (TYLENOL) suppository 650 mg (not administered)   ondansetron (ZOFRAN) tablet 4 mg (not administered)    Or  ondansetron (ZOFRAN) injection 4 mg (not administered)  vancomycin (VANCOCIN) IVPB 750 mg/150 ml premix (not administered)  piperacillin-tazobactam (ZOSYN) IVPB 3.375 g (3.375 g Intravenous New Bag/Given 02/15/18 0622)  prochlorperazine (COMPAZINE) tablet 10 mg (not administered)  fluconazole (DIFLUCAN) IVPB 400 mg (0 mg Intravenous Stopped 02/15/18 0730)    Followed by  fluconazole (DIFLUCAN) IVPB 200 mg (not administered)  norepinephrine (LEVOPHED) 4 mg in dextrose 5 % 250 mL (0.016 mg/mL) infusion (4 mcg/min Intravenous Rate/Dose Change 02/15/18 0221)  0.9 %  sodium chloride infusion (not administered)  piperacillin-tazobactam (ZOSYN) IVPB 3.375 g (0 g Intravenous Stopped 02/14/18 1841)  vancomycin (VANCOCIN) IVPB 1000 mg/200 mL premix (0 mg Intravenous Stopped 02/14/18 2002)  sodium chloride 0.9 % bolus 1,000 mL (0 mLs Intravenous Stopped 02/14/18 1846)    And  sodium chloride 0.9 % bolus 500 mL (0 mLs Intravenous Stopped 02/14/18 1757)    And  sodium chloride 0.9 % bolus 250 mL (0 mLs Intravenous Stopped 02/14/18 1741)  acetaminophen (TYLENOL) suppository 650 mg (650 mg Rectal Given 02/14/18 1734)  aspirin chewable tablet 324 mg (324 mg Oral Given 02/14/18 2014)  clopidogrel (PLAVIX) tablet 300 mg (300 mg Oral Given 02/14/18 2017)  heparin bolus via infusion 3,000 Units (3,000 Units Intravenous Bolus from Bag 02/14/18 2014)  sodium chloride 0.9 % bolus 1,000 mL (0 mLs Intravenous Stopped 02/14/18 2144)  sodium chloride 0.9 % bolus 500 mL (0 mLs Intravenous Stopped 02/15/18 0300)    Mobility walks with device

## 2018-02-15 NOTE — Progress Notes (Signed)
  Echocardiogram 2D Echocardiogram has been performed.  Donald Memoli T Ranell Finelli 02/15/2018, 9:48 AM

## 2018-02-15 NOTE — Progress Notes (Signed)
ANTICOAGULATION CONSULT NOTE  Pharmacy Consult for IV heparin Indication: anterior STEMI  No Known Allergies  Patient Measurements: Height: 5\' 9"  (175.3 cm) Weight: 124 lb (56.2 kg) IBW/kg (Calculated) : 70.7 Heparin Dosing Weight: TBW  Vital Signs: Temp: 98.3 F (36.8 C) (02/16 1230) Temp Source: Oral (02/16 1230) BP: 114/63 (02/16 1400) Pulse Rate: 90 (02/16 1400)  Labs: Recent Labs    02/14/18 1712 02/15/18 0032 02/15/18 0352 02/15/18 0400 02/15/18 0420 02/15/18 1125 02/15/18 1337  HGB 11.5*  --   --   --  10.0*  --   --   HCT 34.6*  --   --   --  29.1*  --   --   PLT 395  --   --   --  260  --   --   LABPROT 14.1  --   --   --   --   --   --   INR 1.10  --   --   --   --   --   --   HEPARINUNFRC  --   --   --  0.37  --   --  0.41  CREATININE 1.04  --   --   --  0.96  --   --   TROPONINI  --  12.65* 16.47*  --   --  21.08*  --     Estimated Creatinine Clearance: 48 mL/min (by C-G formula based on SCr of 0.96 mg/dL).   Medical History: Past Medical History:  Diagnosis Date  . Hyperglycemia 03/16/2013  . Hypertension   . Stomach cancer ([place])     Medications:  Medications Prior to Admission  Medication Sig Dispense Refill Last Dose  . aspirin EC 81 MG tablet Take 81 mg by mouth 3 (three) times a week.    02/14/2018 at Unknown time  . docusate sodium (COLACE) 100 MG capsule Take 1 capsule (100 mg total) by mouth 2 (two) times daily as needed for mild constipation. (Patient not taking: Reported on 02/13/2018) 30 capsule 1 Completed Course at Unknown time  . lidocaine-prilocaine (EMLA) cream Apply to affected area once (Patient not taking: Reported on 02/13/2018) 30 g 3 Completed Course at Unknown time  . ondansetron (ZOFRAN ODT) 8 MG disintegrating tablet Take 1 tablet (8 mg total) by mouth 2 (two) times daily as needed for nausea or vomiting. (Patient not taking: Reported on 02/13/2018) 20 tablet 1 Completed Course at Unknown time  . ondansetron (ZOFRAN) 8 MG  tablet Take 1 tablet (8 mg total) by mouth 2 (two) times daily as needed for refractory nausea / vomiting. Start on day 3 after chemotherapy. (Patient not taking: Reported on 02/13/2018) 30 tablet 1 Completed Course at Unknown time  . prochlorperazine (COMPAZINE) 10 MG tablet Take 1 tablet (10 mg total) by mouth every 6 (six) hours as needed (Nausea or vomiting). (Patient not taking: Reported on 02/13/2018) 30 tablet 1 Completed Course at Unknown time   Scheduled:  . aspirin EC  81 mg Oral Daily  . clopidogrel  75 mg Oral Daily   Infusions:  . sodium chloride 50 mL/hr at 02/15/18 1247  . sodium chloride 250 mL (02/15/18 1143)  . fluconazole (DIFLUCAN) IV    . heparin 700 Units/hr (02/14/18 2014)  . norepinephrine (LEVOPHED) Adult infusion Stopped (02/15/18 1230)  . piperacillin-tazobactam (ZOSYN)  IV 3.375 g (02/15/18 1345)  . vancomycin      Assessment: 35 yoM with PMH metastatic gastric cancer on chemo sent from [facility]  rigors, fever, and weakness. Found to be in sepsis and EKG also shows STEMI albeit without cardiac symptoms. Not a candidate for invasive procedures d/t multiple comorbidities. Cardiology is starting IV heparin per pharmacy along with ASA/Plavix.   Baseline INR wnl, aPTT not done  Prior anticoagulation: ASA 81 mg only  Significant events:  Today, 02/15/2018:  Confirmatory heparin level therapeutic at 0.41 on heparin 700 units/hr  No plan for heart catheterizaztion d/t co-morbidities  Troponin trending up  CBC: Hgb low, likely d/t chemo; Plt wnl  No bleeding or infusion issues per nursing  Goal of Therapy: Heparin level 0.3-0.7 units/ml Monitor platelets by anticoagulation protocol: Yes  Plan:  Continue Heparin at 700 units/hr IV infusion  Daily CBC, daily heparin level  Monitor for signs of bleeding or thrombosis  Doreene Eland, PharmD, BCPS.   Pager: [phone] 02/15/2018 2:40 PM

## 2018-02-15 NOTE — Consult Note (Signed)
Name: Lee Pratt MRN: [id number] DOB: 1934-11-22    ADMISSION DATE:  02/14/2018 CONSULTATION DATE:  02/14/2018  REFERRING MD :  Dr. Melina Copa   CHIEF COMPLAINT:  Lactic Acidosis   HISTORY OF PRESENT ILLNESS:   84 year old male with PMH of gastric adenocarcinoma with peritoneal metastasis stage IV s/p gastrojejunostomy due to gastric outlet syndrome, currently undergoing FOLFOX (has completed 5 cycles, last dosed on 02/12/18), HTN, Protein-calorie malnutrition  Presents to ED on 2/15 with weakness and increased lethargy. Seen in at outpatient oncology office for chemotherapy pump discontinuation. Temp 100.2 with rigors, surgical incision (where J-Tube was removed on 2/14) erythematous with purulent drainage. Arrived to ED Febrile, Hypoxic with tachypnea. Given 1.7L NS, Zosyn, and Vancomycin. LA  12.06. Initial EKG concerning for STEMI with inferior depressions. Cardiology consulted. Patient is not a candidate for intervention. PCCM asked to consult.   Upon assessment BP 100/58, HR 108, Patient is alert and oriented. Wife at bedside. Patient and wife states that even though they want to continue chemotherapy he wishes to NOT undergo CPR/Intubation or other aggressive medical therapies such as pressors/HD.   SIGNIFICANT EVENTS  2/15 > Presents to ED   STUDIES:  CXR 2/15 > A left subclavian Port-A-Cath terminates over the upper to mid SVC. The cardiac silhouette is normal in size. Aortic atherosclerosis is noted. Pleural calcifications are again seen in the right hemithorax. Patchy airspace and fine interstitial opacities are present in both lung bases. No sizable pleural effusion or pneumothorax is identified. No acute osseous abnormality is seen  PAST MEDICAL HISTORY :   has a past medical history of Hyperglycemia (03/16/2013), Hypertension, and Stomach cancer ([place]).  has a past surgical history that includes No past surgeries (12/21/2011); Esophagogastroduodenoscopy (N/A, 11/10/2017);  Gastrojejunostomy (N/A, 11/12/2017); and Portacath placement (Left, 11/20/2017). Prior to Admission medications   Medication Sig Start Date End Date Taking? Authorizing Provider  aspirin EC 81 MG tablet Take 81 mg by mouth 3 (three) times a week.    Yes [provider]  docusate sodium (COLACE) 100 MG capsule Take 1 capsule (100 mg total) by mouth 2 (two) times daily as needed for mild constipation. Patient not taking: Reported on 02/13/2018 12/04/17   [name], NP  lidocaine-prilocaine (EMLA) cream Apply to affected area once Patient not taking: Reported on 02/13/2018 12/04/17   [name], MD  ondansetron (ZOFRAN ODT) 8 MG disintegrating tablet Take 1 tablet (8 mg total) by mouth 2 (two) times daily as needed for nausea or vomiting. Patient not taking: Reported on 02/13/2018 12/04/17   [name], NP  ondansetron (ZOFRAN) 8 MG tablet Take 1 tablet (8 mg total) by mouth 2 (two) times daily as needed for refractory nausea / vomiting. Start on day 3 after chemotherapy. Patient not taking: Reported on 02/13/2018 12/04/17   [name], MD  prochlorperazine (COMPAZINE) 10 MG tablet Take 1 tablet (10 mg total) by mouth every 6 (six) hours as needed (Nausea or vomiting). Patient not taking: Reported on 02/13/2018 12/04/17   [name], MD   No Known Allergies  FAMILY HISTORY:  family history includes Cancer in his brother and father. SOCIAL HISTORY:  reports that he quit smoking about 5 years ago. His smoking use included cigarettes. He has a 12.50 pack-year smoking history. he has never used smokeless tobacco. He reports that he does not drink alcohol or use drugs.  REVIEW OF SYSTEMS:   All negative; except for those that are bolded, which indicate positives.  Constitutional: weight loss, weight gain, night sweats, fevers, chills, fatigue, weakness.  HEENT: headaches, sore throat, sneezing, nasal congestion, post nasal drip, difficulty swallowing, tooth/dental problems, visual  complaints, visual changes, ear aches. Neuro: difficulty with speech, weakness, numbness, ataxia. CV:  chest pain, orthopnea, PND, swelling in lower extremities, dizziness, palpitations, syncope.  Resp: cough, hemoptysis, dyspnea, wheezing. GI: heartburn, indigestion, abdominal pain, nausea, vomiting, diarrhea, constipation, change in bowel habits, loss of appetite, hematemesis, melena, hematochezia.  GU: dysuria, change in color of urine, urgency or frequency, flank pain, hematuria. MSK: joint pain or swelling, decreased range of motion. Psych: change in mood or affect, depression, anxiety, suicidal ideations, homicidal ideations. Skin: rash, itching, bruising.   SUBJECTIVE:   VITAL SIGNS: Temp:  [98.2 F (36.8 C)-102.3 F (39.1 C)] 98.6 F (37 C) (02/15 2205) Pulse Rate:  [92-140] 126 (02/15 2306) Resp:  [16-25] 22 (02/15 2330) BP: (61-172)/(49-110) 106/66 (02/15 2330) SpO2:  [89 %-97 %] 92 % (02/15 2306) Weight:  [56.2 kg (124 lb)] 56.2 kg (124 lb) (02/15 1706)  PHYSICAL EXAMINATION: General:  Elderly male, no distress  Neuro:  Alert, oriented, follows commands  HEENT:  Dry MM  Cardiovascular:  Tachy, no MRG  Lungs:  Diminished breath sounds, no wheeze/crackles  Abdomen:  Previous J-Tube site red with drainage, active bowel sounds  Musculoskeletal:  -edema  Skin:  Warm, dry   Recent Labs  Lab 02/12/18 1101 02/14/18 1712  NA 137 139  K 4.3 4.8  CL 101 105  CO2 27 16*  BUN 25 37*  CREATININE 0.78 1.04  GLUCOSE 98 117*   Recent Labs  Lab 02/12/18 1104 02/14/18 1712  HGB  --  11.5*  HCT 32.1* 34.6*  WBC 4.5 6.1  PLT 252 395   Dg Chest Portable 1 View  Result Date: 02/14/2018 CLINICAL DATA:  Tachypnea. Weakness. Possible sepsis. Gastric cancer. EXAM: PORTABLE CHEST 1 VIEW COMPARISON:  Chest CT 02/10/2018 and radiograph 11/20/2017 FINDINGS: A left subclavian Port-A-Cath terminates over the upper to mid SVC. The cardiac silhouette is normal in size. Aortic  atherosclerosis is noted. Pleural calcifications are again seen in the right hemithorax. Patchy airspace and fine interstitial opacities are present in both lung bases. No sizable pleural effusion or pneumothorax is identified. No acute osseous abnormality is seen. IMPRESSION: Mild bibasilar opacity suspicious for pneumonia. Electronically Signed   By: Logan Bores M.D.   On: 02/14/2018 18:53    ASSESSMENT / PLAN:  Acute Hypoxic Respiratory Failure in setting suspected CAP  CXR with mild bibasilar opacity  Plan  -Wean Supplemental Oxygen to maintain Saturation >92 (Currently on 2L [place])  -Trend CXR -Pulmonary Hygiene  -RVP and Sputum pending   Lactic Acidosis concern for type B due to malignancy and toxin induced due to recent usage of FOLFOX  LA 12.06 > 3.74 Plan  -Trend LA  -Gentle Hydration, NS @ 50 ml/hr   Concern for CAP with bibasilar opacities  Immunocompromised  Previous J-Tube site with drainage  Febrile  Plan  -Trend WBC and Fever Curve  -Follow Culture Data  -Continue Vancomycin and Zosyn   STEMI vs Pericarditis induced by FOLFOX  -no cardiac symptoms, denies chest pain  Plan  -Cardiology Following > -Not candidate for cath/PCI -ECHO pending  -Plans for Heparin/ASA/Plavix > Speak to Cardiology about suspected pericarditis   Gastric Adenocarcinoma with peritoneal metastasis, recently completed cycle 5 of FOLFOX  Plan  -Per Oncology   -Palliative Care Consult   As above due to conversation with family/patient regarding  goals of care, patient wants minimal treatment. Spoke to ED attending and Triad MD regarding goals of care and patient wishes. Due to DNR status and wishes to not undergo aggressive medical treatments patient can be admitted to step-down with triad service.   Hayden Pedro, AGACNP-BC [facility]  Pgr: [phone]  PCCM Pgr: [phone]

## 2018-02-15 NOTE — Progress Notes (Signed)
ANTICOAGULATION CONSULT NOTE - Follow Up Consult  Pharmacy Consult for Heparin Indication: chest pain/ACS  No Known Allergies  Patient Measurements: Height: 5\' 9"  (175.3 cm) Weight: 124 lb (56.2 kg) IBW/kg (Calculated) : 70.7 Heparin Dosing Weight:   Vital Signs: Temp: 98.6 F (37 C) (02/15 2205) Temp Source: Oral (02/15 2205) BP: 117/75 (02/16 0615) Pulse Rate: 86 (02/16 0615)  Labs: Recent Labs    02/12/18 1101 02/12/18 1104 02/14/18 1712 02/15/18 0032 02/15/18 0352 02/15/18 0400 02/15/18 0420  HGB  --   --  11.5*  --   --   --  10.0*  HCT  --  32.1* 34.6*  --   --   --  29.1*  PLT  --  252 395  --   --   --  260  LABPROT  --   --  14.1  --   --   --   --   INR  --   --  1.10  --   --   --   --   HEPARINUNFRC  --   --   --   --   --  0.37  --   CREATININE 0.78  --  1.04  --   --   --  0.96  TROPONINI  --   --   --  12.65* 16.47*  --   --     Estimated Creatinine Clearance: 48 mL/min (by C-G formula based on SCr of 0.96 mg/dL).   Medications:  Infusions:  . sodium chloride Stopped (02/15/18 0300)  . sodium chloride    . fluconazole (DIFLUCAN) IV 400 mg (02/15/18 0520)   Followed by  . fluconazole (DIFLUCAN) IV    . heparin 700 Units/hr (02/14/18 2014)  . norepinephrine (LEVOPHED) Adult infusion 4 mcg/min (02/15/18 0221)  . piperacillin-tazobactam (ZOSYN)  IV 3.375 g (02/15/18 0622)  . vancomycin      Assessment: Patient with heparin level at goal.  No heparin issues noted.  Goal of Therapy:  Heparin level 0.3-0.7 units/ml Monitor platelets by anticoagulation protocol: Yes   Plan:  Continue heparin drip at current rate Recheck level at [address], [place] 02/15/2018,6:29 AM

## 2018-02-16 ENCOUNTER — Inpatient Hospital Stay (HOSPITAL_COMMUNITY): Payer: Medicare Other

## 2018-02-16 DIAGNOSIS — I213 ST elevation (STEMI) myocardial infarction of unspecified site: Secondary | ICD-10-CM

## 2018-02-16 DIAGNOSIS — I2109 ST elevation (STEMI) myocardial infarction involving other coronary artery of anterior wall: Secondary | ICD-10-CM

## 2018-02-16 DIAGNOSIS — I219 Acute myocardial infarction, unspecified: Secondary | ICD-10-CM

## 2018-02-16 LAB — BASIC METABOLIC PANEL
ANION GAP: 9 (ref 5–15)
BUN: 25 mg/dL — ABNORMAL HIGH (ref 6–20)
CHLORIDE: 110 mmol/L (ref 101–111)
CO2: 20 mmol/L — AB (ref 22–32)
CREATININE: 1 mg/dL (ref 0.61–1.24)
Calcium: 7.7 mg/dL — ABNORMAL LOW (ref 8.9–10.3)
GFR calc non Af Amer: >60 mL/min (ref >60)
GLUCOSE: 107 mg/dL — AB (ref 65–99)
Potassium: 2.7 mmol/L — CL (ref 3.5–5.1)
Sodium: 139 mmol/L (ref 135–145)

## 2018-02-16 LAB — RESPIRATORY PANEL BY PCR
ADENOVIRUS-RVPPCR: NOT DETECTED
BORDETELLA PERTUSSIS-RVPCR: NOT DETECTED
CORONAVIRUS 229E-RVPPCR: NOT DETECTED
CORONAVIRUS HKU1-RVPPCR: NOT DETECTED
CORONAVIRUS NL63-RVPPCR: NOT DETECTED
CORONAVIRUS OC43-RVPPCR: NOT DETECTED
Chlamydophila pneumoniae: NOT DETECTED
Influenza A: NOT DETECTED
Influenza B: NOT DETECTED
METAPNEUMOVIRUS-RVPPCR: NOT DETECTED
Mycoplasma pneumoniae: NOT DETECTED
PARAINFLUENZA VIRUS 1-RVPPCR: NOT DETECTED
PARAINFLUENZA VIRUS 2-RVPPCR: NOT DETECTED
PARAINFLUENZA VIRUS 3-RVPPCR: NOT DETECTED
Parainfluenza Virus 4: NOT DETECTED
RHINOVIRUS / ENTEROVIRUS - RVPPCR: NOT DETECTED
Respiratory Syncytial Virus: NOT DETECTED

## 2018-02-16 LAB — HEPARIN LEVEL (UNFRACTIONATED)
Heparin Unfractionated: 0.25 IU/mL — ABNORMAL LOW (ref 0.30–0.70)
Heparin Unfractionated: 0.31 IU/mL (ref 0.30–0.70)

## 2018-02-16 LAB — URINE CULTURE: Culture: NO GROWTH

## 2018-02-16 LAB — CBC
HEMATOCRIT: 29.8 % — AB (ref 39.0–52.0)
Hemoglobin: 10.1 g/dL — ABNORMAL LOW (ref 13.0–17.0)
MCH: 30.7 pg (ref 26.0–34.0)
MCHC: 33.9 g/dL (ref 30.0–36.0)
MCV: 90.6 fL (ref 78.0–100.0)
Platelets: 212 10*3/uL (ref 150–400)
RBC: 3.29 MIL/uL — AB (ref 4.22–5.81)
RDW: 19.3 % — ABNORMAL HIGH (ref 11.5–15.5)
WBC: 8.1 10*3/uL (ref 4.0–10.5)

## 2018-02-16 LAB — LACTIC ACID, PLASMA: Lactic Acid, Venous: 1.1 mmol/L (ref 0.5–1.9)

## 2018-02-16 LAB — PHOSPHORUS: PHOSPHORUS: 2.5 mg/dL (ref 2.5–4.6)

## 2018-02-16 LAB — PROCALCITONIN: Procalcitonin: 11.65 ng/mL

## 2018-02-16 LAB — EXPECTORATED SPUTUM ASSESSMENT W REFEX TO RESP CULTURE

## 2018-02-16 LAB — EXPECTORATED SPUTUM ASSESSMENT W GRAM STAIN, RFLX TO RESP C

## 2018-02-16 LAB — MAGNESIUM: Magnesium: 2.1 mg/dL (ref 1.7–2.4)

## 2018-02-16 LAB — TROPONIN I: TROPONIN I: 14.54 ng/mL — AB (ref <0.03)

## 2018-02-16 MED ORDER — SODIUM CHLORIDE 0.9% FLUSH
10.0000 mL | INTRAVENOUS | Status: DC | PRN
  Administered 2018-02-19: 20 mL
  Filled 2018-02-16: qty 40

## 2018-02-16 MED ORDER — METOPROLOL TARTRATE 25 MG PO TABS
25.0000 mg | ORAL_TABLET | Freq: Two times a day (BID) | ORAL | Status: DC
  Administered 2018-02-16 – 2018-02-18 (×5): 25 mg via ORAL
  Filled 2018-02-16 (×5): qty 1

## 2018-02-16 MED ORDER — POTASSIUM CHLORIDE 20 MEQ/15ML (10%) PO SOLN
40.0000 meq | Freq: Once | ORAL | Status: AC
  Administered 2018-02-16: 40 meq via ORAL
  Filled 2018-02-16: qty 30

## 2018-02-16 MED ORDER — CHLORHEXIDINE GLUCONATE CLOTH 2 % EX PADS
6.0000 | MEDICATED_PAD | Freq: Every day | CUTANEOUS | Status: DC
  Administered 2018-02-16 – 2018-02-20 (×4): 6 via TOPICAL

## 2018-02-16 MED ORDER — METOPROLOL SUCCINATE ER 25 MG PO TB24: 50.0000 mg | ORAL_TABLET | Freq: Two times a day (BID) | ORAL | Status: DC

## 2018-02-16 MED ORDER — POTASSIUM CHLORIDE 20 MEQ/15ML (10%) PO SOLN: 40.0000 meq | Freq: Two times a day (BID) | ORAL | Status: DC

## 2018-02-16 MED ORDER — POTASSIUM CHLORIDE CRYS ER 20 MEQ PO TBCR
40.0000 meq | EXTENDED_RELEASE_TABLET | Freq: Two times a day (BID) | ORAL | Status: AC
  Administered 2018-02-16 (×2): 40 meq via ORAL
  Filled 2018-02-16 (×3): qty 2

## 2018-02-16 MED ORDER — ATORVASTATIN CALCIUM 40 MG PO TABS
40.0000 mg | ORAL_TABLET | Freq: Every day | ORAL | Status: DC
  Administered 2018-02-16 – 2018-02-21 (×6): 40 mg via ORAL
  Filled 2018-02-16 (×6): qty 1

## 2018-02-16 MED ORDER — FUROSEMIDE 10 MG/ML IJ SOLN
20.0000 mg | Freq: Once | INTRAMUSCULAR | Status: AC
  Administered 2018-02-16: 20 mg via INTRAVENOUS
  Filled 2018-02-16: qty 2

## 2018-02-16 MED ORDER — HEPARIN BOLUS VIA INFUSION
800.0000 [IU] | Freq: Once | INTRAVENOUS | Status: AC
  Administered 2018-02-16: 800 [IU] via INTRAVENOUS
  Filled 2018-02-16: qty 800

## 2018-02-16 NOTE — Progress Notes (Signed)
Progress Note  Patient Name: Lee Pratt Date of Encounter: 02/16/2018  Primary Cardiologist: No primary care provider on file.   Subjective   No CP  Breathing OK   Back pain  Inpatient Medications    Scheduled Meds: . aspirin EC  81 mg Oral Daily  . Chlorhexidine Gluconate Cloth  6 each Topical Q0600  . Chlorhexidine Gluconate Cloth  6 each Topical Daily  . clopidogrel  75 mg Oral Daily  . potassium chloride  40 mEq Oral BID   Continuous Infusions: . sodium chloride 250 mL (02/15/18 1930)  . heparin 800 Units/hr (02/16/18 0746)  . norepinephrine (LEVOPHED) Adult infusion Stopped (02/15/18 1230)  . piperacillin-tazobactam (ZOSYN)  IV 3.375 g (02/16/18 0557)  . vancomycin Stopped (02/15/18 1924)   PRN Meds: sodium chloride, acetaminophen **OR** acetaminophen, HYDROcodone-acetaminophen, ondansetron **OR** ondansetron (ZOFRAN) IV, prochlorperazine, sodium chloride flush, zolpidem   Vital Signs    Vitals:   02/16/18 0700 02/16/18 0713 02/16/18 0800 02/16/18 0900  BP: 139/84  (!) 137/59 (!) 143/87  Pulse: (!) 103  98 99  Resp: (!) 25  (!) 21 (!) 25  Temp:  98.9 F (37.2 C)    TempSrc:  Axillary    SpO2: 91%  96% 97%  Weight:      Height:        Intake/Output Summary (Last 24 hours) at 02/16/2018 0928 Last data filed at 02/16/2018 0800 Gross per 24 hour  Intake 1876.02 ml  Output 1075 ml  Net 801.02 ml   Filed Weights   02/14/18 1706 02/16/18 0312  Weight: 124 lb (56.2 kg) 128 lb 1.4 oz (58.1 kg)    Telemetry    SR/ST   - Personally Reviewed  ECG    No new EKG  Personally Reviewed  Physical Exam   GEN: No acute distress.   Neck: No JVD Cardiac: RRR, Gr I-II systolic  Murmur  No rubs, or gallops.  Respiratory: Mild rhonchi   GI: Soft, nontender, non-distended  MS: No edema; No deformity. Neuro:  Nonfocal  Psych: Normal affect   Labs    Chemistry Recent Labs  Lab 02/12/18 1101 02/14/18 1712 02/15/18 0420 02/16/18 0357  NA 137 139 142 139  K  4.3 4.8 3.6 2.7*  CL 101 105 115* 110  CO2 27 16* 17* 20*  GLUCOSE 98 117* 108* 107*  BUN 25 37* 30* 25*  CREATININE 0.78 1.04 0.96 1.00  CALCIUM 8.7 8.6* 7.4* 7.7*  PROT 6.8 6.8  --   --   ALBUMIN 2.7* 3.0*  --   --   AST 24 44*  --   --   ALT 26 31  --   --   ALKPHOS 118 94  --   --   BILITOT 0.4 0.6  --   --   GFRNONAA >60 >60 >60 >60  GFRAA >60 >60 >60 >60  ANIONGAP 9 18* 10 9     Hematology Recent Labs  Lab 02/14/18 1712 02/15/18 0420 02/16/18 0357  WBC 6.1 7.1 8.1  RBC 3.73* 3.23* 3.29*  HGB 11.5* 10.0* 10.1*  HCT 34.6* 29.1* 29.8*  MCV 92.8 90.1 90.6  MCH 30.8 31.0 30.7  MCHC 33.2 34.4 33.9  RDW 19.4* 19.5* 19.3*  PLT 395 260 212    Cardiac Enzymes Recent Labs  Lab 02/15/18 0032 02/15/18 0352 02/15/18 1125 02/16/18 0357  TROPONINI 12.65* 16.47* 21.08* 14.54*    Recent Labs  Lab 02/14/18 1729  TROPIPOC 0.04     BNPNo  results for input(s): BNP, PROBNP in the last 168 hours.   DDimer No results for input(s): DDIMER in the last 168 hours.   Radiology    Dg Chest Port 1 View  Result Date: 02/16/2018 CLINICAL DATA:  Acute respiratory failure. EXAM: PORTABLE CHEST 1 VIEW COMPARISON:  Radiographs 02/14/2018.  CT 02/10/2018 FINDINGS: Left chest port remains in place. Development of diffuse perihilar opacities and peribronchial thickening suspicious for pulmonary edema. Rounded density in the right mid lung corresponds to is likely fluid in the right minor fissure. Small bilateral pleural effusions are new. Heart size normal. Aortic atherosclerosis. Calcified densities in the right hemithorax correspond to pleural calcifications on CT. IMPRESSION: Development of CHF with moderate pulmonary edema and small pleural effusions. Electronically Signed   By: Jeb Levering M.D.   On: 02/16/2018 05:24   Dg Chest Portable 1 View  Result Date: 02/14/2018 CLINICAL DATA:  Tachypnea. Weakness. Possible sepsis. Gastric cancer. EXAM: PORTABLE CHEST 1 VIEW COMPARISON:   Chest CT 02/10/2018 and radiograph 11/20/2017 FINDINGS: A left subclavian Port-A-Cath terminates over the upper to mid SVC. The cardiac silhouette is normal in size. Aortic atherosclerosis is noted. Pleural calcifications are again seen in the right hemithorax. Patchy airspace and fine interstitial opacities are present in both lung bases. No sizable pleural effusion or pneumothorax is identified. No acute osseous abnormality is seen. IMPRESSION: Mild bibasilar opacity suspicious for pneumonia. Electronically Signed   By: Logan Bores M.D.   On: 02/14/2018 18:53    Cardiac Studies  ------------------------------------------------------------------- LV EF: 35% -   40%  ------------------------------------------------------------------- History:   PMH:  chest pain  Murmur.  PMH:   Myocardial infarction.  Risk factors: Hypertension. Dyslipidemia.  ------------------------------------------------------------------- Study Conclusions  - Left ventricle: The cavity size was normal. There was mild   concentric hypertrophy. Systolic function was moderately reduced.   The estimated ejection fraction was in the range of 35% to 40%.   Anterior, anteroseptal, apical and inferoapical severe   hypokinesis to akinesis, suggestive of LAD territory   ischemia/infarct. Doppler parameters are consistent with   pseudonormal left ventricular relaxation (grade 2 diastolic   dysfunction). LV filling pressure is elevated. - Aortic valve: Mildly calcified with mild stenosis. There was mild   regurgitation. Valve area (Vmax): 1.67 cm^2. - Mitral valve: Mildly thickened leaflets . There was mild   regurgitation. - Left atrium: The atrium was normal in size. - Right ventricle: The cavity size was mildly dilated. Mild   hypokinesis. - Tricuspid valve: There was mild regurgitation. - Pulmonary arteries: PA peak pressure: 47 mm Hg (S). - Inferior vena cava: The vessel was dilated. The respirophasic   diameter  changes were blunted (< 50%), consistent with elevated   central venous pressure.  Impressions:  - Compared to a prior study in 2012, there has been a significant   reduction in LVEF to 35-40% with severe LAD territory wall motion   abnormality which is new, suggestive of ischemia or infarct -   there is moderate diastolic dysfunction with elevated LV filling   pressure.    Patient Profile     84 y.o. male hx of gastric cancer with mets,HTN who  Was admitted from clinic on Friday with F/ weakness, rigors  EKG with ST elevation     Assessment & Plan    1  STEMI   Pt has ruled in for myocardial infarction   Seen by Ezzie Dural on Friday   With concurrent medical problems was not a candidate for  cath / intervention.  Echo as noted above . Remains CP free Will add low dose b blockada and ACE I to easeworkload on heart Contine ASA and hepariin   CCM has seen pt  Recomm IV lasix x 1  Watch output  Will add statin   2  ID  Being treated for CAP  3  Onc  Being treated for gastric adenocarcinoma      For questions or updates, please contact [facility] Please consult [email or web address] for contact info under Cardiology/STEMI.      Signed, Dorris Carnes, MD  02/16/2018, 9:28 AM

## 2018-02-16 NOTE — Progress Notes (Signed)
[facility] for IV heparin Indication: anterior STEMI  No Known Allergies  Patient Measurements: Height: 5\' 9"  (175.3 cm) Weight: 128 lb 1.4 oz (58.1 kg) IBW/kg (Calculated) : 70.7 Heparin Dosing Weight: 56kg  Vital Signs: Temp: 97.3 F (36.3 C) (02/17 0312) Temp Source: Axillary (02/17 0312) BP: 139/84 (02/17 0700) Pulse Rate: 103 (02/17 0700)  Labs: Recent Labs    02/14/18 1712  02/15/18 0352 02/15/18 0400 02/15/18 0420 02/15/18 1125 02/15/18 1337 02/16/18 0357  HGB 11.5*  --   --   --  10.0*  --   --  10.1*  HCT 34.6*  --   --   --  29.1*  --   --  29.8*  PLT 395  --   --   --  260  --   --  212  LABPROT 14.1  --   --   --   --   --   --   --   INR 1.10  --   --   --   --   --   --   --   HEPARINUNFRC  --   --   --  0.37  --   --  0.41 0.25*  CREATININE 1.04  --   --   --  0.96  --   --  1.00  TROPONINI  --    < > 16.47*  --   --  21.08*  --  14.54*   < > = values in this interval not displayed.    Estimated Creatinine Clearance: 46.8 mL/min (by C-G formula based on SCr of 1 mg/dL).   Medical History: Past Medical History:  Diagnosis Date  . Hyperglycemia 03/16/2013  . Hypertension   . Stomach cancer ([place])     Medications:  Medications Prior to Admission  Medication Sig Dispense Refill Last Dose  . aspirin EC 81 MG tablet Take 81 mg by mouth 3 (three) times a week.    02/14/2018 at Unknown time  . docusate sodium (COLACE) 100 MG capsule Take 1 capsule (100 mg total) by mouth 2 (two) times daily as needed for mild constipation. (Patient not taking: Reported on 02/13/2018) 30 capsule 1 Completed Course at Unknown time  . lidocaine-prilocaine (EMLA) cream Apply to affected area once (Patient not taking: Reported on 02/13/2018) 30 g 3 Completed Course at Unknown time  . ondansetron (ZOFRAN ODT) 8 MG disintegrating tablet Take 1 tablet (8 mg total) by mouth 2 (two) times daily as needed for nausea or vomiting. (Patient not taking:  Reported on 02/13/2018) 20 tablet 1 Completed Course at Unknown time  . ondansetron (ZOFRAN) 8 MG tablet Take 1 tablet (8 mg total) by mouth 2 (two) times daily as needed for refractory nausea / vomiting. Start on day 3 after chemotherapy. (Patient not taking: Reported on 02/13/2018) 30 tablet 1 Completed Course at Unknown time  . prochlorperazine (COMPAZINE) 10 MG tablet Take 1 tablet (10 mg total) by mouth every 6 (six) hours as needed (Nausea or vomiting). (Patient not taking: Reported on 02/13/2018) 30 tablet 1 Completed Course at Unknown time   Scheduled:  . aspirin EC  81 mg Oral Daily  . clopidogrel  75 mg Oral Daily  . potassium chloride  40 mEq Oral BID   Infusions:  . sodium chloride 50 mL/hr at 02/15/18 1930  . sodium chloride 250 mL (02/15/18 1930)  . fluconazole (DIFLUCAN) IV Stopped (02/15/18 2226)  . heparin 700 Units/hr (02/16/18 0700)  .  norepinephrine (LEVOPHED) Adult infusion Stopped (02/15/18 1230)  . piperacillin-tazobactam (ZOSYN)  IV 3.375 g (02/16/18 0557)  . vancomycin Stopped (02/15/18 1924)    Assessment: 51 yoM with PMH metastatic gastric cancer on chemo sent from [facility] rigors, fever, and weakness. Found to be in sepsis and EKG also shows STEMI albeit without cardiac symptoms. Not a candidate for invasive procedures d/t multiple comorbidities. Cardiology is starting IV heparin per pharmacy along with ASA/Plavix.   Baseline INR wnl, aPTT not done  Prior anticoagulation: ASA 81 mg only  Significant events:  Today, 02/16/2018:  Heparin level subtherapeutic now on 700 units/hr  No plan for heart catheterizaztion d/t co-morbidities  Troponin starting to trend down now  CBC: Hgb low, likely d/t chemo; Plt wnl  Per nursing there is a small mount of blood at the site of foley catheter. No infusion issues.  Goal of Therapy: Heparin level 0.3-0.7 units/ml Monitor platelets by anticoagulation protocol: Yes  Plan:  Give a bolus of heparin, 800 units then  increase rate to 800 units/hr  Heparin level in 6 hrs  Daily CBC, daily heparin level  Monitor for signs of bleeding or thrombosis  Romeo Rabon, PharmD. Mobile: [phone]. 02/16/2018,7:16 AM.

## 2018-02-16 NOTE — Progress Notes (Signed)
Brief Pharmacy Note re: IV heparin  O:  Heparin level= 0.31 on 800 units/hr       RN reports blood-tinged sputum       No plans for cardiac intervention  A/P:  Continue heparin at current rate.          Daily heparin level & CBC while on heparin          F/U length of therapy planned for heparin- will complete 48h of heparin therapy at 8p tonight.            Netta Cedars, PharmD, BCPS 02/16/2018@3 :38 PM

## 2018-02-16 NOTE — Progress Notes (Addendum)
Name: Lee Pratt MRN: [id number] DOB: 05/30/1934    ADMISSION DATE:  02/14/2018 CONSULTATION DATE:  02/14/2018  REFERRING MD :  Dr. Melina Copa   CHIEF COMPLAINT: Shock, Lactic Acidosis   HISTORY OF PRESENT ILLNESS:   84 year old male with PMH of gastric adenocarcinoma with peritoneal metastasis stage IV s/p gastrojejunostomy due to gastric outlet syndrome, currently undergoing FOLFOX (has completed 5 cycles, last dosed on 02/12/18), HTN, Protein-calorie malnutrition  Presents to ED on 2/15 with weakness and increased lethargy. Seen in at outpatient oncology office for chemotherapy pump discontinuation. Temp 100.2 with rigors, surgical incision (where J-Tube was removed on 2/14) erythematous with purulent drainage. Arrived to ED Febrile, Hypoxic with tachypnea. Given 1.7L NS, Zosyn, and Vancomycin. LA  12.06. Initial EKG concerning for STEMI with inferior depressions. Cardiology consulted. Patient is not a candidate for intervention. PCCM asked to consult.   Upon assessment BP 100/58, HR 108, Patient is alert and oriented. Wife at bedside. Patient and wife states that even though they want to continue chemotherapy he wishes to NOT undergo CPR/Intubation or other aggressive medical therapies such as pressors/HD.   SIGNIFICANT EVENTS  2/15 > Presents to ED   STUDIES:  CXR 2/15 > A left subclavian Port-A-Cath terminates over the upper to mid SVC. The cardiac silhouette is normal in size. Aortic atherosclerosis is noted. Pleural calcifications are again seen in the right hemithorax. Patchy airspace and fine interstitial opacities are present in both lung bases. No sizable pleural effusion or pneumothorax is identified. No acute osseous abnormality is seen  PAST MEDICAL HISTORY :   has a past medical history of Hyperglycemia (03/16/2013), Hypertension, and Stomach cancer ([place]).  has a past surgical history that includes No past surgeries (12/21/2011); Esophagogastroduodenoscopy (N/A, 11/10/2017);  Gastrojejunostomy (N/A, 11/12/2017); and Portacath placement (Left, 11/20/2017). Prior to Admission medications   Medication Sig Start Date End Date Taking? Authorizing Provider  aspirin EC 81 MG tablet Take 81 mg by mouth 3 (three) times a week.    Yes [provider]  docusate sodium (COLACE) 100 MG capsule Take 1 capsule (100 mg total) by mouth 2 (two) times daily as needed for mild constipation. Patient not taking: Reported on 02/13/2018 12/04/17   [name], NP  lidocaine-prilocaine (EMLA) cream Apply to affected area once Patient not taking: Reported on 02/13/2018 12/04/17   [name], MD  ondansetron (ZOFRAN ODT) 8 MG disintegrating tablet Take 1 tablet (8 mg total) by mouth 2 (two) times daily as needed for nausea or vomiting. Patient not taking: Reported on 02/13/2018 12/04/17   [name], NP  ondansetron (ZOFRAN) 8 MG tablet Take 1 tablet (8 mg total) by mouth 2 (two) times daily as needed for refractory nausea / vomiting. Start on day 3 after chemotherapy. Patient not taking: Reported on 02/13/2018 12/04/17   [name], MD  prochlorperazine (COMPAZINE) 10 MG tablet Take 1 tablet (10 mg total) by mouth every 6 (six) hours as needed (Nausea or vomiting). Patient not taking: Reported on 02/13/2018 12/04/17   [name], MD   No Known Allergies  FAMILY HISTORY:  family history includes Cancer in his brother and father. SOCIAL HISTORY:  reports that he quit smoking about 5 years ago. His smoking use included cigarettes. He has a 12.50 pack-year smoking history. he has never used smokeless tobacco. He reports that he does not drink alcohol or use drugs.  REVIEW OF SYSTEMS:   All negative; except for those that are bolded, which indicate positives.  Constitutional: weight loss, weight gain, night sweats, fevers, chills, fatigue, weakness.  HEENT: headaches, sore throat, sneezing, nasal congestion, post nasal drip, difficulty swallowing, tooth/dental problems, visual  complaints, visual changes, ear aches. Neuro: difficulty with speech, weakness, numbness, ataxia. CV:  chest pain, orthopnea, PND, swelling in lower extremities, dizziness, palpitations, syncope.  Resp: cough, hemoptysis, dyspnea, wheezing. GI: heartburn, indigestion, abdominal pain, nausea, vomiting, diarrhea, constipation, change in bowel habits, loss of appetite, hematemesis, melena, hematochezia.  GU: dysuria, change in color of urine, urgency or frequency, flank pain, hematuria. MSK: joint pain or swelling, decreased range of motion. Psych: change in mood or affect, depression, anxiety, suicidal ideations, homicidal ideations. Skin: rash, itching, bruising.   SUBJECTIVE:  Has been off pressors since noon yesterday.  Stable with no issues overnight  VITAL SIGNS: Temp:  [97.3 F (36.3 C)-98.9 F (37.2 C)] 98.9 F (37.2 C) (02/17 0713) Pulse Rate:  [81-110] 98 (02/17 0800) Resp:  [12-29] 21 (02/17 0800) BP: (107-157)/(56-128) 137/59 (02/17 0800) SpO2:  [90 %-96 %] 96 % (02/17 0800) Weight:  [128 lb 1.4 oz (58.1 kg)] 128 lb 1.4 oz (58.1 kg) (02/17 0312)  PHYSICAL EXAMINATION: Gen:      No acute distress, frail, cachectic HEENT:  EOMI, sclera anicteric Neck:     No masses; no thyromegaly Lungs:    Clear to auscultation bilaterally; normal respiratory effort CV:         Regular rate and rhythm; no murmurs Abd:      + bowel sounds; soft, non-tender; no palpable masses, no distension Ext:    No edema; adequate peripheral perfusion Skin:      Warm and dry; no rash Neuro: alert and oriented x 3   Recent Labs  Lab 02/14/18 1712 02/15/18 0420 02/16/18 0357  NA 139 142 139  K 4.8 3.6 2.7*  CL 105 115* 110  CO2 16* 17* 20*  BUN 37* 30* 25*  CREATININE 1.04 0.96 1.00  GLUCOSE 117* 108* 107*   Recent Labs  Lab 02/14/18 1712 02/15/18 0420 02/16/18 0357  HGB 11.5* 10.0* 10.1*  HCT 34.6* 29.1* 29.8*  WBC 6.1 7.1 8.1  PLT 395 260 212   Dg Chest Port 1 View  Result Date:  02/16/2018 CLINICAL DATA:  Acute respiratory failure. EXAM: PORTABLE CHEST 1 VIEW COMPARISON:  Radiographs 02/14/2018.  CT 02/10/2018 FINDINGS: Left chest port remains in place. Development of diffuse perihilar opacities and peribronchial thickening suspicious for pulmonary edema. Rounded density in the right mid lung corresponds to is likely fluid in the right minor fissure. Small bilateral pleural effusions are new. Heart size normal. Aortic atherosclerosis. Calcified densities in the right hemithorax correspond to pleural calcifications on CT. IMPRESSION: Development of CHF with moderate pulmonary edema and small pleural effusions. Electronically Signed   By: Jeb Levering M.D.   On: 02/16/2018 05:24   Dg Chest Portable 1 View  Result Date: 02/14/2018 CLINICAL DATA:  Tachypnea. Weakness. Possible sepsis. Gastric cancer. EXAM: PORTABLE CHEST 1 VIEW COMPARISON:  Chest CT 02/10/2018 and radiograph 11/20/2017 FINDINGS: A left subclavian Port-A-Cath terminates over the upper to mid SVC. The cardiac silhouette is normal in size. Aortic atherosclerosis is noted. Pleural calcifications are again seen in the right hemithorax. Patchy airspace and fine interstitial opacities are present in both lung bases. No sizable pleural effusion or pneumothorax is identified. No acute osseous abnormality is seen. IMPRESSION: Mild bibasilar opacity suspicious for pneumonia. Electronically Signed   By: Logan Bores M.D.   On: 02/14/2018 18:53  ASSESSMENT / PLAN: 84 year old with stage IV gastric adenocarcinoma on chemo presents with shock, ST elevation MI, elevated lactic acid  Acute Hypoxic Respiratory Failure in setting suspected CAP  CXR with mild bibasilar opacity  Plan  Wean down O2 Abx as above Awaiting respiratory virus panel.  Elevated Lactic Acidosis Cardiogenic shock, STEMI Echo noted with reduction in EF, wall motion abnormalities in the LAD territory LA 12.06 > 3.74 > 2.8 Plan  Stop IVF as he has  pulmonary edema Continue heparin drip, aspirin, Plavix Lasix 20 mg once. Followed by cardiology.  Not a candidate for invasive intervention. Troponin is trending down.  Concern for CAP with bibasilar opacities  Immunocompromised  Previous J-Tube site with drainage  Febrile  Plan  On vanco, zosyn. Stop diflucan as there is no evidence of fungal infection Follow Pct, cultures  Gastric Adenocarcinoma with peritoneal metastasis, recently completed cycle 5 of FOLFOX  Plan  Per Oncology Palliative care consulted.   Stable for transfer to SDU.  Marshell Garfinkel MD [facility] Pager [phone] If no answer or after 3pm call: [phone] 02/16/2018, 8:27 AM

## 2018-02-17 ENCOUNTER — Inpatient Hospital Stay (HOSPITAL_COMMUNITY): Payer: Medicare Other

## 2018-02-17 DIAGNOSIS — E43 Unspecified severe protein-calorie malnutrition: Secondary | ICD-10-CM

## 2018-02-17 DIAGNOSIS — C163 Malignant neoplasm of pyloric antrum: Secondary | ICD-10-CM

## 2018-02-17 DIAGNOSIS — J189 Pneumonia, unspecified organism: Secondary | ICD-10-CM

## 2018-02-17 DIAGNOSIS — C786 Secondary malignant neoplasm of retroperitoneum and peritoneum: Secondary | ICD-10-CM

## 2018-02-17 LAB — BASIC METABOLIC PANEL
Anion gap: 7 (ref 5–15)
BUN: 35 mg/dL — AB (ref 6–20)
CALCIUM: 8.1 mg/dL — AB (ref 8.9–10.3)
CO2: 24 mmol/L (ref 22–32)
CREATININE: 1.09 mg/dL (ref 0.61–1.24)
Chloride: 109 mmol/L (ref 101–111)
GFR calc Af Amer: >60 mL/min (ref >60)
GFR calc non Af Amer: >60 mL/min (ref >60)
GLUCOSE: 123 mg/dL — AB (ref 65–99)
Potassium: 3.6 mmol/L (ref 3.5–5.1)
Sodium: 140 mmol/L (ref 135–145)

## 2018-02-17 LAB — CBC
HCT: 30.1 % — ABNORMAL LOW (ref 39.0–52.0)
Hemoglobin: 10 g/dL — ABNORMAL LOW (ref 13.0–17.0)
MCH: 30.5 pg (ref 26.0–34.0)
MCHC: 33.2 g/dL (ref 30.0–36.0)
MCV: 91.8 fL (ref 78.0–100.0)
Platelets: 244 10*3/uL (ref 150–400)
RBC: 3.28 MIL/uL — AB (ref 4.22–5.81)
RDW: 18.5 % — ABNORMAL HIGH (ref 11.5–15.5)
WBC: 10.5 10*3/uL (ref 4.0–10.5)

## 2018-02-17 LAB — AEROBIC CULTURE  (SUPERFICIAL SPECIMEN): GRAM STAIN: NONE SEEN

## 2018-02-17 LAB — HEPARIN LEVEL (UNFRACTIONATED)
HEPARIN UNFRACTIONATED: 0.22 [IU]/mL — AB (ref 0.30–0.70)
HEPARIN UNFRACTIONATED: 0.21 [IU]/mL — AB (ref 0.30–0.70)

## 2018-02-17 LAB — AEROBIC CULTURE W GRAM STAIN (SUPERFICIAL SPECIMEN)

## 2018-02-17 LAB — MAGNESIUM: Magnesium: 2.1 mg/dL (ref 1.7–2.4)

## 2018-02-17 LAB — PHOSPHORUS: Phosphorus: 1.9 mg/dL — ABNORMAL LOW (ref 2.5–4.6)

## 2018-02-17 LAB — TROPONIN I: Troponin I: 7 ng/mL (ref <0.03)

## 2018-02-17 LAB — LACTIC ACID, PLASMA: Lactic Acid, Venous: 1.3 mmol/L (ref 0.5–1.9)

## 2018-02-17 MED ORDER — HEPARIN (PORCINE) IN NACL 100-0.45 UNIT/ML-% IJ SOLN
1050.0000 [IU]/h | INTRAMUSCULAR | Status: AC
Start: 2018-02-17 — End: 2018-02-18
  Administered 2018-02-17: 1050 [IU]/h via INTRAVENOUS

## 2018-02-17 MED ORDER — LISINOPRIL 5 MG PO TABS
2.5000 mg | ORAL_TABLET | Freq: Every day | ORAL | Status: DC
  Administered 2018-02-17 – 2018-02-21 (×5): 2.5 mg via ORAL
  Filled 2018-02-17 (×5): qty 1

## 2018-02-17 MED ORDER — ENSURE ENLIVE PO LIQD
237.0000 mL | Freq: Three times a day (TID) | ORAL | Status: DC
  Administered 2018-02-17 – 2018-02-21 (×7): 237 mL via ORAL

## 2018-02-17 MED ORDER — POTASSIUM CHLORIDE CRYS ER 20 MEQ PO TBCR
40.0000 meq | EXTENDED_RELEASE_TABLET | Freq: Once | ORAL | Status: AC
  Administered 2018-02-17: 40 meq via ORAL
  Filled 2018-02-17: qty 2

## 2018-02-17 MED ORDER — HEPARIN (PORCINE) IN NACL 100-0.45 UNIT/ML-% IJ SOLN
900.0000 [IU]/h | INTRAMUSCULAR | Status: DC
  Administered 2018-02-17: 900 [IU]/h via INTRAVENOUS
  Filled 2018-02-17: qty 250

## 2018-02-17 NOTE — Progress Notes (Signed)
PROGRESS NOTE    Lee Pratt  YKD:[id number] DOB: 17-Apr-1934 DOA: 02/14/2018 PCP: [name], MD   Brief Narrative:  Per HPI  Lee Pratt  is a 84 y.o. male, of [place] descent, with past medical history significant for gastric cancer on treatment , hypertension and hyperglycemia who was sent from the cancer center for evaluation of altered mental status, hypotension and tachycardia.  His PEG tube fell yesterday and had to be inserted and was in our emergency room.  The patient went to the [facility] today to have his Port-A-Cath removed when he started having the symptoms.  In the emergency room, the patient was noted to have ST elevations and had a temperature of 102.  Cardiology was consulted and they advised starting the patient on heparin drip.  The patient is DNR/DNI so no procedure was going to be done on him.  His blood pressure continued dropping and he was started on dopamine drip in addition to IV fluids.  Most of the history was taken in the presence of an interpreter.  Assessment & Plan:   Active Problems:   Acute anterior wall MI (HCC)   AMI (acute myocardial infarction) ([place])   Acute myocardial infarction (HCC)   Pressure injury of skin   Protein-calorie malnutrition, severe   STEMI: being treated medically.  Heparin gtt to stop 2/19 at 0600 (2/15-2/19).  Continue ASA and beta blocker.  Troponin downtrending.   Cardiology following, appreciate recs  CAP: Fluconazole has been d/c'd.  Continue vancomycin and zosyn (2/15 - ).  Seems to be improving from resp standpoint.  On room air.  Follow blood cx (NGTD) Sputum cx with rare klebsiella pneumonia (pending sensitivities)    CXR from today with pulm vascular congestion with pulm edema, felt to represent a degree of CHF.  Also probable loculated effusion along the R minor fissue and bilateral L>R effusions.   Repeat CXR tomorrow, consider thora SLP eval  HFrEF:  EF 35-40%.  On ace, bb.  CXR with some pulm edema, will ctm for now,  given relative stability from resp standpoint Lasix prn Follow 2 view CXR tomorrow  Gastric Adenocarcionma with peritoneal metastasis s/p gastrojejunostomy and PEG placement: s/p cycle 5 of folfox Dr. Burr Medico added to treatment team  Iron Deficiency Anemia: follow H/H  DVT prophylaxis: heparin gtt Code Status: DNR Family Communication: son at bedside Disposition Plan: pending   Consultants:   Cardiology  PCCM transfer  Procedures:  Study Conclusions  - Left ventricle: The cavity size was normal. There was mild   concentric hypertrophy. Systolic function was moderately reduced.   The estimated ejection fraction was in the range of 35% to 40%.   Anterior, anteroseptal, apical and inferoapical severe   hypokinesis to akinesis, suggestive of LAD territory   ischemia/infarct. Doppler parameters are consistent with   pseudonormal left ventricular relaxation (grade 2 diastolic   dysfunction). LV filling pressure is elevated. - Aortic valve: Mildly calcified with mild stenosis. There was mild   regurgitation. Valve area (Vmax): 1.67 cm^2. - Mitral valve: Mildly thickened leaflets . There was mild   regurgitation. - Left atrium: The atrium was normal in size. - Right ventricle: The cavity size was mildly dilated. Mild   hypokinesis. - Tricuspid valve: There was mild regurgitation. - Pulmonary arteries: PA peak pressure: 47 mm Hg (S). - Inferior vena cava: The vessel was dilated. The respirophasic   diameter changes were blunted (< 50%), consistent with elevated   central venous pressure.  Impressions:  -  Compared to a prior study in 2012, there has been a significant   reduction in LVEF to 35-40% with severe LAD territory wall motion   abnormality which is new, suggestive of ischemia or infarct -   there is moderate diastolic dysfunction with elevated LV filling   pressure.  Antimicrobials:  Anti-infectives (From admission, onward)   Start     Dose/Rate Route Frequency  Ordered Stop   02/15/18 2200  fluconazole (DIFLUCAN) IVPB 400 mg  Status:  Discontinued     400 mg 100 mL/hr over 120 Minutes Intravenous Every 24 hours 02/14/18 2159 02/14/18 2202   02/15/18 2200  fluconazole (DIFLUCAN) IVPB 200 mg  Status:  Discontinued     200 mg 100 mL/hr over 60 Minutes Intravenous Every 24 hours 02/14/18 2202 02/16/18 0841   02/15/18 1800  vancomycin (VANCOCIN) IVPB 750 mg/150 ml premix     750 mg 150 mL/hr over 60 Minutes Intravenous Every 24 hours 02/14/18 2116     02/14/18 2300  piperacillin-tazobactam (ZOSYN) IVPB 3.375 g     3.375 g 12.5 mL/hr over 240 Minutes Intravenous Every 8 hours 02/14/18 2116     02/14/18 2300  fluconazole (DIFLUCAN) IVPB 400 mg     400 mg 100 mL/hr over 120 Minutes Intravenous  Once 02/14/18 2202 02/15/18 0730   02/14/18 2200  fluconazole (DIFLUCAN) IVPB 800 mg  Status:  Discontinued     800 mg 100 mL/hr over 240 Minutes Intravenous  Once 02/14/18 2159 02/14/18 2202   02/14/18 1715  piperacillin-tazobactam (ZOSYN) IVPB 3.375 g     3.375 g 100 mL/hr over 30 Minutes Intravenous  Once 02/14/18 1714 02/14/18 1841   02/14/18 1715  vancomycin (VANCOCIN) IVPB 1000 mg/200 mL premix     1,000 mg 200 mL/hr over 60 Minutes Intravenous  Once 02/14/18 1714 02/14/18 2002       Subjective: No CP. No SOB. No other complaints. Interpreter on phone, available if questions, but pt declined to use.   Objective: Vitals:   02/17/18 0916 02/17/18 1000 02/17/18 1100 02/17/18 1200  BP:  129/73  111/69  Pulse:  91 89 79  Resp:  (!) 23 (!) 23 13  Temp:    98.5 F (36.9 C)  TempSrc:    Oral  SpO2:  94% 92% 95%  Weight: 58.4 kg (128 lb 12 oz)     Height:        Intake/Output Summary (Last 24 hours) at 02/17/2018 1441 Last data filed at 02/17/2018 1300 Gross per 24 hour  Intake 495.17 ml  Output 1015 ml  Net -519.83 ml   Filed Weights   02/14/18 1706 02/16/18 0312 02/17/18 0916  Weight: 56.2 kg (124 lb) 58.1 kg (128 lb 1.4 oz) 58.4 kg (128  lb 12 oz)    Examination:  General exam: Appears calm and comfortable  Respiratory system: Clear to auscultation. Respiratory effort normal. Cardiovascular system: S1 & S2 heard, RRR. No JVD, murmurs, rubs, gallops or clicks. No pedal edema. Gastrointestinal system: Abdomen is nondistended, soft and nontender. Central nervous system: Alert and oriented. No focal neurological deficits. Extremities: Symmetric 5 x 5 power. Skin: No rashes, lesions or ulcers Psychiatry: Judgement and insight appear normal. Mood & affect appropriate.     Data Reviewed: I have personally reviewed following labs and imaging studies  CBC: Recent Labs  Lab 02/12/18 1104 02/14/18 1712 02/15/18 0420 02/16/18 0357 02/17/18 0446  WBC 4.5 6.1 7.1 8.1 10.5  NEUTROABS 2.4 4.6  --   --   --  HGB  --  11.5* 10.0* 10.1* 10.0*  HCT 32.1* 34.6* 29.1* 29.8* 30.1*  MCV 91.4 92.8 90.1 90.6 91.8  PLT 252 395 260 212 160   Basic Metabolic Panel: Recent Labs  Lab 02/12/18 1101 02/14/18 1712 02/15/18 0420 02/16/18 0357 02/17/18 0446  NA 137 139 142 139 140  K 4.3 4.8 3.6 2.7* 3.6  CL 101 105 115* 110 109  CO2 27 16* 17* 20* 24  GLUCOSE 98 117* 108* 107* 123*  BUN 25 37* 30* 25* 35*  CREATININE 0.78 1.04 0.96 1.00 1.09  CALCIUM 8.7 8.6* 7.4* 7.7* 8.1*  MG  --   --   --  2.1 2.1  PHOS  --   --   --  2.5 1.9*   GFR: Estimated Creatinine Clearance: 43.2 mL/min (by C-G formula based on SCr of 1.09 mg/dL). Liver Function Tests: Recent Labs  Lab 02/12/18 1101 02/14/18 1712  AST 24 44*  ALT 26 31  ALKPHOS 118 94  BILITOT 0.4 0.6  PROT 6.8 6.8  ALBUMIN 2.7* 3.0*   No results for input(s): LIPASE, AMYLASE in the last 168 hours. No results for input(s): AMMONIA in the last 168 hours. Coagulation Profile: Recent Labs  Lab 02/14/18 1712  INR 1.10   Cardiac Enzymes: Recent Labs  Lab 02/15/18 0032 02/15/18 0352 02/15/18 1125 02/16/18 0357 02/17/18 0446  TROPONINI 12.65* 16.47* 21.08* 14.54*  7.00*   BNP (last 3 results) No results for input(s): PROBNP in the last 8760 hours. HbA1C: No results for input(s): HGBA1C in the last 72 hours. CBG: No results for input(s): GLUCAP in the last 168 hours. Lipid Profile: No results for input(s): CHOL, HDL, LDLCALC, TRIG, CHOLHDL, LDLDIRECT in the last 72 hours. Thyroid Function Tests: No results for input(s): TSH, T4TOTAL, FREET4, T3FREE, THYROIDAB in the last 72 hours. Anemia Panel: No results for input(s): VITAMINB12, FOLATE, FERRITIN, TIBC, IRON, RETICCTPCT in the last 72 hours. Sepsis Labs: Recent Labs  Lab 02/14/18 2018 02/14/18 2030 02/15/18 0032 02/15/18 0420 02/16/18 0357 02/16/18 0914 02/17/18 0446  PROCALCITON 5.29  --   --  17.60 11.65  --   --   LATICACIDVEN  --  3.74* 2.8*  --   --  1.1 1.3    Recent Results (from the past 240 hour(s))  Aerobic Culture (superficial specimen)     Status: None   Collection Time: 02/14/18  4:15 PM  Result Value Ref Range Status   Specimen Description   Final    WOUND LEFT ABDOMEN Performed at [facility], [place] [address] [place], [place] [address]    Special Requests   Final    NONE Performed at [facility], [place] [address] [place], [place] [address]    Gram Stain   Final    NO WBC SEEN ABUNDANT GRAM NEGATIVE RODS ABUNDANT GRAM POSITIVE RODS FEW GRAM POSITIVE COCCI IN PAIRS Performed at [facility], [place] [address], [place],  [address]    Culture   Final    ABUNDANT KLEBSIELLA PNEUMONIAE ABUNDANT SERRATIA MARCESCENS    Report Status 02/17/2018 FINAL  Final   Organism ID, Bacteria KLEBSIELLA PNEUMONIAE  Final   Organism ID, Bacteria SERRATIA MARCESCENS  Final      Susceptibility   Klebsiella pneumoniae - MIC*    AMPICILLIN >=32 RESISTANT Resistant     CEFAZOLIN <=4 SENSITIVE Sensitive     CEFEPIME <=1 SENSITIVE Sensitive     CEFTAZIDIME <=1 SENSITIVE Sensitive  CEFTRIAXONE <=1 SENSITIVE Sensitive     CIPROFLOXACIN  <=0.25 SENSITIVE Sensitive     GENTAMICIN <=1 SENSITIVE Sensitive     IMIPENEM <=0.25 SENSITIVE Sensitive     TRIMETH/SULFA <=20 SENSITIVE Sensitive     AMPICILLIN/SULBACTAM 4 SENSITIVE Sensitive     PIP/TAZO <=4 SENSITIVE Sensitive     Extended ESBL NEGATIVE Sensitive     * ABUNDANT KLEBSIELLA PNEUMONIAE   Serratia marcescens - MIC*    CEFAZOLIN >=64 RESISTANT Resistant     CEFEPIME <=1 SENSITIVE Sensitive     CEFTAZIDIME <=1 SENSITIVE Sensitive     CEFTRIAXONE <=1 SENSITIVE Sensitive     CIPROFLOXACIN <=0.25 SENSITIVE Sensitive     GENTAMICIN <=1 SENSITIVE Sensitive     TRIMETH/SULFA <=20 SENSITIVE Sensitive     * ABUNDANT SERRATIA MARCESCENS  Culture, blood (Routine x 2)     Status: None (Preliminary result)   Collection Time: 02/14/18  5:12 PM  Result Value Ref Range Status   Specimen Description   Final    BLOOD PORTA CATH Performed at [facility] [address] [place], [place] [address]    Special Requests   Final    BOTTLES DRAWN AEROBIC AND ANAEROBIC Blood Culture adequate volume Performed at [facility] [address], [place], [place] [address]    Culture   Final    NO GROWTH 3 DAYS Performed at [facility], [place] [address], [place], [place] [address]    Report Status PENDING  Incomplete  Culture, blood (Routine x 2)     Status: None (Preliminary result)   Collection Time: 02/14/18  5:12 PM  Result Value Ref Range Status   Specimen Description   Final    BLOOD LEFT WRIST Performed at [place] [address], [place], [place] [address]    Special Requests   Final    BOTTLES DRAWN AEROBIC AND ANAEROBIC Blood Culture adequate volume Performed at [facility] [address], [place], [place] [address]    Culture   Final    NO GROWTH 3 DAYS Performed at [facility], [place] [address], [place], [place] [address]    Report Status PENDING  Incomplete  Urine culture      Status: None   Collection Time: 02/14/18  5:12 PM  Result Value Ref Range Status   Specimen Description   Final    URINE, CATHETERIZED Performed at [facility] [address], [place], [place] [address]    Special Requests   Final    Immunocompromised Performed at [facility], [place] [address] [place], [place] [address]    Culture   Final    NO GROWTH Performed at [facility], [place] [address], [place], [place] [address]    Report Status 02/16/2018 FINAL  Final  C difficile quick scan w PCR reflex     Status: None   Collection Time: 02/14/18  6:40 PM  Result Value Ref Range Status   C Diff antigen NEGATIVE NEGATIVE Final   C Diff toxin NEGATIVE NEGATIVE Final   C Diff interpretation No C. difficile detected.  Final    Comment: Performed at [facility], [place] [address], [place], [place] [address]  Gastrointestinal Panel by PCR , Stool     Status: None   Collection Time: 02/14/18  6:40 PM  Result Value Ref Range Status   Campylobacter species NOT DETECTED NOT DETECTED Final  Plesimonas shigelloides NOT DETECTED NOT DETECTED Final   Salmonella species NOT DETECTED NOT DETECTED Final   Yersinia enterocolitica NOT DETECTED NOT DETECTED Final   Vibrio species NOT DETECTED NOT DETECTED Final   Vibrio cholerae NOT DETECTED NOT DETECTED Final   Enteroaggregative E coli (EAEC) NOT DETECTED NOT DETECTED Final   Enteropathogenic E coli (EPEC) NOT DETECTED NOT DETECTED Final   Enterotoxigenic E coli (ETEC) NOT DETECTED NOT DETECTED Final   Shiga like toxin producing E coli (STEC) NOT DETECTED NOT DETECTED Final   Shigella/Enteroinvasive E coli (EIEC) NOT DETECTED NOT DETECTED Final   Cryptosporidium NOT DETECTED NOT DETECTED Final   Cyclospora cayetanensis NOT DETECTED NOT DETECTED Final   Entamoeba histolytica NOT DETECTED NOT DETECTED Final   Giardia lamblia NOT DETECTED NOT DETECTED Final   Adenovirus F40/41 NOT DETECTED  NOT DETECTED Final   Astrovirus NOT DETECTED NOT DETECTED Final   Norovirus GI/GII NOT DETECTED NOT DETECTED Final   Rotavirus A NOT DETECTED NOT DETECTED Final   Sapovirus (I, II, IV, and V) NOT DETECTED NOT DETECTED Final    Comment: Performed at [facility], [place]., [place], [place] [address]  Respiratory Panel by PCR     Status: None   Collection Time: 02/15/18  4:20 AM  Result Value Ref Range Status   Adenovirus NOT DETECTED NOT DETECTED Final   Coronavirus 229E NOT DETECTED NOT DETECTED Final   Coronavirus HKU1 NOT DETECTED NOT DETECTED Final   Coronavirus NL63 NOT DETECTED NOT DETECTED Final   Coronavirus OC43 NOT DETECTED NOT DETECTED Final   Metapneumovirus NOT DETECTED NOT DETECTED Final   Rhinovirus / Enterovirus NOT DETECTED NOT DETECTED Final   Influenza A NOT DETECTED NOT DETECTED Final   Influenza B NOT DETECTED NOT DETECTED Final   Parainfluenza Virus 1 NOT DETECTED NOT DETECTED Final   Parainfluenza Virus 2 NOT DETECTED NOT DETECTED Final   Parainfluenza Virus 3 NOT DETECTED NOT DETECTED Final   Parainfluenza Virus 4 NOT DETECTED NOT DETECTED Final   Respiratory Syncytial Virus NOT DETECTED NOT DETECTED Final   Bordetella pertussis NOT DETECTED NOT DETECTED Final   Chlamydophila pneumoniae NOT DETECTED NOT DETECTED Final   Mycoplasma pneumoniae NOT DETECTED NOT DETECTED Final    Comment: Performed at [facility], [place] [address] [place], [place] [address]  MRSA PCR Screening     Status: None   Collection Time: 02/15/18 12:21 PM  Result Value Ref Range Status   MRSA by PCR NEGATIVE NEGATIVE Final    Comment:        The GeneXpert MRSA Assay (FDA approved for NASAL specimens only), is one component of a comprehensive MRSA colonization surveillance program. It is not intended to diagnose MRSA infection nor to guide or monitor treatment for MRSA infections. Performed at [facility], [place] [address] [place], [place]  [address]   Culture, expectorated sputum-assessment     Status: None   Collection Time: 02/15/18 11:47 PM  Result Value Ref Range Status   Specimen Description SPU  Final   Special Requests NONE  Final   Sputum evaluation   Final    THIS SPECIMEN IS ACCEPTABLE FOR SPUTUM CULTURE Performed at [facility], [place] [address], [place], [place] [address]    Report Status 02/16/2018 FINAL  Final  Culture, respiratory (NON-Expectorated)     Status: None (Preliminary result)   Collection Time: 02/15/18 11:47 PM  Result Value Ref Range Status   Specimen Description  Final    SPU Performed at [facility], [place] [address], [place], [place] [address]    Special Requests   Final    NONE Reflexed from [id number] Performed at [place] [address], [place], [place] [address]    Gram Stain   Final    MODERATE WBC PRESENT, PREDOMINANTLY PMN NO ORGANISMS SEEN    Culture   Final    RARE KLEBSIELLA PNEUMONIAE SUSCEPTIBILITIES TO FOLLOW Performed at [facility], [place] [address] [place], [place] [address]    Report Status PENDING  Incomplete         Radiology Studies: Dg Chest Port 1 View  Result Date: 02/17/2018 CLINICAL DATA:  Respiratory failure EXAM: PORTABLE CHEST 1 VIEW COMPARISON:  February 16, 2018 chest radiograph and chest CT February 10, 2018 FINDINGS: Port-A-Cath tip is in the superior vena cava. No pneumothorax. Ovoid opacity along the right minor fissure is stable and felt to represent loculated fluid. Elsewhere there is interstitial edema with pleural effusions bilaterally, left larger than right, stable. There is bibasilar atelectasis. There are scattered calcified granulomas on the right. There is a skin fold on the right. No pneumothorax appreciable. Heart size is upper normal with pulmonary vascularity showing evidence of pulmonary venous hypertension. There is aortic atherosclerosis. No adenopathy. No evident bone  lesions. IMPRESSION: Pulmonary vascular congestion with pulmonary edema, felt to represent a degree of congestive heart failure. Probable loculated effusion along the right minor fissure. Pleural effusions bilaterally, left larger than right, stable. No change in Port-A-Cath position. No pneumothorax. There is aortic atherosclerosis. Aortic Atherosclerosis (ICD10-I70.0). Electronically Signed   By: Lowella Grip III M.D.   On: 02/17/2018 07:05   Dg Chest Port 1 View  Result Date: 02/16/2018 CLINICAL DATA:  Acute respiratory failure. EXAM: PORTABLE CHEST 1 VIEW COMPARISON:  Radiographs 02/14/2018.  CT 02/10/2018 FINDINGS: Left chest port remains in place. Development of diffuse perihilar opacities and peribronchial thickening suspicious for pulmonary edema. Rounded density in the right mid lung corresponds to is likely fluid in the right minor fissure. Small bilateral pleural effusions are new. Heart size normal. Aortic atherosclerosis. Calcified densities in the right hemithorax correspond to pleural calcifications on CT. IMPRESSION: Development of CHF with moderate pulmonary edema and small pleural effusions. Electronically Signed   By: Jeb Levering M.D.   On: 02/16/2018 05:24        Scheduled Meds: . aspirin EC  81 mg Oral Daily  . atorvastatin  40 mg Oral q1800  . Chlorhexidine Gluconate Cloth  6 each Topical Q0600  . Chlorhexidine Gluconate Cloth  6 each Topical Daily  . clopidogrel  75 mg Oral Daily  . feeding supplement (ENSURE ENLIVE)  237 mL Oral TID BM  . lisinopril  2.5 mg Oral Daily  . metoprolol tartrate  25 mg Oral BID   Continuous Infusions: . sodium chloride 250 mL (02/17/18 1300)  . heparin 900 Units/hr (02/17/18 1300)  . norepinephrine (LEVOPHED) Adult infusion Stopped (02/15/18 1230)  . piperacillin-tazobactam (ZOSYN)  IV 3.375 g (02/17/18 1353)  . vancomycin Stopped (02/16/18 1917)     LOS: 3 days    Time spent: over 30 min    Fayrene Helper, MD [facility] Pager [phone]  If 7PM-7AM, please contact night-coverage [email or web address] Password [facility] 02/17/2018, 2:41 PM

## 2018-02-17 NOTE — Progress Notes (Signed)
[place] for IV heparin Indication: anterior STEMI  No Known Allergies  Patient Measurements: Height: 5\' 9"  (175.3 cm) Weight: 128 lb 12 oz (58.4 kg) IBW/kg (Calculated) : 70.7 Heparin Dosing Weight: total body weight  Vital Signs: Temp: 98.4 F (36.9 C) (02/18 1920) Temp Source: Axillary (02/18 1920) BP: 127/76 (02/18 1600) Pulse Rate: 91 (02/18 1600)  Labs: Recent Labs    02/15/18 0420 02/15/18 1125  02/16/18 0357 02/16/18 1400 02/17/18 0446 02/17/18 1805  HGB 10.0*  --   --  10.1*  --  10.0*  --   HCT 29.1*  --   --  29.8*  --  30.1*  --   PLT 260  --   --  212  --  244  --   HEPARINUNFRC  --   --    < > 0.25* 0.31 0.22* 0.21*  CREATININE 0.96  --   --  1.00  --  1.09  --   TROPONINI  --  21.08*  --  14.54*  --  7.00*  --    < > = values in this interval not displayed.    Estimated Creatinine Clearance: 43.2 mL/min (by C-G formula based on SCr of 1.09 mg/dL).   Medical History: Past Medical History:  Diagnosis Date  . Hyperglycemia 03/16/2013  . Hypertension   . Stomach cancer ([place])     Medications:  Medications Prior to Admission  Medication Sig Dispense Refill Last Dose  . aspirin EC 81 MG tablet Take 81 mg by mouth 3 (three) times a week.    02/14/2018 at Unknown time  . docusate sodium (COLACE) 100 MG capsule Take 1 capsule (100 mg total) by mouth 2 (two) times daily as needed for mild constipation. (Patient not taking: Reported on 02/13/2018) 30 capsule 1 Completed Course at Unknown time  . lidocaine-prilocaine (EMLA) cream Apply to affected area once (Patient not taking: Reported on 02/13/2018) 30 g 3 Completed Course at Unknown time  . ondansetron (ZOFRAN ODT) 8 MG disintegrating tablet Take 1 tablet (8 mg total) by mouth 2 (two) times daily as needed for nausea or vomiting. (Patient not taking: Reported on 02/13/2018) 20 tablet 1 Completed Course at Unknown time  . ondansetron (ZOFRAN) 8 MG tablet Take 1 tablet (8 mg  total) by mouth 2 (two) times daily as needed for refractory nausea / vomiting. Start on day 3 after chemotherapy. (Patient not taking: Reported on 02/13/2018) 30 tablet 1 Completed Course at Unknown time  . prochlorperazine (COMPAZINE) 10 MG tablet Take 1 tablet (10 mg total) by mouth every 6 (six) hours as needed (Nausea or vomiting). (Patient not taking: Reported on 02/13/2018) 30 tablet 1 Completed Course at Unknown time   Scheduled:  . aspirin EC  81 mg Oral Daily  . atorvastatin  40 mg Oral q1800  . Chlorhexidine Gluconate Cloth  6 each Topical Q0600  . Chlorhexidine Gluconate Cloth  6 each Topical Daily  . clopidogrel  75 mg Oral Daily  . feeding supplement (ENSURE ENLIVE)  237 mL Oral TID BM  . lisinopril  2.5 mg Oral Daily  . metoprolol tartrate  25 mg Oral BID   Infusions:  . sodium chloride 250 mL (02/17/18 1800)  . heparin    . norepinephrine (LEVOPHED) Adult infusion Stopped (02/15/18 1230)  . piperacillin-tazobactam (ZOSYN)  IV Stopped (02/17/18 1753)  . vancomycin 750 mg (02/17/18 1807)    Assessment: 89 yoM with PMH metastatic gastric cancer on chemo sent from  [facility] rigors, fever, and weakness. Found to be in sepsis and EKG also shows STEMI albeit without cardiac symptoms. Not a candidate for invasive procedures d/t multiple comorbidities. Cardiology is starting IV heparin per pharmacy along with ASA/Plavix.   Baseline INR wnl, aPTT not done  Prior anticoagulation: ASA 81 mg only  Significant events:  Today, 02/17/2018:  Heparin level remains subtherapeutic at 0.21 despite rate increase to 900 units/hr today   No disruptions in therapy nor issues with IV site per RN  No plan for heart catheterizaztion d/t co-morbidities  Goal of Therapy: Heparin level 0.3-0.7 units/ml Monitor platelets by anticoagulation protocol: Yes  Plan:  Increase heparin rate to 1050 units/hr  Heparin level in 8 hrs  Plan to stop heparin infusion on 2/19 at 0600  Daily  CBC  Monitor for signs of bleeding or thrombosis  Leone Haven, PharmD 02/17/2018 7:47 PM

## 2018-02-17 NOTE — Progress Notes (Signed)
Progress Note  Patient Name: Lee Pratt Date of Encounter: 02/17/2018  Primary Cardiologist: Sherren Mocha, MD   Subjective   No chest pain Discussed care with son Will who is a respiratory therapist in [facility]   Inpatient Medications    Scheduled Meds: . aspirin EC  81 mg Oral Daily  . atorvastatin  40 mg Oral q1800  . Chlorhexidine Gluconate Cloth  6 each Topical Q0600  . Chlorhexidine Gluconate Cloth  6 each Topical Daily  . clopidogrel  75 mg Oral Daily  . metoprolol tartrate  25 mg Oral BID  . potassium chloride  40 mEq Oral Once   Continuous Infusions: . sodium chloride 250 mL (02/17/18 0600)  . heparin 900 Units/hr (02/17/18 0915)  . norepinephrine (LEVOPHED) Adult infusion Stopped (02/15/18 1230)  . piperacillin-tazobactam (ZOSYN)  IV 3.375 g (02/17/18 0649)  . vancomycin Stopped (02/16/18 1917)   PRN Meds: sodium chloride, acetaminophen **OR** acetaminophen, HYDROcodone-acetaminophen, ondansetron **OR** ondansetron (ZOFRAN) IV, prochlorperazine, sodium chloride flush, zolpidem   Vital Signs    Vitals:   02/17/18 0600 02/17/18 0700 02/17/18 0800 02/17/18 0916  BP: 121/78 (!) 149/80    Pulse: 82 85    Resp: 18 (!) 23    Temp:   97.9 F (36.6 C)   TempSrc:   Oral   SpO2: 97% 98%    Weight:    128 lb 12 oz (58.4 kg)  Height:        Intake/Output Summary (Last 24 hours) at 02/17/2018 0932 Last data filed at 02/17/2018 3220 Gross per 24 hour  Intake 478 ml  Output 1240 ml  Net -762 ml   Filed Weights   02/14/18 1706 02/16/18 0312 02/17/18 0916  Weight: 124 lb (56.2 kg) 128 lb 1.4 oz (58.1 kg) 128 lb 12 oz (58.4 kg)    Telemetry    NSR no VT - Personally Reviewed  ECG    SR evolving anterior lateral MI ST elevation gone  - Personally Reviewed  Physical Exam  Chronically ill chinese male  GEN: No acute distress.   Neck: No JVD Cardiac: RRR, no murmurs, rubs, or gallops.  Respiratory: bilateral rhonchi auscultation bilaterally. GI: Soft,  nontender, non-distended  MS: No edema; No deformity. Neuro:  Nonfocal  Psych: Normal affect  Left subclavian port-a - cath   Labs    Chemistry Recent Labs  Lab 02/12/18 1101 02/14/18 1712 02/15/18 0420 02/16/18 0357 02/17/18 0446  NA 137 139 142 139 140  K 4.3 4.8 3.6 2.7* 3.6  CL 101 105 115* 110 109  CO2 27 16* 17* 20* 24  GLUCOSE 98 117* 108* 107* 123*  BUN 25 37* 30* 25* 35*  CREATININE 0.78 1.04 0.96 1.00 1.09  CALCIUM 8.7 8.6* 7.4* 7.7* 8.1*  PROT 6.8 6.8  --   --   --   ALBUMIN 2.7* 3.0*  --   --   --   AST 24 44*  --   --   --   ALT 26 31  --   --   --   ALKPHOS 118 94  --   --   --   BILITOT 0.4 0.6  --   --   --   GFRNONAA >60 >60 >60 >60 >60  GFRAA >60 >60 >60 >60 >60  ANIONGAP 9 18* 10 9 7      Hematology Recent Labs  Lab 02/15/18 0420 02/16/18 0357 02/17/18 0446  WBC 7.1 8.1 10.5  RBC 3.23* 3.29* 3.28*  HGB  10.0* 10.1* 10.0*  HCT 29.1* 29.8* 30.1*  MCV 90.1 90.6 91.8  MCH 31.0 30.7 30.5  MCHC 34.4 33.9 33.2  RDW 19.5* 19.3* 18.5*  PLT 260 212 244    Cardiac Enzymes Recent Labs  Lab 02/15/18 0352 02/15/18 1125 02/16/18 0357 02/17/18 0446  TROPONINI 16.47* 21.08* 14.54* 7.00*    Recent Labs  Lab 02/14/18 1729  TROPIPOC 0.04     BNPNo results for input(s): BNP, PROBNP in the last 168 hours.   DDimer No results for input(s): DDIMER in the last 168 hours.   Radiology    Dg Chest Port 1 View  Result Date: 02/17/2018 CLINICAL DATA:  Respiratory failure EXAM: PORTABLE CHEST 1 VIEW COMPARISON:  February 16, 2018 chest radiograph and chest CT February 10, 2018 FINDINGS: Port-A-Cath tip is in the superior vena cava. No pneumothorax. Ovoid opacity along the right minor fissure is stable and felt to represent loculated fluid. Elsewhere there is interstitial edema with pleural effusions bilaterally, left larger than right, stable. There is bibasilar atelectasis. There are scattered calcified granulomas on the right. There is a skin fold on the  right. No pneumothorax appreciable. Heart size is upper normal with pulmonary vascularity showing evidence of pulmonary venous hypertension. There is aortic atherosclerosis. No adenopathy. No evident bone lesions. IMPRESSION: Pulmonary vascular congestion with pulmonary edema, felt to represent a degree of congestive heart failure. Probable loculated effusion along the right minor fissure. Pleural effusions bilaterally, left larger than right, stable. No change in Port-A-Cath position. No pneumothorax. There is aortic atherosclerosis. Aortic Atherosclerosis (ICD10-I70.0). Electronically Signed   By: Lowella Grip III M.D.   On: 02/17/2018 07:05   Dg Chest Port 1 View  Result Date: 02/16/2018 CLINICAL DATA:  Acute respiratory failure. EXAM: PORTABLE CHEST 1 VIEW COMPARISON:  Radiographs 02/14/2018.  CT 02/10/2018 FINDINGS: Left chest port remains in place. Development of diffuse perihilar opacities and peribronchial thickening suspicious for pulmonary edema. Rounded density in the right mid lung corresponds to is likely fluid in the right minor fissure. Small bilateral pleural effusions are new. Heart size normal. Aortic atherosclerosis. Calcified densities in the right hemithorax correspond to pleural calcifications on CT. IMPRESSION: Development of CHF with moderate pulmonary edema and small pleural effusions. Electronically Signed   By: Jeb Levering M.D.   On: 02/16/2018 05:24    Cardiac Studies   Echo EF 30-35% no mechanical complications from MI   Patient Profile     84 y.o. male  With metastatic gastric cancer , HTN admitted with weakness , chills  Found to have ST elevation anterior MI. Not a cath candidate due to metastatic cancer  Assessment & Plan    1. No arrhythmia ST elevation resolved. Troponin 7 down from 21  Continue ASA and beta blocker  2. Ischemic DCM:  Add low dose ACE 3. Gastric Cancer:  DNR care per primary service    For questions or updates, please contact [facility] Please consult [email or web address] for contact info under Cardiology/STEMI.      Signed, Jenkins Rouge, MD  02/17/2018, 9:32 AM

## 2018-02-17 NOTE — Progress Notes (Addendum)
Initial Nutrition Assessment  DOCUMENTATION CODES:   Underweight, Severe malnutrition in context of chronic illness  INTERVENTION:    Ensure Enlive po TID, each supplement provides 350 kcal and 20 grams of protein  Magic cup TID with meals, each supplement provides 290 kcal and 9 grams of protein  Monitor magnesium, potassium, and phosphorus daily for at least 3 days, MD to replete as needed, as pt is at risk for refeeding syndrome.   NUTRITION DIAGNOSIS:   Severe Malnutrition related to chronic illness, cancer and cancer related treatments as evidenced by severe fat depletion, severe muscle depletion.  GOAL:   Patient will meet greater than or equal to 90% of their needs  MONITOR:   PO intake, Supplement acceptance, Weight trends, Labs  REASON FOR ASSESSMENT:   Malnutrition Screening Tool    ASSESSMENT:   Pt with PMH significant for gastric cancer receiving chemotherapy and HTN. Recently seen in the ER for fallen out PEG tube, which was re-inserted. On 2/15 pt was sent over from [facility] for evaluation of AMS, hypotension, and tachycardia. Admitted for anterior MI and bilateral PNA.    Spoke with pt and son at bedside. Since 1/31 pt was receiving J tube feedings at Osmolite 1.5 @ 50 ml/hr x 16 hours (1200 kcal and 50 g protein) per day and he was taking some baby food. J tube became dislodged 02/10/18 and he began using G tube for feedings. Pt continued to leak around J tube site. J tube was removed due to site with drainage/infection, G tube noted to be clamped. Pt states he hasn't received any form of nutrition since Friday 2/15. Pt's PO intake was minimal at home. Denies any swallowing issues or taste changes. No plans for tube feeding at this time. Will provide supplementation for comfort.   Pt endorses a UBW of 140 lb, th last time being at that weight in September. Records indicate pt weighed 137 lb 11/08/17 and 128 lb this admission (6.5% wt loss in 3 months,  insignificant for time frame). Nutrition-Focused physical exam completed.   Palliative consulted for goals of care.   Medications reviewed and include: IV abx Labs reviewed: Phosphorus 1.9 (L)  NUTRITION - FOCUSED PHYSICAL EXAM:    Most Recent Value  Orbital Region  Moderate depletion  Upper Arm Region  Severe depletion  Thoracic and Lumbar Region  Severe depletion  Buccal Region  Severe depletion  Temple Region  Severe depletion  Clavicle Bone Region  Severe depletion  Clavicle and Acromion Bone Region  Severe depletion  Scapular Bone Region  Severe depletion  Dorsal Hand  Severe depletion  Patellar Region  Severe depletion  Anterior Thigh Region  Severe depletion  Posterior Calf Region  Severe depletion  Edema (RD Assessment)  None  Hair  Reviewed  Eyes  Reviewed  Mouth  Reviewed  Skin  Reviewed  Nails  Reviewed     Diet Order:  Diet Heart Room service appropriate? Yes; Fluid consistency: Thin  EDUCATION NEEDS:   Education needs have been addressed  Skin:  Skin Assessment: Skin Integrity Issues: Skin Integrity Issues:: Stage II Stage II: sacrum  Last BM:  02/15/18  Height:   Ht Readings from Last 1 Encounters:  02/14/18 5\' 9"  (1.753 m)    Weight:   Wt Readings from Last 1 Encounters:  02/17/18 128 lb 12 oz (58.4 kg)    Ideal Body Weight:  72.7 kg  BMI:  Body mass index is 19.01 kg/m.  Estimated Nutritional Needs:  Kcal:  2000-2200 kcal/day  Protein:  75-90 g/day  Fluid:  >2.2 L/day     Mariana Single RD, LDN Clinical Nutrition Pager # - [phone]

## 2018-02-17 NOTE — Progress Notes (Signed)
[facility] for IV heparin Indication: anterior STEMI  No Known Allergies  Patient Measurements: Height: 5\' 9"  (175.3 cm) Weight: 128 lb 1.4 oz (58.1 kg) IBW/kg (Calculated) : 70.7 Heparin Dosing Weight: 56kg  Vital Signs: Temp: 97.9 F (36.6 C) (02/18 0800) Temp Source: Oral (02/18 0800) BP: 149/80 (02/18 0700) Pulse Rate: 85 (02/18 0700)  Labs: Recent Labs    02/14/18 1712  02/15/18 0420 02/15/18 1125  02/16/18 0357 02/16/18 1400 02/17/18 0446  HGB 11.5*  --  10.0*  --   --  10.1*  --  10.0*  HCT 34.6*  --  29.1*  --   --  29.8*  --  30.1*  PLT 395  --  260  --   --  212  --  244  LABPROT 14.1  --   --   --   --   --   --   --   INR 1.10  --   --   --   --   --   --   --   HEPARINUNFRC  --    < >  --   --    < > 0.25* 0.31 0.22*  CREATININE 1.04  --  0.96  --   --  1.00  --  1.09  TROPONINI  --    < >  --  21.08*  --  14.54*  --  7.00*   < > = values in this interval not displayed.    Estimated Creatinine Clearance: 42.9 mL/min (by C-G formula based on SCr of 1.09 mg/dL).   Medical History: Past Medical History:  Diagnosis Date  . Hyperglycemia 03/16/2013  . Hypertension   . Stomach cancer ([place])     Medications:  Medications Prior to Admission  Medication Sig Dispense Refill Last Dose  . aspirin EC 81 MG tablet Take 81 mg by mouth 3 (three) times a week.    02/14/2018 at Unknown time  . docusate sodium (COLACE) 100 MG capsule Take 1 capsule (100 mg total) by mouth 2 (two) times daily as needed for mild constipation. (Patient not taking: Reported on 02/13/2018) 30 capsule 1 Completed Course at Unknown time  . lidocaine-prilocaine (EMLA) cream Apply to affected area once (Patient not taking: Reported on 02/13/2018) 30 g 3 Completed Course at Unknown time  . ondansetron (ZOFRAN ODT) 8 MG disintegrating tablet Take 1 tablet (8 mg total) by mouth 2 (two) times daily as needed for nausea or vomiting. (Patient not taking: Reported on  02/13/2018) 20 tablet 1 Completed Course at Unknown time  . ondansetron (ZOFRAN) 8 MG tablet Take 1 tablet (8 mg total) by mouth 2 (two) times daily as needed for refractory nausea / vomiting. Start on day 3 after chemotherapy. (Patient not taking: Reported on 02/13/2018) 30 tablet 1 Completed Course at Unknown time  . prochlorperazine (COMPAZINE) 10 MG tablet Take 1 tablet (10 mg total) by mouth every 6 (six) hours as needed (Nausea or vomiting). (Patient not taking: Reported on 02/13/2018) 30 tablet 1 Completed Course at Unknown time   Scheduled:  . aspirin EC  81 mg Oral Daily  . atorvastatin  40 mg Oral q1800  . Chlorhexidine Gluconate Cloth  6 each Topical Q0600  . Chlorhexidine Gluconate Cloth  6 each Topical Daily  . clopidogrel  75 mg Oral Daily  . metoprolol tartrate  25 mg Oral BID  . potassium chloride  40 mEq Oral Once   Infusions:  . sodium  chloride 250 mL (02/17/18 0600)  . heparin 800 Units/hr (02/17/18 0600)  . norepinephrine (LEVOPHED) Adult infusion Stopped (02/15/18 1230)  . piperacillin-tazobactam (ZOSYN)  IV 3.375 g (02/17/18 0649)  . vancomycin Stopped (02/16/18 1917)    Assessment: 77 yoM with PMH metastatic gastric cancer on chemo sent from [facility] rigors, fever, and weakness. Found to be in sepsis and EKG also shows STEMI albeit without cardiac symptoms. Not a candidate for invasive procedures d/t multiple comorbidities. Cardiology is starting IV heparin per pharmacy along with ASA/Plavix.   Baseline INR wnl, aPTT not done  Prior anticoagulation: ASA 81 mg only  Significant events:  Today, 02/17/2018:  Heparin level subtherapeutic at 0.22 now on 800 units/hr  No plan for heart catheterizaztion d/t co-morbidities  Troponin starting to trend down now  CBC: Hgb low, likely d/t chemo; Plt wnl. Both are stable  Per nursing no bleeding or line issues   Goal of Therapy: Heparin level 0.3-0.7 units/ml Monitor platelets by anticoagulation protocol:  Yes  Plan:  Increase heparin rate to 900 units/hr  Heparin level in 8 hrs  Spoke to Dr. Johnsie Cancel who ok'ed the infusion to stop on 2/19 at 0600  Daily CBC  Monitor for signs of bleeding or thrombosis  Royetta Asal, PharmD, BCPS Pager [phone] 02/17/2018 9:14 AM

## 2018-02-17 NOTE — Progress Notes (Signed)
Lee Pratt   DOB:[birth year]   DT#:[id number]   DXI#:[id number]  Oncology f/u   Subjective: Pt is well-known to me, under my care for his metastatic gastric cancer. He was admitted from my office 3 days ago for fever and chill. He was admitted to ICU. He feels better overall today, afebrile, vital signs stable. He denies pain.    Objective:  Vitals:   02/17/18 1920 02/17/18 2000  BP:  129/71  Pulse:  86  Resp:  13  Temp: 98.4 F (36.9 C)   SpO2:  96%    Body mass index is 19.01 kg/m.  Intake/Output Summary (Last 24 hours) at 02/17/2018 2246 Last data filed at 02/17/2018 1800 Gross per 24 hour  Intake 621.17 ml  Output 815 ml  Net -193.83 ml     Sclerae unicteric  Oropharynx clear  No peripheral adenopathy  Lungs clear -- no rales or rhonchi  Heart regular rate and rhythm  Abdomen benign, soft, (+) G-tube in place   MSK no focal spinal tenderness, no peripheral edema  Neuro nonfocal   CBG (last 3)  No results for input(s): GLUCAP in the last 72 hours.   Labs:  Lab Results  Component Value Date   WBC 10.5 02/17/2018   HGB 10.0 (L) 02/17/2018   HCT 30.1 (L) 02/17/2018   MCV 91.8 02/17/2018   PLT 244 02/17/2018   NEUTROABS 4.6 02/14/2018    CMP Latest Ref Rng & Units 02/17/2018 02/16/2018 02/15/2018  Glucose 65 - 99 mg/dL 123(H) 107(H) 108(H)  BUN 6 - 20 mg/dL 35(H) 25(H) 30(H)  Creatinine 0.61 - 1.24 mg/dL 1.09 1.00 0.96  Sodium 135 - 145 mmol/L 140 139 142  Potassium 3.5 - 5.1 mmol/L 3.6 2.7(LL) 3.6  Chloride 101 - 111 mmol/L 109 110 115(H)  CO2 22 - 32 mmol/L 24 20(L) 17(L)  Calcium 8.9 - 10.3 mg/dL 8.1(L) 7.7(L) 7.4(L)  Total Protein 6.5 - 8.1 g/dL - - -  Total Bilirubin 0.3 - 1.2 mg/dL - - -  Alkaline Phos 38 - 126 U/L - - -  AST 15 - 41 U/L - - -  ALT 17 - 63 U/L - - -     Urine Studies No results for input(s): UHGB, CRYS in the last 72 hours.  Invalid input(s): UACOL, UAPR, USPG, UPH, UTP, UGL, UKET, UBIL, UNIT, UROB, [place], UEPI, UWBC, Junie Panning [place],  [place], [place]  Basic Metabolic Panel: Recent Labs  Lab 02/12/18 1101 02/14/18 1712 02/15/18 0420 02/16/18 0357 02/17/18 0446  NA 137 139 142 139 140  K 4.3 4.8 3.6 2.7* 3.6  CL 101 105 115* 110 109  CO2 27 16* 17* 20* 24  GLUCOSE 98 117* 108* 107* 123*  BUN 25 37* 30* 25* 35*  CREATININE 0.78 1.04 0.96 1.00 1.09  CALCIUM 8.7 8.6* 7.4* 7.7* 8.1*  MG  --   --   --  2.1 2.1  PHOS  --   --   --  2.5 1.9*   GFR Estimated Creatinine Clearance: 43.2 mL/min (by C-G formula based on SCr of 1.09 mg/dL). Liver Function Tests: Recent Labs  Lab 02/12/18 1101 02/14/18 1712  AST 24 44*  ALT 26 31  ALKPHOS 118 94  BILITOT 0.4 0.6  PROT 6.8 6.8  ALBUMIN 2.7* 3.0*   No results for input(s): LIPASE, AMYLASE in the last 168 hours. No results for input(s): AMMONIA in the last 168 hours. Coagulation profile Recent Labs  Lab 02/14/18 1712  INR 1.10  CBC: Recent Labs  Lab 02/12/18 1104 02/14/18 1712 02/15/18 0420 02/16/18 0357 02/17/18 0446  WBC 4.5 6.1 7.1 8.1 10.5  NEUTROABS 2.4 4.6  --   --   --   HGB  --  11.5* 10.0* 10.1* 10.0*  HCT 32.1* 34.6* 29.1* 29.8* 30.1*  MCV 91.4 92.8 90.1 90.6 91.8  PLT 252 395 260 212 244   Cardiac Enzymes: Recent Labs  Lab 02/15/18 0032 02/15/18 0352 02/15/18 1125 02/16/18 0357 02/17/18 0446  TROPONINI 12.65* 16.47* 21.08* 14.54* 7.00*   BNP: Invalid input(s): POCBNP CBG: No results for input(s): GLUCAP in the last 168 hours. D-Dimer No results for input(s): DDIMER in the last 72 hours. Hgb A1c No results for input(s): HGBA1C in the last 72 hours. Lipid Profile No results for input(s): CHOL, HDL, LDLCALC, TRIG, CHOLHDL, LDLDIRECT in the last 72 hours. Thyroid function studies No results for input(s): TSH, T4TOTAL, T3FREE, THYROIDAB in the last 72 hours.  Invalid input(s): FREET3 Anemia work up No results for input(s): VITAMINB12, FOLATE, FERRITIN, TIBC, IRON, RETICCTPCT in the last 72 hours. Microbiology Recent Results  (from the past 240 hour(s))  Aerobic Culture (superficial specimen)     Status: None   Collection Time: 02/14/18  4:15 PM  Result Value Ref Range Status   Specimen Description   Final    WOUND LEFT ABDOMEN Performed at [facility], [address] [place], [place] [address]    Special Requests   Final    NONE Performed at [facility], [place] [address] [place], [place] [address]    Gram Stain   Final    NO WBC SEEN ABUNDANT GRAM NEGATIVE RODS ABUNDANT GRAM POSITIVE RODS FEW GRAM POSITIVE COCCI IN PAIRS Performed at [facility], [place] [address], [place], [place] [address]    Culture   Final    ABUNDANT KLEBSIELLA PNEUMONIAE ABUNDANT SERRATIA MARCESCENS    Report Status 02/17/2018 FINAL  Final   Organism ID, Bacteria KLEBSIELLA PNEUMONIAE  Final   Organism ID, Bacteria SERRATIA MARCESCENS  Final      Susceptibility   Klebsiella pneumoniae - MIC*    AMPICILLIN >=32 RESISTANT Resistant     CEFAZOLIN <=4 SENSITIVE Sensitive     CEFEPIME <=1 SENSITIVE Sensitive     CEFTAZIDIME <=1 SENSITIVE Sensitive     CEFTRIAXONE <=1 SENSITIVE Sensitive     CIPROFLOXACIN <=0.25 SENSITIVE Sensitive     GENTAMICIN <=1 SENSITIVE Sensitive     IMIPENEM <=0.25 SENSITIVE Sensitive     TRIMETH/SULFA <=20 SENSITIVE Sensitive     AMPICILLIN/SULBACTAM 4 SENSITIVE Sensitive     PIP/TAZO <=4 SENSITIVE Sensitive     Extended ESBL NEGATIVE Sensitive     * ABUNDANT KLEBSIELLA PNEUMONIAE   Serratia marcescens - MIC*    CEFAZOLIN >=64 RESISTANT Resistant     CEFEPIME <=1 SENSITIVE Sensitive     CEFTAZIDIME <=1 SENSITIVE Sensitive     CEFTRIAXONE <=1 SENSITIVE Sensitive     CIPROFLOXACIN <=0.25 SENSITIVE Sensitive     GENTAMICIN <=1 SENSITIVE Sensitive     TRIMETH/SULFA <=20 SENSITIVE Sensitive     * ABUNDANT SERRATIA MARCESCENS  Culture, blood (Routine x 2)     Status: None (Preliminary result)   Collection Time: 02/14/18  5:12 PM  Result Value Ref Range Status    Specimen Description   Final    BLOOD PORTA CATH Performed at [facility], [place] [address], [place], [place] [address]    Special Requests  Final    BOTTLES DRAWN AEROBIC AND ANAEROBIC Blood Culture adequate volume Performed at [place] [address] [place] [address] [place], [place] [address]    Culture   Final    NO GROWTH 3 DAYS Performed at [facility], [place] [address] [place], [place] [address]    Report Status PENDING  Incomplete  Culture, blood (Routine x 2)     Status: None (Preliminary result)   Collection Time: 02/14/18  5:12 PM  Result Value Ref Range Status   Specimen Description   Final    BLOOD LEFT WRIST Performed at [place] [address] [place], [place]  [address]    Special Requests   Final    BOTTLES DRAWN AEROBIC AND ANAEROBIC Blood Culture adequate volume Performed at [place] [address], [place], [place] [address]    Culture   Final    NO GROWTH 3 DAYS Performed at [facility], [place] [address], [place], [place] [address]    Report Status PENDING  Incomplete  Urine culture     Status: None   Collection Time: 02/14/18  5:12 PM  Result Value Ref Range Status   Specimen Description   Final    URINE, CATHETERIZED Performed at [place] [address] [place], [place] [address]    Special Requests   Final    Immunocompromised Performed at [facility], [place] [address] [place], [place] [address]    Culture   Final    NO GROWTH Performed at [facility], [place] [address] [place], [place] [address]    Report Status 02/16/2018 FINAL  Final  C difficile quick scan w PCR reflex     Status: None   Collection Time: 02/14/18  6:40 PM  Result Value Ref Range Status   C Diff antigen NEGATIVE NEGATIVE Final   C Diff toxin NEGATIVE NEGATIVE Final   C Diff interpretation No C. difficile detected.  Final    Comment:  Performed at [facility], [place] [address] [place], [place] [address]  Gastrointestinal Panel by PCR , Stool     Status: None   Collection Time: 02/14/18  6:40 PM  Result Value Ref Range Status   Campylobacter species NOT DETECTED NOT DETECTED Final   Plesimonas shigelloides NOT DETECTED NOT DETECTED Final   Salmonella species NOT DETECTED NOT DETECTED Final   Yersinia enterocolitica NOT DETECTED NOT DETECTED Final   Vibrio species NOT DETECTED NOT DETECTED Final   Vibrio cholerae NOT DETECTED NOT DETECTED Final   Enteroaggregative E coli (EAEC) NOT DETECTED NOT DETECTED Final   Enteropathogenic E coli (EPEC) NOT DETECTED NOT DETECTED Final   Enterotoxigenic E coli (ETEC) NOT DETECTED NOT DETECTED Final   Shiga like toxin producing E coli (STEC) NOT DETECTED NOT DETECTED Final   Shigella/Enteroinvasive E coli (EIEC) NOT DETECTED NOT DETECTED Final   Cryptosporidium NOT DETECTED NOT DETECTED Final   Cyclospora cayetanensis NOT DETECTED NOT DETECTED Final   Entamoeba histolytica NOT DETECTED NOT DETECTED Final   Giardia lamblia NOT DETECTED NOT DETECTED Final   Adenovirus F40/41 NOT DETECTED NOT DETECTED Final   Astrovirus NOT DETECTED NOT DETECTED Final   Norovirus GI/GII NOT DETECTED NOT DETECTED Final   Rotavirus A NOT DETECTED NOT DETECTED Final   Sapovirus (I, II, IV, and V) NOT DETECTED NOT DETECTED Final    Comment: Performed at [facility], [place], [place]  [address]  Respiratory Panel by PCR     Status: None   Collection Time: 02/15/18  4:20 AM  Result Value Ref Range Status   Adenovirus NOT DETECTED NOT DETECTED Final   Coronavirus 229E NOT DETECTED NOT DETECTED Final   Coronavirus HKU1 NOT DETECTED NOT DETECTED Final   Coronavirus NL63 NOT DETECTED NOT DETECTED Final   Coronavirus OC43 NOT DETECTED NOT DETECTED Final   Metapneumovirus NOT DETECTED NOT DETECTED Final   Rhinovirus / Enterovirus NOT DETECTED NOT DETECTED Final    Influenza A NOT DETECTED NOT DETECTED Final   Influenza B NOT DETECTED NOT DETECTED Final   Parainfluenza Virus 1 NOT DETECTED NOT DETECTED Final   Parainfluenza Virus 2 NOT DETECTED NOT DETECTED Final   Parainfluenza Virus 3 NOT DETECTED NOT DETECTED Final   Parainfluenza Virus 4 NOT DETECTED NOT DETECTED Final   Respiratory Syncytial Virus NOT DETECTED NOT DETECTED Final   Bordetella pertussis NOT DETECTED NOT DETECTED Final   Chlamydophila pneumoniae NOT DETECTED NOT DETECTED Final   Mycoplasma pneumoniae NOT DETECTED NOT DETECTED Final    Comment: Performed at [facility], [place] [address], [place], [place] [address]  MRSA PCR Screening     Status: None   Collection Time: 02/15/18 12:21 PM  Result Value Ref Range Status   MRSA by PCR NEGATIVE NEGATIVE Final    Comment:        The GeneXpert MRSA Assay (FDA approved for NASAL specimens only), is one component of a comprehensive MRSA colonization surveillance program. It is not intended to diagnose MRSA infection nor to guide or monitor treatment for MRSA infections. Performed at [facility], [place] [address], [place], [place] [address]   Culture, expectorated sputum-assessment     Status: None   Collection Time: 02/15/18 11:47 PM  Result Value Ref Range Status   Specimen Description SPU  Final   Special Requests NONE  Final   Sputum evaluation   Final    THIS SPECIMEN IS ACCEPTABLE FOR SPUTUM CULTURE Performed at [facility], [place] [address], [place], [place] [address]    Report Status 02/16/2018 FINAL  Final  Culture, respiratory (NON-Expectorated)     Status: None (Preliminary result)   Collection Time: 02/15/18 11:47 PM  Result Value Ref Range Status   Specimen Description   Final    SPU Performed at [place] [address], [place], [place] [address]    Special Requests   Final    NONE Reflexed from [id number] Performed at [place] [address] [place], [place] [address]    Gram Stain   Final    MODERATE WBC PRESENT, PREDOMINANTLY PMN NO ORGANISMS SEEN    Culture   Final    RARE KLEBSIELLA PNEUMONIAE SUSCEPTIBILITIES TO FOLLOW Performed at [facility], [place] [address], [place],  [address]    Report Status PENDING  Incomplete      Studies:  Dg Chest Port 1 View  Result Date: 02/17/2018 CLINICAL DATA:  Respiratory failure EXAM: PORTABLE CHEST 1 VIEW COMPARISON:  February 16, 2018 chest radiograph and chest CT February 10, 2018 FINDINGS: Port-A-Cath tip is in the superior vena cava. No pneumothorax. Ovoid opacity along the right minor fissure is stable and felt to represent loculated fluid. Elsewhere there is interstitial edema with pleural effusions bilaterally, left larger than right, stable. There is bibasilar atelectasis. There are scattered calcified granulomas on the right. There is a skin fold  on the right. No pneumothorax appreciable. Heart size is upper normal with pulmonary vascularity showing evidence of pulmonary venous hypertension. There is aortic atherosclerosis. No adenopathy. No evident bone lesions. IMPRESSION: Pulmonary vascular congestion with pulmonary edema, felt to represent a degree of congestive heart failure. Probable loculated effusion along the right minor fissure. Pleural effusions bilaterally, left larger than right, stable. No change in Port-A-Cath position. No pneumothorax. There is aortic atherosclerosis. Aortic Atherosclerosis (ICD10-I70.0). Electronically Signed   By: Lowella Grip III M.D.   On: 02/17/2018 07:05   Dg Chest Port 1 View  Result Date: 02/16/2018 CLINICAL DATA:  Acute respiratory failure. EXAM: PORTABLE CHEST 1 VIEW COMPARISON:  Radiographs 02/14/2018.  CT 02/10/2018 FINDINGS: Left chest port remains in place. Development of diffuse perihilar opacities and peribronchial thickening suspicious for pulmonary edema. Rounded density in the right mid lung  corresponds to is likely fluid in the right minor fissure. Small bilateral pleural effusions are new. Heart size normal. Aortic atherosclerosis. Calcified densities in the right hemithorax correspond to pleural calcifications on CT. IMPRESSION: Development of CHF with moderate pulmonary edema and small pleural effusions. Electronically Signed   By: Jeb Levering M.D.   On: 02/16/2018 05:24    Assessment: 84 y.o.  1. AMI with CHF 2. Sepsis and CAP 3. Wound infection at J-tube site 4. Gastric adenocarcinoma with peritoneal metastasis, on palliative chemo   Plan:  -he is on medical treatment for AMI, not felt to be a candidate for angioplasty  -on broad antibiotics for pneumonia and wound infection  -He has been on chemo FOLFOX for his cancer, which can cause cardiac ischemia. I am not able to rule out 5-fu induced heart attack  -will hold chemo for now (for at least a month). I discussed with pt, his wife and son Will  -unless there is other contraindication, please start him on tube feeding through G-tube, and oral liquid diet. Please consult dietician  -I appreciate the excellent care from hospitalist, ICU and cardiology service  -I will f/u as needed  [name]  02/17/2018     [name], MD 02/17/2018  10:46 PM

## 2018-02-18 ENCOUNTER — Inpatient Hospital Stay (HOSPITAL_COMMUNITY): Payer: Medicare Other

## 2018-02-18 LAB — CBC
HCT: 28.9 % — ABNORMAL LOW (ref 39.0–52.0)
Hemoglobin: 9.7 g/dL — ABNORMAL LOW (ref 13.0–17.0)
MCH: 30.9 pg (ref 26.0–34.0)
MCHC: 33.6 g/dL (ref 30.0–36.0)
MCV: 92 fL (ref 78.0–100.0)
PLATELETS: 227 10*3/uL (ref 150–400)
RBC: 3.14 MIL/uL — AB (ref 4.22–5.81)
RDW: 18.3 % — AB (ref 11.5–15.5)
WBC: 9.2 10*3/uL (ref 4.0–10.5)

## 2018-02-18 LAB — CULTURE, RESPIRATORY

## 2018-02-18 LAB — COMPREHENSIVE METABOLIC PANEL
ALT: 25 U/L (ref 17–63)
ANION GAP: 10 (ref 5–15)
AST: 40 U/L (ref 15–41)
Albumin: 2.2 g/dL — ABNORMAL LOW (ref 3.5–5.0)
Alkaline Phosphatase: 55 U/L (ref 38–126)
BUN: 34 mg/dL — ABNORMAL HIGH (ref 6–20)
CHLORIDE: 107 mmol/L (ref 101–111)
CO2: 22 mmol/L (ref 22–32)
Calcium: 7.8 mg/dL — ABNORMAL LOW (ref 8.9–10.3)
Creatinine, Ser: 0.93 mg/dL (ref 0.61–1.24)
Glucose, Bld: 113 mg/dL — ABNORMAL HIGH (ref 65–99)
POTASSIUM: 3.4 mmol/L — AB (ref 3.5–5.1)
Sodium: 139 mmol/L (ref 135–145)
Total Bilirubin: 1 mg/dL (ref 0.3–1.2)
Total Protein: 5.4 g/dL — ABNORMAL LOW (ref 6.5–8.1)

## 2018-02-18 LAB — GLUCOSE, CAPILLARY
Glucose-Capillary: 135 mg/dL — ABNORMAL HIGH (ref 65–99)
Glucose-Capillary: 136 mg/dL — ABNORMAL HIGH (ref 65–99)
Glucose-Capillary: 135 mg/dL — ABNORMAL HIGH (ref 65–99)

## 2018-02-18 LAB — CULTURE, RESPIRATORY W GRAM STAIN

## 2018-02-18 LAB — HEPARIN LEVEL (UNFRACTIONATED): HEPARIN UNFRACTIONATED: 0.39 [IU]/mL (ref 0.30–0.70)

## 2018-02-18 LAB — PROCALCITONIN: PROCALCITONIN: 4.39 ng/mL

## 2018-02-18 LAB — MAGNESIUM: MAGNESIUM: 2 mg/dL (ref 1.7–2.4)

## 2018-02-18 MED ORDER — JEVITY 1.2 CAL PO LIQD: 1000.0000 mL | ORAL | Status: DC

## 2018-02-18 MED ORDER — OSMOLITE 1.5 CAL PO LIQD
1000.0000 mL | ORAL | Status: DC
  Administered 2018-02-18 – 2018-02-21 (×3): 1000 mL
  Filled 2018-02-18 (×7): qty 1000

## 2018-02-18 MED ORDER — ENOXAPARIN SODIUM 40 MG/0.4ML ~~LOC~~ SOLN
40.0000 mg | SUBCUTANEOUS | Status: DC
Start: 2018-02-18 — End: 2018-02-21
  Administered 2018-02-18 – 2018-02-21 (×4): 40 mg via SUBCUTANEOUS
  Filled 2018-02-18 (×4): qty 0.4

## 2018-02-18 MED ORDER — METOPROLOL TARTRATE 25 MG PO TABS
37.5000 mg | ORAL_TABLET | Freq: Two times a day (BID) | ORAL | Status: DC
  Administered 2018-02-18 – 2018-02-21 (×6): 37.5 mg via ORAL
  Filled 2018-02-18 (×6): qty 2

## 2018-02-18 MED ORDER — ZINC OXIDE 12.8 % EX OINT
TOPICAL_OINTMENT | Freq: Four times a day (QID) | CUTANEOUS | Status: DC
  Administered 2018-02-18: 17:00:00 via TOPICAL
  Administered 2018-02-18: 1 via TOPICAL
  Administered 2018-02-18 – 2018-02-21 (×9): via TOPICAL
  Filled 2018-02-18 (×2): qty 56.7

## 2018-02-18 MED ORDER — LIDOCAINE HCL (PF) 2 % IJ SOLN
0.0000 mL | Freq: Once | INTRAMUSCULAR | Status: DC | PRN
  Filled 2018-02-18: qty 20

## 2018-02-18 MED ORDER — OSMOLITE 1.5 CAL PO LIQD
1000.0000 mL | ORAL | Status: DC
  Administered 2018-02-18: 1000 mL
  Filled 2018-02-18: qty 1000

## 2018-02-18 MED ORDER — FUROSEMIDE 10 MG/ML IJ SOLN
20.0000 mg | Freq: Once | INTRAMUSCULAR | Status: AC
  Administered 2018-02-18: 20 mg via INTRAVENOUS
  Filled 2018-02-18: qty 2

## 2018-02-18 MED ORDER — POTASSIUM CHLORIDE CRYS ER 20 MEQ PO TBCR
40.0000 meq | EXTENDED_RELEASE_TABLET | Freq: Once | ORAL | Status: AC
  Administered 2018-02-18: 40 meq via ORAL
  Filled 2018-02-18: qty 2

## 2018-02-18 NOTE — Evaluation (Signed)
Clinical/Bedside Swallow Evaluation Patient Details  Name: Lee Pratt MRN: [id number] Date of Birth: 09/27/1934  Today's Date: 02/18/2018 Time: SLP Start Time (ACUTE ONLY): 1502 SLP Stop Time (ACUTE ONLY): 1512 SLP Time Calculation (min) (ACUTE ONLY): 10 min  Past Medical History:  Past Medical History:  Diagnosis Date  . Hyperglycemia 03/16/2013  . Hypertension   . Stomach cancer [facility])    Past Surgical History:  Past Surgical History:  Procedure Laterality Date  . ESOPHAGOGASTRODUODENOSCOPY N/A 11/10/2017   Procedure: ESOPHAGOGASTRODUODENOSCOPY (EGD);  Surgeon: Jerene Bears, MD;  Location: [facility];  Service: Gastroenterology;  Laterality: N/A;  . GASTROJEJUNOSTOMY N/A 11/12/2017   Procedure: Diagnostic Laparoscopy with biopsy, gastojejunostomy, insertion of G-tube and J-tube;  Surgeon: [name], MD;  Location: [facility];  Service: General;  Laterality: N/A;  . NO PAST SURGERIES  12/21/2011  . PORTACATH PLACEMENT Left 11/20/2017   Procedure: INSERTION PORT-A-CATH;  Surgeon: Ralene Ok, MD;  Location: [facility];  Service: General;  Laterality: Left;   HPI:  84 y.o. male, speaks [place] and limited [place], with PMH significant for gastric cancer receiving chemotherapy and HTN. Recently seen in the ER for dislodged PEG tube, whichhad to be reinserted. On 2/15 pt was sent over from [facility] for evaluation of AMS, hypotension, and tachycardia. Admitted for anterior MI and bilateral PNA.Feeding tube replaced and feeding resumed per RD.  Pt takes POs for pleasure and has tolerated well per RN's observations.    Assessment / Plan / Recommendation Clinical Impression  Pt amenable to participate in swallow assessment; wife present.  Basic English and gesture allowed for adequate communication re: swallowing.  Pt consumed thin liquids, pudding -self fed independently with active mastication, brisk swallow response, no overt s/s of aspiration.  RN confirms no observable difficulty  thus far.  Recommend continuing PO diet for pleasure and according to pt's preferences.  No SLP intervention is warranted.  Our services will respectfully sign off.  SLP Visit Diagnosis: Dysphagia, unspecified (R13.10)    Aspiration Risk       Diet Recommendation   regular solids/thin liquids per pt's preferences  Medication Administration: Whole meds with liquid    Other  Recommendations Oral Care Recommendations: Oral care BID   Follow up Recommendations None      Frequency and Duration            Prognosis        Swallow Study   General Date of Onset: 02/14/18 HPI: 84 y.o. male, speaks [place] and limited [place], with PMH significant for gastric cancer receiving chemotherapy and HTN. Recently seen in the ER for fallen out PEG tube, whichhad to be reinserted. On 2/15 pt was sent over from [facility] for evaluation of AMS, hypotension, and tachycardia. Admitted for anterior MI and bilateral PNA.Feeding tube replaced and feeding resumed per RD.  Pt takes POs for pleasure and has tolerated well per RN's observations.  Type of Study: Bedside Swallow Evaluation Diet Prior to this Study: Regular;Thin liquids Temperature Spikes Noted: No Respiratory Status: Room air History of Recent Intubation: No Behavior/Cognition: Alert;Cooperative Oral Cavity Assessment: Within Functional Limits Oral Care Completed by SLP: No Oral Cavity - Dentition: Missing dentition Vision: Functional for self-feeding Self-Feeding Abilities: Able to feed self Patient Positioning: Upright in bed Baseline Vocal Quality: Normal Volitional Cough: Strong Volitional Swallow: Able to elicit    Oral/Motor/Sensory Function Overall Oral Motor/Sensory Function: Within functional limits   Ice Chips Ice chips: Within functional limits   Thin Liquid Thin Liquid: Within  functional limits    Nectar Thick Nectar Thick Liquid: Not tested   Honey Thick Honey Thick Liquid: Not tested   Puree Puree: Within  functional limits   Solid   GO   Solid: Not tested        Lee Pratt 02/18/2018,3:17 PM

## 2018-02-18 NOTE — Progress Notes (Signed)
Pharmacy Antibiotic Note  Lee Pratt is a 84 y.o. male known to pharmacy from heparin dosing for STEMI. Sent from [facility] rigors, fever, and weakness. Pharmacy to dose Vancomycin, Zosyn for sepsis.  Plan:  Continue vancomycin 1000 mg IV already given, then 750 mg IV q24 hr (est AUC 485 based on SCr 1)  Measure vancomycin AUC at steady state as indicated  Zosyn 3.375 g IV given once over 30 minutes, then every 8 hrs by 4-hr infusion  Daily SCr while on Vanc/Zosyn combination   Height: 5\' 9"  (175.3 cm) Weight: 126 lb 5.2 oz (57.3 kg) IBW/kg (Calculated) : 70.7  Temp (24hrs), Avg:98.2 F (36.8 C), Min:97.4 F (36.3 C), Max:98.5 F (36.9 C)  Recent Labs  Lab 02/14/18 1712 02/14/18 1723 02/14/18 2030 02/15/18 0032 02/15/18 0420 02/16/18 0357 02/16/18 0914 02/17/18 0446 02/18/18 0320  WBC 6.1  --   --   --  7.1 8.1  --  10.5 9.2  CREATININE 1.04  --   --   --  0.96 1.00  --  1.09 0.93  LATICACIDVEN  --  12.06* 3.74* 2.8*  --   --  1.1 1.3  --     Estimated Creatinine Clearance: 49.6 mL/min (by C-G formula based on SCr of 0.93 mg/dL).    No Known Allergies    Antimicrobials this admission: 2/ 15 fluconazole >> 2/17 2/15 vanco >>  2/15 zosyn >>   Microbiology results: 2/15 BCx: ngtd 2/15 UCx:  NGF 2/15 L abd wound: Kleb pneumo ( R to ampicillin, sensitive to rest), Serratia marcesens ( R to cefazolin, sens to rest)  2/15 GI panel: neg 2/15 C. Diff: neg/neg 2/16 sputum: rare Klebsiella pneumoniae  2/16 MRSA PCR: neg    Thank you for allowing pharmacy to be a part of this patient's care.   Royetta Asal, PharmD, BCPS Pager [phone] 02/18/2018 8:27 AM

## 2018-02-18 NOTE — Progress Notes (Signed)
[place] for IV heparin Indication: anterior STEMI  No Known Allergies  Patient Measurements: Height: 5\' 9"  (175.3 cm) Weight: 128 lb 12 oz (58.4 kg) IBW/kg (Calculated) : 70.7 Heparin Dosing Weight: total body weight  Vital Signs: Temp: 97.4 F (36.3 C) (02/19 0321) Temp Source: Oral (02/19 0321) BP: 119/66 (02/19 0000) Pulse Rate: 82 (02/19 0000)  Labs: Recent Labs    02/15/18 0420 02/15/18 1125  02/16/18 0357  02/17/18 0446 02/17/18 1805 02/18/18 0320  HGB 10.0*  --   --  10.1*  --  10.0*  --  9.7*  HCT 29.1*  --   --  29.8*  --  30.1*  --  28.9*  PLT 260  --   --  212  --  244  --  227  HEPARINUNFRC  --   --    < > 0.25*   < > 0.22* 0.21* 0.39  CREATININE 0.96  --   --  1.00  --  1.09  --   --   TROPONINI  --  21.08*  --  14.54*  --  7.00*  --   --    < > = values in this interval not displayed.    Estimated Creatinine Clearance: 43.2 mL/min (by C-G formula based on SCr of 1.09 mg/dL).   Medical History: Past Medical History:  Diagnosis Date  . Hyperglycemia 03/16/2013  . Hypertension   . Stomach cancer ([place])     Medications:  Medications Prior to Admission  Medication Sig Dispense Refill Last Dose  . aspirin EC 81 MG tablet Take 81 mg by mouth 3 (three) times a week.    02/14/2018 at Unknown time  . docusate sodium (COLACE) 100 MG capsule Take 1 capsule (100 mg total) by mouth 2 (two) times daily as needed for mild constipation. (Patient not taking: Reported on 02/13/2018) 30 capsule 1 Completed Course at Unknown time  . lidocaine-prilocaine (EMLA) cream Apply to affected area once (Patient not taking: Reported on 02/13/2018) 30 g 3 Completed Course at Unknown time  . ondansetron (ZOFRAN ODT) 8 MG disintegrating tablet Take 1 tablet (8 mg total) by mouth 2 (two) times daily as needed for nausea or vomiting. (Patient not taking: Reported on 02/13/2018) 20 tablet 1 Completed Course at Unknown time  . ondansetron (ZOFRAN) 8 MG  tablet Take 1 tablet (8 mg total) by mouth 2 (two) times daily as needed for refractory nausea / vomiting. Start on day 3 after chemotherapy. (Patient not taking: Reported on 02/13/2018) 30 tablet 1 Completed Course at Unknown time  . prochlorperazine (COMPAZINE) 10 MG tablet Take 1 tablet (10 mg total) by mouth every 6 (six) hours as needed (Nausea or vomiting). (Patient not taking: Reported on 02/13/2018) 30 tablet 1 Completed Course at Unknown time   Scheduled:  . aspirin EC  81 mg Oral Daily  . atorvastatin  40 mg Oral q1800  . Chlorhexidine Gluconate Cloth  6 each Topical Q0600  . Chlorhexidine Gluconate Cloth  6 each Topical Daily  . clopidogrel  75 mg Oral Daily  . feeding supplement (ENSURE ENLIVE)  237 mL Oral TID BM  . lisinopril  2.5 mg Oral Daily  . metoprolol tartrate  25 mg Oral BID   Infusions:  . sodium chloride 250 mL (02/17/18 1800)  . heparin 1,050 Units/hr (02/17/18 1955)  . norepinephrine (LEVOPHED) Adult infusion Stopped (02/15/18 1230)  . piperacillin-tazobactam (ZOSYN)  IV Stopped (02/18/18 0144)  . vancomycin Stopped (02/17/18 1907)  Assessment: 37 yoM with PMH metastatic gastric cancer on chemo sent from [facility] rigors, fever, and weakness. Found to be in sepsis and EKG also shows STEMI albeit without cardiac symptoms. Not a candidate for invasive procedures d/t multiple comorbidities. Cardiology is starting IV heparin per pharmacy along with ASA/Plavix.   Baseline INR wnl, aPTT not done  Prior anticoagulation: ASA 81 mg only  Significant events:  2/18  Heparin level remains subtherapeutic at 0.21 despite rate increase to 900 units/hr today   No disruptions in therapy nor issues with IV site per RN  No plan for heart catheterizaztion d/t co-morbidities Today, 2/19  0320 HL=0.39 at goal, Heparin to stop today at 0600  Goal of Therapy: Heparin level 0.3-0.7 units/ml Monitor platelets by anticoagulation protocol: Yes  Plan:  Continue heparin drip at  1050 units/hr  Heparin to stop 2/19 at 0600  Dorrene German 02/18/2018 4:13 AM

## 2018-02-18 NOTE — Progress Notes (Addendum)
PROGRESS NOTE    Lee Pratt  XLK:[id number] DOB: 03-May-1934 DOA: 02/14/2018 PCP: [name], MD   Brief Narrative:  Per HPI  Lee Pratt  is Lee Pratt 84 y.o. male, of [place] descent, with past medical history significant for gastric cancer on treatment , hypertension and hyperglycemia who was sent from the cancer center for evaluation of altered mental status, hypotension and tachycardia.  His PEG tube fell yesterday and had to be inserted and was in our emergency room.  The patient went to the [facility] today to have his [place] Upperman-Cath removed when he started having the symptoms.  In the emergency room, the patient was noted to have ST elevations and had Marenda Accardi temperature of 102.  Cardiology was consulted and they advised starting the patient on heparin drip.  The patient is DNR/DNI so no procedure was going to be done on him.  His blood pressure continued dropping and he was started on dopamine drip in addition to IV fluids.  Most of the history was taken in the presence of an interpreter.  Assessment & Plan:   Active Problems:   Acute anterior wall MI (HCC)   AMI (acute myocardial infarction) ([place])   Acute myocardial infarction (HCC)   Pressure injury of skin   Protein-calorie malnutrition, severe   HCAP (healthcare-associated pneumonia)   STEMI: being treated medically.  Heparin gtt to stop 2/19 at 0600 (2/15-2/19).  Continue ASA, plavix, and beta blocker.  Troponin downtrending.   Cardiology following, appreciate recs  CAP: Fluconazole has been d/c'd.  Continue vancomycin and zosyn (2/15 - ).  Seems to be improving from resp standpoint.  On room air.  Follow blood cx (NGTD) Sputum cx with rare klebsiella pneumonia (pending sensitivities)    CXR from today with pulm vascular congestion with pulm edema, felt to represent Stewart Pimenta degree of CHF.  Also probable loculated effusion along the R minor fissue and bilateral L>R effusions.   Follow CXR pending (attn to effusion) SLP eval  ?J tube Site Wound  Infection: will c/s sugery.  Sounds like J tube fell out around 2/14.  Has greenish drainage from site here.  Continue abx broad spectrum abx.  Notably wound culture grew serratia and klebsiella on 2/15 and sputum cx above with klebsiella as well.  If pt continuing to do well, could consider d/c vanc and continue abx to cover klebsiella and serratia per sensitivities.    HFrEF:  EF 35-40%.  On ace, bb.  CXR with some pulm edema, will ctm for now, given relative stability from resp standpoint Lasix per cards Follow 2 view CXR tomorrow  Gastric Adenocarcionma with peritoneal metastasis s/p gastrojejunostomy and PEG placement: s/p cycle 5 of folfox Dr. Burr Medico added to treatment team, appreciate recs (consider 12fu induced MI, holding chemo for at least 1 month) Nutrition c/s for restarting tube feeds  Iron Deficiency Anemia: follow H/H  Hypokalemia: follow mag, replete prn   Discontinue foley, follow trial of void  DVT prophylaxis: heparin gtt -> start lovenox for dvt ppx Code Status: DNR Family Communication: son at bedside on 2/18 Disposition Plan: pending   Consultants:   Cardiology  PCCM transfer  Procedures:  Study Conclusions  - Left ventricle: The cavity size was normal. There was mild   concentric hypertrophy. Systolic function was moderately reduced.   The estimated ejection fraction was in the range of 35% to 40%.   Anterior, anteroseptal, apical and inferoapical severe   hypokinesis to akinesis, suggestive of LAD territory   ischemia/infarct. Doppler  parameters are consistent with   pseudonormal left ventricular relaxation (grade 2 diastolic   dysfunction). LV filling pressure is elevated. - Aortic valve: Mildly calcified with mild stenosis. There was mild   regurgitation. Valve area (Vmax): 1.67 cm^2. - Mitral valve: Mildly thickened leaflets . There was mild   regurgitation. - Left atrium: The atrium was normal in size. - Right ventricle: The cavity size was  mildly dilated. Mild   hypokinesis. - Tricuspid valve: There was mild regurgitation. - Pulmonary arteries: PA peak pressure: 47 mm Hg (S). - Inferior vena cava: The vessel was dilated. The respirophasic   diameter changes were blunted (< 50%), consistent with elevated   central venous pressure.  Impressions:  - Compared to Kristene Liberati prior study in 2012, there has been Jahniyah Revere significant   reduction in LVEF to 35-40% with severe LAD territory wall motion   abnormality which is new, suggestive of ischemia or infarct -   there is moderate diastolic dysfunction with elevated LV filling   pressure.  Antimicrobials:  Anti-infectives (From admission, onward)   Start     Dose/Rate Route Frequency Ordered Stop   02/15/18 2200  fluconazole (DIFLUCAN) IVPB 400 mg  Status:  Discontinued     400 mg 100 mL/hr over 120 Minutes Intravenous Every 24 hours 02/14/18 2159 02/14/18 2202   02/15/18 2200  fluconazole (DIFLUCAN) IVPB 200 mg  Status:  Discontinued     200 mg 100 mL/hr over 60 Minutes Intravenous Every 24 hours 02/14/18 2202 02/16/18 0841   02/15/18 1800  vancomycin (VANCOCIN) IVPB 750 mg/150 ml premix     750 mg 150 mL/hr over 60 Minutes Intravenous Every 24 hours 02/14/18 2116     02/14/18 2300  piperacillin-tazobactam (ZOSYN) IVPB 3.375 g     3.375 g 12.5 mL/hr over 240 Minutes Intravenous Every 8 hours 02/14/18 2116     02/14/18 2300  fluconazole (DIFLUCAN) IVPB 400 mg     400 mg 100 mL/hr over 120 Minutes Intravenous  Once 02/14/18 2202 02/15/18 0730   02/14/18 2200  fluconazole (DIFLUCAN) IVPB 800 mg  Status:  Discontinued     800 mg 100 mL/hr over 240 Minutes Intravenous  Once 02/14/18 2159 02/14/18 2202   02/14/18 1715  piperacillin-tazobactam (ZOSYN) IVPB 3.375 g     3.375 g 100 mL/hr over 30 Minutes Intravenous  Once 02/14/18 1714 02/14/18 1841   02/14/18 1715  vancomycin (VANCOCIN) IVPB 1000 mg/200 mL premix     1,000 mg 200 mL/hr over 60 Minutes Intravenous  Once 02/14/18 1714  02/14/18 2002       Subjective: No CP. No sob. J tube site problems since around the 12th. No other complaints. Declines to use interpreter, instead asks me to speak slowly and says he'll do ok.  Interpreter on phone available in case we had difficulties.  Objective: Vitals:   02/18/18 0000 02/18/18 0321 02/18/18 0400 02/18/18 0556  BP: 119/66  126/76   Pulse: 82  77   Resp: (!) 21  20   Temp:  (!) 97.4 F (36.3 C)    TempSrc:  Oral    SpO2: 97%  96%   Weight:    57.3 kg (126 lb 5.2 oz)  Height:        Intake/Output Summary (Last 24 hours) at 02/18/2018 0809 Last data filed at 02/18/2018 0600 Gross per 24 hour  Intake 953.05 ml  Output 925 ml  Net 28.05 ml   Filed Weights   02/16/18 0312 02/17/18 0916 02/18/18  0556  Weight: 58.1 kg (128 lb 1.4 oz) 58.4 kg (128 lb 12 oz) 57.3 kg (126 lb 5.2 oz)    Examination:  General: No acute distress. Cardiovascular: Heart sounds show Gregory Barrick regular rate, and rhythm. No gallops or rubs. No murmurs. No JVD. Lungs: Clear to auscultation bilaterally with good air movement. No rales, rhonchi or wheezes. Abdomen: Soft, nontender, nondistended with normal active bowel sounds. No masses. No hepatosplenomegaly. Gtube in place.  Greenish drainage. Neurological: Alert and oriented 3. Moves all extremities 4. Cranial nerves II through XII grossly intact. Skin: Warm and dry. No rashes or lesions. Extremities: No clubbing or cyanosis. No edema.  Psychiatric: Mood and affect are normal. Insight and judgment are appropriate.   Data Reviewed: I have personally reviewed following labs and imaging studies  CBC: Recent Labs  Lab 02/12/18 1104 02/14/18 1712 02/15/18 0420 02/16/18 0357 02/17/18 0446 02/18/18 0320  WBC 4.5 6.1 7.1 8.1 10.5 9.2  NEUTROABS 2.4 4.6  --   --   --   --   HGB  --  11.5* 10.0* 10.1* 10.0* 9.7*  HCT 32.1* 34.6* 29.1* 29.8* 30.1* 28.9*  MCV 91.4 92.8 90.1 90.6 91.8 92.0  PLT 252 395 260 212 244 269   Basic Metabolic  Panel: Recent Labs  Lab 02/14/18 1712 02/15/18 0420 02/16/18 0357 02/17/18 0446 02/18/18 0320  NA 139 142 139 140 139  K 4.8 3.6 2.7* 3.6 3.4*  CL 105 115* 110 109 107  CO2 16* 17* 20* 24 22  GLUCOSE 117* 108* 107* 123* 113*  BUN 37* 30* 25* 35* 34*  CREATININE 1.04 0.96 1.00 1.09 0.93  CALCIUM 8.6* 7.4* 7.7* 8.1* 7.8*  MG  --   --  2.1 2.1 2.0  PHOS  --   --  2.5 1.9*  --    GFR: Estimated Creatinine Clearance: 49.6 mL/min (by C-G formula based on SCr of 0.93 mg/dL). Liver Function Tests: Recent Labs  Lab 02/12/18 1101 02/14/18 1712 02/18/18 0320  AST 24 44* 40  ALT 26 31 25   ALKPHOS 118 94 55  BILITOT 0.4 0.6 1.0  PROT 6.8 6.8 5.4*  ALBUMIN 2.7* 3.0* 2.2*   No results for input(s): LIPASE, AMYLASE in the last 168 hours. No results for input(s): AMMONIA in the last 168 hours. Coagulation Profile: Recent Labs  Lab 02/14/18 1712  INR 1.10   Cardiac Enzymes: Recent Labs  Lab 02/15/18 0032 02/15/18 0352 02/15/18 1125 02/16/18 0357 02/17/18 0446  TROPONINI 12.65* 16.47* 21.08* 14.54* 7.00*   BNP (last 3 results) No results for input(s): PROBNP in the last 8760 hours. HbA1C: No results for input(s): HGBA1C in the last 72 hours. CBG: No results for input(s): GLUCAP in the last 168 hours. Lipid Profile: No results for input(s): CHOL, HDL, LDLCALC, TRIG, CHOLHDL, LDLDIRECT in the last 72 hours. Thyroid Function Tests: No results for input(s): TSH, T4TOTAL, FREET4, T3FREE, THYROIDAB in the last 72 hours. Anemia Panel: No results for input(s): VITAMINB12, FOLATE, FERRITIN, TIBC, IRON, RETICCTPCT in the last 72 hours. Sepsis Labs: Recent Labs  Lab 02/14/18 2018 02/14/18 2030 02/15/18 0032 02/15/18 0420 02/16/18 0357 02/16/18 0914 02/17/18 0446 02/18/18 0320  PROCALCITON 5.29  --   --  17.60 11.65  --   --  4.39  LATICACIDVEN  --  3.74* 2.8*  --   --  1.1 1.3  --     Recent Results (from the past 240 hour(s))  Aerobic Culture (superficial specimen)      Status: None  Collection Time: 02/14/18  4:15 PM  Result Value Ref Range Status   Specimen Description   Final    WOUND LEFT ABDOMEN Performed at [facility], [place] [address], [place], [place] [address]    Special Requests   Final    NONE Performed at [facility], [place] [address], [place], [place] [address]    Gram Stain   Final    NO WBC SEEN ABUNDANT GRAM NEGATIVE RODS ABUNDANT GRAM POSITIVE RODS FEW GRAM POSITIVE COCCI IN PAIRS Performed at [facility], [place] [address] [place], [place] [address]    Culture   Final    ABUNDANT KLEBSIELLA PNEUMONIAE ABUNDANT SERRATIA MARCESCENS    Report Status 02/17/2018 FINAL  Final   Organism ID, Bacteria KLEBSIELLA PNEUMONIAE  Final   Organism ID, Bacteria SERRATIA MARCESCENS  Final      Susceptibility   Klebsiella pneumoniae - MIC*    AMPICILLIN >=32 RESISTANT Resistant     CEFAZOLIN <=4 SENSITIVE Sensitive     CEFEPIME <=1 SENSITIVE Sensitive     CEFTAZIDIME <=1 SENSITIVE Sensitive     CEFTRIAXONE <=1 SENSITIVE Sensitive     CIPROFLOXACIN <=0.25 SENSITIVE Sensitive     GENTAMICIN <=1 SENSITIVE Sensitive     IMIPENEM <=0.25 SENSITIVE Sensitive     TRIMETH/SULFA <=20 SENSITIVE Sensitive     AMPICILLIN/SULBACTAM 4 SENSITIVE Sensitive     PIP/TAZO <=4 SENSITIVE Sensitive     Extended ESBL NEGATIVE Sensitive     * ABUNDANT KLEBSIELLA PNEUMONIAE   Serratia marcescens - MIC*    CEFAZOLIN >=64 RESISTANT Resistant     CEFEPIME <=1 SENSITIVE Sensitive     CEFTAZIDIME <=1 SENSITIVE Sensitive     CEFTRIAXONE <=1 SENSITIVE Sensitive     CIPROFLOXACIN <=0.25 SENSITIVE Sensitive     GENTAMICIN <=1 SENSITIVE Sensitive     TRIMETH/SULFA <=20 SENSITIVE Sensitive     * ABUNDANT SERRATIA MARCESCENS  Culture, blood (Routine x 2)     Status: None (Preliminary result)   Collection Time: 02/14/18  5:12 PM  Result Value Ref Range Status   Specimen Description   Final    BLOOD PORTA CATH Performed at  [facility], [place] [address], [place], [place] [address]    Special Requests   Final    BOTTLES DRAWN AEROBIC AND ANAEROBIC Blood Culture adequate volume Performed at [place] [address], [place], [place] [address]    Culture   Final    NO GROWTH 3 DAYS Performed at [facility], [place] [address], [place], [place] [address]    Report Status PENDING  Incomplete  Culture, blood (Routine x 2)     Status: None (Preliminary result)   Collection Time: 02/14/18  5:12 PM  Result Value Ref Range Status   Specimen Description   Final    BLOOD LEFT WRIST Performed at [place] [address], [place], [place] [address]    Special Requests   Final    BOTTLES DRAWN AEROBIC AND ANAEROBIC Blood Culture adequate volume Performed at [place] [address] [place], [place] [address]    Culture   Final    NO GROWTH 3 DAYS Performed at [facility], [place] [address], [place],  [address]    Report Status PENDING  Incomplete  Urine culture     Status: None   Collection Time: 02/14/18  5:12 PM  Result Value Ref Range Status  Specimen Description   Final    URINE, CATHETERIZED Performed at [facility], [place] [address], [place], [place] [address]    Special Requests   Final    Immunocompromised Performed at [facility], [place] [address], [place], [place] [address]    Culture   Final    NO GROWTH Performed at [facility], [place] [address], [place], [place] [address]    Report Status 02/16/2018 FINAL  Final  C difficile quick scan w PCR reflex     Status: None   Collection Time: 02/14/18  6:40 PM  Result Value Ref Range Status   C Diff antigen NEGATIVE NEGATIVE Final   C Diff toxin NEGATIVE NEGATIVE Final   C Diff interpretation No C. difficile detected.  Final    Comment: Performed at [facility], [place] [address],  [place], [place] [address]  Gastrointestinal Panel by PCR , Stool     Status: None   Collection Time: 02/14/18  6:40 PM  Result Value Ref Range Status   Campylobacter species NOT DETECTED NOT DETECTED Final   Plesimonas shigelloides NOT DETECTED NOT DETECTED Final   Salmonella species NOT DETECTED NOT DETECTED Final   Yersinia enterocolitica NOT DETECTED NOT DETECTED Final   Vibrio species NOT DETECTED NOT DETECTED Final   Vibrio cholerae NOT DETECTED NOT DETECTED Final   Enteroaggregative E coli (EAEC) NOT DETECTED NOT DETECTED Final   Enteropathogenic E coli (EPEC) NOT DETECTED NOT DETECTED Final   Enterotoxigenic E coli (ETEC) NOT DETECTED NOT DETECTED Final   Shiga like toxin producing E coli (STEC) NOT DETECTED NOT DETECTED Final   Shigella/Enteroinvasive E coli (EIEC) NOT DETECTED NOT DETECTED Final   Cryptosporidium NOT DETECTED NOT DETECTED Final   Cyclospora cayetanensis NOT DETECTED NOT DETECTED Final   Entamoeba histolytica NOT DETECTED NOT DETECTED Final   Giardia lamblia NOT DETECTED NOT DETECTED Final   Adenovirus F40/41 NOT DETECTED NOT DETECTED Final   Astrovirus NOT DETECTED NOT DETECTED Final   Norovirus GI/GII NOT DETECTED NOT DETECTED Final   Rotavirus Melea Prezioso NOT DETECTED NOT DETECTED Final   Sapovirus (I, II, IV, and V) NOT DETECTED NOT DETECTED Final    Comment: Performed at [facility], [place] [place], [place] [address]  Respiratory Panel by PCR     Status: None   Collection Time: 02/15/18  4:20 AM  Result Value Ref Range Status   Adenovirus NOT DETECTED NOT DETECTED Final   Coronavirus 229E NOT DETECTED NOT DETECTED Final   Coronavirus HKU1 NOT DETECTED NOT DETECTED Final   Coronavirus NL63 NOT DETECTED NOT DETECTED Final   Coronavirus OC43 NOT DETECTED NOT DETECTED Final   Metapneumovirus NOT DETECTED NOT DETECTED Final   Rhinovirus / Enterovirus NOT DETECTED NOT DETECTED Final   Influenza Emelyn Roen NOT DETECTED NOT DETECTED Final   Influenza B NOT  DETECTED NOT DETECTED Final   Parainfluenza Virus 1 NOT DETECTED NOT DETECTED Final   Parainfluenza Virus 2 NOT DETECTED NOT DETECTED Final   Parainfluenza Virus 3 NOT DETECTED NOT DETECTED Final   Parainfluenza Virus 4 NOT DETECTED NOT DETECTED Final   Respiratory Syncytial Virus NOT DETECTED NOT DETECTED Final   Bordetella pertussis NOT DETECTED NOT DETECTED Final   Chlamydophila pneumoniae NOT DETECTED NOT DETECTED Final   Mycoplasma pneumoniae NOT DETECTED NOT DETECTED Final    Comment: Performed at [facility], [place] [address], [place], [place] [address]  MRSA PCR Screening  Status: None   Collection Time: 02/15/18 12:21 PM  Result Value Ref Range Status   MRSA by PCR NEGATIVE NEGATIVE Final    Comment:        The GeneXpert MRSA Assay (FDA approved for NASAL specimens only), is one component of Marlise Fahr comprehensive MRSA colonization surveillance program. It is not intended to diagnose MRSA infection nor to guide or monitor treatment for MRSA infections. Performed at [facility], [place] [address] [place], [place] [address]   Culture, expectorated sputum-assessment     Status: None   Collection Time: 02/15/18 11:47 PM  Result Value Ref Range Status   Specimen Description SPU  Final   Special Requests NONE  Final   Sputum evaluation   Final    THIS SPECIMEN IS ACCEPTABLE FOR SPUTUM CULTURE Performed at [facility], [place] [address], [place], [place] [address]    Report Status 02/16/2018 FINAL  Final  Culture, respiratory (NON-Expectorated)     Status: None (Preliminary result)   Collection Time: 02/15/18 11:47 PM  Result Value Ref Range Status   Specimen Description   Final    SPU Performed at [place] [address], [place], [place] [address]    Special Requests   Final    NONE Reflexed from [id number] Performed at [place] [address], [place], [place] [address]    Gram Stain    Final    MODERATE WBC PRESENT, PREDOMINANTLY PMN NO ORGANISMS SEEN    Culture   Final    RARE KLEBSIELLA PNEUMONIAE SUSCEPTIBILITIES TO FOLLOW Performed at [facility], [place] [address] [place], [place] [address]    Report Status PENDING  Incomplete         Radiology Studies: Dg Chest Port 1 View  Result Date: 02/17/2018 CLINICAL DATA:  Respiratory failure EXAM: PORTABLE CHEST 1 VIEW COMPARISON:  February 16, 2018 chest radiograph and chest CT February 10, 2018 FINDINGS: [place] Mastrangelo-Cath tip is in the superior vena cava. No pneumothorax. Ovoid opacity along the right minor fissure is stable and felt to represent loculated fluid. Elsewhere there is interstitial edema with pleural effusions bilaterally, left larger than right, stable. There is bibasilar atelectasis. There are scattered calcified granulomas on the right. There is Aynslee Mulhall skin fold on the right. No pneumothorax appreciable. Heart size is upper normal with pulmonary vascularity showing evidence of pulmonary venous hypertension. There is aortic atherosclerosis. No adenopathy. No evident bone lesions. IMPRESSION: Pulmonary vascular congestion with pulmonary edema, felt to represent Zakaria Fromer degree of congestive heart failure. Probable loculated effusion along the right minor fissure. Pleural effusions bilaterally, left larger than right, stable. No change in Port-[place] Pogorzelski-Cath position. No pneumothorax. There is aortic atherosclerosis. Aortic Atherosclerosis (ICD10-I70.0). Electronically Signed   By: Lowella Grip III M.D.   On: 02/17/2018 07:05        Scheduled Meds: . aspirin EC  81 mg Oral Daily  . atorvastatin  40 mg Oral q1800  . Chlorhexidine Gluconate Cloth  6 each Topical Q0600  . Chlorhexidine Gluconate Cloth  6 each Topical Daily  . clopidogrel  75 mg Oral Daily  . feeding supplement (ENSURE ENLIVE)  237 mL Oral TID BM  . lisinopril  2.5 mg Oral Daily  . metoprolol tartrate  25 mg Oral BID  . potassium chloride  40 mEq Oral  Once   Continuous Infusions: . sodium chloride 250 mL (02/18/18 0000)  . norepinephrine (LEVOPHED) Adult infusion Stopped (02/15/18 1230)  . piperacillin-tazobactam (  ZOSYN)  IV 3.375 g (02/18/18 0520)  . vancomycin Stopped (02/17/18 1907)     LOS: 4 days    Time spent: over 61 min    Fayrene Helper, MD [facility] Pager [phone]  If 7PM-7AM, please contact night-coverage [email or web address] Password [email or web address] 02/18/2018, 8:09 AM

## 2018-02-18 NOTE — Progress Notes (Addendum)
Nutrition Follow-up  DOCUMENTATION CODES:   Underweight, Severe malnutrition in context of chronic illness  INTERVENTION:   Osmolite 1.5  @ 20 ml/hr increase by 10 ml every 6 hours to goal rate 60 ml/hr Provides: 2160 kcals, 90 grams protein, 1094 ml free water. Meets 100% of calorie and protein needs.   Will continue Ensure Enlive po as tolerated.   Monitor magnesium, potassium, and phosphorus daily for at least 3 days, MD to replete as needed, as pt is at risk for refeeding syndrome.   NUTRITION DIAGNOSIS:   Severe Malnutrition related to chronic illness, cancer and cancer related treatments as evidenced by severe fat depletion, severe muscle depletion.  Ongoing  GOAL:   Patient will meet greater than or equal to 90% of their needs  Not Meeting- Initiate TF via PEG  MONITOR:   PO intake, Supplement acceptance, Weight trends, Labs  REASON FOR ASSESSMENT:   Malnutrition Screening Tool    ASSESSMENT:   Pt with PMH significant for gastric cancer receiving chemotherapy and HTN. Recently seen in the ER for fallen out PEG tube, which has to be reinserted. On 2/15 pt was sent over from [facility] for evaluation of AMS, hypotension, and tachycardia. Admitted for anterior MI and bilateral PNA.   CCS replaced J tube today (remains clamped). Spoke with RN who reports we will feed though G tube. RD consulted for tube feeding management. Will continue with formula pt was receiving at home. Pt not taking much PO. Ensure was ordered yesterday, pt drank a couple sips at most. Will continue for comfort.   Weight noted to trend down 2 lb since last RD visit 12/18 (128lb-126 lb)  Medications reviewed and include: IV abx Labs reviewed: K 3.4 (L) Phosphorus 1.9 (L)  Diet Order:  Diet Heart Room service appropriate? Yes; Fluid consistency: Thin  EDUCATION NEEDS:   Education needs have been addressed  Skin:  Skin Assessment: Skin Integrity Issues: Skin Integrity Issues:: Stage  II Stage II: sacrum  Last BM:  02/15/18  Height:   Ht Readings from Last 1 Encounters:  02/14/18 5\' 9"  (1.753 m)    Weight:   Wt Readings from Last 1 Encounters:  02/18/18 126 lb 5.2 oz (57.3 kg)    Ideal Body Weight:  72.7 kg  BMI:  Body mass index is 18.65 kg/m.  Estimated Nutritional Needs:   Kcal:  2000-2200 kcal/day  Protein:  75-90 g/day  Fluid:  >2.2 L/day    Mariana Single RD, LDN Clinical Nutrition Pager # - [phone]

## 2018-02-18 NOTE — Progress Notes (Signed)
Called 2/2 tachycardia.  Was noted to be in SVT  for about 10 minutes.  Patient sleeping and asymptomatic. By time EKG (appeared similar to prior, sinus rhythm) and vitals completed after I was called, he was back in sinus rhythm with normal vital signs. Increased metoprolol to 37.5 mg BID

## 2018-02-18 NOTE — Progress Notes (Signed)
Progress Note  Patient Name: Lee Pratt Date of Encounter: 02/18/2018  Primary Cardiologist: Sherren Mocha, MD   Subjective   Denies CP or SOB, lying in bed  Inpatient Medications    Scheduled Meds: . aspirin EC  81 mg Oral Daily  . atorvastatin  40 mg Oral q1800  . Chlorhexidine Gluconate Cloth  6 each Topical Q0600  . Chlorhexidine Gluconate Cloth  6 each Topical Daily  . clopidogrel  75 mg Oral Daily  . feeding supplement (ENSURE ENLIVE)  237 mL Oral TID BM  . lisinopril  2.5 mg Oral Daily  . metoprolol tartrate  25 mg Oral BID   Continuous Infusions: . sodium chloride 250 mL (02/18/18 0000)  . norepinephrine (LEVOPHED) Adult infusion Stopped (02/15/18 1230)  . piperacillin-tazobactam (ZOSYN)  IV 3.375 g (02/18/18 0520)  . vancomycin Stopped (02/17/18 1907)   PRN Meds: sodium chloride, acetaminophen **OR** acetaminophen, HYDROcodone-acetaminophen, ondansetron **OR** ondansetron (ZOFRAN) IV, prochlorperazine, sodium chloride flush, zolpidem   Vital Signs    Vitals:   02/18/18 0000 02/18/18 0321 02/18/18 0400 02/18/18 0556  BP: 119/66  126/76   Pulse: 82  77   Resp: (!) 21  20   Temp:  (!) 97.4 F (36.3 C)    TempSrc:  Oral    SpO2: 97%  96%   Weight:    126 lb 5.2 oz (57.3 kg)  Height:        Intake/Output Summary (Last 24 hours) at 02/18/2018 0745 Last data filed at 02/18/2018 0600 Gross per 24 hour  Intake 973.05 ml  Output 1050 ml  Net -76.95 ml   Filed Weights   02/16/18 0312 02/17/18 0916 02/18/18 0556  Weight: 128 lb 1.4 oz (58.1 kg) 128 lb 12 oz (58.4 kg) 126 lb 5.2 oz (57.3 kg)    Telemetry    SR, no ectopy - Personally Reviewed  ECG    SR, evolving anterior lateral MI, ST elevation gone  - Personally Reviewed  Physical Exam  Chronically ill chinese male  GEN: No acute distress.   Neck: JVD 8-9 cm Cardiac: RRR, no murmurs, rubs, or gallops.  Respiratory: bilateral rhonchi w/ some basilar rales GI: Soft, nontender, non-distended, +BS, J  tube in place, some drainage on dressing MS: No edema; No deformity, pulses intact Neuro:  Nonfocal  Psych: Normal affect  Left subclavian port-a - cath in place  Labs    Chemistry Recent Labs  Lab 02/12/18 1101 02/14/18 1712  02/16/18 0357 02/17/18 0446 02/18/18 0320  NA 137 139   < > 139 140 139  K 4.3 4.8   < > 2.7* 3.6 3.4*  CL 101 105   < > 110 109 107  CO2 27 16*   < > 20* 24 22  GLUCOSE 98 117*   < > 107* 123* 113*  BUN 25 37*   < > 25* 35* 34*  CREATININE 0.78 1.04   < > 1.00 1.09 0.93  CALCIUM 8.7 8.6*   < > 7.7* 8.1* 7.8*  PROT 6.8 6.8  --   --   --  5.4*  ALBUMIN 2.7* 3.0*  --   --   --  2.2*  AST 24 44*  --   --   --  40  ALT 26 31  --   --   --  25  ALKPHOS 118 94  --   --   --  55  BILITOT 0.4 0.6  --   --   --  1.0  GFRNONAA >60 >60   < > >60 >60 >60  GFRAA >60 >60   < > >60 >60 >60  ANIONGAP 9 18*   < > 9 7 10    < > = values in this interval not displayed.     Hematology Recent Labs  Lab 02/16/18 0357 02/17/18 0446 02/18/18 0320  WBC 8.1 10.5 9.2  RBC 3.29* 3.28* 3.14*  HGB 10.1* 10.0* 9.7*  HCT 29.8* 30.1* 28.9*  MCV 90.6 91.8 92.0  MCH 30.7 30.5 30.9  MCHC 33.9 33.2 33.6  RDW 19.3* 18.5* 18.3*  PLT 212 244 227    Cardiac Enzymes Recent Labs  Lab 02/15/18 0352 02/15/18 1125 02/16/18 0357 02/17/18 0446  TROPONINI 16.47* 21.08* 14.54* 7.00*    Recent Labs  Lab 02/14/18 1729  TROPIPOC 0.04     BNPNo results for input(s): BNP, PROBNP in the last 168 hours.   Radiology    Dg Chest Port 1 View  Result Date: 02/17/2018 CLINICAL DATA:  Respiratory failure EXAM: PORTABLE CHEST 1 VIEW COMPARISON:  February 16, 2018 chest radiograph and chest CT February 10, 2018 FINDINGS: Port-A-Cath tip is in the superior vena cava. No pneumothorax. Ovoid opacity along the right minor fissure is stable and felt to represent loculated fluid. Elsewhere there is interstitial edema with pleural effusions bilaterally, left larger than right, stable. There  is bibasilar atelectasis. There are scattered calcified granulomas on the right. There is a skin fold on the right. No pneumothorax appreciable. Heart size is upper normal with pulmonary vascularity showing evidence of pulmonary venous hypertension. There is aortic atherosclerosis. No adenopathy. No evident bone lesions. IMPRESSION: Pulmonary vascular congestion with pulmonary edema, felt to represent a degree of congestive heart failure. Probable loculated effusion along the right minor fissure. Pleural effusions bilaterally, left larger than right, stable. No change in Port-A-Cath position. No pneumothorax. There is aortic atherosclerosis. Aortic Atherosclerosis (ICD10-I70.0). Electronically Signed   By: Lowella Grip III M.D.   On: 02/17/2018 07:05    Cardiac Studies   Echo EF 30-35% no mechanical complications from MI  Patient Profile     84 y.o. male  With metastatic gastric cancer , HTN admitted 02/14 with weakness , chills.   Found to have ST elevation anterior MI. Not a cath candidate due to metastatic cancer  Assessment & Plan    1. STEMI: No arrhythmia in >24 hr, ST elevation on ECG resolved. Last Troponin 7, down from 21  Continue ASA and beta blocker  2. Ischemic DCM:  On BB and low dose ACE, with pulm edema on CXR, will give 1 dose Lasix 20 mg and supp K+ 3. Gastric Cancer:  DNR care per primary service, chemo on hold  Ok to tx telemetry.   Dr Johnsie Cancel discussed care with son Will who is a respiratory therapist in [place] on 02/18  For questions or updates, please contact [facility] Please consult [email or web address] for contact info under Cardiology/STEMI.      Signed, Rosaria Ferries, PA-C  02/18/2018, 7:45 AM    Patient examined Elderly chinese male in no distress Mild basilar rales no new murmur or mechanical complications anterior MI with resolution of ST elevation on ECG and decrease in troponins Ok to d/c heparin at this time. Continue beta blocker and ACE  DNR  due to co morbidities , age and metastatic cancer  Jenkins Rouge

## 2018-02-18 NOTE — Consult Note (Signed)
Reason for Consult: Drainage from J-tube site. Referring Physician: Dr. Riley Kill Junior   Lee Pratt is an 84 y.o. male.  HPI: Patient is an 84 year old man who was admitted with gastric outlet obstruction secondary to a gastric mass, 11/09/17.  He was diagnosed with stage IV gastric cancer and underwent diagnostic laparoscopy with biopsy, gastrojejunostomy insertion of a G-tube and a J-tube on 09/47/09 by Dr. [name].  Peritoneum biopsy confirmed adenocarcinoma.  Patient was placed on tube feedings and reached goal.  He started having bowel movements.  A Port-A-Cath was placed on the left on 11/20/17 by Dr. Ralene Ok.  Patient was discharged home on 11/20/17.  Since that time he has been on Chemotherapy directed by Dr.Yan Burr Medico. On his visit 01/29/18 to Dr. Burr Medico goals of care were discussed for this incurable cancer.  J tube feedings were at 50 ml/hr x 16 hours per day.  He was taking some baby food, but malnutrition was an ongoing issue. J tube became dislodged aroundd 02/10/18 seen on CT scan.  He ws using gastrostomy tube for tube feedings at that point.He presented to the ED on 2/14 with the j tube out completely.  He was to follow up with Dr Marcello Moores for removal of the J tube.  A dressing was placed over the site in the ED. Pt came in on 2/15 to have his chemotherapy pump disconnected and was weak and hypotensive.  He was sent to the ED where he was admitted after a Code Sepsis.  EKG showed sinus tachycardia, and anterior STEMI.  Because of his premorbid cancer he was not a candidate for intervention, and was treated with medical therapy including Heparin, aspirin, and clopidogrel.    Community acquired pneumonia was also diagnosed.  He is draining from the J tube site and we are ask to see.  Past Medical History:  Diagnosis Date  . Hyperglycemia 03/16/2013  . Hypertension   . Stomach cancer [facility])     Past Surgical History:  Procedure Laterality Date  . ESOPHAGOGASTRODUODENOSCOPY N/A  11/10/2017   Procedure: ESOPHAGOGASTRODUODENOSCOPY (EGD);  Surgeon: Jerene Bears, MD;  Location: [facility];  Service: Gastroenterology;  Laterality: N/A;  . GASTROJEJUNOSTOMY N/A 11/12/2017   Procedure: Diagnostic Laparoscopy with biopsy, gastojejunostomy, insertion of G-tube and J-tube;  Surgeon: [name], MD;  Location: [facility];  Service: General;  Laterality: N/A;  . NO PAST SURGERIES  12/21/2011  . PORTACATH PLACEMENT Left 11/20/2017   Procedure: INSERTION PORT-A-CATH;  Surgeon: Ralene Ok, MD;  Location: [facility];  Service: General;  Laterality: Left;    Family History  Problem Relation Age of Onset  . Cancer Father        unknown type   . Cancer Brother        liver    Social History:  reports that he quit smoking about 5 years ago. His smoking use included cigarettes. He has a 12.50 pack-year smoking history. he has never used smokeless tobacco. He reports that he does not drink alcohol or use drugs.  Allergies: No Known Allergies  Medications:  Prior to Admission:  Medications Prior to Admission  Medication Sig Dispense Refill Last Dose  . aspirin EC 81 MG tablet Take 81 mg by mouth 3 (three) times a week.    02/14/2018 at Unknown time  . docusate sodium (COLACE) 100 MG capsule Take 1 capsule (100 mg total) by mouth 2 (two) times daily as needed for mild constipation. (Patient not taking: Reported on 02/13/2018) 30 capsule  1 Completed Course at Unknown time  . lidocaine-prilocaine (EMLA) cream Apply to affected area once (Patient not taking: Reported on 02/13/2018) 30 g 3 Completed Course at Unknown time  . ondansetron (ZOFRAN ODT) 8 MG disintegrating tablet Take 1 tablet (8 mg total) by mouth 2 (two) times daily as needed for nausea or vomiting. (Patient not taking: Reported on 02/13/2018) 20 tablet 1 Completed Course at Unknown time  . ondansetron (ZOFRAN) 8 MG tablet Take 1 tablet (8 mg total) by mouth 2 (two) times daily as needed for refractory nausea / vomiting.  Start on day 3 after chemotherapy. (Patient not taking: Reported on 02/13/2018) 30 tablet 1 Completed Course at Unknown time  . prochlorperazine (COMPAZINE) 10 MG tablet Take 1 tablet (10 mg total) by mouth every 6 (six) hours as needed (Nausea or vomiting). (Patient not taking: Reported on 02/13/2018) 30 tablet 1 Completed Course at Unknown time   Scheduled: . aspirin EC  81 mg Oral Daily  . atorvastatin  40 mg Oral q1800  . Chlorhexidine Gluconate Cloth  6 each Topical Q0600  . Chlorhexidine Gluconate Cloth  6 each Topical Daily  . clopidogrel  75 mg Oral Daily  . enoxaparin (LOVENOX) injection  40 mg Subcutaneous Q24H  . feeding supplement (ENSURE ENLIVE)  237 mL Oral TID BM  . lisinopril  2.5 mg Oral Daily  . metoprolol tartrate  25 mg Oral BID   Continuous: . sodium chloride 250 mL (02/18/18 0000)  . norepinephrine (LEVOPHED) Adult infusion Stopped (02/15/18 1230)  . piperacillin-tazobactam (ZOSYN)  IV Stopped (02/18/18 0920)  . vancomycin Stopped (02/17/18 1907)   Anti-infectives (From admission, onward)   Start     Dose/Rate Route Frequency Ordered Stop   02/15/18 2200  fluconazole (DIFLUCAN) IVPB 400 mg  Status:  Discontinued     400 mg 100 mL/hr over 120 Minutes Intravenous Every 24 hours 02/14/18 2159 02/14/18 2202   02/15/18 2200  fluconazole (DIFLUCAN) IVPB 200 mg  Status:  Discontinued     200 mg 100 mL/hr over 60 Minutes Intravenous Every 24 hours 02/14/18 2202 02/16/18 0841   02/15/18 1800  vancomycin (VANCOCIN) IVPB 750 mg/150 ml premix     750 mg 150 mL/hr over 60 Minutes Intravenous Every 24 hours 02/14/18 2116     02/14/18 2300  piperacillin-tazobactam (ZOSYN) IVPB 3.375 g     3.375 g 12.5 mL/hr over 240 Minutes Intravenous Every 8 hours 02/14/18 2116     02/14/18 2300  fluconazole (DIFLUCAN) IVPB 400 mg     400 mg 100 mL/hr over 120 Minutes Intravenous  Once 02/14/18 2202 02/15/18 0730   02/14/18 2200  fluconazole (DIFLUCAN) IVPB 800 mg  Status:  Discontinued      800 mg 100 mL/hr over 240 Minutes Intravenous  Once 02/14/18 2159 02/14/18 2202   02/14/18 1715  piperacillin-tazobactam (ZOSYN) IVPB 3.375 g     3.375 g 100 mL/hr over 30 Minutes Intravenous  Once 02/14/18 1714 02/14/18 1841   02/14/18 1715  vancomycin (VANCOCIN) IVPB 1000 mg/200 mL premix     1,000 mg 200 mL/hr over 60 Minutes Intravenous  Once 02/14/18 1714 02/14/18 2002      Results for orders placed or performed during the hospital encounter of 02/14/18 (from the past 48 hour(s))  Heparin level (unfractionated)     Status: None   Collection Time: 02/16/18  2:00 PM  Result Value Ref Range   Heparin Unfractionated 0.31 0.30 - 0.70 IU/mL    Comment:  IF HEPARIN RESULTS ARE BELOW EXPECTED VALUES, AND PATIENT DOSAGE HAS BEEN CONFIRMED, SUGGEST FOLLOW UP TESTING OF ANTITHROMBIN III LEVELS. Performed at [facility], [place] [address] [place], [place] [address]   CBC     Status: Abnormal   Collection Time: 02/17/18  4:46 AM  Result Value Ref Range   WBC 10.5 4.0 - 10.5 K/uL   RBC 3.28 (L) 4.22 - 5.81 MIL/uL   Hemoglobin 10.0 (L) 13.0 - 17.0 g/dL   HCT 30.1 (L) 39.0 - 52.0 %   MCV 91.8 78.0 - 100.0 fL   MCH 30.5 26.0 - 34.0 pg   MCHC 33.2 30.0 - 36.0 g/dL   RDW 18.5 (H) 11.5 - 15.5 %   Platelets 244 150 - 400 K/uL    Comment: Performed at [facility], [place] [address] [place], [place] [address]  Heparin level (unfractionated)     Status: Abnormal   Collection Time: 02/17/18  4:46 AM  Result Value Ref Range   Heparin Unfractionated 0.22 (L) 0.30 - 0.70 IU/mL    Comment:        IF HEPARIN RESULTS ARE BELOW EXPECTED VALUES, AND PATIENT DOSAGE HAS BEEN CONFIRMED, SUGGEST FOLLOW UP TESTING OF ANTITHROMBIN III LEVELS. Performed at [facility], [place] [address], [place], [place] [address]   Basic metabolic panel     Status: Abnormal   Collection Time: 02/17/18  4:46 AM  Result Value Ref Range   Sodium 140 135 -  145 mmol/L   Potassium 3.6 3.5 - 5.1 mmol/L    Comment: DELTA CHECK NOTED NO VISIBLE HEMOLYSIS    Chloride 109 101 - 111 mmol/L   CO2 24 22 - 32 mmol/L   Glucose, Bld 123 (H) 65 - 99 mg/dL   BUN 35 (H) 6 - 20 mg/dL   Creatinine, Ser 1.09 0.61 - 1.24 mg/dL   Calcium 8.1 (L) 8.9 - 10.3 mg/dL   GFR calc non Af Amer >60 >60 mL/min   GFR calc Af Amer >60 >60 mL/min    Comment: (NOTE) The eGFR has been calculated using the CKD EPI equation. This calculation has not been validated in all clinical situations. eGFR's persistently <60 mL/min signify possible Chronic Kidney Disease.    Anion gap 7 5 - 15    Comment: Performed at [facility], [place] [address] [place], [place] [address]  Magnesium     Status: None   Collection Time: 02/17/18  4:46 AM  Result Value Ref Range   Magnesium 2.1 1.7 - 2.4 mg/dL    Comment: Performed at [facility], [place] [address], [place], [place] [address]  Phosphorus     Status: Abnormal   Collection Time: 02/17/18  4:46 AM  Result Value Ref Range   Phosphorus 1.9 (L) 2.5 - 4.6 mg/dL    Comment: Performed at [facility], [place] [address] [place], [place] [address]  Troponin I     Status: Abnormal   Collection Time: 02/17/18  4:46 AM  Result Value Ref Range   Troponin I 7.00 (HH) <0.03 ng/mL    Comment: DELTA CHECK NOTED CRITICAL VALUE NOTED.  VALUE IS CONSISTENT WITH PREVIOUSLY REPORTED AND CALLED VALUE. Performed at [facility], [place] [address], [place], [place] [address]   Lactic acid, plasma     Status: None   Collection Time: 02/17/18  4:46 AM  Result Value Ref Range   Lactic Acid, Venous 1.3 0.5 - 1.9  mmol/L    Comment: Performed at [facility], [place] [address], [place], [place] [address]  Heparin level (unfractionated)     Status: Abnormal   Collection Time: 02/17/18  6:05 PM  Result Value Ref Range   Heparin Unfractionated 0.21 (L) 0.30 - 0.70 IU/mL     Comment:        IF HEPARIN RESULTS ARE BELOW EXPECTED VALUES, AND PATIENT DOSAGE HAS BEEN CONFIRMED, SUGGEST FOLLOW UP TESTING OF ANTITHROMBIN III LEVELS. Performed at [facility], [place] [address], [place], [place] [address]   CBC     Status: Abnormal   Collection Time: 02/18/18  3:20 AM  Result Value Ref Range   WBC 9.2 4.0 - 10.5 K/uL   RBC 3.14 (L) 4.22 - 5.81 MIL/uL   Hemoglobin 9.7 (L) 13.0 - 17.0 g/dL   HCT 28.9 (L) 39.0 - 52.0 %   MCV 92.0 78.0 - 100.0 fL   MCH 30.9 26.0 - 34.0 pg   MCHC 33.6 30.0 - 36.0 g/dL   RDW 18.3 (H) 11.5 - 15.5 %   Platelets 227 150 - 400 K/uL    Comment: Performed at [facility], [place] [address], [place], [place] [address]  Heparin level (unfractionated)     Status: None   Collection Time: 02/18/18  3:20 AM  Result Value Ref Range   Heparin Unfractionated 0.39 0.30 - 0.70 IU/mL    Comment:        IF HEPARIN RESULTS ARE BELOW EXPECTED VALUES, AND PATIENT DOSAGE HAS BEEN CONFIRMED, SUGGEST FOLLOW UP TESTING OF ANTITHROMBIN III LEVELS. Performed at [facility], [place] [address], [place], [place] [address]   Comprehensive metabolic panel     Status: Abnormal   Collection Time: 02/18/18  3:20 AM  Result Value Ref Range   Sodium 139 135 - 145 mmol/L   Potassium 3.4 (L) 3.5 - 5.1 mmol/L   Chloride 107 101 - 111 mmol/L   CO2 22 22 - 32 mmol/L   Glucose, Bld 113 (H) 65 - 99 mg/dL   BUN 34 (H) 6 - 20 mg/dL   Creatinine, Ser 0.93 0.61 - 1.24 mg/dL   Calcium 7.8 (L) 8.9 - 10.3 mg/dL   Total Protein 5.4 (L) 6.5 - 8.1 g/dL   Albumin 2.2 (L) 3.5 - 5.0 g/dL   AST 40 15 - 41 U/L   ALT 25 17 - 63 U/L   Alkaline Phosphatase 55 38 - 126 U/L   Total Bilirubin 1.0 0.3 - 1.2 mg/dL   GFR calc non Af Amer >60 >60 mL/min   GFR calc Af Amer >60 >60 mL/min    Comment: (NOTE) The eGFR has been calculated using the CKD EPI equation. This calculation has not been validated in all clinical  situations. eGFR's persistently <60 mL/min signify possible Chronic Kidney Disease.    Anion gap 10 5 - 15    Comment: Performed at [facility], [place] [address] [place], [place] [address]  Magnesium     Status: None   Collection Time: 02/18/18  3:20 AM  Result Value Ref Range   Magnesium 2.0 1.7 - 2.4 mg/dL    Comment: Performed at [facility], [place] [address], [place], [place] [address]  Procalcitonin     Status: None   Collection Time: 02/18/18  3:20 AM  Result Value Ref Range   Procalcitonin 4.39 ng/mL    Comment:        Interpretation:  PCT > 2 ng/mL: Systemic infection (sepsis) is likely, unless other causes are known. (NOTE)       Sepsis PCT Algorithm           Lower Respiratory Tract                                      Infection PCT Algorithm    ----------------------------     ----------------------------         PCT < 0.25 ng/mL                PCT < 0.10 ng/mL         Strongly encourage             Strongly discourage   discontinuation of antibiotics    initiation of antibiotics    ----------------------------     -----------------------------       PCT 0.25 - 0.50 ng/mL            PCT 0.10 - 0.25 ng/mL               OR       >80% decrease in PCT            Discourage initiation of                                            antibiotics      Encourage discontinuation           of antibiotics    ----------------------------     -----------------------------         PCT >= 0.50 ng/mL              PCT 0.26 - 0.50 ng/mL               AND       <80% decrease in PCT              Encourage initiation of                                             antibiotics       Encourage continuation           of antibiotics    ----------------------------     -----------------------------        PCT >= 0.50 ng/mL                  PCT > 0.50 ng/mL               AND         increase in PCT                  Strongly encourage                                       initiation of antibiotics    Strongly encourage escalation           of antibiotics                                     -----------------------------  PCT <= 0.25 ng/mL                                                 OR                                        > 80% decrease in PCT                                     Discontinue / Do not initiate                                             antibiotics Performed at [facility] [address] [place], [place] [address]     Dg Chest Port 1 View  Result Date: 02/17/2018 CLINICAL DATA:  Respiratory failure EXAM: PORTABLE CHEST 1 VIEW COMPARISON:  February 16, 2018 chest radiograph and chest CT February 10, 2018 FINDINGS: Port-A-Cath tip is in the superior vena cava. No pneumothorax. Ovoid opacity along the right minor fissure is stable and felt to represent loculated fluid. Elsewhere there is interstitial edema with pleural effusions bilaterally, left larger than right, stable. There is bibasilar atelectasis. There are scattered calcified granulomas on the right. There is a skin fold on the right. No pneumothorax appreciable. Heart size is upper normal with pulmonary vascularity showing evidence of pulmonary venous hypertension. There is aortic atherosclerosis. No adenopathy. No evident bone lesions. IMPRESSION: Pulmonary vascular congestion with pulmonary edema, felt to represent a degree of congestive heart failure. Probable loculated effusion along the right minor fissure. Pleural effusions bilaterally, left larger than right, stable. No change in Port-A-Cath position. No pneumothorax. There is aortic atherosclerosis. Aortic Atherosclerosis (ICD10-I70.0). Electronically Signed   By: Lowella Grip III M.D.   On: 02/17/2018 07:05    Review of Systems  Unable to perform ROS: Language  Constitutional:       Pt speaks some English but not conversational.  I explained plan  to place tube back in site and he appeared to understand.    Eyes: Negative for double vision.   Blood pressure 128/64, pulse 84, temperature 97.6 F (36.4 C), temperature source Oral, resp. rate 18, height _0  (1.753 m), weight 57.3 kg (126 lb 5.2 oz), SpO2 91 %. Physical Exam  Constitutional: He is oriented to person, place, and time. No distress.  Frail elderly [place] male with stage IV gastric adenocarcinoma.  In bed and fairly comfortable.  He speaks some limited [place].    HENT:  Head: Normocephalic and atraumatic.  Mouth/Throat: No oropharyngeal exudate.  Eyes: Right eye exhibits no discharge. Left eye exhibits no discharge. No scleral icterus.  Pupils are equal  Neck: Normal range of motion. Neck supple. No JVD present. No tracheal deviation present. No thyromegaly present.  Cardiovascular: Normal rate, regular rhythm, normal heart sounds and intact distal pulses.  No murmur heard. Respiratory: No respiratory distress. He has no wheezes. He has no rales (few in base, left more than right ,decreased BS left base more than right.). He exhibits no tenderness.  Left  portacath in place  GI: Soft. Bowel sounds are normal. He exhibits no distension and no mass. There is tenderness (tender around J tube site that is leaking with a localized cellulitis around the site.  ). There is no rebound and no guarding.  G - tube site looks fine, fluid in G tube cap. J - tube site open and draining, more today than yesterday per the nursing staff.  Green fluid coming from the site, it is clear and is not purulent.  He has cellulitis and erythema around the site of the J tube.   Midline surgical scar well healed   Genitourinary:  Genitourinary Comments: Condom cath in place  Musculoskeletal: He exhibits no edema or tenderness.  Lymphadenopathy:    He has no cervical adenopathy.  Neurological: He is alert and oriented to person, place, and time. No cranial nerve deficit.  Skin: Skin is warm and  dry. No rash noted. He is not diaphoretic. There is erythema (around the leaking J tube site).  Psychiatric: He has a normal mood and affect. His behavior is normal. Judgment and thought content normal.    Assessment/Plan: Leaking J tube site after loss of the J-tube Localized abdominal wall cellulitis around the site of the leaking J tube S/p laparoscopic biopsy, gastrojejunostomy, insertion of G tube and J-Tube, 49/67/59, Dr. [name] Stage IV gastric adenocarcinoma - on Palliative chemotherapy Anterior STEMI - Medical management Sepsis with Community acquired bilateral pneumonia  DNR  Plan:  We reinserted a #14 Fr. Red Robbin catheter into the site. It went in without any resistance.   I secured it with a single 3-0 Proline stitch.  We cleaned the site with soap and water before insertion of the catheter, prepped the site with betadine, and use 2 ml of 2% plain lidocaine to anesthetize suture site.  I picked an area that was close to the opening, with minimal cellulitis.  We dressed area with Barrier Cream, and dry sterile dressing.  Will watch and see how he does with this.  Pt is on both Vancomycin and Zosyn which should cover his cellulitis very well.   I rechecked the site after the tube was placed and used the phone translator to discuss plan and answer question, and his wife had.     Suleyma Wafer 02/18/2018, 10:25 AM

## 2018-02-18 NOTE — Consult Note (Signed)
[facility] Nurse wound consult note Reason for Consult: distal tube peritube leakage contributing to chemical irritant dermatitis. Tube changed today by WIll Creig Hines, CCS PA. See his note. Wound type: MASD, chemical dermatitis. Pressure Injury POA: NA Measurement:Denuded area measures 5cm x 4cm x 0.1cm Wound bed: partial thickness, moist Drainage (amount, consistency, odor) serous in small amount.  Decreased drainage from tube since new one inserted according to patient, family and Bedside RN. Periwound: Intact. Dressing procedure/placement/frequency:I have provided the staff with conservative orders for skin protection using triple paste (Zinc oxide) as a skin barrier and instructing them to monitor closely, reapplying every 6 hours until tissue is repaired and regenerated.  [facility] nursing team will not follow, but will remain available to this patient, the nursing and medical teams.  Please re-consult if needed. Thanks, Maudie Flakes, MSN, RN, [place], Arther Abbott  Pager# [phone]

## 2018-02-19 DIAGNOSIS — A419 Sepsis, unspecified organism: Secondary | ICD-10-CM

## 2018-02-19 DIAGNOSIS — R6521 Severe sepsis with septic shock: Secondary | ICD-10-CM

## 2018-02-19 LAB — CBC
HEMATOCRIT: 29.4 % — AB (ref 39.0–52.0)
HEMOGLOBIN: 9.9 g/dL — AB (ref 13.0–17.0)
MCH: 31.1 pg (ref 26.0–34.0)
MCHC: 33.7 g/dL (ref 30.0–36.0)
MCV: 92.5 fL (ref 78.0–100.0)
Platelets: 242 10*3/uL (ref 150–400)
RBC: 3.18 MIL/uL — ABNORMAL LOW (ref 4.22–5.81)
RDW: 17.8 % — ABNORMAL HIGH (ref 11.5–15.5)
WBC: 6.7 10*3/uL (ref 4.0–10.5)

## 2018-02-19 LAB — COMPREHENSIVE METABOLIC PANEL
ALK PHOS: 58 U/L (ref 38–126)
ALT: 32 U/L (ref 17–63)
AST: 44 U/L — ABNORMAL HIGH (ref 15–41)
Albumin: 2.2 g/dL — ABNORMAL LOW (ref 3.5–5.0)
Anion gap: 7 (ref 5–15)
BILIRUBIN TOTAL: 0.8 mg/dL (ref 0.3–1.2)
BUN: 30 mg/dL — ABNORMAL HIGH (ref 6–20)
CALCIUM: 7.9 mg/dL — AB (ref 8.9–10.3)
CO2: 25 mmol/L (ref 22–32)
Chloride: 105 mmol/L (ref 101–111)
Creatinine, Ser: 1 mg/dL (ref 0.61–1.24)
Glucose, Bld: 144 mg/dL — ABNORMAL HIGH (ref 65–99)
Potassium: 3.5 mmol/L (ref 3.5–5.1)
Sodium: 137 mmol/L (ref 135–145)
TOTAL PROTEIN: 5.5 g/dL — AB (ref 6.5–8.1)

## 2018-02-19 LAB — CULTURE, BLOOD (ROUTINE X 2)
Culture: NO GROWTH
Culture: NO GROWTH
SPECIAL REQUESTS: ADEQUATE
SPECIAL REQUESTS: ADEQUATE

## 2018-02-19 LAB — GLUCOSE, CAPILLARY
GLUCOSE-CAPILLARY: 132 mg/dL — AB (ref 65–99)
GLUCOSE-CAPILLARY: 143 mg/dL — AB (ref 65–99)
GLUCOSE-CAPILLARY: 144 mg/dL — AB (ref 65–99)
Glucose-Capillary: 138 mg/dL — ABNORMAL HIGH (ref 65–99)
Glucose-Capillary: 151 mg/dL — ABNORMAL HIGH (ref 65–99)

## 2018-02-19 LAB — PHOSPHORUS: PHOSPHORUS: 2.4 mg/dL — AB (ref 2.5–4.6)

## 2018-02-19 LAB — MAGNESIUM: Magnesium: 2 mg/dL (ref 1.7–2.4)

## 2018-02-19 LAB — PROCALCITONIN: Procalcitonin: 2.46 ng/mL

## 2018-02-19 MED ORDER — FLUCONAZOLE 100 MG PO TABS
100.0000 mg | ORAL_TABLET | Freq: Every day | ORAL | Status: DC
  Administered 2018-02-20 – 2018-02-21 (×2): 100 mg via ORAL
  Filled 2018-02-19 (×2): qty 1

## 2018-02-19 MED ORDER — ACETAMINOPHEN 325 MG PO TABS
650.0000 mg | ORAL_TABLET | Freq: Three times a day (TID) | ORAL | Status: DC
  Administered 2018-02-19 – 2018-02-21 (×6): 650 mg via ORAL
  Filled 2018-02-19 (×6): qty 2

## 2018-02-19 MED ORDER — POTASSIUM CHLORIDE CRYS ER 20 MEQ PO TBCR
40.0000 meq | EXTENDED_RELEASE_TABLET | Freq: Once | ORAL | Status: AC
  Administered 2018-02-19: 40 meq via ORAL
  Filled 2018-02-19: qty 2

## 2018-02-19 MED ORDER — ZINC OXIDE 12.8 % EX OINT
TOPICAL_OINTMENT | CUTANEOUS | Status: DC | PRN
  Filled 2018-02-19: qty 56.7

## 2018-02-19 MED ORDER — LOPERAMIDE HCL 2 MG PO CAPS
2.0000 mg | ORAL_CAPSULE | Freq: Once | ORAL | Status: AC
  Administered 2018-02-19: 2 mg via ORAL
  Filled 2018-02-19: qty 1

## 2018-02-19 MED ORDER — FLUCONAZOLE 100 MG PO TABS
100.0000 mg | ORAL_TABLET | Freq: Every day | ORAL | Status: DC
  Administered 2018-02-19: 100 mg
  Filled 2018-02-19: qty 1

## 2018-02-19 MED ORDER — SODIUM CHLORIDE 0.9 % IV SOLN
1.0000 g | INTRAVENOUS | Status: DC
  Administered 2018-02-19 – 2018-02-21 (×3): 1 g via INTRAVENOUS
  Filled 2018-02-19 (×3): qty 1

## 2018-02-19 MED ORDER — ACETAMINOPHEN 325 MG PO TABS
650.0000 mg | ORAL_TABLET | Freq: Three times a day (TID) | ORAL | Status: DC
  Administered 2018-02-19: 650 mg via JEJUNOSTOMY
  Filled 2018-02-19: qty 2

## 2018-02-19 NOTE — Progress Notes (Signed)
Progress Note  Patient Name: Lee Pratt Date of Encounter: 02/19/2018  Primary Cardiologist: Sherren Mocha, MD   Subjective   Patient without complaints this AM. Denies feeling heart racing last evening. Denies CP or SOB.   Inpatient Medications    Scheduled Meds: . aspirin EC  81 mg Oral Daily  . atorvastatin  40 mg Oral q1800  . Chlorhexidine Gluconate Cloth  6 each Topical Q0600  . Chlorhexidine Gluconate Cloth  6 each Topical Daily  . clopidogrel  75 mg Oral Daily  . enoxaparin (LOVENOX) injection  40 mg Subcutaneous Q24H  . feeding supplement (ENSURE ENLIVE)  237 mL Oral TID BM  . lisinopril  2.5 mg Oral Daily  . metoprolol tartrate  37.5 mg Oral BID  . Zinc Oxide   Topical QID   Continuous Infusions: . sodium chloride 250 mL (02/18/18 0000)  . feeding supplement (OSMOLITE 1.5 CAL) 1,000 mL (02/19/18 0340)  . norepinephrine (LEVOPHED) Adult infusion Stopped (02/15/18 1230)  . piperacillin-tazobactam (ZOSYN)  IV 3.375 g (02/19/18 0559)  . vancomycin Stopped (02/18/18 1825)   PRN Meds: sodium chloride, acetaminophen **OR** acetaminophen, HYDROcodone-acetaminophen, lidocaine, ondansetron **OR** ondansetron (ZOFRAN) IV, prochlorperazine, sodium chloride flush, zolpidem   Vital Signs    Vitals:   02/18/18 2047 02/18/18 2239 02/18/18 2240 02/19/18 0341  BP: 105/72 113/68  104/65  Pulse: 83  87 74  Resp: 16   16  Temp: 97.6 F (36.4 C)   98.1 F (36.7 C)  TempSrc: Oral   Oral  SpO2: 98%   97%  Weight:    116 lb 13.5 oz (53 kg)  Height:        Intake/Output Summary (Last 24 hours) at 02/19/2018 0954 Last data filed at 02/19/2018 0600 Gross per 24 hour  Intake 1405.16 ml  Output 2075 ml  Net -669.84 ml   Filed Weights   02/17/18 0916 02/18/18 0556 02/19/18 0341  Weight: 128 lb 12 oz (58.4 kg) 126 lb 5.2 oz (57.3 kg) 116 lb 13.5 oz (53 kg)    Telemetry    NSR with ~10 minute episode of SVT with rate to 140s around 6:30pm 02/18/18 - Personally  Reviewed  Physical Exam   GEN: Elderly frail gentleman laying in bed in no acute distress.  Neck: No JVD, no carotid bruits Cardiac: RRR, no murmurs, rubs, or gallops.  Respiratory: mild bilateral rhonchi with decreased breath sounds in LLL, no wheezes/ rales appreciated anteriorly. GI: NABS, Soft, nontender, non-distended. J tube in place. MS: No edema; No deformity. Neuro:  Nonfocal, moving all extremities spontaneously Psych: Normal affect  Chest: L upper chest port-a-cath in place - dressing C/D/I  Labs    Chemistry Recent Labs  Lab 02/14/18 1712  02/17/18 0446 02/18/18 0320 02/19/18 0315  NA 139   < > 140 139 137  K 4.8   < > 3.6 3.4* 3.5  CL 105   < > 109 107 105  CO2 16*   < > 24 22 25   GLUCOSE 117*   < > 123* 113* 144*  BUN 37*   < > 35* 34* 30*  CREATININE 1.04   < > 1.09 0.93 1.00  CALCIUM 8.6*   < > 8.1* 7.8* 7.9*  PROT 6.8  --   --  5.4* 5.5*  ALBUMIN 3.0*  --   --  2.2* 2.2*  AST 44*  --   --  40 44*  ALT 31  --   --  25 32  ALKPHOS 94  --   --  55 58  BILITOT 0.6  --   --  1.0 0.8  GFRNONAA >60   < > >60 >60 >60  GFRAA >60   < > >60 >60 >60  ANIONGAP 18*   < > 7 10 7    < > = values in this interval not displayed.     Hematology Recent Labs  Lab 02/17/18 0446 02/18/18 0320 02/19/18 0315  WBC 10.5 9.2 6.7  RBC 3.28* 3.14* 3.18*  HGB 10.0* 9.7* 9.9*  HCT 30.1* 28.9* 29.4*  MCV 91.8 92.0 92.5  MCH 30.5 30.9 31.1  MCHC 33.2 33.6 33.7  RDW 18.5* 18.3* 17.8*  PLT 244 227 242    Cardiac Enzymes Recent Labs  Lab 02/15/18 0352 02/15/18 1125 02/16/18 0357 02/17/18 0446  TROPONINI 16.47* 21.08* 14.54* 7.00*    Recent Labs  Lab 02/14/18 1729  TROPIPOC 0.04     BNPNo results for input(s): BNP, PROBNP in the last 168 hours.   DDimer No results for input(s): DDIMER in the last 168 hours.   Radiology    Dg Chest 2 View  Result Date: 02/18/2018 CLINICAL DATA:  Follow-up pneumonia. Gastric cancer. Altered mental status. Hypertension and  tachycardia. EXAM: CHEST  2 VIEW COMPARISON:  02/17/2018 FINDINGS: Left Port-A-Cath which terminates at the high SVC. Midline trachea. Normal heart size. Atherosclerosis in the transverse aorta. Small bilateral pleural effusions, larger on the left. Interstitial and airspace disease is lower lobe predominant and greater on the left. IMPRESSION: Slightly improved aeration, at least partially felt to be due to decreased pulmonary edema. Concurrent infection, especially within the left lower lobe, cannot be excluded. Small bilateral pleural effusions. Aortic Atherosclerosis (ICD10-I70.0). Electronically Signed   By: Abigail Miyamoto M.D.   On: 02/18/2018 11:22    Cardiac Studies   Echo 02/15/18: Study Conclusions  - Left ventricle: The cavity size was normal. There was mild   concentric hypertrophy. Systolic function was moderately reduced.   The estimated ejection fraction was in the range of 35% to 40%.   Anterior, anteroseptal, apical and inferoapical severe   hypokinesis to akinesis, suggestive of LAD territory   ischemia/infarct. Doppler parameters are consistent with   pseudonormal left ventricular relaxation (grade 2 diastolic   dysfunction). LV filling pressure is elevated. - Aortic valve: Mildly calcified with mild stenosis. There was mild   regurgitation. Valve area (Vmax): 1.67 cm^2. - Mitral valve: Mildly thickened leaflets . There was mild   regurgitation. - Left atrium: The atrium was normal in size. - Right ventricle: The cavity size was mildly dilated. Mild   hypokinesis. - Tricuspid valve: There was mild regurgitation. - Pulmonary arteries: PA peak pressure: 47 mm Hg (S). - Inferior vena cava: The vessel was dilated. The respirophasic   diameter changes were blunted (< 50%), consistent with elevated   central venous pressure.  Impressions:  - Compared to a prior study in 2012, there has been a significant   reduction in LVEF to 35-40% with severe LAD territory wall  motion   abnormality which is new, suggestive of ischemia or infarct -   there is moderate diastolic dysfunction with elevated LV filling   pressure.  Patient Profile   84 y.o. male  With metastatic gastric cancer , HTN admitted 02/14 with weakness , chills.   Found to have ST elevation anterior MI. Not a cath candidate due to metastatic cancer  Assessment & Plan    1. STEMI: anterior ST elevations now resolved. Trop peaked at 21.08, downtrended to 7.  Not a candidate for cath due to advanced metastatic cancer. Patient is without chest pain complaints.  - Continue medical management with ASA and BBlocker  2. SVT: patient with ~10 minute episode around 6:30pm 02/18/18 with rate up to 140s. Patient was asymptomatic. - Metoprolol increased to 37.5mg  BID   - Agree with increased dose   3. Ischemic DCM: Echo with EF 35-40% and new severe LAD territory wall motion abnormalities with moderate diastolic dysfunction.  - s/p IV lasix 20mg  02/18/18 - K 3.5 today. Will give 40 mEq K today. - Continue Bblocker, ACEi  4. Gastric Cancer: chemo on hold - Continue care per primary team  For questions or updates, please contact [place] Please consult [email or web address] for contact info under Cardiology/STEMI.      Signed, Abigail Butts, PA-C  02/19/2018, 9:54 AM   [phone]  Patient examined chart reviewed Rhythm stable now He is elderly [place] male with metastatic CA Anterior MI with resolution of ST elevation and decreasing troponin No CHF basilar atelectasis no murmur on exam continue ASA and beta blocker  Jenkins Rouge

## 2018-02-19 NOTE — Evaluation (Signed)
Physical Therapy Evaluation Patient Details Name: Lee Pratt MRN: [id number] DOB: Apr 08, 1934 Today's Date: 02/19/2018   History of Present Illness  84 yo male admitted with gastric outlet obstruction. Found to have gastric adenocarcinoma. S/P gastrojejunostomy, G tube placement, J tube placement 11/12/17. Admitted 02/14/18 with MI, PNA and J tube site wound infection.   Clinical Impression  Pt admitted with above diagnosis. Pt currently with functional limitations due to the deficits listed below (see PT Problem List). Pt ambulated 6' with RW and min/guard assist, distance limited by PT 2* leaking flexiseal (pt can likely ambulate significantly farther). HR 80s with activity. Instructed pt in seated BLE strengthening exercises. Pt will benefit from skilled PT to increase their independence and safety with mobility to allow discharge to the venue listed below.       Follow Up Recommendations Home health PT    Equipment Recommendations  None recommended by PT    Recommendations for Other Services       Precautions / Restrictions Precautions Precautions: Other (comment) Precaution Comments: G tube, flexiseal leaks, denies h/o falls in past 1 year Restrictions Weight Bearing Restrictions: No      Mobility  Bed Mobility Overal bed mobility: Modified Independent             General bed mobility comments: HOB up, used rail  Transfers Overall transfer level: Needs assistance Equipment used: Rolling walker (2 wheeled) Transfers: Sit to/from Stand Sit to Stand: Min guard         General transfer comment: VCs hand placement, min/guard safety  Ambulation/Gait Ambulation/Gait assistance: Min guard Ambulation Distance (Feet): 6 Feet Assistive device: Rolling walker (2 wheeled) Gait Pattern/deviations: Step-through pattern;Decreased stride length     General Gait Details: distance limited by leaking flexiseal, RN/NT aware; HR 80s with activity  Stairs             Wheelchair Mobility    Modified Rankin (Stroke Patients Only)       Balance Overall balance assessment: Modified Independent                                           Pertinent Vitals/Pain Pain Assessment: No/denies pain    Home Living Family/patient expects to be discharged to:: Private residence Living Arrangements: Spouse/significant other Available Help at Discharge: Family;Available 24 hours/day Type of Home: Apartment Home Access: Level entry     Home Layout: One level Home Equipment: Walker - 2 wheels;Cane - single point      Prior Function Level of Independence: Independent with assistive device(s)         Comments: uses RW for ambulation, independent with ADLs     Hand Dominance        Extremity/Trunk Assessment   Upper Extremity Assessment Upper Extremity Assessment: Overall WFL for tasks assessed    Lower Extremity Assessment Lower Extremity Assessment: Overall WFL for tasks assessed    Cervical / Trunk Assessment Cervical / Trunk Assessment: Normal  Communication   Communication: Prefers language other than English;HOH(can communicate in basic English)  Cognition Arousal/Alertness: Awake/alert Behavior During Therapy: WFL for tasks assessed/performed Overall Cognitive Status: Within Functional Limits for tasks assessed                                        General Comments  Exercises General Exercises - Lower Extremity Ankle Circles/Pumps: AROM;Both;10 reps;Seated Long Arc Quad: AROM;10 reps;Both;Seated Hip Flexion/Marching: AROM;10 reps;Both;Seated   Assessment/Plan    PT Assessment Patient needs continued PT services  PT Problem List Decreased mobility;Decreased activity tolerance       PT Treatment Interventions Gait training;Therapeutic activities;Therapeutic exercise;Patient/family education    PT Goals (Current goals can be found in the Care Plan section)  Acute Rehab PT  Goals Patient Stated Goal: likes to walk outside PT Goal Formulation: With patient Time For Goal Achievement: 03/05/18 Potential to Achieve Goals: Good    Frequency Min 3X/week   Barriers to discharge        Co-evaluation               AM-PAC PT "6 Clicks" Daily Activity  Outcome Measure Difficulty turning over in bed (including adjusting bedclothes, sheets and blankets)?: A Little Difficulty moving from lying on back to sitting on the side of the bed? : A Little Difficulty sitting down on and standing up from a chair with arms (e.g., wheelchair, bedside commode, etc,.)?: A Little Help needed moving to and from a bed to chair (including a wheelchair)?: A Little Help needed walking in hospital room?: A Little Help needed climbing 3-5 steps with a railing? : A Lot 6 Click Score: 17    End of Session Equipment Utilized During Treatment: Gait belt Activity Tolerance: Patient tolerated treatment well;Other (comment)(limited by leaking flexiseal) Patient left: in chair;with call bell/phone within reach;with family/visitor present Nurse Communication: Mobility status;Other (comment)(flexiseal leaking) PT Visit Diagnosis: Difficulty in walking, not elsewhere classified (R26.2)    Time: 1031-1100 PT Time Calculation (min) (ACUTE ONLY): 29 min   Charges:   PT Evaluation $PT Eval Low Complexity: 1 Low PT Treatments $Gait Training: 8-22 mins   PT G Codes:          Philomena Doheny 02/19/2018, 11:19 AM [phone]

## 2018-02-19 NOTE — Consult Note (Signed)
84 yo with metastatic gastric cancer in with AMI and sepsis. I had a goals of care conversation with patient, his son and his wife. He lives here in [place] with his wife, very little support here but his son is going to take off work to help. I discussed his prognosis in the setting of this hospitalization. I introduced the concept of hospice care-they are very interested in this if it will help support him at home and avoid SNF. I will touch base with Dr. Burr Medico in AM to discuss prognosis and future treatment options- risk benefit.   Summary of Recommendations:  1. DNR, confirmed 2. Determine future tx and if he is hospice referral appropriate 3. Treat his oral thrush 4. He still has a rectal tube -this needs to be removed prior to discharge- we need to consider source of diarrhea-abx?CID?C. Diff negative- fecal incontinence is concerning for disease progression and further chemotherapy related side effects. Tube feeding/malabsorption may also be an issue. 5. Cardiac issues are stable and few reasonable options for intervention in his current state. 6. Maximize mobility and independence. 7. 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 Will schedule Tylenol.  Lane Hacker, DO Palliative Medicine [phone]

## 2018-02-19 NOTE — Care Management Important Message (Signed)
Important Message  Patient Details  Name: Ronith Berti MRN: [id number] Date of Birth: Apr 30, 1934   Medicare Important Message Given:  Yes    Kerin Salen 02/19/2018, 12:42 [place] Message  Patient Details  Name: Kesean Serviss MRN: [id number] Date of Birth: 07-27-1934   Medicare Important Message Given:  Yes    Kerin Salen 02/19/2018, 12:42 PM

## 2018-02-19 NOTE — Progress Notes (Signed)
PROGRESS NOTE    Lee Pratt  QIW:[id number] DOB: 24-Dec-1934 DOA: 02/14/2018 PCP: [name], MD   Brief Narrative: Lee Pratt y.o.male,of [place] descent, with past medical history significant for gastric cancer on treatment ,hypertension and hyperglycemia who was sent from the cancer center for evaluation of altered mental status, hypotension and tachycardia. His J tube also found to have fallen. Patient also had a G tube which was working.In the emergency room, the patient was noted to have ST elevations and had a temperature of 102. Cardiology was consulted and they advised starting the patient on heparin drip.  His blood pressure continued dropping and he was started on dopamine drip in addition to IV fluids.  Was also suspected to have sepsis with bilateral pneumonia on presentation Patient was admitted to ICU for the management of hypotension and STEMI.  He was transferred back to hospital service on 02/17/18.    Assessment & Plan:   Principal Problem:   Acute anterior wall MI ([place]) Active Problems:   AMI (acute myocardial infarction) ([place])   Acute myocardial infarction ([place])   Pressure injury of skin   Protein-calorie malnutrition, severe   HCAP (healthcare-associated pneumonia)  STEMI: Being treated medically.Not a candidate for cath.  Heparin drip stopped.  Currently on aspirin, beta-blocker and Plavix.  Cardiology was following. Patient denies any chest pain at present.  He will follow-up with cardiology as an outpatient.  Pneumonia: Sputum culture showed Klebsiella pneumonia.  Repeat chest x-ray shows small bilateral pleural effusion, left lower lobe pneumonia.  He was started on vancomycin and zosyn on 02/14/18.  Respiratory status is currently stable.  Antibiotics changed to ceftriaxone today. Patient was also evaluated by speech therapy for suspicion of aspiration pneumonia.  Has been recommended regular solid/thin liquid.  Gastric Adenocarcionma with peritoneal  metastasis s/p gastrojejunostomy and PEG placement: s/p cycle 5 of folfox Follows with Dr. Burr Medico.  J tube Site Wound Infection: Sugery was following now signed off. Was started on broad spectrum abx.  Currently on ceftriaxone . Redness in the J-tube site is already getting better.  Patient is on tube feedings through G-tube.  HFrEF:  EF 35-40%.  On ACE inhibitors, beta-blockers.  Patient will follow up with cardiology after discharge.  SVT: Had an episode of SVT yesterday.  Currently patient is a symptomatic.  Metoprolol increased to 37.5 mg twice daily.  Iron deficiency anemia: Currently H&H is stable  Hypokalemia: Supplemented .  Debility deconditioning :Patient evaluated by physical therapy and recommended home health on discharge.      DVT prophylaxis: Lovenox Code Status: DNR Family Communication: Son present at the bedside Disposition Plan: Home tomorrow   Consultants: Cardiology, general surgery  Procedures: None  Antimicrobials: Ceftriaxone since 01/2618 Vancomycin and Zosyn since 02/14/18-02/18/18  Subjective: Patient seen and examined the bedside this morning.  Remains comfortable.  No new issues/events.  Heart rate is stable this morning.  Objective: Vitals:   02/18/18 2239 02/18/18 2240 02/19/18 0341 02/19/18 1026  BP: 113/68  104/65 (!) 108/59  Pulse:  87 74   Resp:   16   Temp:   98.1 F (36.7 C)   TempSrc:   Oral   SpO2:   97%   Weight:   53 kg (116 lb 13.5 oz)   Height:        Intake/Output Summary (Last 24 hours) at 02/19/2018 1554 Last data filed at 02/19/2018 0600 Gross per 24 hour  Intake 1405.16 ml  Output 1200 ml  Net 205.16 ml  Filed Weights   02/17/18 0916 02/18/18 0556 02/19/18 0341  Weight: 58.4 kg (128 lb 12 oz) 57.3 kg (126 lb 5.2 oz) 53 kg (116 lb 13.5 oz)    Examination:  General exam: Appears calm and comfortable ,Not in distress,cachetic HEENT:PERRL,Oral mucosa moist, Ear/Nose normal on gross exam Respiratory system:  Bilateral equal air entry, normal vesicular breath sounds, no wheezes or crackles  Cardiovascular system: S1 & S2 heard, RRR. No JVD, murmurs, rubs, gallops or clicks. No pedal edema, Port a cath on the left chest Gastrointestinal system: Abdomen is nondistended, soft and nontender. No organomegaly or masses felt. Normal bowel sounds heard. G tube on place Central nervous system: Alert and oriented. No focal neurological deficits. Extremities: No edema, no clubbing ,no cyanosis, distal peripheral pulses palpable. Skin: No rashes, lesions or ulcers,no icterus ,no pallor MSK: Normal muscle bulk,tone ,power Psychiatry: Judgement and insight appear normal. Mood & affect appropriate.     Data Reviewed: I have personally reviewed following labs and imaging studies  CBC: Recent Labs  Lab 02/14/18 1712 02/15/18 0420 02/16/18 0357 02/17/18 0446 02/18/18 0320 02/19/18 0315  WBC 6.1 7.1 8.1 10.5 9.2 6.7  NEUTROABS 4.6  --   --   --   --   --   HGB 11.5* 10.0* 10.1* 10.0* 9.7* 9.9*  HCT 34.6* 29.1* 29.8* 30.1* 28.9* 29.4*  MCV 92.8 90.1 90.6 91.8 92.0 92.5  PLT 395 260 212 244 227 416   Basic Metabolic Panel: Recent Labs  Lab 02/15/18 0420 02/16/18 0357 02/17/18 0446 02/18/18 0320 02/19/18 0315  NA 142 139 140 139 137  K 3.6 2.7* 3.6 3.4* 3.5  CL 115* 110 109 107 105  CO2 17* 20* 24 22 25   GLUCOSE 108* 107* 123* 113* 144*  BUN 30* 25* 35* 34* 30*  CREATININE 0.96 1.00 1.09 0.93 1.00  CALCIUM 7.4* 7.7* 8.1* 7.8* 7.9*  MG  --  2.1 2.1 2.0 2.0  PHOS  --  2.5 1.9*  --  2.4*   GFR: Estimated Creatinine Clearance: 42.7 mL/min (by C-G formula based on SCr of 1 mg/dL). Liver Function Tests: Recent Labs  Lab 02/14/18 1712 02/18/18 0320 02/19/18 0315  AST 44* 40 44*  ALT 31 25 32  ALKPHOS 94 55 58  BILITOT 0.6 1.0 0.8  PROT 6.8 5.4* 5.5*  ALBUMIN 3.0* 2.2* 2.2*   No results for input(s): LIPASE, AMYLASE in the last 168 hours. No results for input(s): AMMONIA in the last 168  hours. Coagulation Profile: Recent Labs  Lab 02/14/18 1712  INR 1.10   Cardiac Enzymes: Recent Labs  Lab 02/15/18 0032 02/15/18 0352 02/15/18 1125 02/16/18 0357 02/17/18 0446  TROPONINI 12.65* 16.47* 21.08* 14.54* 7.00*   BNP (last 3 results) No results for input(s): PROBNP in the last 8760 hours. HbA1C: No results for input(s): HGBA1C in the last 72 hours. CBG: Recent Labs  Lab 02/18/18 2043 02/18/18 2334 02/19/18 0338 02/19/18 0820 02/19/18 1214  GLUCAP 136* 135* 132* 143* 144*   Lipid Profile: No results for input(s): CHOL, HDL, LDLCALC, TRIG, CHOLHDL, LDLDIRECT in the last 72 hours. Thyroid Function Tests: No results for input(s): TSH, T4TOTAL, FREET4, T3FREE, THYROIDAB in the last 72 hours. Anemia Panel: No results for input(s): VITAMINB12, FOLATE, FERRITIN, TIBC, IRON, RETICCTPCT in the last 72 hours. Sepsis Labs: Recent Labs  Lab 02/14/18 2030 02/15/18 0032 02/15/18 0420 02/16/18 0357 02/16/18 0914 02/17/18 0446 02/18/18 0320 02/19/18 0315  PROCALCITON  --   --  17.60 11.65  --   --  4.39 2.46  LATICACIDVEN 3.74* 2.8*  --   --  1.1 1.3  --   --     Recent Results (from the past 240 hour(s))  Aerobic Culture (superficial specimen)     Status: None   Collection Time: 02/14/18  4:15 PM  Result Value Ref Range Status   Specimen Description   Final    WOUND LEFT ABDOMEN Performed at [facility], [place] [address], [place], [place] [address]    Special Requests   Final    NONE Performed at [facility], [place] [address], [place], [place] [address]    Gram Stain   Final    NO WBC SEEN ABUNDANT GRAM NEGATIVE RODS ABUNDANT GRAM POSITIVE RODS FEW GRAM POSITIVE COCCI IN PAIRS Performed at [facility], [place] [address], [place], [place] [address]    Culture   Final    ABUNDANT KLEBSIELLA PNEUMONIAE ABUNDANT SERRATIA MARCESCENS    Report Status 02/17/2018 FINAL  Final   Organism ID, Bacteria KLEBSIELLA PNEUMONIAE   Final   Organism ID, Bacteria SERRATIA MARCESCENS  Final      Susceptibility   Klebsiella pneumoniae - MIC*    AMPICILLIN >=32 RESISTANT Resistant     CEFAZOLIN <=4 SENSITIVE Sensitive     CEFEPIME <=1 SENSITIVE Sensitive     CEFTAZIDIME <=1 SENSITIVE Sensitive     CEFTRIAXONE <=1 SENSITIVE Sensitive     CIPROFLOXACIN <=0.25 SENSITIVE Sensitive     GENTAMICIN <=1 SENSITIVE Sensitive     IMIPENEM <=0.25 SENSITIVE Sensitive     TRIMETH/SULFA <=20 SENSITIVE Sensitive     AMPICILLIN/SULBACTAM 4 SENSITIVE Sensitive     PIP/TAZO <=4 SENSITIVE Sensitive     Extended ESBL NEGATIVE Sensitive     * ABUNDANT KLEBSIELLA PNEUMONIAE   Serratia marcescens - MIC*    CEFAZOLIN >=64 RESISTANT Resistant     CEFEPIME <=1 SENSITIVE Sensitive     CEFTAZIDIME <=1 SENSITIVE Sensitive     CEFTRIAXONE <=1 SENSITIVE Sensitive     CIPROFLOXACIN <=0.25 SENSITIVE Sensitive     GENTAMICIN <=1 SENSITIVE Sensitive     TRIMETH/SULFA <=20 SENSITIVE Sensitive     * ABUNDANT SERRATIA MARCESCENS  Culture, blood (Routine x 2)     Status: None   Collection Time: 02/14/18  5:12 PM  Result Value Ref Range Status   Specimen Description   Final    BLOOD PORTA CATH Performed at [place] [address], [place], [place] [address]    Special Requests   Final    BOTTLES DRAWN AEROBIC AND ANAEROBIC Blood Culture adequate volume Performed at [place] [address] [place], [place] [address]    Culture   Final    NO GROWTH 5 DAYS Performed at [facility], [place] [address] [place], [place] [address]    Report Status 02/19/2018 FINAL  Final  Culture, blood (Routine x 2)     Status: None   Collection Time: 02/14/18  5:12 PM  Result Value Ref Range Status   Specimen Description   Final    BLOOD LEFT WRIST Performed at [place] [address], [place], [place] [address]    Special Requests   Final    BOTTLES DRAWN AEROBIC AND ANAEROBIC Blood  Culture adequate volume Performed at [place] [address], [place],  [address]    Culture   Final    NO GROWTH 5 DAYS Performed at [facility], [place] [address], [place], [place] [address]    Report Status 02/19/2018 FINAL  Final  Urine culture     Status: None   Collection Time: 02/14/18  5:12 PM  Result Value Ref Range Status   Specimen Description   Final    URINE, CATHETERIZED Performed at [facility], [place] [address], [place], [place] [address]    Special Requests   Final    Immunocompromised Performed at [facility], [place] [address] [place], [place] [address]    Culture   Final    NO GROWTH Performed at [facility], [place] [address] [place], [place] [address]    Report Status 02/16/2018 FINAL  Final  C difficile quick scan w PCR reflex     Status: None   Collection Time: 02/14/18  6:40 PM  Result Value Ref Range Status   C Diff antigen NEGATIVE NEGATIVE Final   C Diff toxin NEGATIVE NEGATIVE Final   C Diff interpretation No C. difficile detected.  Final    Comment: Performed at [facility], [place] [address], [place], [place] [address]  Gastrointestinal Panel by PCR , Stool     Status: None   Collection Time: 02/14/18  6:40 PM  Result Value Ref Range Status   Campylobacter species NOT DETECTED NOT DETECTED Final   Plesimonas shigelloides NOT DETECTED NOT DETECTED Final   Salmonella species NOT DETECTED NOT DETECTED Final   Yersinia enterocolitica NOT DETECTED NOT DETECTED Final   Vibrio species NOT DETECTED NOT DETECTED Final   Vibrio cholerae NOT DETECTED NOT DETECTED Final   Enteroaggregative E coli (EAEC) NOT DETECTED NOT DETECTED Final   Enteropathogenic E coli (EPEC) NOT DETECTED NOT DETECTED Final   Enterotoxigenic E coli (ETEC) NOT DETECTED NOT DETECTED Final   Shiga like toxin producing E coli (STEC) NOT DETECTED NOT DETECTED Final   Shigella/Enteroinvasive E  coli (EIEC) NOT DETECTED NOT DETECTED Final   Cryptosporidium NOT DETECTED NOT DETECTED Final   Cyclospora cayetanensis NOT DETECTED NOT DETECTED Final   Entamoeba histolytica NOT DETECTED NOT DETECTED Final   Giardia lamblia NOT DETECTED NOT DETECTED Final   Adenovirus F40/41 NOT DETECTED NOT DETECTED Final   Astrovirus NOT DETECTED NOT DETECTED Final   Norovirus GI/GII NOT DETECTED NOT DETECTED Final   Rotavirus A NOT DETECTED NOT DETECTED Final   Sapovirus (I, II, IV, and V) NOT DETECTED NOT DETECTED Final    Comment: Performed at [facility], [place] [place], [place] [address]  Respiratory Panel by PCR     Status: None   Collection Time: 02/15/18  4:20 AM  Result Value Ref Range Status   Adenovirus NOT DETECTED NOT DETECTED Final   Coronavirus 229E NOT DETECTED NOT DETECTED Final   Coronavirus HKU1 NOT DETECTED NOT DETECTED Final   Coronavirus NL63 NOT DETECTED NOT DETECTED Final   Coronavirus OC43 NOT DETECTED NOT DETECTED Final   Metapneumovirus NOT DETECTED NOT DETECTED Final   Rhinovirus / Enterovirus NOT DETECTED NOT DETECTED Final   Influenza A NOT DETECTED NOT DETECTED Final   Influenza B NOT DETECTED NOT DETECTED Final   Parainfluenza Virus 1 NOT DETECTED NOT DETECTED Final   Parainfluenza Virus 2 NOT DETECTED NOT DETECTED Final   Parainfluenza Virus 3 NOT DETECTED NOT DETECTED Final   Parainfluenza Virus 4 NOT DETECTED NOT DETECTED Final   Respiratory Syncytial Virus NOT DETECTED NOT DETECTED Final   Bordetella pertussis NOT DETECTED NOT DETECTED  Final   Chlamydophila pneumoniae NOT DETECTED NOT DETECTED Final   Mycoplasma pneumoniae NOT DETECTED NOT DETECTED Final    Comment: Performed at [facility], [place] [address] [place], [place] [address]  MRSA PCR Screening     Status: None   Collection Time: 02/15/18 12:21 PM  Result Value Ref Range Status   MRSA by PCR NEGATIVE NEGATIVE Final    Comment:        The GeneXpert MRSA Assay  (FDA approved for NASAL specimens only), is one component of a comprehensive MRSA colonization surveillance program. It is not intended to diagnose MRSA infection nor to guide or monitor treatment for MRSA infections. Performed at [facility], [place] [address], [place], [place] [address]   Culture, expectorated sputum-assessment     Status: None   Collection Time: 02/15/18 11:47 PM  Result Value Ref Range Status   Specimen Description SPU  Final   Special Requests NONE  Final   Sputum evaluation   Final    THIS SPECIMEN IS ACCEPTABLE FOR SPUTUM CULTURE Performed at [facility], [place] [address], [place], [place] [address]    Report Status 02/16/2018 FINAL  Final  Culture, respiratory (NON-Expectorated)     Status: None   Collection Time: 02/15/18 11:47 PM  Result Value Ref Range Status   Specimen Description   Final    SPU Performed at [place] [address], [place], [place] [address]    Special Requests   Final    NONE Reflexed from [id number] Performed at [place] [address] [place], [place] [address]    Gram Stain   Final    MODERATE WBC PRESENT, PREDOMINANTLY PMN NO ORGANISMS SEEN Performed at [facility], [place] [address] [place], [place] [address]    Culture RARE KLEBSIELLA PNEUMONIAE  Final   Report Status 02/18/2018 FINAL  Final   Organism ID, Bacteria KLEBSIELLA PNEUMONIAE  Final      Susceptibility   Klebsiella pneumoniae - MIC*    AMPICILLIN RESISTANT Resistant     CEFAZOLIN <=4 SENSITIVE Sensitive     CEFEPIME <=1 SENSITIVE Sensitive     CEFTAZIDIME <=1 SENSITIVE Sensitive     CEFTRIAXONE <=1 SENSITIVE Sensitive     CIPROFLOXACIN <=0.25 SENSITIVE Sensitive     GENTAMICIN <=1 SENSITIVE Sensitive     IMIPENEM <=0.25 SENSITIVE Sensitive     TRIMETH/SULFA <=20 SENSITIVE Sensitive     AMPICILLIN/SULBACTAM 4 SENSITIVE Sensitive     PIP/TAZO <=4 SENSITIVE Sensitive      Extended ESBL NEGATIVE Sensitive     * RARE KLEBSIELLA PNEUMONIAE         Radiology Studies: Dg Chest 2 View  Result Date: 02/18/2018 CLINICAL DATA:  Follow-up pneumonia. Gastric cancer. Altered mental status. Hypertension and tachycardia. EXAM: CHEST  2 VIEW COMPARISON:  02/17/2018 FINDINGS: Left Port-A-Cath which terminates at the high SVC. Midline trachea. Normal heart size. Atherosclerosis in the transverse aorta. Small bilateral pleural effusions, larger on the left. Interstitial and airspace disease is lower lobe predominant and greater on the left. IMPRESSION: Slightly improved aeration, at least partially felt to be due to decreased pulmonary edema. Concurrent infection, especially within the left lower lobe, cannot be excluded. Small bilateral pleural effusions. Aortic Atherosclerosis (ICD10-I70.0). Electronically Signed   By: Abigail Miyamoto M.D.   On: 02/18/2018 11:22        Scheduled Meds: . aspirin EC  81 mg Oral Daily  . atorvastatin  40 mg Oral q1800  . Chlorhexidine Gluconate Cloth  6 each Topical Q0600  . Chlorhexidine Gluconate Cloth  6 each Topical Daily  . clopidogrel  75 mg Oral Daily  . enoxaparin (LOVENOX) injection  40 mg Subcutaneous Q24H  . feeding supplement (ENSURE ENLIVE)  237 mL Oral TID BM  . lisinopril  2.5 mg Oral Daily  . metoprolol tartrate  37.5 mg Oral BID  . Zinc Oxide   Topical QID   Continuous Infusions: . sodium chloride 250 mL (02/18/18 0000)  . cefTRIAXone (ROCEPHIN)  IV Stopped (02/19/18 1444)  . feeding supplement (OSMOLITE 1.5 CAL) 1,000 mL (02/19/18 1025)  . norepinephrine (LEVOPHED) Adult infusion Stopped (02/15/18 1230)     LOS: 5 days    Time spent: 25 mins.More than 50% of that time was spent in counseling and/or coordination of care.      Marene Lenz, MD [facility] Pager [phone]  If 7PM-7AM, please contact night-coverage [email or web address] Password [facility] 02/19/2018, 3:54 PM

## 2018-02-19 NOTE — Progress Notes (Signed)
    CC:  Leaking J tube  Subjective: Replacement J tube seems to be working well, almost no drainage on dressing.  He has zinc oxide over red area, but already seems a little less tender. Tube feedings restarted in the G tube.  Objective: Vital signs in last 24 hours: Temp:  [97.6 F (36.4 C)-98.4 F (36.9 C)] 98.1 F (36.7 C) (02/20 0341) Pulse Rate:  [73-88] 74 (02/20 0341) Resp:  [12-19] 16 (02/20 0341) BP: (104-122)/(65-76) 104/65 (02/20 0341) SpO2:  [96 %-98 %] 97 % (02/20 0341) Weight:  [53 kg (116 lb 13.5 oz)] 53 kg (116 lb 13.5 oz) (02/20 0341) Last BM Date: 02/18/18 1405 total 395 G tube Urine 2225 Stool x 2 Afebrile, VSS Labs OK    Intake/Output from previous day: 02/19 0701 - 02/20 0700 In: 1405.2 [P.O.:210; I.V.:450; NG/GT:395.2; IV Piggyback:350] Out: 2225 [Urine:2225] Intake/Output this shift: No intake/output data recorded.  General appearance: alert, cooperative and no distress Skin: Jtube site looks good, replacement tube working nicely.   Lab Results:  Recent Labs    02/18/18 0320 02/19/18 0315  WBC 9.2 6.7  HGB 9.7* 9.9*  HCT 28.9* 29.4*  PLT 227 242    BMET Recent Labs    02/18/18 0320 02/19/18 0315  NA 139 137  K 3.4* 3.5  CL 107 105  CO2 22 25  GLUCOSE 113* 144*  BUN 34* 30*  CREATININE 0.93 1.00  CALCIUM 7.8* 7.9*   PT/INR No results for input(s): LABPROT, INR in the last 72 hours.  Recent Labs  Lab 02/12/18 1101 02/14/18 1712 02/18/18 0320 02/19/18 0315  AST 24 44* 40 44*  ALT 26 31 25  32  ALKPHOS 118 94 55 58  BILITOT 0.4 0.6 1.0 0.8  PROT 6.8 6.8 5.4* 5.5*  ALBUMIN 2.7* 3.0* 2.2* 2.2*     Lipase     Component Value Date/Time   LIPASE 26 11/09/2017 0931     Medications: . aspirin EC  81 mg Oral Daily  . atorvastatin  40 mg Oral q1800  . Chlorhexidine Gluconate Cloth  6 each Topical Q0600  . Chlorhexidine Gluconate Cloth  6 each Topical Daily  . clopidogrel  75 mg Oral Daily  . enoxaparin (LOVENOX)  injection  40 mg Subcutaneous Q24H  . feeding supplement (ENSURE ENLIVE)  237 mL Oral TID BM  . lisinopril  2.5 mg Oral Daily  . metoprolol tartrate  37.5 mg Oral BID  . Zinc Oxide   Topical QID   . sodium chloride 250 mL (02/18/18 0000)  . feeding supplement (OSMOLITE 1.5 CAL) 1,000 mL (02/19/18 0340)  . norepinephrine (LEVOPHED) Adult infusion Stopped (02/15/18 1230)  . piperacillin-tazobactam (ZOSYN)  IV 3.375 g (02/19/18 0559)  . vancomycin Stopped (02/18/18 1825)    Assessment/Plan Leaking J tube site after loss of the J-tube Localized abdominal wall cellulitis around the site of the leaking J tube S/p laparoscopic biopsy, gastrojejunostomy, insertion of G tube and J-Tube, 63/01/60, Dr. Leighton [place] Stage IV gastric adenocarcinoma - on Palliative chemotherapy  Anterior STEMI - Medical management Sepsis with Community acquired bilateral pneumonia  DNR  FEN: tube feedings restarted ID:  Zosyn/Vancomycin day 6 DVT:  Lovenox Foley:  Condom cath Follow up:  As needed Dr. Marcello Pratt  Plan:  We will see again as needed.  I would continue current Rx and we will see again as needed.         LOS: 5 days    Lee Pratt 02/19/2018 [phone]

## 2018-02-20 DIAGNOSIS — K9413 Enterostomy malfunction: Secondary | ICD-10-CM

## 2018-02-20 LAB — GLUCOSE, CAPILLARY
GLUCOSE-CAPILLARY: 126 mg/dL — AB (ref 65–99)
GLUCOSE-CAPILLARY: 132 mg/dL — AB (ref 65–99)
GLUCOSE-CAPILLARY: 145 mg/dL — AB (ref 65–99)
GLUCOSE-CAPILLARY: 157 mg/dL — AB (ref 65–99)
Glucose-Capillary: 151 mg/dL — ABNORMAL HIGH (ref 65–99)
Glucose-Capillary: 149 mg/dL — ABNORMAL HIGH (ref 65–99)

## 2018-02-20 NOTE — Progress Notes (Signed)
Nutrition Follow-up  DOCUMENTATION CODES:   Underweight, Severe malnutrition in context of chronic illness  INTERVENTION:   Osmolite 1.5  @ 60 ml/hr Provides: 2160 kcals, 90 grams protein, 1094 ml free water. Meets 100% of calorie and protein needs.   Will continue Ensure Enlive po as tolerated.   Monitor magnesium, potassium, and phosphorus, MD to replete as needed, as pt is at risk for refeeding syndrome.   NUTRITION DIAGNOSIS:   Severe Malnutrition related to chronic illness, cancer and cancer related treatments as evidenced by severe fat depletion, severe muscle depletion.  Ongoing  GOAL:   Patient will meet greater than or equal to 90% of their needs  Meeting with tube feeding  MONITOR:   PO intake, Supplement acceptance, Weight trends, Labs  REASON FOR ASSESSMENT:   Malnutrition Screening Tool    ASSESSMENT:   Pt with PMH significant for gastric cancer receiving chemotherapy and HTN. Recently seen in the ER for fallen out PEG tube, which was re-inserted. On 2/15 pt was sent [facility] from [facility] for evaluation of AMS, hypotension, and tachycardia. Admitted for anterior MI and bilateral PNA.   Pt has not had anything by mouth since being admitted. Spoke with son who reports pt is likely not eating out of fear of getting nauseous. Offered different food options pt could try, but he refused.  Pt tolerating current tube feeding. Discussed possible tube feeding regimen for home. Pt would like to continue with Osmolite 1.5 at 65 mL an hour for 8 hours +85 mL an hour for 8 hours via PEG tube. This does not meet needs without combined PO intake. Would recommend running Osmolite 1.5 x 24 hours @ 60 ml/hr or continue 16 hours per day with Osmolite 1.5 @ 90 ml/hr. (2160 kcals and 90 g protein)  Pt noted to have increased diarrhea from unknown cause. Will continue to monitor for those symptoms. Weight noted to decrease 9 lb since last RD visit 2/19 (126 lb to 117 lb).    Palliative talked to pt and family. They are looking into possible hospice options.   Medications reviewed and include: IV abx Labs reviewed: Phosphorus 2.4 (L)  Diet Order:  Diet Heart Room service appropriate? Yes; Fluid consistency: Thin  EDUCATION NEEDS:   Education needs have been addressed  Skin:  Skin Assessment: Skin Integrity Issues: Skin Integrity Issues:: Stage II Stage II: sacrum  Last BM:  02/19/18  Height:   Ht Readings from Last 1 Encounters:  02/14/18 5\' 9"  (1.753 m)    Weight:   Wt Readings from Last 1 Encounters:  02/20/18 117 lb 11.6 oz (53.4 kg)    Ideal Body Weight:  72.7 kg  BMI:  Body mass index is 17.39 kg/m.  Estimated Nutritional Needs:   Kcal:  2000-2200 kcal/day  Protein:  75-90 g/day  Fluid:  >2.2 L/day    Mariana Single RD, LDN Clinical Nutrition Pager # - [phone]

## 2018-02-20 NOTE — Care Management Note (Signed)
Case Management Note  Patient Details  Name: Lee Pratt MRN: [id number] Date of Birth: [birth year]  Subjective/Objective:  Acute anterior wall MI                  Action/Plan:Plan to discharge home with [place].    Expected Discharge Date:  (unknown)               Expected Discharge Plan:  [place]  In-House Referral:     Discharge planning Services  CM Consult  Post Acute Care Choice:    Choice offered to:  Adult Children  DME Arranged:    DME Agency:     HH Arranged:  RN, PT, Nurse's Aide [place] Agency:  [facility]  Status of Service:  Completed, signed off  If discussed at [place] of Stay Meetings, dates discussed:    Additional CommentsPurcell Mouton, RN 02/20/2018, 11:44 AM

## 2018-02-20 NOTE — Progress Notes (Signed)
Lee Pratt   DOB:May 03, 1934   DJ#:[id number]   HYI#:[id number]  Oncology f/u   Subjective: Pt has started tube feeds through the G tube, at 83ml/hr, tolerating well. He still has leaking at the J-tube site, where a new J-tube has been placed by surgical team. He denies any pain, has some nausea, feels very week, has not be out of bed. Diarrhea has improved with imodium, rectal tube was removed.    Objective:  Vitals:   02/20/18 1331 02/20/18 2019  BP: 103/60 110/61  Pulse: 67 78  Resp:  16  Temp: 97.7 F (36.5 C) 97.6 F (36.4 C)  SpO2: 97% 96%    Body mass index is 17.39 kg/m.  Intake/Output Summary (Last 24 hours) at 02/20/2018 2120 Last data filed at 02/20/2018 1515 Gross per 24 hour  Intake 1405 ml  Output 100 ml  Net 1305 ml     Sclerae unicteric  Oropharynx clear  No peripheral adenopathy  Lungs clear -- no rales or rhonchi  Heart regular rate and rhythm  Abdomen benign, soft, (+) G-tube and J-tube in place, (+)yellow/greenish secretion at the J-tube site  l   CBG (last 3)  Recent Labs    02/20/18 0750 02/20/18 1331 02/20/18 1707  GLUCAP 157* 145* 149*     Labs:  Lab Results  Component Value Date   WBC 6.7 02/19/2018   HGB 9.9 (L) 02/19/2018   HCT 29.4 (L) 02/19/2018   MCV 92.5 02/19/2018   PLT 242 02/19/2018   NEUTROABS 4.6 02/14/2018    CMP Latest Ref Rng & Units 02/19/2018 02/18/2018 02/17/2018  Glucose 65 - 99 mg/dL 144(H) 113(H) 123(H)  BUN 6 - 20 mg/dL 30(H) 34(H) 35(H)  Creatinine 0.61 - 1.24 mg/dL 1.00 0.93 1.09  Sodium 135 - 145 mmol/L 137 139 140  Potassium 3.5 - 5.1 mmol/L 3.5 3.4(L) 3.6  Chloride 101 - 111 mmol/L 105 107 109  CO2 22 - 32 mmol/L 25 22 24   Calcium 8.9 - 10.3 mg/dL 7.9(L) 7.8(L) 8.1(L)  Total Protein 6.5 - 8.1 g/dL 5.5(L) 5.4(L) -  Total Bilirubin 0.3 - 1.2 mg/dL 0.8 1.0 -  Alkaline Phos 38 - 126 U/L 58 55 -  AST 15 - 41 U/L 44(H) 40 -  ALT 17 - 63 U/L 32 25 -     Urine Studies No results for input(s): UHGB, CRYS in the  last 72 hours.  Invalid input(s): UACOL, UAPR, USPG, UPH, UTP, UGL, UKET, UBIL, UNIT, UROB, [place], UEPI, UWBC, [place] Heck [place], [place]  Basic Metabolic Panel: Recent Labs  Lab 02/15/18 0420 02/16/18 0357 02/17/18 0446 02/18/18 0320 02/19/18 0315  NA 142 139 140 139 137  K 3.6 2.7* 3.6 3.4* 3.5  CL 115* 110 109 107 105  CO2 17* 20* 24 22 25   GLUCOSE 108* 107* 123* 113* 144*  BUN 30* 25* 35* 34* 30*  CREATININE 0.96 1.00 1.09 0.93 1.00  CALCIUM 7.4* 7.7* 8.1* 7.8* 7.9*  MG  --  2.1 2.1 2.0 2.0  PHOS  --  2.5 1.9*  --  2.4*   GFR Estimated Creatinine Clearance: 43 mL/min (by C-G formula based on SCr of 1 mg/dL). Liver Function Tests: Recent Labs  Lab 02/14/18 1712 02/18/18 0320 02/19/18 0315  AST 44* 40 44*  ALT 31 25 32  ALKPHOS 94 55 58  BILITOT 0.6 1.0 0.8  PROT 6.8 5.4* 5.5*  ALBUMIN 3.0* 2.2* 2.2*   No results for input(s): LIPASE, AMYLASE in the last 168 hours.  No results for input(s): AMMONIA in the last 168 hours. Coagulation profile Recent Labs  Lab 02/14/18 1712  INR 1.10    CBC: Recent Labs  Lab 02/14/18 1712 02/15/18 0420 02/16/18 0357 02/17/18 0446 02/18/18 0320 02/19/18 0315  WBC 6.1 7.1 8.1 10.5 9.2 6.7  NEUTROABS 4.6  --   --   --   --   --   HGB 11.5* 10.0* 10.1* 10.0* 9.7* 9.9*  HCT 34.6* 29.1* 29.8* 30.1* 28.9* 29.4*  MCV 92.8 90.1 90.6 91.8 92.0 92.5  PLT 395 260 212 244 227 242   Cardiac Enzymes: Recent Labs  Lab 02/15/18 0032 02/15/18 0352 02/15/18 1125 02/16/18 0357 02/17/18 0446  TROPONINI 12.65* 16.47* 21.08* 14.54* 7.00*   BNP: Invalid input(s): POCBNP CBG: Recent Labs  Lab 02/19/18 2355 02/20/18 0416 02/20/18 0750 02/20/18 1331 02/20/18 1707  GLUCAP 151* 151* 157* 145* 149*   D-Dimer No results for input(s): DDIMER in the last 72 hours. Hgb A1c No results for input(s): HGBA1C in the last 72 hours. Lipid Profile No results for input(s): CHOL, HDL, LDLCALC, TRIG, CHOLHDL, LDLDIRECT in the last 72  hours. Thyroid function studies No results for input(s): TSH, T4TOTAL, T3FREE, THYROIDAB in the last 72 hours.  Invalid input(s): FREET3 Anemia work up No results for input(s): VITAMINB12, FOLATE, FERRITIN, TIBC, IRON, RETICCTPCT in the last 72 hours. Microbiology Recent Results (from the past 240 hour(s))  Aerobic Culture (superficial specimen)     Status: None   Collection Time: 02/14/18  4:15 PM  Result Value Ref Range Status   Specimen Description   Final    WOUND LEFT ABDOMEN Performed at [facility], [address], [place], [place] [address]    Special Requests   Final    NONE Performed at [facility], [place] [address] [place], [place] [address]    Gram Stain   Final    NO WBC SEEN ABUNDANT GRAM NEGATIVE RODS ABUNDANT GRAM POSITIVE RODS FEW GRAM POSITIVE COCCI IN PAIRS Performed at [facility], [place] [address] [place], [place] [address]    Culture   Final    ABUNDANT KLEBSIELLA PNEUMONIAE ABUNDANT SERRATIA MARCESCENS    Report Status 02/17/2018 FINAL  Final   Organism ID, Bacteria KLEBSIELLA PNEUMONIAE  Final   Organism ID, Bacteria SERRATIA MARCESCENS  Final      Susceptibility   Klebsiella pneumoniae - MIC*    AMPICILLIN >=32 RESISTANT Resistant     CEFAZOLIN <=4 SENSITIVE Sensitive     CEFEPIME <=1 SENSITIVE Sensitive     CEFTAZIDIME <=1 SENSITIVE Sensitive     CEFTRIAXONE <=1 SENSITIVE Sensitive     CIPROFLOXACIN <=0.25 SENSITIVE Sensitive     GENTAMICIN <=1 SENSITIVE Sensitive     IMIPENEM <=0.25 SENSITIVE Sensitive     TRIMETH/SULFA <=20 SENSITIVE Sensitive     AMPICILLIN/SULBACTAM 4 SENSITIVE Sensitive     PIP/TAZO <=4 SENSITIVE Sensitive     Extended ESBL NEGATIVE Sensitive     * ABUNDANT KLEBSIELLA PNEUMONIAE   Serratia marcescens - MIC*    CEFAZOLIN >=64 RESISTANT Resistant     CEFEPIME <=1 SENSITIVE Sensitive     CEFTAZIDIME <=1 SENSITIVE Sensitive     CEFTRIAXONE <=1 SENSITIVE Sensitive     CIPROFLOXACIN  <=0.25 SENSITIVE Sensitive     GENTAMICIN <=1 SENSITIVE Sensitive     TRIMETH/SULFA <=20 SENSITIVE Sensitive     * ABUNDANT SERRATIA MARCESCENS  Culture, blood (Routine x 2)     Status: None  Collection Time: 02/14/18  5:12 PM  Result Value Ref Range Status   Specimen Description   Final    BLOOD PORTA CATH Performed at [facility] [address] [place], [place] [address]    Special Requests   Final    BOTTLES DRAWN AEROBIC AND ANAEROBIC Blood Culture adequate volume Performed at [facility] [address] [place], [place] [address]    Culture   Final    NO GROWTH 5 DAYS Performed at [facility], [place] [address] [place], [place] [address]    Report Status 02/19/2018 FINAL  Final  Culture, blood (Routine x 2)     Status: None   Collection Time: 02/14/18  5:12 PM  Result Value Ref Range Status   Specimen Description   Final    BLOOD LEFT WRIST Performed at [facility] [address] [place], [place] [address]    Special Requests   Final    BOTTLES DRAWN AEROBIC AND ANAEROBIC Blood Culture adequate volume Performed at [facility] [address] [place], [place] [address]    Culture   Final    NO GROWTH 5 DAYS Performed at [facility], [place] [address] [place], [place] [address]    Report Status 02/19/2018 FINAL  Final  Urine culture     Status: None   Collection Time: 02/14/18  5:12 PM  Result Value Ref Range Status   Specimen Description   Final    URINE, CATHETERIZED Performed at [facility], [place] [address], [place], [place] [address]    Special Requests   Final    Immunocompromised Performed at [facility], [place] [address], [place], [place] [address]    Culture   Final    NO GROWTH Performed at [facility], [place] [address] [place], [place] [address]    Report Status 02/16/2018 FINAL  Final  C difficile quick scan w PCR  reflex     Status: None   Collection Time: 02/14/18  6:40 PM  Result Value Ref Range Status   C Diff antigen NEGATIVE NEGATIVE Final   C Diff toxin NEGATIVE NEGATIVE Final   C Diff interpretation No C. difficile detected.  Final    Comment: Performed at [facility], [place] [address] [place],  [address]  Gastrointestinal Panel by PCR , Stool     Status: None   Collection Time: 02/14/18  6:40 PM  Result Value Ref Range Status   Campylobacter species NOT DETECTED NOT DETECTED Final   Plesimonas shigelloides NOT DETECTED NOT DETECTED Final   Salmonella species NOT DETECTED NOT DETECTED Final   Yersinia enterocolitica NOT DETECTED NOT DETECTED Final   Vibrio species NOT DETECTED NOT DETECTED Final   Vibrio cholerae NOT DETECTED NOT DETECTED Final   Enteroaggregative E coli (EAEC) NOT DETECTED NOT DETECTED Final   Enteropathogenic E coli (EPEC) NOT DETECTED NOT DETECTED Final   Enterotoxigenic E coli (ETEC) NOT DETECTED NOT DETECTED Final   Shiga like toxin producing E coli (STEC) NOT DETECTED NOT DETECTED Final   Shigella/Enteroinvasive E coli (EIEC) NOT DETECTED NOT DETECTED Final   Cryptosporidium NOT DETECTED NOT DETECTED Final   Cyclospora cayetanensis NOT DETECTED NOT DETECTED Final   Entamoeba histolytica NOT DETECTED NOT DETECTED Final   Giardia lamblia NOT DETECTED NOT DETECTED Final   Adenovirus F40/41 NOT DETECTED NOT DETECTED Final   Astrovirus NOT DETECTED NOT DETECTED Final  Norovirus GI/GII NOT DETECTED NOT DETECTED Final   Rotavirus A NOT DETECTED NOT DETECTED Final   Sapovirus (I, II, IV, and V) NOT DETECTED NOT DETECTED Final    Comment: Performed at [facility], [place]., [place], [place] [address]  Respiratory Panel by PCR     Status: None   Collection Time: 02/15/18  4:20 AM  Result Value Ref Range Status   Adenovirus NOT DETECTED NOT DETECTED Final   Coronavirus 229E NOT DETECTED NOT DETECTED Final   Coronavirus HKU1 NOT  DETECTED NOT DETECTED Final   Coronavirus NL63 NOT DETECTED NOT DETECTED Final   Coronavirus OC43 NOT DETECTED NOT DETECTED Final   Metapneumovirus NOT DETECTED NOT DETECTED Final   Rhinovirus / Enterovirus NOT DETECTED NOT DETECTED Final   Influenza A NOT DETECTED NOT DETECTED Final   Influenza B NOT DETECTED NOT DETECTED Final   Parainfluenza Virus 1 NOT DETECTED NOT DETECTED Final   Parainfluenza Virus 2 NOT DETECTED NOT DETECTED Final   Parainfluenza Virus 3 NOT DETECTED NOT DETECTED Final   Parainfluenza Virus 4 NOT DETECTED NOT DETECTED Final   Respiratory Syncytial Virus NOT DETECTED NOT DETECTED Final   Bordetella pertussis NOT DETECTED NOT DETECTED Final   Chlamydophila pneumoniae NOT DETECTED NOT DETECTED Final   Mycoplasma pneumoniae NOT DETECTED NOT DETECTED Final    Comment: Performed at [facility], [place] [address], [place], [place] [address]  MRSA PCR Screening     Status: None   Collection Time: 02/15/18 12:21 PM  Result Value Ref Range Status   MRSA by PCR NEGATIVE NEGATIVE Final    Comment:        The GeneXpert MRSA Assay (FDA approved for NASAL specimens only), is one component of a comprehensive MRSA colonization surveillance program. It is not intended to diagnose MRSA infection nor to guide or monitor treatment for MRSA infections. Performed at [facility], [place] [address], [place], [place] [address]   Culture, expectorated sputum-assessment     Status: None   Collection Time: 02/15/18 11:47 PM  Result Value Ref Range Status   Specimen Description SPU  Final   Special Requests NONE  Final   Sputum evaluation   Final    THIS SPECIMEN IS ACCEPTABLE FOR SPUTUM CULTURE Performed at [facility], [place] [address], [place], [place] [address]    Report Status 02/16/2018 FINAL  Final  Culture, respiratory (NON-Expectorated)     Status: None   Collection Time: 02/15/18 11:47 PM  Result Value Ref Range Status    Specimen Description   Final    SPU Performed at [facility] [address] [place] [address], [place], [place] [address]    Special Requests   Final    NONE Reflexed from [phone] Performed at [place] [address] [place], [place] [address]    Gram Stain   Final    MODERATE WBC PRESENT, PREDOMINANTLY PMN NO ORGANISMS SEEN Performed at [facility], [place] [address] [place], [place] [address]    Culture RARE KLEBSIELLA PNEUMONIAE  Final   Report Status 02/18/2018 FINAL  Final   Organism ID, Bacteria KLEBSIELLA PNEUMONIAE  Final      Susceptibility   Klebsiella pneumoniae - MIC*    AMPICILLIN RESISTANT Resistant     CEFAZOLIN <=4 SENSITIVE Sensitive     CEFEPIME <=1 SENSITIVE Sensitive     CEFTAZIDIME <=1 SENSITIVE Sensitive     CEFTRIAXONE <=1 SENSITIVE Sensitive  CIPROFLOXACIN <=0.25 SENSITIVE Sensitive     GENTAMICIN <=1 SENSITIVE Sensitive     IMIPENEM <=0.25 SENSITIVE Sensitive     TRIMETH/SULFA <=20 SENSITIVE Sensitive     AMPICILLIN/SULBACTAM 4 SENSITIVE Sensitive     PIP/TAZO <=4 SENSITIVE Sensitive     Extended ESBL NEGATIVE Sensitive     * RARE KLEBSIELLA PNEUMONIAE      Studies:  No results found.  Assessment: 84 y.o.  1. AMI with CHF 2. Sepsis and CAP 3. Wound infection at J-tube site 4. Gastric adenocarcinoma with peritoneal metastasis, on palliative chemo  5. J-tube leaking  6. Deconditioning   Plan:  -I spoke with Dr. Marcello Moores about his J-tube leakage, she recommend putting a stoma bag at the J-tube site. No concerns for tube feeds leaking into peritoneum  -Discussed with pt and his son Will about discharge planning. Plan is to go home with home care vs hospice. Will plan to stay with his parent for now. Since pt has terminal cancer, not a candidate for chemo (at least for the next month), he will be eligible for hospice, which will provide more care at home. If he does recover well from this acute illness, then I will  see him back in 3-4 weeks, and re-discuss cancer treatment options. Pt and his son are interested and will think about it.  -will is concerned about aspiration, I recommend elevated head and chest during tube feeding  -I discussed the options of appetite stimulators with Will, he will think about it  -I spoke with Dr. Hilma Favors today. She will help with hospice arrangement at discharge  -I think he should receive full course of antibiotics for pneumonia  -I will f/u.   [name]  02/20/2018

## 2018-02-20 NOTE — Progress Notes (Signed)
Progress Note  Patient Name: Lee Pratt Date of Encounter: 02/20/2018  Primary Cardiologist: Sherren Mocha, MD   Subjective   No chest pain or dyspnea watching Chinese TV on ipad  Inpatient Medications    Scheduled Meds: . acetaminophen  650 mg Oral TID  . aspirin EC  81 mg Oral Daily  . atorvastatin  40 mg Oral q1800  . Chlorhexidine Gluconate Cloth  6 each Topical Q0600  . Chlorhexidine Gluconate Cloth  6 each Topical Daily  . clopidogrel  75 mg Oral Daily  . enoxaparin (LOVENOX) injection  40 mg Subcutaneous Q24H  . feeding supplement (ENSURE ENLIVE)  237 mL Oral TID BM  . fluconazole  100 mg Oral Daily  . lisinopril  2.5 mg Oral Daily  . metoprolol tartrate  37.5 mg Oral BID  . Zinc Oxide   Topical QID   Continuous Infusions: . sodium chloride 250 mL (02/18/18 0000)  . cefTRIAXone (ROCEPHIN)  IV Stopped (02/19/18 1444)  . feeding supplement (OSMOLITE 1.5 CAL) 1,000 mL (02/19/18 1748)   PRN Meds: sodium chloride, acetaminophen **OR** acetaminophen, HYDROcodone-acetaminophen, lidocaine, ondansetron **OR** ondansetron (ZOFRAN) IV, prochlorperazine, sodium chloride flush, Zinc Oxide, zolpidem   Vital Signs    Vitals:   02/19/18 1635 02/19/18 2014 02/20/18 0423 02/20/18 0700  BP: (!) 98/58 (!) 102/54 (!) 100/57   Pulse:  73 73   Resp:  18 18   Temp:  98.1 F (36.7 C) 98.6 F (37 C)   TempSrc:  Oral Oral   SpO2:  97% 97%   Weight:    117 lb 11.6 oz (53.4 kg)  Height:        Intake/Output Summary (Last 24 hours) at 02/20/2018 0934 Last data filed at 02/20/2018 3474 Gross per 24 hour  Intake 1737.83 ml  Output 400 ml  Net 1337.83 ml   Filed Weights   02/18/18 0556 02/19/18 0341 02/20/18 0700  Weight: 126 lb 5.2 oz (57.3 kg) 116 lb 13.5 oz (53 kg) 117 lb 11.6 oz (53.4 kg)    Telemetry    NSR with ~10 minute episode of SVT with rate to 140s around 6:30pm 02/18/18 - Personally Reviewed  Physical Exam   Affect appropriate Frail thin chinese male HEENT:  normal Neck supple with no adenopathy JVP normal no bruits no thyromegaly Lungs mild exp wheezing and good diaphragmatic motion Heart:  S1/S2 no murmur, no rub, gallop or click PMI enlarged and palpable Left subclavian port a cath  Abdomen: benighn, BS positve, no tenderness, no AAA no bruit.  No HSM or HJR Distal pulses intact with no bruits No edema Neuro non-focal Skin warm and dry No muscular weakness   Labs    Chemistry Recent Labs  Lab 02/14/18 1712  02/17/18 0446 02/18/18 0320 02/19/18 0315  NA 139   < > 140 139 137  K 4.8   < > 3.6 3.4* 3.5  CL 105   < > 109 107 105  CO2 16*   < > 24 22 25   GLUCOSE 117*   < > 123* 113* 144*  BUN 37*   < > 35* 34* 30*  CREATININE 1.04   < > 1.09 0.93 1.00  CALCIUM 8.6*   < > 8.1* 7.8* 7.9*  PROT 6.8  --   --  5.4* 5.5*  ALBUMIN 3.0*  --   --  2.2* 2.2*  AST 44*  --   --  40 44*  ALT 31  --   --  25 32  ALKPHOS 94  --   --  55 58  BILITOT 0.6  --   --  1.0 0.8  GFRNONAA >60   < > >60 >60 >60  GFRAA >60   < > >60 >60 >60  ANIONGAP 18*   < > 7 10 7    < > = values in this interval not displayed.     Hematology Recent Labs  Lab 02/17/18 0446 02/18/18 0320 02/19/18 0315  WBC 10.5 9.2 6.7  RBC 3.28* 3.14* 3.18*  HGB 10.0* 9.7* 9.9*  HCT 30.1* 28.9* 29.4*  MCV 91.8 92.0 92.5  MCH 30.5 30.9 31.1  MCHC 33.2 33.6 33.7  RDW 18.5* 18.3* 17.8*  PLT 244 227 242    Cardiac Enzymes Recent Labs  Lab 02/15/18 0352 02/15/18 1125 02/16/18 0357 02/17/18 0446  TROPONINI 16.47* 21.08* 14.54* 7.00*    Recent Labs  Lab 02/14/18 1729  TROPIPOC 0.04     BNPNo results for input(s): BNP, PROBNP in the last 168 hours.   DDimer No results for input(s): DDIMER in the last 168 hours.   Radiology    Dg Chest 2 View  Result Date: 02/18/2018 CLINICAL DATA:  Follow-up pneumonia. Gastric cancer. Altered mental status. Hypertension and tachycardia. EXAM: CHEST  2 VIEW COMPARISON:  02/17/2018 FINDINGS: Left Port-A-Cath which  terminates at the high SVC. Midline trachea. Normal heart size. Atherosclerosis in the transverse aorta. Small bilateral pleural effusions, larger on the left. Interstitial and airspace disease is lower lobe predominant and greater on the left. IMPRESSION: Slightly improved aeration, at least partially felt to be due to decreased pulmonary edema. Concurrent infection, especially within the left lower lobe, cannot be excluded. Small bilateral pleural effusions. Aortic Atherosclerosis (ICD10-I70.0). Electronically Signed   By: Abigail Miyamoto M.D.   On: 02/18/2018 11:22    Cardiac Studies   Echo 02/15/18: Study Conclusions  - Left ventricle: The cavity size was normal. There was mild   concentric hypertrophy. Systolic function was moderately reduced.   The estimated ejection fraction was in the range of 35% to 40%.   Anterior, anteroseptal, apical and inferoapical severe   hypokinesis to akinesis, suggestive of LAD territory   ischemia/infarct. Doppler parameters are consistent with   pseudonormal left ventricular relaxation (grade 2 diastolic   dysfunction). LV filling pressure is elevated. - Aortic valve: Mildly calcified with mild stenosis. There was mild   regurgitation. Valve area (Vmax): 1.67 cm^2. - Mitral valve: Mildly thickened leaflets . There was mild   regurgitation. - Left atrium: The atrium was normal in size. - Right ventricle: The cavity size was mildly dilated. Mild   hypokinesis. - Tricuspid valve: There was mild regurgitation. - Pulmonary arteries: PA peak pressure: 47 mm Hg (S). - Inferior vena cava: The vessel was dilated. The respirophasic   diameter changes were blunted (< 50%), consistent with elevated   central venous pressure.  Impressions:  - Compared to a prior study in 2012, there has been a significant   reduction in LVEF to 35-40% with severe LAD territory wall motion   abnormality which is new, suggestive of ischemia or infarct -   there is moderate  diastolic dysfunction with elevated LV filling   pressure.  Patient Profile   84 y.o. male  With metastatic gastric cancer , HTN admitted 02/14 with weakness , chills.   Found to have ST elevation anterior MI. Not a cath candidate due to metastatic cancer  Assessment & Plan    1. STEMI: anterior ST elevations now  resolved. Trop peaked at 21.08, downtrended to 7. Not a candidate for cath due to advanced metastatic cancer. Patient is without chest pain complaints.  - Continue medical management with ASA and BBlocker discussed with son reasoning For no intervention He is respiratory Rx and understands   2. SVT: resolved continue beta blocker   3. Ischemic DCM: Echo with EF 35-40% consider PRN lasix at home   4. Gastric Cancer: chemo on hold - Continue care per primary team  ? Going home today with hospice care which would be appropriate   For questions or updates, please contact [place] Please consult [email or web address] for contact info under Cardiology/STEMI.      Signed, Jenkins Rouge, MD  02/20/2018, 9:34 AM   [phone]

## 2018-02-20 NOTE — Plan of Care (Signed)
  Progressing Education: Knowledge of General Education information will improve 02/20/2018 1042 - Progressing by Charvis Lightner, Royetta Crochet, RN Health Behavior/Discharge Planning: Ability to manage health-related needs will improve 02/20/2018 1042 - Progressing by Henrique Parekh, Royetta Crochet, RN Clinical Measurements: Ability to maintain clinical measurements within normal limits will improve 02/20/2018 1042 - Progressing by Domonique Cothran, Royetta Crochet, RN Will remain free from infection 02/20/2018 1042 - Progressing by Akiko Schexnider, Royetta Crochet, RN Diagnostic test results will improve 02/20/2018 1042 - Progressing by Jezabel Lecker, Royetta Crochet, RN Respiratory complications will improve 02/20/2018 1042 - Progressing by Stefannie Defeo, Royetta Crochet, RN Cardiovascular complication will be avoided 02/20/2018 1042 - Progressing by Rafay Dahan, Royetta Crochet, RN Activity: Risk for activity intolerance will decrease 02/20/2018 1042 - Progressing by Jaynell Castagnola, Royetta Crochet, RN Nutrition: Adequate nutrition will be maintained 02/20/2018 1042 - Progressing by Iris Hairston, Royetta Crochet, RN Coping: Level of anxiety will decrease 02/20/2018 1042 - Progressing by Makalah Asberry, Royetta Crochet, RN Elimination: Will not experience complications related to bowel motility 02/20/2018 1042 - Progressing by Ludivina Guymon, Royetta Crochet, RN Will not experience complications related to urinary retention 02/20/2018 1042 - Progressing by Issaiah Seabrooks, Royetta Crochet, RN Pain Managment: General experience of comfort will improve 02/20/2018 1042 - Progressing by Vanna Sailer, Royetta Crochet, RN Safety: Ability to remain free from injury will improve 02/20/2018 1042 - Progressing by Dierks Wach, Royetta Crochet, RN Skin Integrity: Risk for impaired skin integrity will decrease 02/20/2018 1042 - Progressing by Eriverto Byrnes, Royetta Crochet, RN

## 2018-02-20 NOTE — Progress Notes (Addendum)
PROGRESS NOTE    Lee Pratt  WUX:[id number] DOB: 01/02/1934 DOA: 02/14/2018 PCP: [name], MD   Brief Narrative: Lee Pratt y.o.male,of [place] descent, with past medical history significant for gastric cancer on treatment ,hypertension and hyperglycemia who was sent from the cancer center for evaluation of altered mental status, hypotension and tachycardia. His J tube also found to have fallen. Patient also had a G tube which was working.In the emergency room, the patient was noted to have ST elevations and had a temperature of 102. Cardiology was consulted and they advised starting the patient on heparin drip.  His blood pressure continued dropping and he was started on dopamine drip in addition to IV fluids.  Was also suspected to have sepsis with bilateral pneumonia on presentation Patient was admitted to ICU for the management of hypotension and STEMI.  He was transferred back to hospital service on 02/17/18.    Assessment & Plan:   Principal Problem:   Acute anterior wall MI ([place]) Active Problems:   AMI (acute myocardial infarction) ([place])   Acute myocardial infarction ([place])   Pressure injury of skin   Protein-calorie malnutrition, severe   HCAP (healthcare-associated pneumonia)  STEMI: Being treated medically.Not a candidate for cath.  Heparin drip stopped.  Currently on aspirin, beta-blocker and Plavix.  Cardiology was following. Patient denies any chest pain at present.  He will follow-up with cardiology as an outpatient.  Pneumonia: Sputum culture showed Klebsiella pneumonia.  Repeat chest x-ray shows small bilateral pleural effusion, left lower lobe pneumonia.  He was started on vancomycin and zosyn on 02/14/18.  Respiratory status is currently stable.  Antibiotics changed to ceftriaxone on 02/20/18. His antibiotics will be changed to oral,likely omnicef, on discharge to complete the course of 7-10 days. Patient was also evaluated by speech therapy for suspicion of  aspiration pneumonia.  Has been recommended regular solid/thin liquid.  Gastric Adenocarcionma with peritoneal metastasis s/p gastrojejunostomy and PEG placement: s/p cycle 5 of folfox Follows with Dr. Burr Medico. Palliative care is also following.  Palliative care does not think that he is stable enough to be discharged to home with home health   because there is high risk of readmission.  Palliative care will discuss with oncology, Dr. Burr Medico.  Family meeting has been arranged today along with family for possible hospice transition.  J tube Site Wound Infection: Sugery was following now signed off. Was started on broad spectrum abx.  Currently on ceftriaxone . Redness in the J-tube site is already getting better.  Patient is on tube feedings through G-tube.  HFrEF:  EF 35-40%.  On ACE inhibitors, beta-blockers.  Patient will follow up with cardiology after discharge.  SVT: Had an episode of SVT on 02/19/18.  Currently patient is a symptomatic.  Metoprolol increased to 37.5 mg twice daily.  Iron deficiency anemia: Currently H&H is stable  Hypokalemia: Supplemented .  Oral thrush: Started on fluconazole 100 mg daily.  Debility deconditioning :Patient evaluated by physical therapy and recommended home health on discharge.      DVT prophylaxis: Lovenox Code Status: DNR Family Communication: Son present at the bedside Disposition Plan: Hospice at home vs [facility]. Pending discussion by palliative care with family   Consultants: Cardiology, general surgery  Procedures: None  Antimicrobials: Ceftriaxone since 02/20/18 Vancomycin and Zosyn since 02/14/18-02/18/18 Fluconazole since 02/20/18  Subjective: Patient seen and examined the bedside this morning.  Remains comfortable.  No new issues/events.  Objective: Vitals:   02/20/18 0423 02/20/18 0700 02/20/18 0956 02/20/18 1331  BP: Marland Kitchen)  100/57  (!) 113/48 103/60  Pulse: 73  76 67  Resp: 18  16   Temp: 98.6 F (37 C)  98.6 F (37 C) 97.7 F (36.5  C)  TempSrc: Oral  Oral Oral  SpO2: 97%  97% 97%  Weight:  53.4 kg (117 lb 11.6 oz)    Height:        Intake/Output Summary (Last 24 hours) at 02/20/2018 1624 Last data filed at 02/20/2018 1515 Gross per 24 hour  Intake 1687 ml  Output 400 ml  Net 1287 ml   Filed Weights   02/18/18 0556 02/19/18 0341 02/20/18 0700  Weight: 57.3 kg (126 lb 5.2 oz) 53 kg (116 lb 13.5 oz) 53.4 kg (117 lb 11.6 oz)    Examination:  General exam: Appears calm and comfortable ,Not in distress,thin,cacehtic HEENT:PERRL,Oral mucosa moist, Ear/Nose normal on gross exam Respiratory system: Bilateral equal air entry, normal vesicular breath sounds, no wheezes or crackles  Cardiovascular system: S1 & S2 heard, RRR. No JVD, murmurs, rubs, gallops or clicks. Gastrointestinal system: Abdomen is nondistended, soft and nontender. No organomegaly or masses felt. Normal bowel sounds heard. G tube in place. Area of erythema around G-tube site. Central nervous system: Alert and oriented. No focal neurological deficits. Extremities: No edema, no clubbing ,no cyanosis, distal peripheral pulses palpable. Skin: No rashes, lesions or ulcers,no icterus ,no pallor Psychiatry: Judgement and insight appear normal. Mood & affect appropriate.      Data Reviewed: I have personally reviewed following labs and imaging studies  CBC: Recent Labs  Lab 02/14/18 1712 02/15/18 0420 02/16/18 0357 02/17/18 0446 02/18/18 0320 02/19/18 0315  WBC 6.1 7.1 8.1 10.5 9.2 6.7  NEUTROABS 4.6  --   --   --   --   --   HGB 11.5* 10.0* 10.1* 10.0* 9.7* 9.9*  HCT 34.6* 29.1* 29.8* 30.1* 28.9* 29.4*  MCV 92.8 90.1 90.6 91.8 92.0 92.5  PLT 395 260 212 244 227 742   Basic Metabolic Panel: Recent Labs  Lab 02/15/18 0420 02/16/18 0357 02/17/18 0446 02/18/18 0320 02/19/18 0315  NA 142 139 140 139 137  K 3.6 2.7* 3.6 3.4* 3.5  CL 115* 110 109 107 105  CO2 17* 20* 24 22 25   GLUCOSE 108* 107* 123* 113* 144*  BUN 30* 25* 35* 34* 30*    CREATININE 0.96 1.00 1.09 0.93 1.00  CALCIUM 7.4* 7.7* 8.1* 7.8* 7.9*  MG  --  2.1 2.1 2.0 2.0  PHOS  --  2.5 1.9*  --  2.4*   GFR: Estimated Creatinine Clearance: 43 mL/min (by C-G formula based on SCr of 1 mg/dL). Liver Function Tests: Recent Labs  Lab 02/14/18 1712 02/18/18 0320 02/19/18 0315  AST 44* 40 44*  ALT 31 25 32  ALKPHOS 94 55 58  BILITOT 0.6 1.0 0.8  PROT 6.8 5.4* 5.5*  ALBUMIN 3.0* 2.2* 2.2*   No results for input(s): LIPASE, AMYLASE in the last 168 hours. No results for input(s): AMMONIA in the last 168 hours. Coagulation Profile: Recent Labs  Lab 02/14/18 1712  INR 1.10   Cardiac Enzymes: Recent Labs  Lab 02/15/18 0032 02/15/18 0352 02/15/18 1125 02/16/18 0357 02/17/18 0446  TROPONINI 12.65* 16.47* 21.08* 14.54* 7.00*   BNP (last 3 results) No results for input(s): PROBNP in the last 8760 hours. HbA1C: No results for input(s): HGBA1C in the last 72 hours. CBG: Recent Labs  Lab 02/19/18 2010 02/19/18 2355 02/20/18 0416 02/20/18 0750 02/20/18 1331  GLUCAP 138* 151* 151*  157* 145*   Lipid Profile: No results for input(s): CHOL, HDL, LDLCALC, TRIG, CHOLHDL, LDLDIRECT in the last 72 hours. Thyroid Function Tests: No results for input(s): TSH, T4TOTAL, FREET4, T3FREE, THYROIDAB in the last 72 hours. Anemia Panel: No results for input(s): VITAMINB12, FOLATE, FERRITIN, TIBC, IRON, RETICCTPCT in the last 72 hours. Sepsis Labs: Recent Labs  Lab 02/14/18 2030 02/15/18 0032 02/15/18 0420 02/16/18 0357 02/16/18 0914 02/17/18 0446 02/18/18 0320 02/19/18 0315  PROCALCITON  --   --  17.60 11.65  --   --  4.39 2.46  LATICACIDVEN 3.74* 2.8*  --   --  1.1 1.3  --   --     Recent Results (from the past 240 hour(s))  Aerobic Culture (superficial specimen)     Status: None   Collection Time: 02/14/18  4:15 PM  Result Value Ref Range Status   Specimen Description   Final    WOUND LEFT ABDOMEN Performed at [facility], [place] [address], [place], [place] [address]    Special Requests   Final    NONE Performed at [facility], [place] [address], [place], [place] [address]    Gram Stain   Final    NO WBC SEEN ABUNDANT GRAM NEGATIVE RODS ABUNDANT GRAM POSITIVE RODS FEW GRAM POSITIVE COCCI IN PAIRS Performed at [facility], [place] [address], [place], [place] [address]    Culture   Final    ABUNDANT KLEBSIELLA PNEUMONIAE ABUNDANT SERRATIA MARCESCENS    Report Status 02/17/2018 FINAL  Final   Organism ID, Bacteria KLEBSIELLA PNEUMONIAE  Final   Organism ID, Bacteria SERRATIA MARCESCENS  Final      Susceptibility   Klebsiella pneumoniae - MIC*    AMPICILLIN >=32 RESISTANT Resistant     CEFAZOLIN <=4 SENSITIVE Sensitive     CEFEPIME <=1 SENSITIVE Sensitive     CEFTAZIDIME <=1 SENSITIVE Sensitive     CEFTRIAXONE <=1 SENSITIVE Sensitive     CIPROFLOXACIN <=0.25 SENSITIVE Sensitive     GENTAMICIN <=1 SENSITIVE Sensitive     IMIPENEM <=0.25 SENSITIVE Sensitive     TRIMETH/SULFA <=20 SENSITIVE Sensitive     AMPICILLIN/SULBACTAM 4 SENSITIVE Sensitive     PIP/TAZO <=4 SENSITIVE Sensitive     Extended ESBL NEGATIVE Sensitive     * ABUNDANT KLEBSIELLA PNEUMONIAE   Serratia marcescens - MIC*    CEFAZOLIN >=64 RESISTANT Resistant     CEFEPIME <=1 SENSITIVE Sensitive     CEFTAZIDIME <=1 SENSITIVE Sensitive     CEFTRIAXONE <=1 SENSITIVE Sensitive     CIPROFLOXACIN <=0.25 SENSITIVE Sensitive     GENTAMICIN <=1 SENSITIVE Sensitive     TRIMETH/SULFA <=20 SENSITIVE Sensitive     * ABUNDANT SERRATIA MARCESCENS  Culture, blood (Routine x 2)     Status: None   Collection Time: 02/14/18  5:12 PM  Result Value Ref Range Status   Specimen Description   Final    BLOOD PORTA CATH Performed at [place] [address] [place], [place] [address]    Special Requests   Final    BOTTLES DRAWN AEROBIC AND ANAEROBIC Blood Culture adequate volume Performed at [place] [address], [place], [place] [address]    Culture   Final    NO GROWTH 5 DAYS Performed at [facility], [place] [address], [place], [place] [address]    Report Status 02/19/2018 FINAL  Final  Culture, blood (Routine x 2)  Status: None   Collection Time: 02/14/18  5:12 PM  Result Value Ref Range Status   Specimen Description   Final    BLOOD LEFT WRIST Performed at [place] [address] [place], [place] [address]    Special Requests   Final    BOTTLES DRAWN AEROBIC AND ANAEROBIC Blood Culture adequate volume Performed at [place] [address], [place], [place] [address]    Culture   Final    NO GROWTH 5 DAYS Performed at [facility], [place] [address] [place], [place] [address]    Report Status 02/19/2018 FINAL  Final  Urine culture     Status: None   Collection Time: 02/14/18  5:12 PM  Result Value Ref Range Status   Specimen Description   Final    URINE, CATHETERIZED Performed at [facility], [place] [address] [place], [place] [address]    Special Requests   Final    Immunocompromised Performed at [facility], [place] [address] [place], [place] [address]    Culture   Final    NO GROWTH Performed at [facility], [place] [address] [place], [place] [address]    Report Status 02/16/2018 FINAL  Final  C difficile quick scan w PCR reflex     Status: None   Collection Time: 02/14/18  6:40 PM  Result Value Ref Range Status   C Diff antigen NEGATIVE NEGATIVE Final   C Diff toxin NEGATIVE NEGATIVE Final   C Diff interpretation No C. difficile detected.  Final    Comment: Performed at [facility], [place] [address], [place], [place] [address]  Gastrointestinal Panel by PCR , Stool     Status: None   Collection Time: 02/14/18  6:40 PM  Result Value Ref Range Status   Campylobacter species NOT DETECTED NOT DETECTED Final   Plesimonas  shigelloides NOT DETECTED NOT DETECTED Final   Salmonella species NOT DETECTED NOT DETECTED Final   Yersinia enterocolitica NOT DETECTED NOT DETECTED Final   Vibrio species NOT DETECTED NOT DETECTED Final   Vibrio cholerae NOT DETECTED NOT DETECTED Final   Enteroaggregative E coli (EAEC) NOT DETECTED NOT DETECTED Final   Enteropathogenic E coli (EPEC) NOT DETECTED NOT DETECTED Final   Enterotoxigenic E coli (ETEC) NOT DETECTED NOT DETECTED Final   Shiga like toxin producing E coli (STEC) NOT DETECTED NOT DETECTED Final   Shigella/Enteroinvasive E coli (EIEC) NOT DETECTED NOT DETECTED Final   Cryptosporidium NOT DETECTED NOT DETECTED Final   Cyclospora cayetanensis NOT DETECTED NOT DETECTED Final   Entamoeba histolytica NOT DETECTED NOT DETECTED Final   Giardia lamblia NOT DETECTED NOT DETECTED Final   Adenovirus F40/41 NOT DETECTED NOT DETECTED Final   Astrovirus NOT DETECTED NOT DETECTED Final   Norovirus GI/GII NOT DETECTED NOT DETECTED Final   Rotavirus A NOT DETECTED NOT DETECTED Final   Sapovirus (I, II, IV, and V) NOT DETECTED NOT DETECTED Final    Comment: Performed at [facility], [place]., [place], [place] [address]  Respiratory Panel by PCR     Status: None   Collection Time: 02/15/18  4:20 AM  Result Value Ref Range Status   Adenovirus NOT DETECTED NOT DETECTED Final   Coronavirus 229E NOT DETECTED NOT DETECTED Final   Coronavirus HKU1 NOT DETECTED NOT DETECTED Final   Coronavirus NL63 NOT DETECTED NOT DETECTED Final   Coronavirus OC43 NOT DETECTED NOT DETECTED Final  Metapneumovirus NOT DETECTED NOT DETECTED Final   Rhinovirus / Enterovirus NOT DETECTED NOT DETECTED Final   Influenza A NOT DETECTED NOT DETECTED Final   Influenza B NOT DETECTED NOT DETECTED Final   Parainfluenza Virus 1 NOT DETECTED NOT DETECTED Final   Parainfluenza Virus 2 NOT DETECTED NOT DETECTED Final   Parainfluenza Virus 3 NOT DETECTED NOT DETECTED Final   Parainfluenza Virus 4  NOT DETECTED NOT DETECTED Final   Respiratory Syncytial Virus NOT DETECTED NOT DETECTED Final   Bordetella pertussis NOT DETECTED NOT DETECTED Final   Chlamydophila pneumoniae NOT DETECTED NOT DETECTED Final   Mycoplasma pneumoniae NOT DETECTED NOT DETECTED Final    Comment: Performed at [facility], [place] [address], [place], [place] [address]  MRSA PCR Screening     Status: None   Collection Time: 02/15/18 12:21 PM  Result Value Ref Range Status   MRSA by PCR NEGATIVE NEGATIVE Final    Comment:        The GeneXpert MRSA Assay (FDA approved for NASAL specimens only), is one component of a comprehensive MRSA colonization surveillance program. It is not intended to diagnose MRSA infection nor to guide or monitor treatment for MRSA infections. Performed at [facility], [place] [address] [place], [place] [address]   Culture, expectorated sputum-assessment     Status: None   Collection Time: 02/15/18 11:47 PM  Result Value Ref Range Status   Specimen Description SPU  Final   Special Requests NONE  Final   Sputum evaluation   Final    THIS SPECIMEN IS ACCEPTABLE FOR SPUTUM CULTURE Performed at [facility], [place] [address], [place], [place] [address]    Report Status 02/16/2018 FINAL  Final  Culture, respiratory (NON-Expectorated)     Status: None   Collection Time: 02/15/18 11:47 PM  Result Value Ref Range Status   Specimen Description   Final    SPU Performed at [place] [address] [place], [place] [address]    Special Requests   Final    NONE Reflexed from [id number] Performed at [place] [address] [place], [place] [address]    Gram Stain   Final    MODERATE WBC PRESENT, PREDOMINANTLY PMN NO ORGANISMS SEEN Performed at [facility], [place] [address] [place], [place] [address]    Culture RARE KLEBSIELLA PNEUMONIAE  Final   Report Status 02/18/2018 FINAL  Final   Organism  ID, Bacteria KLEBSIELLA PNEUMONIAE  Final      Susceptibility   Klebsiella pneumoniae - MIC*    AMPICILLIN RESISTANT Resistant     CEFAZOLIN <=4 SENSITIVE Sensitive     CEFEPIME <=1 SENSITIVE Sensitive     CEFTAZIDIME <=1 SENSITIVE Sensitive     CEFTRIAXONE <=1 SENSITIVE Sensitive     CIPROFLOXACIN <=0.25 SENSITIVE Sensitive     GENTAMICIN <=1 SENSITIVE Sensitive     IMIPENEM <=0.25 SENSITIVE Sensitive     TRIMETH/SULFA <=20 SENSITIVE Sensitive     AMPICILLIN/SULBACTAM 4 SENSITIVE Sensitive     PIP/TAZO <=4 SENSITIVE Sensitive     Extended ESBL NEGATIVE Sensitive     * RARE KLEBSIELLA PNEUMONIAE         Radiology Studies: No results found.      Scheduled Meds: . acetaminophen  650 mg Oral TID  . aspirin EC  81 mg Oral Daily  . atorvastatin  40 mg Oral q1800  . Chlorhexidine Gluconate Cloth  6 each Topical Q0600  .  Chlorhexidine Gluconate Cloth  6 each Topical Daily  . clopidogrel  75 mg Oral Daily  . enoxaparin (LOVENOX) injection  40 mg Subcutaneous Q24H  . feeding supplement (ENSURE ENLIVE)  237 mL Oral TID BM  . fluconazole  100 mg Oral Daily  . lisinopril  2.5 mg Oral Daily  . metoprolol tartrate  37.5 mg Oral BID  . Zinc Oxide   Topical QID   Continuous Infusions: . sodium chloride 250 mL (02/18/18 0000)  . cefTRIAXone (ROCEPHIN)  IV Stopped (02/20/18 1515)  . feeding supplement (OSMOLITE 1.5 CAL) 1,000 mL (02/20/18 1515)     LOS: 6 days    Time spent: 25 mins.More than 50% of that time was spent in counseling and/or coordination of care.      Marene Lenz, MD [facility] Pager [phone]  If 7PM-7AM, please contact night-coverage [email or web address] Password [facility] 02/20/2018, 4:24 PM

## 2018-02-20 NOTE — Care Management Note (Signed)
Case Management Note  Patient Details  Name: Lee Pratt MRN: [id number] Date of Birth: [birth year]  Subjective/Objective:                    Action/Plan:   Expected Discharge Date:  (unknown)               Expected Discharge Plan:  [place]  In-House Referral:     Discharge planning Services  CM Consult  Post Acute Care Choice:    Choice offered to:  Adult Children  DME Arranged:    DME Agency:     HH Arranged:  RN, PT, Nurse's Aide [place] Agency:  [place]  Status of Service:  Completed, signed off  If discussed at [place] of Stay Meetings, dates discussed:    Additional CommentsPurcell Mouton, RN 02/20/2018, 11:48 AM

## 2018-02-21 DIAGNOSIS — C169 Malignant neoplasm of stomach, unspecified: Secondary | ICD-10-CM

## 2018-02-21 DIAGNOSIS — J9601 Acute respiratory failure with hypoxia: Secondary | ICD-10-CM

## 2018-02-21 DIAGNOSIS — E43 Unspecified severe protein-calorie malnutrition: Secondary | ICD-10-CM

## 2018-02-21 DIAGNOSIS — J189 Pneumonia, unspecified organism: Secondary | ICD-10-CM

## 2018-02-21 LAB — BASIC METABOLIC PANEL
Anion gap: 7 (ref 5–15)
BUN: 27 mg/dL — AB (ref 6–20)
CHLORIDE: 109 mmol/L (ref 101–111)
CO2: 23 mmol/L (ref 22–32)
CREATININE: 0.64 mg/dL (ref 0.61–1.24)
Calcium: 8 mg/dL — ABNORMAL LOW (ref 8.9–10.3)
Glucose, Bld: 136 mg/dL — ABNORMAL HIGH (ref 65–99)
Potassium: 4 mmol/L (ref 3.5–5.1)
SODIUM: 139 mmol/L (ref 135–145)

## 2018-02-21 LAB — GLUCOSE, CAPILLARY
GLUCOSE-CAPILLARY: 146 mg/dL — AB (ref 65–99)
GLUCOSE-CAPILLARY: 129 mg/dL — AB (ref 65–99)
GLUCOSE-CAPILLARY: 127 mg/dL — AB (ref 65–99)
Glucose-Capillary: 133 mg/dL — ABNORMAL HIGH (ref 65–99)

## 2018-02-21 MED ORDER — FUROSEMIDE 20 MG PO TABS: 20.0000 mg | ORAL_TABLET | Freq: Every day | ORAL | Status: DC | PRN

## 2018-02-21 MED ORDER — LISINOPRIL 2.5 MG PO TABS: 2.5000 mg | ORAL_TABLET | Freq: Every day | ORAL | 6 refills | Status: AC

## 2018-02-21 MED ORDER — OSMOLITE 1.5 CAL PO LIQD: 1000.0000 mL | ORAL | 5 refills | Status: AC

## 2018-02-21 MED ORDER — ZINC OXIDE 12.8 % EX OINT: TOPICAL_OINTMENT | Freq: Four times a day (QID) | CUTANEOUS | 0 refills | Status: DC

## 2018-02-21 MED ORDER — METOPROLOL TARTRATE 37.5 MG PO TABS: 37.5000 mg | ORAL_TABLET | Freq: Two times a day (BID) | ORAL | 6 refills | Status: AC

## 2018-02-21 MED ORDER — CLOPIDOGREL BISULFATE 75 MG PO TABS: 75.0000 mg | ORAL_TABLET | Freq: Every day | ORAL | 6 refills | Status: AC

## 2018-02-21 MED ORDER — ENSURE ENLIVE PO LIQD: 237.0000 mL | Freq: Three times a day (TID) | ORAL | 12 refills | Status: AC

## 2018-02-21 MED ORDER — FLUCONAZOLE 100 MG PO TABS: 100.0000 mg | ORAL_TABLET | Freq: Every day | ORAL | 0 refills | Status: DC

## 2018-02-21 MED ORDER — HYDROCODONE-ACETAMINOPHEN 5-325 MG PO TABS: 1.0000 | ORAL_TABLET | Freq: Four times a day (QID) | ORAL | 0 refills | Status: AC | PRN

## 2018-02-21 MED ORDER — HEPARIN SOD (PORK) LOCK FLUSH 100 UNIT/ML IV SOLN
500.0000 [IU] | INTRAVENOUS | Status: AC | PRN
  Administered 2018-02-21: 500 [IU]

## 2018-02-21 MED ORDER — MIRTAZAPINE 7.5 MG PO TABS: 7.5000 mg | ORAL_TABLET | Freq: Every day | ORAL | 0 refills | Status: AC

## 2018-02-21 MED ORDER — ATORVASTATIN CALCIUM 40 MG PO TABS: 40.0000 mg | ORAL_TABLET | Freq: Every day | ORAL | 6 refills | Status: AC

## 2018-02-21 MED ORDER — FUROSEMIDE 20 MG PO TABS: 20.0000 mg | ORAL_TABLET | Freq: Every day | ORAL | 1 refills | Status: DC | PRN

## 2018-02-21 NOTE — Consult Note (Signed)
[facility]:  Met with son, pt and his wife at bedside. Pt son translated the pt's wishes and helped with conversation. They are in agreement with hospice philosphy and pt's over statis the son reports as poor. They have ben approved appropriate by our MD at hospice and when they return home we will follow up and get them enrolled to hospice care. I am ordering a suction machine and nebulizer for pt at home. He has equipment in home of walker and kangaroo pump for feeding with [facility]. He had a hospital bed and has refused it so it was recommended for the son to by a wedge to help pt not be laying flat while receiving feedings.Son is hoping to take pt home by car but RN is going to evaluate this further. The son feels that after seeing hm walk with PT today he could get in the home.  Webb Silversmith RN  [phone]

## 2018-02-21 NOTE — Progress Notes (Signed)
Palliative Care Follow-up  Met with Lee Pratt and his son Lee Pratt today- they feel comfortable with the plan to go home with hospice. His diarrhea has resolved. He continues to be very weak. He has a G-Tube that is for supplemental feeding -previously had a J-tube to divert proximal outlet obstruction-that site was infected and leaking -possible chemical irritation or even fungal infection. He continues to deny any kind of pain but occasionally shows non-verbal signs of pain. Started tx for oral thrush yesterday. He has been able to transfer from bed to chart- PT is evaluating him for safety and needs.  Given his current state he is appropriate for home hospice referral- I have discussed this with Dr. Burr Medico.He should be ready for discharge -plan is to pull J-tube and have WOC see for drg recommendations at discharge.  Lane Hacker, DO Palliative Medicine  I completed son's FMLA paperwork and DNR signed on chart   Time: 35 min Greater than 50%  of this time was spent counseling and coordinating care related to the above assessment and plan.

## 2018-02-21 NOTE — Progress Notes (Signed)
Discharged to home, family at bedside, reviewed ostomy care with patient and son. Acknowledged understanding and demonstrated understanding. Pt stable condition. Pleasant, questions addressed. SRP, RN

## 2018-02-21 NOTE — Consult Note (Signed)
[place] Nurse ostomy consult note Stoma type/location: LLQ jejunostomy tube with leaking.  MD requested I cut suture securing tube to skin and removed tube without incident.  Skin cleansed with NS, patted dry several times as effluent (gastric contents continued to spill out of aperture. During a few moments of no effluent, skin barrier ring and Eakin pouch applied.  Angle is less than ideal (it angles off to the left flank), but the G-tube proximally limits pouch placement. Stomal assessment/size: < 3/4 inch Peristomal assessment: denuded from 4-8 o'clock Treatment options for stomal/peristomal skin: light dusting with pectin (stoma) powder. Output: light brown/yellow gold gastric contents with mucus Ostomy pouching: 1pc.Eakin pouch with skin barrier ring. Attached pouching system to bedside urinary drainage bad due to increased output. Education provided: None Bedside RN Theora Gianotti will instruct patient and family on emptying of bedside urinary drainage bag. Supplies reviewed with Hospice Intake RN and some are sent home (3 Eakin pouches, 5 rings, adapters, tape).  Additionally, supplies for a "Plan B" using a urinary drainage bag with an adapter and skin barrier rings is also sent home with the patient.  [place] nursing team will not follow, but will remain available to this patient, the nursing and medical teams.  Please re-consult if needed. Thanks, Maudie Flakes, MSN, RN, [place], Arther Abbott  Pager# [phone]

## 2018-02-21 NOTE — Progress Notes (Signed)
I agree with the previous RN assessment.

## 2018-02-21 NOTE — Discharge Summary (Addendum)
Physician Discharge Summary  Lee Pratt TMH:[id number] DOB: May 24, 1934 DOA: 02/14/2018  PCP: [name], MD  Admit date: 02/14/2018 Discharge date: 02/21/2018  Admitted From: Home Disposition:  Home   Recommendations for Outpatient Follow-up:  1. Follow up with PCP in 1 week 2. Outpatient follow-up with home hospice at earliest convenience 3. Follow-up with Dr. Feng/oncology if patient recovers from this acute illness 4. Follow-up with cardiology as needed  Home Health: Yes; with home hospice  Equipment/Devices: None Discharge Condition: Poor CODE STATUS: DO NOT RESUSCITATE Diet recommendation: Heart Healthy /tube feeding diet as per dietary recommendations  Brief/Interim Summary: 84 year old male with history of gastric cancer on palliative chemotherapy, hypertension and hyperglycemia who was sent from [facility] for evaluation of altered mental status, hypotension and tachycardia. His J-tube was found to have fallen. In the emergency room, patient was noted to have ST elevations and an elevated temperature of 102. Cardiology was consulted, patient was started on heparin drip. His blood pressure dropped and he required dopamine drip. He was also found to have bilateral pneumonia and started on antibiotics. Patient was admitted to ICU for management of hypotension and STEMI. He was transferred back to hospitalist service on 02/17/2018. Cardiology recommended conservative medical management. Sputum culture grew Klebsiella pneumoniae. Broad-spectrum antibiotics were changed to Rocephin on 02/20/2018. Today will be day 7 of antibiotic treatment. Antibiotics will be discontinued. Oncology and palliative care have been following the patient. Because of overall poor prognosis, patient will be discharged home with probable home hospice today. Patient is getting feedings through G-tube. J-tube site is still very erythematous and has persistent leakage. General surgery was called back to probably remove  the J tube and place an ostomy bag.  Discharge Diagnoses:  Principal Problem:   Acute anterior wall MI ([place]) Active Problems:   AMI (acute myocardial infarction) ([place])   Acute myocardial infarction ([place])   Pressure injury of skin   Protein-calorie malnutrition, severe   HCAP (healthcare-associated pneumonia)  STEMI: Being treated medically. Not a candidate for cath.  Heparin drip stopped.  Continue aspirin, beta-blocker, lisinopril, atorvastatin and Plavix.  Cardiology  following. Patient denies any chest pain at present.  He will follow-up with cardiology as an outpatient if needed.  Pneumonia: Sputum culture showed Klebsiella pneumonia.  Repeat chest x-ray shows small bilateral pleural effusion, left lower lobe pneumonia.  He was started on vancomycin and zosyn on 02/14/18.  Respiratory status is currently stable.  Antibiotics changed to ceftriaxone on 02/20/18. - Today will be day 7 of antibiotics. No need for further antibiotics Patient was also evaluated by speech therapy for suspicion of aspiration pneumonia.  Has been recommended regular solid/thin liquid.  Gastric Adenocarcionma with peritoneal metastasis s/p gastrojejunostomy and PEG placement: s/p cycle 5 of folfox Follows with Dr. Burr Medico. Palliative care is also following.   overall prognosis is very poor. Patient will be discharged home today with home hospice. - G-tube feeding will be continued. Encourage the patient to increase oral intake if possible; diet as per SLP recommendations. - Will also put him on oral mirtazapine to increase appetite.  J tube Site Wound Infection:Sugery was following now signed off. Was started on broad spectrum abx.  Currently on ceftriaxone . Still has significant redness around J-tube site and persistent leakage. General surgery was called back to probably remove the J tube and place an ostomy bag. This will be done prior to discharge today. - Diflucan will be empirically continued for 2 more  weeks as there might be superimposed  fungal infection as well.   HFrEF:EF 35-40%. On ACE inhibitors, beta-blockers.  Patient will follow up with cardiology after discharge if needed.  SVT: Had an episode of SVT on 02/19/18.  Currently patient is a symptomatic.   continue metoprolol.  Iron deficiency anemia: Currently H&H is stable  Hypokalemia: Supplemented .  Oral thrush: Started on fluconazole 100 mg daily. Plan as above   Discharge Instructions  Discharge Instructions    Ambulatory referral to Oncology   Complete by:  As directed    Gastric cancer   Diet - low sodium heart healthy   Complete by:  As directed    Increase activity slowly   Complete by:  As directed      Allergies as of 02/21/2018   No Known Allergies     Medication List    TAKE these medications   aspirin EC 81 MG tablet Take 81 mg by mouth 3 (three) times a week.   atorvastatin 40 MG tablet Commonly known as:  LIPITOR Take 1 tablet (40 mg total) by mouth daily at 6 PM.   clopidogrel 75 MG tablet Commonly known as:  PLAVIX Take 1 tablet (75 mg total) by mouth daily.   docusate sodium 100 MG capsule Commonly known as:  COLACE Take 1 capsule (100 mg total) by mouth 2 (two) times daily as needed for mild constipation.   feeding supplement (ENSURE ENLIVE) Liqd Take 237 mLs by mouth 3 (three) times daily between meals.   feeding supplement (OSMOLITE 1.5 CAL) Liqd Place 1,000 mLs into feeding tube continuous for 10 days.   fluconazole 100 MG tablet Commonly known as:  DIFLUCAN Take 1 tablet (100 mg total) by mouth daily. Start taking on:  02/22/2018   furosemide 20 MG tablet Commonly known as:  LASIX Take 1 tablet (20 mg total) by mouth daily as needed for edema.   HYDROcodone-acetaminophen 5-325 MG tablet Commonly known as:  NORCO/VICODIN Take 1 tablet by mouth every 6 (six) hours as needed for moderate pain.   lidocaine-prilocaine cream Commonly known as:  EMLA Apply to affected  area once   lisinopril 2.5 MG tablet Commonly known as:  PRINIVIL,ZESTRIL Take 1 tablet (2.5 mg total) by mouth daily.   Metoprolol Tartrate 37.5 MG Tabs Take 37.5 mg by mouth 2 (two) times daily.   mirtazapine 7.5 MG tablet Commonly known as:  REMERON Take 1 tablet (7.5 mg total) by mouth at bedtime.   ondansetron 8 MG disintegrating tablet Commonly known as:  ZOFRAN ODT Take 1 tablet (8 mg total) by mouth 2 (two) times daily as needed for nausea or vomiting.   ondansetron 8 MG tablet Commonly known as:  ZOFRAN Take 1 tablet (8 mg total) by mouth 2 (two) times daily as needed for refractory nausea / vomiting. Start on day 3 after chemotherapy.   prochlorperazine 10 MG tablet Commonly known as:  COMPAZINE Take 1 tablet (10 mg total) by mouth every 6 (six) hours as needed (Nausea or vomiting).   Zinc Oxide 12.8 % ointment Commonly known as:  TRIPLE PASTE Apply topically 4 (four) times daily.       Follow-up Information    Sherren Mocha, MD Follow up.   Specialty:  Cardiology Why:  call for appointment if any cardiac issues, fast heart rate or chest pain.   Contact information: [address] [place] [address] [phone]        [name], MD. Schedule an appointment as soon as possible for a  visit in 1 week(s).   Specialty:  Internal Medicine Contact information: [address] [place] [address] [phone]        [place], [facility] Follow up.   Why:  at earliest convenience Contact information: [address] [place] [address] [phone]          No Known Allergies  Consultations:  Oncology/palliative care/cardiology/general surgery   Procedures/Studies: Dg Chest 2 View  Result Date: 02/18/2018 CLINICAL DATA:  Follow-up pneumonia. Gastric cancer. Altered mental status. Hypertension and tachycardia. EXAM: CHEST  2 VIEW COMPARISON:  02/17/2018 FINDINGS: Left Port-A-Cath which terminates  at the high SVC. Midline trachea. Normal heart size. Atherosclerosis in the transverse aorta. Small bilateral pleural effusions, larger on the left. Interstitial and airspace disease is lower lobe predominant and greater on the left. IMPRESSION: Slightly improved aeration, at least partially felt to be due to decreased pulmonary edema. Concurrent infection, especially within the left lower lobe, cannot be excluded. Small bilateral pleural effusions. Aortic Atherosclerosis (ICD10-I70.0). Electronically Signed   By: Abigail Miyamoto M.D.   On: 02/18/2018 11:22   Ct Chest W Contrast  Result Date: 02/10/2018 CLINICAL DATA:  Gastric cancer diagnosed 3 months ago. Post primary chemotherapy and radiation therapy. EXAM: CT CHEST, ABDOMEN, AND PELVIS WITH CONTRAST TECHNIQUE: Multidetector CT imaging of the chest, abdomen and pelvis was performed following the standard protocol during bolus administration of intravenous contrast. CONTRAST:  183mL ISOVUE-300 IOPAMIDOL (ISOVUE-300) INJECTION 61% COMPARISON:  Chest CT 11/19/2017.  Abdominopelvic CT 11/09/2017. FINDINGS: CT CHEST FINDINGS Cardiovascular: Diffuse atherosclerosis of the aorta, great vessels and coronary arteries. No acute vascular findings are demonstrated. There are aortic valvular calcifications. The heart size is normal. There is no pericardial effusion. Mediastinum/Nodes: There are no enlarged mediastinal, hilar or axillary lymph nodes. Scattered small mediastinal lymph nodes are stable. The thyroid gland, esophagus and trachea demonstrate no significant findings. Probable dependent secretions in the distal trachea. Lungs/Pleura: There is no significant pleural effusion. Pleural calcifications the right hemithorax are again noted. There is stable asymmetric right apical pleuroparenchymal scarring with calcifications. New multifocal nodular airspace opacities are present within the right lower lobe, likely inflammatory. These are most conspicuous on axial images  71, 91 and 122. Scattered small pulmonary nodules bilaterally are stable. Musculoskeletal/Chest wall: No chest wall mass or suspicious osseous findings. CT ABDOMEN AND PELVIS FINDINGS Hepatobiliary: The liver is normal in density without focal abnormality. No evidence of gallstones, gallbladder wall thickening or biliary dilatation. Pancreas: Unremarkable. No pancreatic ductal dilatation or surrounding inflammatory changes. Spleen: Normal in size without focal abnormality. Adrenals/Urinary Tract: The adrenal glands appear stable. Both kidneys appear stable with tiny low-density lesions bilaterally, likely cysts. No evidence of urinary tract calculus or hydronephrosis. The bladder appears normal. Stomach/Bowel: Interval gastrojejunostomy. The circumferential wall thickening in the distal stomach has mildly improved. The gastrojejunostomy is patent. There is a percutaneous G-tube which appears well positioned. In addition, there is a percutaneous jejunal feeding tube, the retention balloon for which is located in the left anterior abdominal wall subcutaneous fat. The tip of this tube is intraluminal. No evidence of bowel obstruction or extravasation of contrast material. The appendix appears normal. Vascular/Lymphatic: There are no enlarged abdominal or pelvic lymph nodes. Extensive aortic and branch vessel atherosclerosis again noted without evidence of acute vascular occlusion. Stable mild dilatation of the mid abdominal aorta with a maximal AP diameter of 2.8 cm. No venous abnormalities are seen. Reproductive: Stable appearance of the prostate gland and seminal vesicles. Other:  Postsurgical changes in the anterior abdominal wall. No ascites or peritoneal nodularity. Musculoskeletal: No acute or significant osseous findings. The sacroiliac joints are ankylosed. IMPRESSION: 1. New patchy pulmonary airspace opacities within the right lower lobe, likely infectious/inflammatory. 2. Interval gastrojejunostomy with  resolution of gastric distention. The gastrojejunostomy is patent. Percutaneous jejunal feeding tube retention balloon is displaced peripherally into the anterior abdominal wall and is at risk for becoming dislodged. 3. No evidence of metastatic gastric cancer. Distal gastric wall thickening noted on the previous studies has improved. 4. Extensive atherosclerosis, including Aortic Atherosclerosis (ICD10-I70.0). 5. Underlying chronic lung disease with pleuroparenchymal scarring asymmetric to the right. 6. These results will be called to the ordering clinician or representative by the Radiologist Assistant, and communication documented in the PACS or zVision Dashboard. Electronically Signed   By: Richardean Sale M.D.   On: 02/10/2018 15:14   Ct Abdomen Pelvis W Contrast  Result Date: 02/10/2018 CLINICAL DATA:  Gastric cancer diagnosed 3 months ago. Post primary chemotherapy and radiation therapy. EXAM: CT CHEST, ABDOMEN, AND PELVIS WITH CONTRAST TECHNIQUE: Multidetector CT imaging of the chest, abdomen and pelvis was performed following the standard protocol during bolus administration of intravenous contrast. CONTRAST:  161mL ISOVUE-300 IOPAMIDOL (ISOVUE-300) INJECTION 61% COMPARISON:  Chest CT 11/19/2017.  Abdominopelvic CT 11/09/2017. FINDINGS: CT CHEST FINDINGS Cardiovascular: Diffuse atherosclerosis of the aorta, great vessels and coronary arteries. No acute vascular findings are demonstrated. There are aortic valvular calcifications. The heart size is normal. There is no pericardial effusion. Mediastinum/Nodes: There are no enlarged mediastinal, hilar or axillary lymph nodes. Scattered small mediastinal lymph nodes are stable. The thyroid gland, esophagus and trachea demonstrate no significant findings. Probable dependent secretions in the distal trachea. Lungs/Pleura: There is no significant pleural effusion. Pleural calcifications the right hemithorax are again noted. There is stable asymmetric right  apical pleuroparenchymal scarring with calcifications. New multifocal nodular airspace opacities are present within the right lower lobe, likely inflammatory. These are most conspicuous on axial images 71, 91 and 122. Scattered small pulmonary nodules bilaterally are stable. Musculoskeletal/Chest wall: No chest wall mass or suspicious osseous findings. CT ABDOMEN AND PELVIS FINDINGS Hepatobiliary: The liver is normal in density without focal abnormality. No evidence of gallstones, gallbladder wall thickening or biliary dilatation. Pancreas: Unremarkable. No pancreatic ductal dilatation or surrounding inflammatory changes. Spleen: Normal in size without focal abnormality. Adrenals/Urinary Tract: The adrenal glands appear stable. Both kidneys appear stable with tiny low-density lesions bilaterally, likely cysts. No evidence of urinary tract calculus or hydronephrosis. The bladder appears normal. Stomach/Bowel: Interval gastrojejunostomy. The circumferential wall thickening in the distal stomach has mildly improved. The gastrojejunostomy is patent. There is a percutaneous G-tube which appears well positioned. In addition, there is a percutaneous jejunal feeding tube, the retention balloon for which is located in the left anterior abdominal wall subcutaneous fat. The tip of this tube is intraluminal. No evidence of bowel obstruction or extravasation of contrast material. The appendix appears normal. Vascular/Lymphatic: There are no enlarged abdominal or pelvic lymph nodes. Extensive aortic and branch vessel atherosclerosis again noted without evidence of acute vascular occlusion. Stable mild dilatation of the mid abdominal aorta with a maximal AP diameter of 2.8 cm. No venous abnormalities are seen. Reproductive: Stable appearance of the prostate gland and seminal vesicles. Other: Postsurgical changes in the anterior abdominal wall. No ascites or peritoneal nodularity. Musculoskeletal: No acute or significant osseous  findings. The sacroiliac joints are ankylosed. IMPRESSION: 1. New patchy pulmonary airspace opacities within the right lower lobe, likely infectious/inflammatory. 2.  Interval gastrojejunostomy with resolution of gastric distention. The gastrojejunostomy is patent. Percutaneous jejunal feeding tube retention balloon is displaced peripherally into the anterior abdominal wall and is at risk for becoming dislodged. 3. No evidence of metastatic gastric cancer. Distal gastric wall thickening noted on the previous studies has improved. 4. Extensive atherosclerosis, including Aortic Atherosclerosis (ICD10-I70.0). 5. Underlying chronic lung disease with pleuroparenchymal scarring asymmetric to the right. 6. These results will be called to the ordering clinician or representative by the Radiologist Assistant, and communication documented in the PACS or zVision Dashboard. Electronically Signed   By: Richardean Sale M.D.   On: 02/10/2018 15:14   Dg Chest Port 1 View  Result Date: 02/17/2018 CLINICAL DATA:  Respiratory failure EXAM: PORTABLE CHEST 1 VIEW COMPARISON:  February 16, 2018 chest radiograph and chest CT February 10, 2018 FINDINGS: Port-A-Cath tip is in the superior vena cava. No pneumothorax. Ovoid opacity along the right minor fissure is stable and felt to represent loculated fluid. Elsewhere there is interstitial edema with pleural effusions bilaterally, left larger than right, stable. There is bibasilar atelectasis. There are scattered calcified granulomas on the right. There is a skin fold on the right. No pneumothorax appreciable. Heart size is upper normal with pulmonary vascularity showing evidence of pulmonary venous hypertension. There is aortic atherosclerosis. No adenopathy. No evident bone lesions. IMPRESSION: Pulmonary vascular congestion with pulmonary edema, felt to represent a degree of congestive heart failure. Probable loculated effusion along the right minor fissure. Pleural effusions  bilaterally, left larger than right, stable. No change in Port-A-Cath position. No pneumothorax. There is aortic atherosclerosis. Aortic Atherosclerosis (ICD10-I70.0). Electronically Signed   By: Lowella Grip III M.D.   On: 02/17/2018 07:05   Dg Chest Port 1 View  Result Date: 02/16/2018 CLINICAL DATA:  Acute respiratory failure. EXAM: PORTABLE CHEST 1 VIEW COMPARISON:  Radiographs 02/14/2018.  CT 02/10/2018 FINDINGS: Left chest port remains in place. Development of diffuse perihilar opacities and peribronchial thickening suspicious for pulmonary edema. Rounded density in the right mid lung corresponds to is likely fluid in the right minor fissure. Small bilateral pleural effusions are new. Heart size normal. Aortic atherosclerosis. Calcified densities in the right hemithorax correspond to pleural calcifications on CT. IMPRESSION: Development of CHF with moderate pulmonary edema and small pleural effusions. Electronically Signed   By: Jeb Levering M.D.   On: 02/16/2018 05:24   Dg Chest Portable 1 View  Result Date: 02/14/2018 CLINICAL DATA:  Tachypnea. Weakness. Possible sepsis. Gastric cancer. EXAM: PORTABLE CHEST 1 VIEW COMPARISON:  Chest CT 02/10/2018 and radiograph 11/20/2017 FINDINGS: A left subclavian Port-A-Cath terminates over the upper to mid SVC. The cardiac silhouette is normal in size. Aortic atherosclerosis is noted. Pleural calcifications are again seen in the right hemithorax. Patchy airspace and fine interstitial opacities are present in both lung bases. No sizable pleural effusion or pneumothorax is identified. No acute osseous abnormality is seen. IMPRESSION: Mild bibasilar opacity suspicious for pneumonia. Electronically Signed   By: Logan Bores M.D.   On: 02/14/2018 18:53     Subjective: Patient seen and examined at bedside. He looks comfortable. No overnight fever, nausea or vomiting.  Discharge Exam: Vitals:   02/20/18 2019 02/21/18 0418  BP: 110/61 (!) 105/59   Pulse: 78 70  Resp: 16 18  Temp: 97.6 F (36.4 C) 98.4 F (36.9 C)  SpO2: 96% 94%   Vitals:   02/20/18 1331 02/20/18 2019 02/21/18 0418 02/21/18 0843  BP: 103/60 110/61 (!) 105/59   Pulse: 67 78 70  Resp:  16 18   Temp: 97.7 F (36.5 C) 97.6 F (36.4 C) 98.4 F (36.9 C)   TempSrc: Oral Oral Oral   SpO2: 97% 96% 94%   Weight:    57.5 kg (126 lb 12.2 oz)  Height:        General: No acute distress, very thinly built elderly male. Cardiovascular: Rate controlled, S1/S2 + Respiratory: Bilateral decreased breath sounds at bases  Abdominal: Soft, NT, ND, bowel sounds +. G-tube in place. Area of erythema around J-tube site Extremities: no edema, no cyanosis    The results of significant diagnostics from this hospitalization (including imaging, microbiology, ancillary and laboratory) are listed below for reference.     Microbiology: Recent Results (from the past 240 hour(s))  Aerobic Culture (superficial specimen)     Status: None   Collection Time: 02/14/18  4:15 PM  Result Value Ref Range Status   Specimen Description   Final    WOUND LEFT ABDOMEN Performed at [facility], [address], [place], [place] [address]    Special Requests   Final    NONE Performed at [facility], [place] [address], [place], [place] [address]    Gram Stain   Final    NO WBC SEEN ABUNDANT GRAM NEGATIVE RODS ABUNDANT GRAM POSITIVE RODS FEW GRAM POSITIVE COCCI IN PAIRS Performed at [facility], [place] [address] [place], [place] [address]    Culture   Final    ABUNDANT KLEBSIELLA PNEUMONIAE ABUNDANT SERRATIA MARCESCENS    Report Status 02/17/2018 FINAL  Final   Organism ID, Bacteria KLEBSIELLA PNEUMONIAE  Final   Organism ID, Bacteria SERRATIA MARCESCENS  Final      Susceptibility   Klebsiella pneumoniae - MIC*    AMPICILLIN >=32 RESISTANT Resistant     CEFAZOLIN <=4 SENSITIVE Sensitive     CEFEPIME <=1 SENSITIVE Sensitive     CEFTAZIDIME <=1  SENSITIVE Sensitive     CEFTRIAXONE <=1 SENSITIVE Sensitive     CIPROFLOXACIN <=0.25 SENSITIVE Sensitive     GENTAMICIN <=1 SENSITIVE Sensitive     IMIPENEM <=0.25 SENSITIVE Sensitive     TRIMETH/SULFA <=20 SENSITIVE Sensitive     AMPICILLIN/SULBACTAM 4 SENSITIVE Sensitive     PIP/TAZO <=4 SENSITIVE Sensitive     Extended ESBL NEGATIVE Sensitive     * ABUNDANT KLEBSIELLA PNEUMONIAE   Serratia marcescens - MIC*    CEFAZOLIN >=64 RESISTANT Resistant     CEFEPIME <=1 SENSITIVE Sensitive     CEFTAZIDIME <=1 SENSITIVE Sensitive     CEFTRIAXONE <=1 SENSITIVE Sensitive     CIPROFLOXACIN <=0.25 SENSITIVE Sensitive     GENTAMICIN <=1 SENSITIVE Sensitive     TRIMETH/SULFA <=20 SENSITIVE Sensitive     * ABUNDANT SERRATIA MARCESCENS  Culture, blood (Routine x 2)     Status: None   Collection Time: 02/14/18  5:12 PM  Result Value Ref Range Status   Specimen Description   Final    BLOOD PORTA CATH Performed at  [address], [place], [place] [address]    Special Requests   Final    BOTTLES DRAWN AEROBIC AND ANAEROBIC Blood Culture adequate volume Performed at [facility] [address] [place], [place] [address]    Culture   Final    NO GROWTH 5 DAYS Performed at [facility], [place] [address], [place], [place] [address]    Report Status 02/19/2018 FINAL  Final  Culture, blood (Routine  x 2)     Status: None   Collection Time: 02/14/18  5:12 PM  Result Value Ref Range Status   Specimen Description   Final    BLOOD LEFT WRIST Performed at [facility] [address], [place], [place] [address]    Special Requests   Final    BOTTLES DRAWN AEROBIC AND ANAEROBIC Blood Culture adequate volume Performed at [facility] [address] [place], [place] [address]    Culture   Final    NO GROWTH 5 DAYS Performed at [facility], [place] [address], [place], [place] [address]    Report  Status 02/19/2018 FINAL  Final  Urine culture     Status: None   Collection Time: 02/14/18  5:12 PM  Result Value Ref Range Status   Specimen Description   Final    URINE, CATHETERIZED Performed at [facility], [place] [address], [place], [place] [address]    Special Requests   Final    Immunocompromised Performed at [facility], [place] [address], [place], [place] [address]    Culture   Final    NO GROWTH Performed at [facility], [place] [address] [place], [place] [address]    Report Status 02/16/2018 FINAL  Final  C difficile quick scan w PCR reflex     Status: None   Collection Time: 02/14/18  6:40 PM  Result Value Ref Range Status   C Diff antigen NEGATIVE NEGATIVE Final   C Diff toxin NEGATIVE NEGATIVE Final   C Diff interpretation No C. difficile detected.  Final    Comment: Performed at [facility], [place] [address] [address], [place], [place] [address]  Gastrointestinal Panel by PCR , Stool     Status: None   Collection Time: 02/14/18  6:40 PM  Result Value Ref Range Status   Campylobacter species NOT DETECTED NOT DETECTED Final   Plesimonas shigelloides NOT DETECTED NOT DETECTED Final   Salmonella species NOT DETECTED NOT DETECTED Final   Yersinia enterocolitica NOT DETECTED NOT DETECTED Final   Vibrio species NOT DETECTED NOT DETECTED Final   Vibrio cholerae NOT DETECTED NOT DETECTED Final   Enteroaggregative E coli (EAEC) NOT DETECTED NOT DETECTED Final   Enteropathogenic E coli (EPEC) NOT DETECTED NOT DETECTED Final   Enterotoxigenic E coli (ETEC) NOT DETECTED NOT DETECTED Final   Shiga like toxin producing E coli (STEC) NOT DETECTED NOT DETECTED Final   Shigella/Enteroinvasive E coli (EIEC) NOT DETECTED NOT DETECTED Final   Cryptosporidium NOT DETECTED NOT DETECTED Final   Cyclospora cayetanensis NOT DETECTED NOT DETECTED Final   Entamoeba histolytica NOT DETECTED NOT DETECTED Final   Giardia lamblia NOT DETECTED  NOT DETECTED Final   Adenovirus F40/41 NOT DETECTED NOT DETECTED Final   Astrovirus NOT DETECTED NOT DETECTED Final   Norovirus GI/GII NOT DETECTED NOT DETECTED Final   Rotavirus A NOT DETECTED NOT DETECTED Final   Sapovirus (I, II, IV, and V) NOT DETECTED NOT DETECTED Final    Comment: Performed at [facility], [place]., [place], [place] [address]  Respiratory Panel by PCR     Status: None   Collection Time: 02/15/18  4:20 AM  Result Value Ref Range Status   Adenovirus NOT DETECTED NOT DETECTED Final   Coronavirus 229E NOT DETECTED NOT DETECTED Final   Coronavirus HKU1 NOT DETECTED NOT DETECTED Final   Coronavirus NL63 NOT DETECTED NOT DETECTED Final   Coronavirus OC43  NOT DETECTED NOT DETECTED Final   Metapneumovirus NOT DETECTED NOT DETECTED Final   Rhinovirus / Enterovirus NOT DETECTED NOT DETECTED Final   Influenza A NOT DETECTED NOT DETECTED Final   Influenza B NOT DETECTED NOT DETECTED Final   Parainfluenza Virus 1 NOT DETECTED NOT DETECTED Final   Parainfluenza Virus 2 NOT DETECTED NOT DETECTED Final   Parainfluenza Virus 3 NOT DETECTED NOT DETECTED Final   Parainfluenza Virus 4 NOT DETECTED NOT DETECTED Final   Respiratory Syncytial Virus NOT DETECTED NOT DETECTED Final   Bordetella pertussis NOT DETECTED NOT DETECTED Final   Chlamydophila pneumoniae NOT DETECTED NOT DETECTED Final   Mycoplasma pneumoniae NOT DETECTED NOT DETECTED Final    Comment: Performed at [facility], [place] [address] [place], [place] [address]  MRSA PCR Screening     Status: None   Collection Time: 02/15/18 12:21 PM  Result Value Ref Range Status   MRSA by PCR NEGATIVE NEGATIVE Final    Comment:        The GeneXpert MRSA Assay (FDA approved for NASAL specimens only), is one component of a comprehensive MRSA colonization surveillance program. It is not intended to diagnose MRSA infection nor to guide or monitor treatment for MRSA infections. Performed at [facility], [place] [address] [place], [place] [address]   Culture, expectorated sputum-assessment     Status: None   Collection Time: 02/15/18 11:47 PM  Result Value Ref Range Status   Specimen Description SPU  Final   Special Requests NONE  Final   Sputum evaluation   Final    THIS SPECIMEN IS ACCEPTABLE FOR SPUTUM CULTURE Performed at [facility], [place] [address], [place], [place] [address]    Report Status 02/16/2018 FINAL  Final  Culture, respiratory (NON-Expectorated)     Status: None   Collection Time: 02/15/18 11:47 PM  Result Value Ref Range Status   Specimen Description   Final    SPU Performed at [place] [address], [place], [place] [address]    Special Requests   Final    NONE Reflexed from [id number] Performed at [place] [address], [place], [place] [address]    Gram Stain   Final    MODERATE WBC PRESENT, PREDOMINANTLY PMN NO ORGANISMS SEEN Performed at [facility], [place] [address] [place], [place] [address]    Culture RARE KLEBSIELLA PNEUMONIAE  Final   Report Status 02/18/2018 FINAL  Final   Organism ID, Bacteria KLEBSIELLA PNEUMONIAE  Final      Susceptibility   Klebsiella pneumoniae - MIC*    AMPICILLIN RESISTANT Resistant     CEFAZOLIN <=4 SENSITIVE Sensitive     CEFEPIME <=1 SENSITIVE Sensitive     CEFTAZIDIME <=1 SENSITIVE Sensitive     CEFTRIAXONE <=1 SENSITIVE Sensitive     CIPROFLOXACIN <=0.25 SENSITIVE Sensitive     GENTAMICIN <=1 SENSITIVE Sensitive     IMIPENEM <=0.25 SENSITIVE Sensitive     TRIMETH/SULFA <=20 SENSITIVE Sensitive     AMPICILLIN/SULBACTAM 4 SENSITIVE Sensitive     PIP/TAZO <=4 SENSITIVE Sensitive     Extended ESBL NEGATIVE Sensitive     * RARE KLEBSIELLA PNEUMONIAE     Labs: BNP (last 3 results) No results for input(s): BNP in the last 8760 hours. Basic Metabolic Panel: Recent Labs  Lab 02/16/18 0357 02/17/18 0446 02/18/18 0320  02/19/18 0315 02/21/18 0354  NA 139 140 139 137 139  K 2.7* 3.6  3.4* 3.5 4.0  CL 110 109 107 105 109  CO2 20* 24 22 25 23   GLUCOSE 107* 123* 113* 144* 136*  BUN 25* 35* 34* 30* 27*  CREATININE 1.00 1.09 0.93 1.00 0.64  CALCIUM 7.7* 8.1* 7.8* 7.9* 8.0*  MG 2.1 2.1 2.0 2.0  --   PHOS 2.5 1.9*  --  2.4*  --    Liver Function Tests: Recent Labs  Lab 02/14/18 1712 02/18/18 0320 02/19/18 0315  AST 44* 40 44*  ALT 31 25 32  ALKPHOS 94 55 58  BILITOT 0.6 1.0 0.8  PROT 6.8 5.4* 5.5*  ALBUMIN 3.0* 2.2* 2.2*   No results for input(s): LIPASE, AMYLASE in the last 168 hours. No results for input(s): AMMONIA in the last 168 hours. CBC: Recent Labs  Lab 02/14/18 1712 02/15/18 0420 02/16/18 0357 02/17/18 0446 02/18/18 0320 02/19/18 0315  WBC 6.1 7.1 8.1 10.5 9.2 6.7  NEUTROABS 4.6  --   --   --   --   --   HGB 11.5* 10.0* 10.1* 10.0* 9.7* 9.9*  HCT 34.6* 29.1* 29.8* 30.1* 28.9* 29.4*  MCV 92.8 90.1 90.6 91.8 92.0 92.5  PLT 395 260 212 244 227 242   Cardiac Enzymes: Recent Labs  Lab 02/15/18 0032 02/15/18 0352 02/15/18 1125 02/16/18 0357 02/17/18 0446  TROPONINI 12.65* 16.47* 21.08* 14.54* 7.00*   BNP: Invalid input(s): POCBNP CBG: Recent Labs  Lab 02/20/18 1707 02/20/18 2015 02/20/18 2354 02/21/18 0413 02/21/18 0812  GLUCAP 149* 132* 126* 146* 129*   D-Dimer No results for input(s): DDIMER in the last 72 hours. Hgb A1c No results for input(s): HGBA1C in the last 72 hours. Lipid Profile No results for input(s): CHOL, HDL, LDLCALC, TRIG, CHOLHDL, LDLDIRECT in the last 72 hours. Thyroid function studies No results for input(s): TSH, T4TOTAL, T3FREE, THYROIDAB in the last 72 hours.  Invalid input(s): FREET3 Anemia work up No results for input(s): VITAMINB12, FOLATE, FERRITIN, TIBC, IRON, RETICCTPCT in the last 72 hours. Urinalysis    Component Value Date/Time   COLORURINE STRAW (A) 02/15/2018 0032   APPEARANCEUR CLEAR 02/15/2018 0032   LABSPEC 1.006  02/15/2018 0032   PHURINE 5.0 02/15/2018 0032   GLUCOSEU NEGATIVE 02/15/2018 0032   HGBUR MODERATE (A) 02/15/2018 0032   BILIRUBINUR NEGATIVE 02/15/2018 0032   KETONESUR NEGATIVE 02/15/2018 0032   PROTEINUR NEGATIVE 02/15/2018 0032   NITRITE NEGATIVE 02/15/2018 0032   LEUKOCYTESUR NEGATIVE 02/15/2018 0032   Sepsis Labs Invalid input(s): PROCALCITONIN,  WBC,  LACTICIDVEN Microbiology Recent Results (from the past 240 hour(s))  Aerobic Culture (superficial specimen)     Status: None   Collection Time: 02/14/18  4:15 PM  Result Value Ref Range Status   Specimen Description   Final    WOUND LEFT ABDOMEN Performed at [facility], [place] [address], [place], [place] [address]    Special Requests   Final    NONE Performed at [facility], [place] [address] [place], [place] [address]    Gram Stain   Final    NO WBC SEEN ABUNDANT GRAM NEGATIVE RODS ABUNDANT GRAM POSITIVE RODS FEW GRAM POSITIVE COCCI IN PAIRS Performed at [facility], [place] [address], [place],  [address]    Culture   Final    ABUNDANT KLEBSIELLA PNEUMONIAE ABUNDANT SERRATIA MARCESCENS    Report Status 02/17/2018 FINAL  Final   Organism ID, Bacteria KLEBSIELLA PNEUMONIAE  Final   Organism ID, Bacteria SERRATIA MARCESCENS  Final  Susceptibility   Klebsiella pneumoniae - MIC*    AMPICILLIN >=32 RESISTANT Resistant     CEFAZOLIN <=4 SENSITIVE Sensitive     CEFEPIME <=1 SENSITIVE Sensitive     CEFTAZIDIME <=1 SENSITIVE Sensitive     CEFTRIAXONE <=1 SENSITIVE Sensitive     CIPROFLOXACIN <=0.25 SENSITIVE Sensitive     GENTAMICIN <=1 SENSITIVE Sensitive     IMIPENEM <=0.25 SENSITIVE Sensitive     TRIMETH/SULFA <=20 SENSITIVE Sensitive     AMPICILLIN/SULBACTAM 4 SENSITIVE Sensitive     PIP/TAZO <=4 SENSITIVE Sensitive     Extended ESBL NEGATIVE Sensitive     * ABUNDANT KLEBSIELLA PNEUMONIAE   Serratia marcescens - MIC*    CEFAZOLIN >=64 RESISTANT Resistant     CEFEPIME  <=1 SENSITIVE Sensitive     CEFTAZIDIME <=1 SENSITIVE Sensitive     CEFTRIAXONE <=1 SENSITIVE Sensitive     CIPROFLOXACIN <=0.25 SENSITIVE Sensitive     GENTAMICIN <=1 SENSITIVE Sensitive     TRIMETH/SULFA <=20 SENSITIVE Sensitive     * ABUNDANT SERRATIA MARCESCENS  Culture, blood (Routine x 2)     Status: None   Collection Time: 02/14/18  5:12 PM  Result Value Ref Range Status   Specimen Description   Final    BLOOD PORTA CATH Performed at [place] [address] [place], [place] [address]    Special Requests   Final    BOTTLES DRAWN AEROBIC AND ANAEROBIC Blood Culture adequate volume Performed at [place] [address], [place], [place] [address]    Culture   Final    NO GROWTH 5 DAYS Performed at [facility], [place] [address] [place], [place] [address]    Report Status 02/19/2018 FINAL  Final  Culture, blood (Routine x 2)     Status: None   Collection Time: 02/14/18  5:12 PM  Result Value Ref Range Status   Specimen Description   Final    BLOOD LEFT WRIST Performed at [place] [address] [place], [place] [address]    Special Requests   Final    BOTTLES DRAWN AEROBIC AND ANAEROBIC Blood Culture adequate volume Performed at [place] [address], [place], [place] [address]    Culture   Final    NO GROWTH 5 DAYS Performed at [facility], [place] [address] [place], [place] [address]    Report Status 02/19/2018 FINAL  Final  Urine culture     Status: None   Collection Time: 02/14/18  5:12 PM  Result Value Ref Range Status   Specimen Description   Final    URINE, CATHETERIZED Performed at [facility], [place] [address] [place], [place] [address]    Special Requests   Final    Immunocompromised Performed at [facility], [place] [address] [place], [place] [address]    Culture   Final    NO GROWTH Performed at [facility], [place] [address] [place], [place] [address]    Report Status 02/16/2018 FINAL  Final  C difficile quick scan w PCR reflex     Status: None   Collection Time: 02/14/18  6:40 PM  Result Value Ref Range Status   C Diff antigen NEGATIVE NEGATIVE Final   C Diff toxin NEGATIVE NEGATIVE Final   C Diff interpretation No C. difficile detected.  Final    Comment: Performed at [facility], [place] [address] [place],  [place] [address]  Gastrointestinal Panel by PCR , Stool     Status: None   Collection Time: 02/14/18  6:40 PM  Result Value Ref Range Status   Campylobacter species NOT DETECTED NOT DETECTED Final   Plesimonas shigelloides NOT DETECTED NOT DETECTED Final   Salmonella species NOT DETECTED NOT DETECTED Final   Yersinia enterocolitica NOT DETECTED NOT DETECTED Final   Vibrio species NOT DETECTED NOT DETECTED Final   Vibrio cholerae NOT DETECTED NOT DETECTED Final   Enteroaggregative E coli (EAEC) NOT DETECTED NOT DETECTED Final   Enteropathogenic E coli (EPEC) NOT DETECTED NOT DETECTED Final   Enterotoxigenic E coli (ETEC) NOT DETECTED NOT DETECTED Final   Shiga like toxin producing E coli (STEC) NOT DETECTED NOT DETECTED Final   Shigella/Enteroinvasive E coli (EIEC) NOT DETECTED NOT DETECTED Final   Cryptosporidium NOT DETECTED NOT DETECTED Final   Cyclospora cayetanensis NOT DETECTED NOT DETECTED Final   Entamoeba histolytica NOT DETECTED NOT DETECTED Final   Giardia lamblia NOT DETECTED NOT DETECTED Final   Adenovirus F40/41 NOT DETECTED NOT DETECTED Final   Astrovirus NOT DETECTED NOT DETECTED Final   Norovirus GI/GII NOT DETECTED NOT DETECTED Final   Rotavirus A NOT DETECTED NOT DETECTED Final   Sapovirus (I, II, IV, and V) NOT DETECTED NOT DETECTED Final    Comment: Performed at [facility], [place]., [place], [place] [address]  Respiratory Panel by PCR     Status: None   Collection Time: 02/15/18  4:20 AM  Result Value Ref Range  Status   Adenovirus NOT DETECTED NOT DETECTED Final   Coronavirus 229E NOT DETECTED NOT DETECTED Final   Coronavirus HKU1 NOT DETECTED NOT DETECTED Final   Coronavirus NL63 NOT DETECTED NOT DETECTED Final   Coronavirus OC43 NOT DETECTED NOT DETECTED Final   Metapneumovirus NOT DETECTED NOT DETECTED Final   Rhinovirus / Enterovirus NOT DETECTED NOT DETECTED Final   Influenza A NOT DETECTED NOT DETECTED Final   Influenza B NOT DETECTED NOT DETECTED Final   Parainfluenza Virus 1 NOT DETECTED NOT DETECTED Final   Parainfluenza Virus 2 NOT DETECTED NOT DETECTED Final   Parainfluenza Virus 3 NOT DETECTED NOT DETECTED Final   Parainfluenza Virus 4 NOT DETECTED NOT DETECTED Final   Respiratory Syncytial Virus NOT DETECTED NOT DETECTED Final   Bordetella pertussis NOT DETECTED NOT DETECTED Final   Chlamydophila pneumoniae NOT DETECTED NOT DETECTED Final   Mycoplasma pneumoniae NOT DETECTED NOT DETECTED Final    Comment: Performed at [facility], [place], [place], [place] [address]  MRSA PCR Screening     Status: None   Collection Time: 02/15/18 12:21 PM  Result Value Ref Range Status   MRSA by PCR NEGATIVE NEGATIVE Final    Comment:        The GeneXpert MRSA Assay (FDA approved for NASAL specimens only), is one component of a comprehensive MRSA colonization surveillance program. It is not intended to diagnose MRSA infection nor to guide or monitor treatment for MRSA infections. Performed at [facility], [place] [address], [place], [place] [address]   Culture, expectorated sputum-assessment     Status: None   Collection Time: 02/15/18 11:47 PM  Result Value Ref Range Status   Specimen Description SPU  Final   Special Requests NONE  Final   Sputum evaluation   Final    THIS SPECIMEN IS ACCEPTABLE FOR SPUTUM CULTURE Performed at [facility] [place] [address] [place], [place] [address]  Report Status 02/16/2018 FINAL  Final  Culture,  respiratory (NON-Expectorated)     Status: None   Collection Time: 02/15/18 11:47 PM  Result Value Ref Range Status   Specimen Description   Final    SPU Performed at [facility], [place] [address] [place], [place] [address]    Special Requests   Final    NONE Reflexed from [id number] Performed at [place] [address] [place], [place] [address]    Gram Stain   Final    MODERATE WBC PRESENT, PREDOMINANTLY PMN NO ORGANISMS SEEN Performed at [facility], [place] [address], [place], [place] [address]    Culture RARE KLEBSIELLA PNEUMONIAE  Final   Report Status 02/18/2018 FINAL  Final   Organism ID, Bacteria KLEBSIELLA PNEUMONIAE  Final      Susceptibility   Klebsiella pneumoniae - MIC*    AMPICILLIN RESISTANT Resistant     CEFAZOLIN <=4 SENSITIVE Sensitive     CEFEPIME <=1 SENSITIVE Sensitive     CEFTAZIDIME <=1 SENSITIVE Sensitive     CEFTRIAXONE <=1 SENSITIVE Sensitive     CIPROFLOXACIN <=0.25 SENSITIVE Sensitive     GENTAMICIN <=1 SENSITIVE Sensitive     IMIPENEM <=0.25 SENSITIVE Sensitive     TRIMETH/SULFA <=20 SENSITIVE Sensitive     AMPICILLIN/SULBACTAM 4 SENSITIVE Sensitive     PIP/TAZO <=4 SENSITIVE Sensitive     Extended ESBL NEGATIVE Sensitive     * RARE KLEBSIELLA PNEUMONIAE     Time coordinating discharge: 40 minutes  SIGNED:   Aline August, MD  [facility] 02/21/2018, 11:48 AM Pager: [phone]  If 7PM-7AM, please contact night-coverage [email or web address] Password [email or web address]

## 2018-02-21 NOTE — Progress Notes (Signed)
After speaking with Dr.Golding about home with hospice for pt. Spoke with pt's son Will concerning discharge plan and options for Hospice. [facility] was [facility]. Referral was called to [facility].In house rep will come see pt and family.

## 2018-02-21 NOTE — Progress Notes (Addendum)
Progress Note  Patient Name: Lee Pratt Date of Encounter: 02/21/2018  Primary Cardiologist: Sherren Mocha, MD   Subjective   No chest pain, no SOB, almost flat in bed.  Inpatient Medications    Scheduled Meds: . acetaminophen  650 mg Oral TID  . aspirin EC  81 mg Oral Daily  . atorvastatin  40 mg Oral q1800  . Chlorhexidine Gluconate Cloth  6 each Topical Q0600  . Chlorhexidine Gluconate Cloth  6 each Topical Daily  . clopidogrel  75 mg Oral Daily  . enoxaparin (LOVENOX) injection  40 mg Subcutaneous Q24H  . feeding supplement (ENSURE ENLIVE)  237 mL Oral TID BM  . fluconazole  100 mg Oral Daily  . lisinopril  2.5 mg Oral Daily  . metoprolol tartrate  37.5 mg Oral BID  . Zinc Oxide   Topical QID   Continuous Infusions: . sodium chloride 250 mL (02/18/18 0000)  . cefTRIAXone (ROCEPHIN)  IV Stopped (02/20/18 1515)  . feeding supplement (OSMOLITE 1.5 CAL) 1,000 mL (02/20/18 1515)   PRN Meds: sodium chloride, acetaminophen **OR** acetaminophen, HYDROcodone-acetaminophen, lidocaine, ondansetron **OR** ondansetron (ZOFRAN) IV, prochlorperazine, sodium chloride flush, Zinc Oxide, zolpidem   Vital Signs    Vitals:   02/20/18 1331 02/20/18 2019 02/21/18 0418 02/21/18 0843  BP: 103/60 110/61 (!) 105/59   Pulse: 67 78 70   Resp:  16 18   Temp: 97.7 F (36.5 C) 97.6 F (36.4 C) 98.4 F (36.9 C)   TempSrc: Oral Oral Oral   SpO2: 97% 96% 94%   Weight:    126 lb 12.2 oz (57.5 kg)  Height:        Intake/Output Summary (Last 24 hours) at 02/21/2018 0848 Last data filed at 02/21/2018 0600 Gross per 24 hour  Intake 1900 ml  Output 700 ml  Net 1200 ml   Filed Weights   02/19/18 0341 02/20/18 0700 02/21/18 0843  Weight: 116 lb 13.5 oz (53 kg) 117 lb 11.6 oz (53.4 kg) 126 lb 12.2 oz (57.5 kg)    Telemetry    SR no further SVT - Personally Reviewed  ECG    No new - Personally Reviewed  Physical Exam   GEN: No acute distress.   Neck: No JVD Cardiac: RRR, no  murmurs, rubs, or gallops.  Respiratory: Clear to auscultation bilaterally. MS: No edema; No deformity. Neuro:  Nonfocal  Psych: Normal affect   Labs    Chemistry Recent Labs  Lab 02/14/18 1712  02/18/18 0320 02/19/18 0315 02/21/18 0354  NA 139   < > 139 137 139  K 4.8   < > 3.4* 3.5 4.0  CL 105   < > 107 105 109  CO2 16*   < > 22 25 23   GLUCOSE 117*   < > 113* 144* 136*  BUN 37*   < > 34* 30* 27*  CREATININE 1.04   < > 0.93 1.00 0.64  CALCIUM 8.6*   < > 7.8* 7.9* 8.0*  PROT 6.8  --  5.4* 5.5*  --   ALBUMIN 3.0*  --  2.2* 2.2*  --   AST 44*  --  40 44*  --   ALT 31  --  25 32  --   ALKPHOS 94  --  55 58  --   BILITOT 0.6  --  1.0 0.8  --   GFRNONAA >60   < > >60 >60 >60  GFRAA >60   < > >60 >60 >60  ANIONGAP 18*   < >  10 7 7    < > = values in this interval not displayed.     Hematology Recent Labs  Lab 02/17/18 0446 02/18/18 0320 02/19/18 0315  WBC 10.5 9.2 6.7  RBC 3.28* 3.14* 3.18*  HGB 10.0* 9.7* 9.9*  HCT 30.1* 28.9* 29.4*  MCV 91.8 92.0 92.5  MCH 30.5 30.9 31.1  MCHC 33.2 33.6 33.7  RDW 18.5* 18.3* 17.8*  PLT 244 227 242    Cardiac Enzymes Recent Labs  Lab 02/15/18 0352 02/15/18 1125 02/16/18 0357 02/17/18 0446  TROPONINI 16.47* 21.08* 14.54* 7.00*    Recent Labs  Lab 02/14/18 1729  TROPIPOC 0.04     BNPNo results for input(s): BNP, PROBNP in the last 168 hours.   DDimer No results for input(s): DDIMER in the last 168 hours.   Radiology    No results found.  Cardiac Studies   Echo 02/15/18: Study Conclusions  - Left ventricle: The cavity size was normal. There was mild concentric hypertrophy. Systolic function was moderately reduced. The estimated ejection fraction was in the range of 35% to 40%. Anterior, anteroseptal, apical and inferoapical severe hypokinesis to akinesis, suggestive of LAD territory ischemia/infarct. Doppler parameters are consistent with pseudonormal left ventricular relaxation (grade 2  diastolic dysfunction). LV filling pressure is elevated. - Aortic valve: Mildly calcified with mild stenosis. There was mild regurgitation. Valve area (Vmax): 1.67 cm^2. - Mitral valve: Mildly thickened leaflets . There was mild regurgitation. - Left atrium: The atrium was normal in size. - Right ventricle: The cavity size was mildly dilated. Mild hypokinesis. - Tricuspid valve: There was mild regurgitation. - Pulmonary arteries: PA peak pressure: 47 mm Hg (S). - Inferior vena cava: The vessel was dilated. The respirophasic diameter changes were blunted (<50%), consistent with elevated central venous pressure.  Impressions:  - Compared to a prior study in 2012, there has been a significant reduction in LVEF to 35-40% with severe LAD territory wall motion abnormality which is new, suggestive of ischemia or infarct - there is moderate diastolic dysfunction with elevated LV filling pressure.    Patient Profile     84 y.o. male with metastatic gastric cancer , HTN admitted02/14with weakness , chills.Found to have ST elevation anterior MI. Not a cath candidate due to metastatic cancer.  Assessment & Plan    1. STEMI: Ant wall MI, continue ASA 81 mg and Plavix 75 mg.   Lopressor 37.5 BID, Lisinopril 2.5 mg daily   Follow up with Cardiology if problems occur.  Pt may be hospice or may go to [facility].  Will ask them to make appt if needed.     2. SVT: no further episodes continue BB.  3. Ischemic DCM: Echo with EF 35-40%  He is +4695 since admit and wt varies.  Yesterday 117 and today 126.  If edema present we prescribed 20 mg lasix daily PRN  4. Gastric Cancer: chemo on hold, nutrition through G tube - Continue care per primary team--plan for d/c with Hospice.   5.  HLD on statin      For questions or updates, please contact [facility] Please consult [email or web address] for contact info under Cardiology/STEMI.      Signed, Cecilie Kicks, NP    02/21/2018, 8:48 AM    Cachectic chinese male. No chest pain or dyspnea anterior MI Rx conservatively due to metastatic cancer Continue ASA/Plavix ACE and beta blocker for decrease EF exam with clear lungs port a cath left subclavian No murmur soft abdomen no edema. Son will likely  take to [facility] and can arrange cardiology f/u there But he is hospice patient so may not be needed  [employer]

## 2018-02-21 NOTE — Progress Notes (Signed)
Physical Therapy Treatment Patient Details Name: Lee Pratt MRN: [id number] DOB: 1934-02-03 Today's Date: 02/21/2018    History of Present Illness 84 yo male admitted with gastric outlet obstruction. Found to have gastric adenocarcinoma. S/P gastrojejunostomy, G tube placement, J tube placement 11/12/17. Admitted 02/14/18 with MI, PNA and J tube site wound infection.     PT Comments    Pt tolerated session well today. Contact gurad to ambulate in hallway with RW. Pt able to ambulate with nursing staff or family .    Follow Up Recommendations  Home health PT     Equipment Recommendations  None recommended by PT    Recommendations for Other Services       Precautions / Restrictions Precautions Precautions: None Precaution Comments: G tube  Restrictions Weight Bearing Restrictions: No    Mobility  Bed Mobility Overal bed mobility: Modified Independent             General bed mobility comments: HOB up, used rail  Transfers Overall transfer level: Needs assistance Equipment used: Rolling walker (2 wheeled) Transfers: Sit to/from Stand Sit to Stand: Min guard         General transfer comment: VCs hand placement, min/guard safety  Ambulation/Gait Ambulation/Gait assistance: Min guard Ambulation Distance (Feet): 100 Feet Assistive device: Rolling walker (2 wheeled) Gait Pattern/deviations: Step-through pattern;Decreased stride length     General Gait Details: totlerated well, limited due to following with IV pole. No LOB, no assist, just CGA in case he felt weak. Pt deined any feelings of weakness stating "I feel strong"    Stairs            Wheelchair Mobility    Modified Rankin (Stroke Patients Only)       Balance                                            Cognition Arousal/Alertness: Awake/alert Behavior During Therapy: WFL for tasks assessed/performed Overall Cognitive Status: Within Functional Limits for tasks assessed                                         Exercises Other Exercises Other Exercises: pt perfromed a few exercises bedside. Educated pt's son to continue to ambulate with pt, SLR in bed, heelslides in bed and knee extensions at edge of bed while sitting for strengthening. Son agree and aware.     General Comments        Pertinent Vitals/Pain Pain Assessment: No/denies pain    Home Living                      Prior Function            PT Goals (current goals can now be found in the care plan section) Acute Rehab PT Goals Patient Stated Goal: likes to walk outside PT Goal Formulation: With patient Time For Goal Achievement: 03/05/18 Potential to Achieve Goals: Good Progress towards PT goals: Progressing toward goals    Frequency    Min 3X/week      PT Plan      Co-evaluation              AM-PAC PT "6 Clicks" Daily Activity  Outcome Measure  Difficulty turning over in bed (including adjusting bedclothes, sheets  and blankets)?: A Little Difficulty moving from lying on back to sitting on the side of the bed? : A Little Difficulty sitting down on and standing up from a chair with arms (e.g., wheelchair, bedside commode, etc,.)?: A Little Help needed moving to and from a bed to chair (including a wheelchair)?: A Little Help needed walking in hospital room?: A Little Help needed climbing 3-5 steps with a railing? : A Lot 6 Click Score: 17    End of Session   Activity Tolerance: Patient tolerated treatment well;Other (comment)(leaning feeding tube, held and disconnected for ambulation ) Patient left: in chair;with call bell/phone within reach;with family/visitor present Nurse Communication: Mobility status;Other (comment) PT Visit Diagnosis: Difficulty in walking, not elsewhere classified (R26.2)     Time: 1100-1120 PT Time Calculation (min) (ACUTE ONLY): 20 min  Charges:  $Gait Training: 8-22 mins                    G Codes:        Lee Pratt, PT Pager: [phone] 02/21/2018    Lee Pratt, Gatha Mayer 02/21/2018, 12:38 PM

## 2018-02-22 DIAGNOSIS — C786 Secondary malignant neoplasm of retroperitoneum and peritoneum: Secondary | ICD-10-CM | POA: Diagnosis not present

## 2018-02-22 DIAGNOSIS — Z431 Encounter for attention to gastrostomy: Secondary | ICD-10-CM | POA: Diagnosis not present

## 2018-02-22 DIAGNOSIS — I2109 ST elevation (STEMI) myocardial infarction involving other coronary artery of anterior wall: Secondary | ICD-10-CM | POA: Diagnosis not present

## 2018-02-22 DIAGNOSIS — Z87891 Personal history of nicotine dependence: Secondary | ICD-10-CM | POA: Diagnosis not present

## 2018-02-22 DIAGNOSIS — I2510 Atherosclerotic heart disease of native coronary artery without angina pectoris: Secondary | ICD-10-CM | POA: Diagnosis not present

## 2018-02-22 DIAGNOSIS — C169 Malignant neoplasm of stomach, unspecified: Secondary | ICD-10-CM | POA: Diagnosis not present

## 2018-02-22 DIAGNOSIS — Z452 Encounter for adjustment and management of vascular access device: Secondary | ICD-10-CM | POA: Diagnosis not present

## 2018-02-22 DIAGNOSIS — Z434 Encounter for attention to other artificial openings of digestive tract: Secondary | ICD-10-CM | POA: Diagnosis not present

## 2018-02-22 DIAGNOSIS — I10 Essential (primary) hypertension: Secondary | ICD-10-CM | POA: Diagnosis not present

## 2018-02-22 DIAGNOSIS — K311 Adult hypertrophic pyloric stenosis: Secondary | ICD-10-CM | POA: Diagnosis not present

## 2018-02-23 DIAGNOSIS — I2510 Atherosclerotic heart disease of native coronary artery without angina pectoris: Secondary | ICD-10-CM | POA: Diagnosis not present

## 2018-02-23 DIAGNOSIS — K311 Adult hypertrophic pyloric stenosis: Secondary | ICD-10-CM | POA: Diagnosis not present

## 2018-02-23 DIAGNOSIS — Z431 Encounter for attention to gastrostomy: Secondary | ICD-10-CM | POA: Diagnosis not present

## 2018-02-23 DIAGNOSIS — Z434 Encounter for attention to other artificial openings of digestive tract: Secondary | ICD-10-CM | POA: Diagnosis not present

## 2018-02-23 DIAGNOSIS — C169 Malignant neoplasm of stomach, unspecified: Secondary | ICD-10-CM | POA: Diagnosis not present

## 2018-02-23 DIAGNOSIS — C786 Secondary malignant neoplasm of retroperitoneum and peritoneum: Secondary | ICD-10-CM | POA: Diagnosis not present

## 2018-02-24 DIAGNOSIS — Z434 Encounter for attention to other artificial openings of digestive tract: Secondary | ICD-10-CM | POA: Diagnosis not present

## 2018-02-24 DIAGNOSIS — Z431 Encounter for attention to gastrostomy: Secondary | ICD-10-CM | POA: Diagnosis not present

## 2018-02-24 DIAGNOSIS — K311 Adult hypertrophic pyloric stenosis: Secondary | ICD-10-CM | POA: Diagnosis not present

## 2018-02-24 DIAGNOSIS — C169 Malignant neoplasm of stomach, unspecified: Secondary | ICD-10-CM | POA: Diagnosis not present

## 2018-02-24 DIAGNOSIS — C786 Secondary malignant neoplasm of retroperitoneum and peritoneum: Secondary | ICD-10-CM | POA: Diagnosis not present

## 2018-02-24 DIAGNOSIS — I2510 Atherosclerotic heart disease of native coronary artery without angina pectoris: Secondary | ICD-10-CM | POA: Diagnosis not present

## 2018-02-25 DIAGNOSIS — C786 Secondary malignant neoplasm of retroperitoneum and peritoneum: Secondary | ICD-10-CM | POA: Diagnosis not present

## 2018-02-25 DIAGNOSIS — Z434 Encounter for attention to other artificial openings of digestive tract: Secondary | ICD-10-CM | POA: Diagnosis not present

## 2018-02-25 DIAGNOSIS — K311 Adult hypertrophic pyloric stenosis: Secondary | ICD-10-CM | POA: Diagnosis not present

## 2018-02-25 DIAGNOSIS — Z431 Encounter for attention to gastrostomy: Secondary | ICD-10-CM | POA: Diagnosis not present

## 2018-02-25 DIAGNOSIS — C169 Malignant neoplasm of stomach, unspecified: Secondary | ICD-10-CM | POA: Diagnosis not present

## 2018-02-25 DIAGNOSIS — I2510 Atherosclerotic heart disease of native coronary artery without angina pectoris: Secondary | ICD-10-CM | POA: Diagnosis not present

## 2018-02-26 ENCOUNTER — Ambulatory Visit: Payer: Medicare Other | Admitting: Hematology

## 2018-02-26 ENCOUNTER — Other Ambulatory Visit: Payer: Medicare Other

## 2018-02-26 ENCOUNTER — Ambulatory Visit: Payer: Medicare Other

## 2018-02-26 DIAGNOSIS — Z434 Encounter for attention to other artificial openings of digestive tract: Secondary | ICD-10-CM | POA: Diagnosis not present

## 2018-02-26 DIAGNOSIS — C169 Malignant neoplasm of stomach, unspecified: Secondary | ICD-10-CM | POA: Diagnosis not present

## 2018-02-26 DIAGNOSIS — C786 Secondary malignant neoplasm of retroperitoneum and peritoneum: Secondary | ICD-10-CM | POA: Diagnosis not present

## 2018-02-26 DIAGNOSIS — K311 Adult hypertrophic pyloric stenosis: Secondary | ICD-10-CM | POA: Diagnosis not present

## 2018-02-26 DIAGNOSIS — Z431 Encounter for attention to gastrostomy: Secondary | ICD-10-CM | POA: Diagnosis not present

## 2018-02-26 DIAGNOSIS — I2510 Atherosclerotic heart disease of native coronary artery without angina pectoris: Secondary | ICD-10-CM | POA: Diagnosis not present

## 2018-02-27 DIAGNOSIS — K311 Adult hypertrophic pyloric stenosis: Secondary | ICD-10-CM | POA: Diagnosis not present

## 2018-02-27 DIAGNOSIS — I2510 Atherosclerotic heart disease of native coronary artery without angina pectoris: Secondary | ICD-10-CM | POA: Diagnosis not present

## 2018-02-27 DIAGNOSIS — C786 Secondary malignant neoplasm of retroperitoneum and peritoneum: Secondary | ICD-10-CM | POA: Diagnosis not present

## 2018-02-27 DIAGNOSIS — C169 Malignant neoplasm of stomach, unspecified: Secondary | ICD-10-CM | POA: Diagnosis not present

## 2018-02-27 DIAGNOSIS — Z434 Encounter for attention to other artificial openings of digestive tract: Secondary | ICD-10-CM | POA: Diagnosis not present

## 2018-02-27 DIAGNOSIS — Z431 Encounter for attention to gastrostomy: Secondary | ICD-10-CM | POA: Diagnosis not present

## 2018-02-28 DIAGNOSIS — I2109 ST elevation (STEMI) myocardial infarction involving other coronary artery of anterior wall: Secondary | ICD-10-CM | POA: Diagnosis not present

## 2018-02-28 DIAGNOSIS — Z434 Encounter for attention to other artificial openings of digestive tract: Secondary | ICD-10-CM | POA: Diagnosis not present

## 2018-02-28 DIAGNOSIS — Z431 Encounter for attention to gastrostomy: Secondary | ICD-10-CM | POA: Diagnosis not present

## 2018-02-28 DIAGNOSIS — C169 Malignant neoplasm of stomach, unspecified: Secondary | ICD-10-CM | POA: Diagnosis not present

## 2018-02-28 DIAGNOSIS — I2510 Atherosclerotic heart disease of native coronary artery without angina pectoris: Secondary | ICD-10-CM | POA: Diagnosis not present

## 2018-02-28 DIAGNOSIS — I10 Essential (primary) hypertension: Secondary | ICD-10-CM | POA: Diagnosis not present

## 2018-02-28 DIAGNOSIS — Z87891 Personal history of nicotine dependence: Secondary | ICD-10-CM | POA: Diagnosis not present

## 2018-02-28 DIAGNOSIS — C786 Secondary malignant neoplasm of retroperitoneum and peritoneum: Secondary | ICD-10-CM | POA: Diagnosis not present

## 2018-02-28 DIAGNOSIS — Z452 Encounter for adjustment and management of vascular access device: Secondary | ICD-10-CM | POA: Diagnosis not present

## 2018-02-28 DIAGNOSIS — K311 Adult hypertrophic pyloric stenosis: Secondary | ICD-10-CM | POA: Diagnosis not present

## 2018-03-03 DIAGNOSIS — I2510 Atherosclerotic heart disease of native coronary artery without angina pectoris: Secondary | ICD-10-CM | POA: Diagnosis not present

## 2018-03-03 DIAGNOSIS — Z431 Encounter for attention to gastrostomy: Secondary | ICD-10-CM | POA: Diagnosis not present

## 2018-03-03 DIAGNOSIS — K311 Adult hypertrophic pyloric stenosis: Secondary | ICD-10-CM | POA: Diagnosis not present

## 2018-03-03 DIAGNOSIS — C786 Secondary malignant neoplasm of retroperitoneum and peritoneum: Secondary | ICD-10-CM | POA: Diagnosis not present

## 2018-03-03 DIAGNOSIS — C169 Malignant neoplasm of stomach, unspecified: Secondary | ICD-10-CM | POA: Diagnosis not present

## 2018-03-03 DIAGNOSIS — Z434 Encounter for attention to other artificial openings of digestive tract: Secondary | ICD-10-CM | POA: Diagnosis not present

## 2018-03-04 ENCOUNTER — Telehealth: Payer: Self-pay | Admitting: *Deleted

## 2018-03-04 NOTE — Telephone Encounter (Signed)
Received call from son Will Frenette asking for a referral to [place] for pt.   Will stated pt will need an oncologist in [place] as pt will be moving there at end of month to be near sons. [place]     Phone       [phone].

## 2018-03-05 DIAGNOSIS — Z434 Encounter for attention to other artificial openings of digestive tract: Secondary | ICD-10-CM | POA: Diagnosis not present

## 2018-03-05 DIAGNOSIS — Z431 Encounter for attention to gastrostomy: Secondary | ICD-10-CM | POA: Diagnosis not present

## 2018-03-05 DIAGNOSIS — K311 Adult hypertrophic pyloric stenosis: Secondary | ICD-10-CM | POA: Diagnosis not present

## 2018-03-05 DIAGNOSIS — C169 Malignant neoplasm of stomach, unspecified: Secondary | ICD-10-CM | POA: Diagnosis not present

## 2018-03-05 DIAGNOSIS — I2510 Atherosclerotic heart disease of native coronary artery without angina pectoris: Secondary | ICD-10-CM | POA: Diagnosis not present

## 2018-03-05 DIAGNOSIS — C786 Secondary malignant neoplasm of retroperitoneum and peritoneum: Secondary | ICD-10-CM | POA: Diagnosis not present

## 2018-03-06 DIAGNOSIS — Z431 Encounter for attention to gastrostomy: Secondary | ICD-10-CM | POA: Diagnosis not present

## 2018-03-06 DIAGNOSIS — C169 Malignant neoplasm of stomach, unspecified: Secondary | ICD-10-CM | POA: Diagnosis not present

## 2018-03-06 DIAGNOSIS — K311 Adult hypertrophic pyloric stenosis: Secondary | ICD-10-CM | POA: Diagnosis not present

## 2018-03-06 DIAGNOSIS — Z434 Encounter for attention to other artificial openings of digestive tract: Secondary | ICD-10-CM | POA: Diagnosis not present

## 2018-03-06 DIAGNOSIS — I2510 Atherosclerotic heart disease of native coronary artery without angina pectoris: Secondary | ICD-10-CM | POA: Diagnosis not present

## 2018-03-06 DIAGNOSIS — C786 Secondary malignant neoplasm of retroperitoneum and peritoneum: Secondary | ICD-10-CM | POA: Diagnosis not present

## 2018-03-07 DIAGNOSIS — C169 Malignant neoplasm of stomach, unspecified: Secondary | ICD-10-CM | POA: Diagnosis not present

## 2018-03-07 DIAGNOSIS — K311 Adult hypertrophic pyloric stenosis: Secondary | ICD-10-CM | POA: Diagnosis not present

## 2018-03-07 DIAGNOSIS — I2510 Atherosclerotic heart disease of native coronary artery without angina pectoris: Secondary | ICD-10-CM | POA: Diagnosis not present

## 2018-03-07 DIAGNOSIS — C786 Secondary malignant neoplasm of retroperitoneum and peritoneum: Secondary | ICD-10-CM | POA: Diagnosis not present

## 2018-03-07 DIAGNOSIS — Z434 Encounter for attention to other artificial openings of digestive tract: Secondary | ICD-10-CM | POA: Diagnosis not present

## 2018-03-07 DIAGNOSIS — Z431 Encounter for attention to gastrostomy: Secondary | ICD-10-CM | POA: Diagnosis not present

## 2018-03-08 DIAGNOSIS — C786 Secondary malignant neoplasm of retroperitoneum and peritoneum: Secondary | ICD-10-CM | POA: Diagnosis not present

## 2018-03-08 DIAGNOSIS — Z434 Encounter for attention to other artificial openings of digestive tract: Secondary | ICD-10-CM | POA: Diagnosis not present

## 2018-03-08 DIAGNOSIS — Z431 Encounter for attention to gastrostomy: Secondary | ICD-10-CM | POA: Diagnosis not present

## 2018-03-08 DIAGNOSIS — I2510 Atherosclerotic heart disease of native coronary artery without angina pectoris: Secondary | ICD-10-CM | POA: Diagnosis not present

## 2018-03-08 DIAGNOSIS — K311 Adult hypertrophic pyloric stenosis: Secondary | ICD-10-CM | POA: Diagnosis not present

## 2018-03-08 DIAGNOSIS — C169 Malignant neoplasm of stomach, unspecified: Secondary | ICD-10-CM | POA: Diagnosis not present

## 2018-03-10 DIAGNOSIS — Z434 Encounter for attention to other artificial openings of digestive tract: Secondary | ICD-10-CM | POA: Diagnosis not present

## 2018-03-10 DIAGNOSIS — C169 Malignant neoplasm of stomach, unspecified: Secondary | ICD-10-CM | POA: Diagnosis not present

## 2018-03-10 DIAGNOSIS — K311 Adult hypertrophic pyloric stenosis: Secondary | ICD-10-CM | POA: Diagnosis not present

## 2018-03-10 DIAGNOSIS — Z431 Encounter for attention to gastrostomy: Secondary | ICD-10-CM | POA: Diagnosis not present

## 2018-03-10 DIAGNOSIS — C786 Secondary malignant neoplasm of retroperitoneum and peritoneum: Secondary | ICD-10-CM | POA: Diagnosis not present

## 2018-03-10 DIAGNOSIS — I2510 Atherosclerotic heart disease of native coronary artery without angina pectoris: Secondary | ICD-10-CM | POA: Diagnosis not present

## 2018-03-10 NOTE — Progress Notes (Signed)
Lee Pratt  [phone] Fax:([phone]) [phone]  Clinic Follow up Note   Lee Pratt Care Team: [name], MD as PCP - General (Internal Medicine) Sherren Mocha, MD as PCP - Cardiology (Cardiology) [name], MD as Consulting Physician (General Surgery) [name], MD as Consulting Physician (Hematology) [name], NP as Nurse Practitioner (Nurse Practitioner)   Date of Service:  03/12/2018  CHIEF COMPLAINT: F/u metastatic gastric cancer   SUMMARY OF ONCOLOGIC HISTORY: Oncology History   Cancer Staging Gastric cancer [facility]) Staging form: Stomach, AJCC 8th Edition - Clinical stage from 11/10/2017: Stage IVB (cTX, cNX, pM1) - Signed by [name], MD on 02/10/2018 - Pathologic: pM1 - Unsigned       Gastric cancer ([place])   11/09/2017 Imaging    CT A/P IMPRESSION: 1. Circumferential wall thickening involving the gastric antrum with marked distension of the stomach suggestive of an element of gastric outlet obstruction. Further evaluation with endoscopy is recommended. 2. Short-segment occlusion involving the left lower lobe lateral basal subsegmental bronchus, nonspecific though could be indicative of aspiration in the setting of suspected gastric outlet obstruction. Clinical correlation is advised. 3. Small amount of fluid seen with the pelvic cul-de-sac, presumably reactive. 4. Mild ectasia of the abdominal aorta measuring 2.7 cm in diameter. Recommend follow-up aortic ultrasound in 5 years. This recommendation follows ACR consensus guidelines: White Paper of the ACR Incidental Findings Committee II on Vascular Findings. J Am Coll Radiol 2013; 23:536-144. 5. Aortic Atherosclerosis (ICD10-I70.0). Suspected hemodynamically significant stenoses involving the bilateral external iliac arteries and the right common femoral artery. Correlation for lower extremity PAD symptoms is recommended. Further evaluation with the acquisition of ABIs could  be performed as indicated.      11/10/2017 Procedure    Upper endoscopy Findings: per Dr. Hilarie Fredrickson Mild esophagitis with no bleeding was found in the lower third of the esophagus. This is likely from reflux in the setting of partial gastric outlet obstruction. Retained fluid and food debris was found in the gastric body. This was aspirated via the suction channel of the endoscope but was unable to be fully cleared due to semi-solid material. 1800 mL was removed. Diffuse moderate inflammation characterized by erythema and granularity was found in the cardia, in the gastric fundus and in the gastric body. A large, fungating and friable mass with no active bleeding was found at the incisura and in the gastric antrum. The mass was covered with mucus. This was biopsied extensively with a cold forceps for histology. Attempt made to maneuver around this mass and find the pylorus/duodenum, however given retained food/food in the stomach and lack of airway protection, this was not possible. An examination of the duodenum was not performed.      11/10/2017 Pathology Results    Diagnosis Stomach, biopsy, gastric mass - ADENOCARCINOMA - SEE COMMENT  Results: HER2 - NEGATIVE RATIO OF HER2/CEP17 SIGNALS 1.92 AVERAGE HER2 COPY NUMBER PER CELL 2.45  Microscopic Comment Based on the biopsy the carcinoma is well-differentiated. Dr. Gari Crown reviewed the case and agrees with the above diagnosis. Dr. Hilarie Fredrickson was notified of these results on November 12, 2017. Her-2 is pending and will be reported in an addendum. Warthin-Starry is negative for H. pylori. 1 of      11/12/2017 Pathology Results    Diagnosis Peritoneum, biopsy - METASTATIC ADENOCARCINOMA. Microscopic Comment The morphologic appearance is similar to previously diagnosed gastric adenocarcinoma ([id number]). (BNS:ah/gt, 11/13/17)      11/12/2017 Surgery    PROCEDURE:   Diagnostic Laparoscopy  with biopsy, gastojejunostomy, insertion of  G-tube and J-tube by Dr. Marcello Moores  Findings: Peritoneal nodules were noted along the surface of the gastric antrum and duodenum.  There was also nodules noted on the falciform ligament and on the peritoneum over the right side of the liver          11/19/2017 Initial Diagnosis    Gastric cancer ([place])      11/19/2017 Imaging    CT CHEST IMPRESSION: 1. No evidence of metastatic disease to the thorax. 2. Sequela of old granulomatous disease, as above. 3. Aortic atherosclerosis, in addition to left main and 3 vessel coronary artery disease. 4. Ectasia of the ascending thoracic aorta (4 cm in diameter). Recommend annual imaging followup by CTA or MRA. This recommendation follows 2010 ACCF/AHA/AATS/ACR/ASA/SCA/SCAI/SIR/STS/SVM Guidelines for the Diagnosis and Management of Patients with Thoracic Aortic Disease. Circulation. 2010; 121: A309-M076. 5. There are calcifications of the aortic valve. Echocardiographic correlation for evaluation of potential valvular dysfunction may be warranted if clinically indicated. 6. Small volume of pneumoperitoneum noted in the visualized upper abdomen, presumably related to recent gastrojejunostomy performed on 11/12/2017. However, clinical correlation is recommended to exclude any signs are symptoms concerning for perforation.      11/20/2017 Procedure    PAC placement      12/18/2017 -  Chemotherapy    FOLFOX every 2 weeks for 6 cycles starting 12/191/8        02/10/2018 Imaging    IMPRESSION: 1. New patchy pulmonary airspace opacities within the right lower lobe, likely infectious/inflammatory. 2. Interval gastrojejunostomy with resolution of gastric distention. The gastrojejunostomy is patent. Percutaneous jejunal feeding tube retention balloon is displaced peripherally into the anterior abdominal wall and is at risk for becoming dislodged. 3. No evidence of metastatic gastric cancer. Distal gastric wall thickening noted on the previous  studies has improved. 4. Extensive atherosclerosis, including Aortic Atherosclerosis (ICD10-I70.0). 5. Underlying chronic lung disease with pleuroparenchymal scarring asymmetric to the right. 6. These results will be called to the ordering clinician or representative by the Radiologist Assistant, and communication documented in the PACS or zVision Dashboard.       02/14/2018 - 02/21/2018 Hospital Admission    Pt experienced emesis and dehydration after Cycle 6 of chemo. He was evaluated at his pump discharge by Lucianne Lei on 02/14/18. He was subsequently admitted to [facility], presented with fever and diagnosed with sepsis, STEMI, and PNA.      History of present illness:    Lee Pratt was seen in the hospital due to symptomatic anemia and abdominal pain. He reports that he initially had symptoms of decreased appetite, weight loss of 10-20 lbs, and intermittent abdominal pain. Wife reports the Lee Pratt having emesis. He reports that his energy levels in the past couple of months has declined. Wife reports that the Lee Pratt has had generalized weakness. He notes that for 2 months he has had these symptoms and that prior to that his energy level was good and he used to walk everyday. He denies doing anything else besides walking prior to his recent diagnosis. Wife reports that the Lee Pratt has a hx of HTN and only takes ASA. He denies MI or any other medical issues. 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 He has two children who live in [place]. Wife states that they moved to [place] due to the cold weather and they do not know any family members locally. Wife reports that they go to church, but they do  not drive.   He had a CT AP completed on 11/09/2017 with results of: IMPRESSION: 1. Circumferential wall thickening involving the gastric antrum with marked distension of the stomach suggestive of an element of gastric outlet obstruction. Further evaluation with endoscopy is recommended. 2.  Short-segment occlusion involving the left lower lobe lateral basal subsegmental bronchus, nonspecific though could be indicative of aspiration in the setting of suspected gastric outlet obstruction. Clinical correlation is advised. 3. Small amount of fluid seen with the pelvic cul-de-sac, presumably reactive. 4. Mild ectasia of the abdominal aorta measuring 2.7 cm in diameter. Recommend follow-up aortic ultrasound in 5 years. This recommendation follows ACR consensus guidelines: White Paper of the ACR Incidental Findings Committee II on Vascular Findings. J Am Coll Radiol 2013; 75:102-585. 5. Aortic Atherosclerosis (ICD10-I70.0). Suspected hemodynamically significant stenoses involving the bilateral external iliac arteries and the right common femoral artery. Correlation for lower extremity PAD symptoms is recommended. Further evaluation with the acquisition of ABIs could be performed as indicated.  He was evaluated in [facility] and admitted for abdominal pain, anemia, and partial gastric outlet obstruction on 11/09/2017. He also had weight loss and dark colored stool during his ED visit on 11/09/2017 prior to being admitted to the hospital. He had an EGD completed on 11/10/2017 which was performed by Dr. Zenovia Jarred with pathology results showing: adenocarcinoma of gastric mass. Subsequently, he had a diagnostic larproscopic with biopsy, gastrojejunostomy, insertion of G and J tube performed by Dr. [name] on 27/78/2423 with pathology results of peritoneum, biopsy with metastatic adenocarcinoma. During his admission, he has received IV feraheme twice on 11/12/2017 and 11/15/2017 due to symptomatic anemia.     CURRENT THERAPY: first line FOLFOX every 2 weeks started 12/18/17. Discontinued after hospital admittance on 02/14/18, currently under Hospice care   INTERVAL HISTORY:  Lee Pratt returns is here for a follow up. He presents to the clinic room today accompanied by his son. His colostomy bag was removed  and he is healing well. He currently using a G-tube for feeding and getting 25 mL per hour. He has been eating soup and had some chicken yesterday. He has been having some regurgitation and was prescribed reglan a few days ago. His energy has improved since stopping mirtazapine. He is sleeping with an incline. His son notes that he has a productive cough. He has a bowel movement every 3 days. He does not feel constipated.   He was discontinued metoprolol and lisinopril at discharge from the hospital.   On review of systems, pt denies pain, headaches, dizziness or any other complaints at this time. Pertinent positives are listed and detailed within the above HPI.   REVIEW OF SYSTEMS:  Constitutional: Denies fevers, chills (+) loss 7 pounds.  (+) low food intake   Ears, nose, mouth, throat, and face: Denies mucositis or sore throat (+) hard of hearing  Respiratory: Denies dyspnea or wheezes Cardiovascular: Denies palpitation, chest discomfort or lower extremity swelling Gastrointestinal:  Denies emesis, diarrhea, dysphagia, bloating, early satiety, heartburn, abdominal pain, or blood in stool  (+) RLQ PEG and J tube (+) nausea Skin: Denies abnormal skin rashes Lymphatics: Denies new lymphadenopathy or easy bruising Neurological:Denies numbness, tingling or new weaknesses Behavioral/Psych: Mood is stable, no new changes  All other systems were reviewed with the Lee Pratt and are negative.  MEDICAL HISTORY:  Past Medical History:  Diagnosis Date  . Hyperglycemia 03/16/2013  . Hypertension   . Stomach cancer [facility])     SURGICAL HISTORY: Past Surgical History:  Procedure Laterality Date  . ESOPHAGOGASTRODUODENOSCOPY N/A 11/10/2017   Procedure: ESOPHAGOGASTRODUODENOSCOPY (EGD);  Surgeon: Jerene Bears, MD;  Location: [facility];  Service: Gastroenterology;  Laterality: N/A;  . GASTROJEJUNOSTOMY N/A 11/12/2017   Procedure: Diagnostic Laparoscopy with biopsy, gastojejunostomy, insertion of  G-tube and J-tube;  Surgeon: [name], MD;  Location: [facility];  Service: General;  Laterality: N/A;  . NO PAST SURGERIES  12/21/2011  . PORTACATH PLACEMENT Left 11/20/2017   Procedure: INSERTION PORT-A-CATH;  Surgeon: Ralene Ok, MD;  Location: [facility];  Service: General;  Laterality: Left;    I have reviewed the social history and family history with the Lee Pratt and they are unchanged from previous note.  ALLERGIES:  has No Known Allergies.  MEDICATIONS:  Current Outpatient Medications  Medication Sig Dispense Refill  . atorvastatin (LIPITOR) 40 MG tablet Take 1 tablet (40 mg total) by mouth daily at 6 PM. 30 tablet 6  . clopidogrel (PLAVIX) 75 MG tablet Take 1 tablet (75 mg total) by mouth daily. 30 tablet 6  . docusate sodium (COLACE) 100 MG capsule Take 1 capsule (100 mg total) by mouth 2 (two) times daily as needed for mild constipation. 30 capsule 1  . lidocaine-prilocaine (EMLA) cream Apply to affected area once 30 g 3  . metoCLOPramide (REGLAN) 5 MG tablet Take 5 mg by mouth every 6 (six) hours as needed for nausea.    . ondansetron (ZOFRAN ODT) 8 MG disintegrating tablet Take 1 tablet (8 mg total) by mouth 2 (two) times daily as needed for nausea or vomiting. 20 tablet 1  . ondansetron (ZOFRAN) 8 MG tablet Take 1 tablet (8 mg total) by mouth 2 (two) times daily as needed for refractory nausea / vomiting. Start on day 3 after chemotherapy. 30 tablet 1  . feeding supplement, ENSURE ENLIVE, (ENSURE ENLIVE) LIQD Take 237 mLs by mouth 3 (three) times daily between meals. (Lee Pratt not taking: Reported on 03/12/2018) 237 mL 12  . haloperidol (HALDOL) 2 MG tablet Take 2 mg by mouth every 4 (four) hours as needed for agitation.    Marland Kitchen HYDROcodone-acetaminophen (NORCO/VICODIN) 5-325 MG tablet Take 1 tablet by mouth every 6 (six) hours as needed for moderate pain. (Lee Pratt not taking: Reported on 03/12/2018) 14 tablet 0  . lisinopril (PRINIVIL,ZESTRIL) 2.5 MG tablet Take 1 tablet (2.5 mg  total) by mouth daily. (Lee Pratt not taking: Reported on 03/12/2018) 30 tablet 6  . metoprolol tartrate 37.5 MG TABS Take 37.5 mg by mouth 2 (two) times daily. (Lee Pratt not taking: Reported on 03/12/2018) 75 tablet 6  . mirtazapine (REMERON) 7.5 MG tablet Take 1 tablet (7.5 mg total) by mouth at bedtime. (Lee Pratt not taking: Reported on 03/12/2018) 30 tablet 0  . prochlorperazine (COMPAZINE) 10 MG tablet Take 1 tablet (10 mg total) by mouth every 6 (six) hours as needed (Nausea or vomiting). (Lee Pratt not taking: Reported on 02/13/2018) 30 tablet 1   No current facility-administered medications for this visit.     PHYSICAL EXAMINATION: ECOG PERFORMANCE STATUS: 2-3 Blood pressure 129/75, pulse 89, respiratory rate 16, temperature 36.6, pulse ox 100% on room air, weight 123 pounds GENERAL:alert, no distress and comfortable SKIN: skin color, texture are normal, no rashes or significant lesions (+) dry mucous membranes  EYES: normal, Conjunctiva are pink and non-injected, sclera clear OROPHARYNX:no exudate, no erythema and lips, buccal mucosa, and tongue normal  NECK: supple, thyroid normal size, non-tender, without nodularity LYMPH:  no palpable cervical, supra/infraclavicular, axillary, or inguinal lymphadenopathy  LUNGS: clear to auscultation bilaterally  with normal breathing effort HEART: regular rate & rhythm and no murmurs and no lower extremity edema ABDOMEN:abdomen soft, non-tender and normal bowel sounds (+) midline abdominal incision healed well, no erythema or discharge (+) RLQ PEG, J-tube removed, but still covered, no drainage or erythema. Looks clean Musculoskeletal:no cyanosis of digits and no clubbing  NEURO: alert & oriented x 3 with fluent speech, no focal motor/sensory deficits PAC without erythema    LABORATORY DATA:  I have reviewed the data as listed CBC Latest Ref Rng & Units 03/12/2018 02/19/2018 02/18/2018  WBC 4.0 - 10.3 K/uL 7.3 6.7 9.2  Hemoglobin 13.0 - 17.0 g/dL - 9.9(L)  9.7(L)  Hematocrit 38.4 - 49.9 % 33.2(L) 29.4(L) 28.9(L)  Platelets 140 - 400 K/uL 349 242 227     CMP Latest Ref Rng & Units 03/12/2018 02/21/2018 02/19/2018  Glucose 70 - 140 mg/dL 105 136(H) 144(H)  BUN 7 - 26 mg/dL 29(H) 27(H) 30(H)  Creatinine 0.70 - 1.30 mg/dL 1.04 0.64 1.00  Sodium 136 - 145 mmol/L 139 139 137  Potassium 3.5 - 5.1 mmol/L 4.0 4.0 3.5  Chloride 98 - 109 mmol/L 103 109 105  CO2 22 - 29 mmol/L '27 23 25  '$ Calcium 8.4 - 10.4 mg/dL 9.6 8.0(L) 7.9(L)  Total Protein 6.4 - 8.3 g/dL 7.7 - 5.5(L)  Total Bilirubin 0.2 - 1.2 mg/dL 0.6 - 0.8  Alkaline Phos 40 - 150 U/L 156(H) - 58  AST 5 - 34 U/L 31 - 44(H)  ALT 0 - 55 U/L 31 - 32   11/12/17 Diagnosis Peritoneum, biopsy - METASTATIC ADENOCARCINOMA  11/10/17 Diagnosis Stomach, biopsy, gastric mass - ADENOCARCINOMA - SEE COMMENT HER2 - NEGATIVE      RADIOGRAPHIC STUDIES: I have personally reviewed the radiological images as listed and agreed with the findings in the report. No results found.   CT CAP W Contrast 02/10/18 IMPRESSION: 1. New patchy pulmonary airspace opacities within the right lower lobe, likely infectious/inflammatory. 2. Interval gastrojejunostomy with resolution of gastric distention. The gastrojejunostomy is patent. Percutaneous jejunal feeding tube retention balloon is displaced peripherally into the anterior abdominal wall and is at risk for becoming dislodged. 3. No evidence of metastatic gastric cancer. Distal gastric wall thickening noted on the previous studies has improved. 4. Extensive atherosclerosis, including Aortic Atherosclerosis (ICD10-I70.0). 5. Underlying chronic lung disease with pleuroparenchymal scarring asymmetric to the right. 6. These results will be called to the ordering clinician or representative by the Radiologist Assistant, and communication documented in the PACS or zVision Dashboard.  11/19/17 CT CHEST IMPRESSION: 1. No evidence of metastatic disease to the  thorax. 2. Sequela of old granulomatous disease, as above. 3. Aortic atherosclerosis, in addition to left main and 3 vessel coronary artery disease. 4. Ectasia of the ascending thoracic aorta (4 cm in diameter). Recommend annual imaging followup by CTA or MRA. This recommendation follows 2010 ACCF/AHA/AATS/ACR/ASA/SCA/SCAI/SIR/STS/SVM Guidelines for the Diagnosis and Management of Patients with Thoracic Aortic Disease. Circulation. 2010; 121: Y248-G500. 5. There are calcifications of the aortic valve. Echocardiographic correlation for evaluation of potential valvular dysfunction may be warranted if clinically indicated. 6. Small volume of pneumoperitoneum noted in the visualized upper abdomen, presumably related to recent gastrojejunostomy performed on 11/12/2017. However, clinical correlation is recommended to exclude any signs are symptoms concerning for perforation.  Aortic Atherosclerosis (ICD10-I70.0).  11/09/17 CT CAP IMPRESSION: 1. Circumferential wall thickening involving the gastric antrum with marked distension of the stomach suggestive of an element of gastric outlet obstruction. Further evaluation with endoscopy is recommended.  2. Short-segment occlusion involving the left lower lobe lateral basal subsegmental bronchus, nonspecific though could be indicative of aspiration in the setting of suspected gastric outlet obstruction. Clinical correlation is advised. 3. Small amount of fluid seen with the pelvic cul-de-sac, presumably reactive. 4. Mild ectasia of the abdominal aorta measuring 2.7 cm in diameter. Recommend follow-up aortic ultrasound in 5 years. This recommendation follows ACR consensus guidelines: White Paper of the ACR Incidental Findings Committee II on Vascular Findings. J Am Coll Radiol 2013; 57:846-962. 5. Aortic Atherosclerosis (ICD10-I70.0). Suspected hemodynamically significant stenoses involving the bilateral external iliac arteries and the right  common femoral artery. Correlation for lower extremity PAD symptoms is recommended. Further evaluation with the acquisition of ABIs could be performed as indicated.   ASSESSMENT & PLAN: 83 y.o. male with PMH of hypertension, but otherwise healthy, a retired Licensed conveyancer, presented with abdominal pain, nausea, anorexia and weight loss.  1.  Gastric adenocarcinoma with peritoneal metastasis, stage IV, HER2(-), PD-L1 (-), MSI-stable, BRAF V600E mutation (+) -We previously reviewed imaging, surgical findings, and pathology results. We reviewed that his cancer is not curable at this stage and the goal of therapy is to prolong his life and improve quality of life. -He received IV Feraheme x2 in the hospital, anemia improved. Will check iron studies every month. -Lee Pratt previously refused to use interpreter service, I was not very sure if he completely understood our discussion, given the complexity of his diagnosis and management options. I spoke with his son Lee Pratt, who is also a physician to help him understand his diagnosis, prognosis and treatment  -I previously discussed his FO genomic testing results. His PDL-1 results show CPS 0, HER2 Negative and MS-Stable. He is not an immunotherapy candidate at this time, and would not benefit from Herceptin   -He has recovered well since hospital discharge on 11/20/17, gained weight back, overall feels well, ready to start chemo. -the goal of therapy is palliative to prolong his life, and reserve his QOL -He started FOLFOX on 12/18/17 and tolerated first dose well overall with dose reduction. He had only one episode of emesis.  -I previously encouraged him to increase his tube feeding by1-2 hours/day and increase in liquid diet by mouth.  -I slightly increased his FOLFOX dose with 2nd cycle and he had bit more side effects, overall manageable  -His restaging CT CAP from 02/10/18 was reviewed with the pt and his wife. It reveals interval gastrojejunostomy  with resolution of gastric distention. No evidence of metastatic gastric cancer; distal gastric wall thickening noted on previous studies is improved. Will continue current regimen. Incidentally there is new patchy pulmonary airspace opacities within the right lower lobe, likely infectious/inflammatory. He has chronic dry cough; although could be related to aspiration. Will monitor.  -Pt experienced emesis and dehydration after Cycle 6 of chemo. He was evaluated at his pump discharge by Lucianne Lei on 02/14/18. He was subsequently admitted to [facility], treated for sepsis, STEMI, and PNA. I saw pt twice while in the hospital. I recommend that he stop chemo for now and we re-evaluate the option in the future if he recovers well from his acute illnesses. Pt was discharged on 02/21/18. -Due to the possible Lee Pratt between 5-FU and his heart attack, and his significant congestive heart failure, I did not recommend more chemo at this point, initially 5-FU based treatment.  He will continue hospice care. -He is currently receiving care at home with Hospice.  Overall has recovered well, still has trouble with tube feeds, only  receiving 25 mL of feeding per hour via G tube, but does eat by mouth, most with soups.  His performance status has improved, able to do basic ADLs and is mobile at home.  I encouraged to continue to eat small amounts of food by mouth as tolerated and to drink up to 1 L of water daily. His metoprolol and linsinopril were discontinued due to low BP. I advised him that he can stop Lipitor as well.  -I discussed his [facility] results with him and his son. He is not a candidate for immunotherapy given the MSI-stable and PD-L1(-). I informed him that he may be a candidate for a clinical trial BASKIT due to the BRAF mutation.  -he will be moving to [place] on 03/21/18 to live with his son. I will send referral to Oncologist, Dr. Beulah Gandy -He appears slightly dehydrated, I encouraged him to drink more water, he will  receive IV fluids today   2.  Gastric outlet syndrome from cancer, status post gastro jejunostomy and PEG placement  -We discussed venting PEG tube PRN if he becomes symptomatic with bloating or abdominal discomfort. Placed urgent dietician referral to establish care and monitor closely.  -For mild nausea and constipation he was prescribed zofran ODT and colace PRN, prescription given to Lee Pratt on 12/5 -I previously reviewed how to use zofran if he experiences nausea. -Pt's J tube site became infected in 01/2018. He was seen in the hospital and started on broad spectrum antibiotics. Surgery removed his J tube and placed him with an ostomy bag. He was discharged with Diflucan as well.   -No longer has ostomy bag  3.  Severe malnutrition, status post J-tube placement for nutrition -He is still severely malnourished. He continues J tube feeding at 50 m//hr for 16 total hours per day, broken up into 8 hour increments; he is to increase rate as tolerated. Home care assists with this.  -He saw Dr. Marcello Moores 12/03/17 who instructed him to advance to liquid diet, we encouraged this. -He has gained some weight back. I encouraged him to continue liquid diet, tube feeding and supplement drink -he had J tube leaking issue, resolved after we advanced a little more  -J tube is no longer loose or leaking as of 01/29/18 -I recommended he'd try eating soup, he says he will get some baby food this week and try that -J tube removed in hospital, currently using G-tube for feeding, 25 ml per hour -I offered megace today, pt declined   4.  Severe iron deficient anemia - improved, status post blood transfusion and IV Feraheme X2 -Iron study from 12/18/17 show Hg at 11.1, Ferritin at 394, iron at 28 and Sat at 10%. -We will continue monitor his iron level -anemia overall much improved after IV iron   5. Hypertension  -he is off meds, after he developed STEMI and hypotension, BP normal today   6. Goal of care  discussion  -We again discussed the incurable nature of his/her cancer, and the overall poor prognosis, especially if he/she does not have good response to chemotherapy or progress on chemo -The Lee Pratt understands the goal of care is palliative. -He agrees with DNR/DNI    Plan: -IV fluids today, 29m/hr for 2 hours  -I will refer him to Dr. HBeulah Gandy he is moving to [place] March 22 -Continue with [facility] until move on 3/22   No orders of the defined types were placed in this encounter.  All questions were answered. The  Lee Pratt knows to call the clinic with any problems, questions or concerns. No barriers to learning was detected.  I spent 20 minutes counseling the Lee Pratt face to face. The total time spent in the appointment was 25 minutes and more than 50% was on counseling, review of test results, and coordination of care.  This document serves as a record of services personally performed by [name], MD. It was created on her behalf by Theresia Bough, a trained medical scribe. The creation of this record is based on the scribe's personal observations and the provider's statements to them.   I have reviewed the above documentation for accuracy and completeness, and I agree with the above.   [name]  03/12/2018

## 2018-03-11 DIAGNOSIS — K311 Adult hypertrophic pyloric stenosis: Secondary | ICD-10-CM | POA: Diagnosis not present

## 2018-03-11 DIAGNOSIS — C786 Secondary malignant neoplasm of retroperitoneum and peritoneum: Secondary | ICD-10-CM | POA: Diagnosis not present

## 2018-03-11 DIAGNOSIS — Z434 Encounter for attention to other artificial openings of digestive tract: Secondary | ICD-10-CM | POA: Diagnosis not present

## 2018-03-11 DIAGNOSIS — Z431 Encounter for attention to gastrostomy: Secondary | ICD-10-CM | POA: Diagnosis not present

## 2018-03-11 DIAGNOSIS — I2510 Atherosclerotic heart disease of native coronary artery without angina pectoris: Secondary | ICD-10-CM | POA: Diagnosis not present

## 2018-03-11 DIAGNOSIS — C169 Malignant neoplasm of stomach, unspecified: Secondary | ICD-10-CM | POA: Diagnosis not present

## 2018-03-12 ENCOUNTER — Other Ambulatory Visit: Payer: Medicare Other

## 2018-03-12 ENCOUNTER — Inpatient Hospital Stay: Attending: Hematology

## 2018-03-12 ENCOUNTER — Encounter: Payer: Self-pay | Admitting: Hematology

## 2018-03-12 ENCOUNTER — Inpatient Hospital Stay (HOSPITAL_BASED_OUTPATIENT_CLINIC_OR_DEPARTMENT_OTHER): Admitting: Hematology

## 2018-03-12 ENCOUNTER — Inpatient Hospital Stay: Admitting: Nutrition

## 2018-03-12 ENCOUNTER — Inpatient Hospital Stay

## 2018-03-12 VITALS — BP 117/65 | HR 100 | Temp 97.6°F | Resp 18 | Ht 69.0 in | Wt 115.8 lb

## 2018-03-12 DIAGNOSIS — C786 Secondary malignant neoplasm of retroperitoneum and peritoneum: Secondary | ICD-10-CM

## 2018-03-12 DIAGNOSIS — D509 Iron deficiency anemia, unspecified: Secondary | ICD-10-CM | POA: Insufficient documentation

## 2018-03-12 DIAGNOSIS — Z931 Gastrostomy status: Secondary | ICD-10-CM | POA: Diagnosis not present

## 2018-03-12 DIAGNOSIS — Z95828 Presence of other vascular implants and grafts: Secondary | ICD-10-CM

## 2018-03-12 DIAGNOSIS — I2510 Atherosclerotic heart disease of native coronary artery without angina pectoris: Secondary | ICD-10-CM | POA: Insufficient documentation

## 2018-03-12 DIAGNOSIS — E440 Moderate protein-calorie malnutrition: Secondary | ICD-10-CM | POA: Diagnosis not present

## 2018-03-12 DIAGNOSIS — Z79899 Other long term (current) drug therapy: Secondary | ICD-10-CM | POA: Diagnosis not present

## 2018-03-12 DIAGNOSIS — I700 Atherosclerosis of aorta: Secondary | ICD-10-CM | POA: Insufficient documentation

## 2018-03-12 DIAGNOSIS — C163 Malignant neoplasm of pyloric antrum: Secondary | ICD-10-CM | POA: Diagnosis not present

## 2018-03-12 DIAGNOSIS — Z66 Do not resuscitate: Secondary | ICD-10-CM | POA: Insufficient documentation

## 2018-03-12 DIAGNOSIS — Z7982 Long term (current) use of aspirin: Secondary | ICD-10-CM | POA: Insufficient documentation

## 2018-03-12 DIAGNOSIS — E43 Unspecified severe protein-calorie malnutrition: Secondary | ICD-10-CM | POA: Diagnosis not present

## 2018-03-12 DIAGNOSIS — I10 Essential (primary) hypertension: Secondary | ICD-10-CM | POA: Diagnosis not present

## 2018-03-12 DIAGNOSIS — C169 Malignant neoplasm of stomach, unspecified: Secondary | ICD-10-CM

## 2018-03-12 LAB — COMPREHENSIVE METABOLIC PANEL
ALBUMIN: 2.9 g/dL — AB (ref 3.5–5.0)
ALK PHOS: 156 U/L — AB (ref 40–150)
ALT: 31 U/L (ref 0–55)
ANION GAP: 9 (ref 3–11)
AST: 31 U/L (ref 5–34)
BUN: 29 mg/dL — ABNORMAL HIGH (ref 7–26)
CO2: 27 mmol/L (ref 22–29)
Calcium: 9.6 mg/dL (ref 8.4–10.4)
Chloride: 103 mmol/L (ref 98–109)
Creatinine, Ser: 1.04 mg/dL (ref 0.70–1.30)
GFR calc Af Amer: >60 mL/min (ref >60)
GFR calc non Af Amer: >60 mL/min (ref >60)
GLUCOSE: 105 mg/dL (ref 70–140)
POTASSIUM: 4 mmol/L (ref 3.5–5.1)
SODIUM: 139 mmol/L (ref 136–145)
Total Bilirubin: 0.6 mg/dL (ref 0.2–1.2)
Total Protein: 7.7 g/dL (ref 6.4–8.3)

## 2018-03-12 LAB — CBC WITH DIFFERENTIAL (CANCER CENTER ONLY)
Basophils Absolute: 0 10*3/uL (ref 0.0–0.1)
Basophils Relative: 0 %
Eosinophils Absolute: 0.1 10*3/uL (ref 0.0–0.5)
Eosinophils Relative: 2 %
HEMATOCRIT: 33.2 % — AB (ref 38.4–49.9)
Hemoglobin: 10.7 g/dL — ABNORMAL LOW (ref 13.0–17.1)
LYMPHS PCT: 32 %
Lymphs Abs: 2.3 10*3/uL (ref 0.9–3.3)
MCH: 30.9 pg (ref 27.2–33.4)
MCHC: 32.2 g/dL (ref 32.0–36.0)
MCV: 96 fL (ref 79.3–98.0)
MONO ABS: 0.4 10*3/uL (ref 0.1–0.9)
MONOS PCT: 6 %
NEUTROS ABS: 4.4 10*3/uL (ref 1.5–6.5)
Neutrophils Relative %: 60 %
Platelet Count: 349 10*3/uL (ref 140–400)
RBC: 3.46 MIL/uL — ABNORMAL LOW (ref 4.20–5.82)
RDW: 16.3 % — AB (ref 11.0–14.6)
WBC Count: 7.3 10*3/uL (ref 4.0–10.3)

## 2018-03-12 LAB — IRON AND TIBC
Iron: 71 ug/dL (ref 42–163)
SATURATION RATIOS: 24 % — AB (ref 42–163)
TIBC: 296 ug/dL (ref 202–409)
UIBC: 225 ug/dL

## 2018-03-12 LAB — FERRITIN: Ferritin: 515 ng/mL — ABNORMAL HIGH (ref 22–316)

## 2018-03-12 LAB — RETICULOCYTES
RBC.: 3.46 MIL/uL — ABNORMAL LOW (ref 4.20–5.82)
RETIC COUNT ABSOLUTE: 76.1 10*3/uL (ref 34.8–93.9)
Retic Ct Pct: 2.2 % — ABNORMAL HIGH (ref 0.8–1.8)

## 2018-03-12 MED ORDER — SODIUM CHLORIDE 0.9 % IV SOLN
500.0000 mL | Freq: Once | INTRAVENOUS | Status: AC
  Administered 2018-03-12: 500 mL via INTRAVENOUS

## 2018-03-12 MED ORDER — HEPARIN SOD (PORK) LOCK FLUSH 100 UNIT/ML IV SOLN
500.0000 [IU] | Freq: Once | INTRAVENOUS | Status: AC | PRN
  Administered 2018-03-12: 500 [IU] via INTRAVENOUS
  Filled 2018-03-12: qty 5

## 2018-03-12 MED ORDER — SODIUM CHLORIDE 0.9% FLUSH
10.0000 mL | INTRAVENOUS | Status: DC | PRN
  Administered 2018-03-12: 10 mL via INTRAVENOUS
  Filled 2018-03-12: qty 10

## 2018-03-12 NOTE — Patient Instructions (Signed)
Dehydration, Adult Dehydration is a condition in which there is not enough fluid or water in the body. This happens when you lose more fluids than you take in. Important organs, such as the kidneys, brain, and heart, cannot function without a proper amount of fluids. Any loss of fluids from the body can lead to dehydration. Dehydration can range from mild to severe. This condition should be treated right away to prevent it from becoming severe. What are the causes? This condition may be caused by:  Vomiting.  Diarrhea.  Excessive sweating, such as from heat exposure or exercise.  Not drinking enough fluid, especially: ? When ill. ? While doing activity that requires a lot of energy.  Excessive urination.  Fever.  Infection.  Certain medicines, such as medicines that cause the body to lose excess fluid (diuretics).  Inability to access safe drinking water.  Reduced physical ability to get adequate water and food.  What increases the risk? This condition is more likely to develop in people:  Who have a poorly controlled long-term (chronic) illness, such as diabetes, heart disease, or kidney disease.  Who are age 65 or older.  Who are disabled.  Who live in a place with high altitude.  Who play endurance sports.  What are the signs or symptoms? Symptoms of mild dehydration may include:  Thirst.  Dry lips.  Slightly dry mouth.  Dry, warm skin.  Dizziness. Symptoms of moderate dehydration may include:  Very dry mouth.  Muscle cramps.  Dark urine. Urine may be the color of tea.  Decreased urine production.  Decreased tear production.  Heartbeat that is irregular or faster than normal (palpitations).  Headache.  Light-headedness, especially when you stand up from a sitting position.  Fainting (syncope). Symptoms of severe dehydration may include:  Changes in skin, such as: ? Cold and clammy skin. ? Blotchy (mottled) or pale skin. ? Skin that does  not quickly return to normal after being lightly pinched and released (poor skin turgor).  Changes in body fluids, such as: ? Extreme thirst. ? No tear production. ? Inability to sweat when body temperature is high, such as in hot weather. ? Very little urine production.  Changes in vital signs, such as: ? Weak pulse. ? Pulse that is more than 100 beats a minute when sitting still. ? Rapid breathing. ? Low blood pressure.  Other changes, such as: ? Sunken eyes. ? Cold hands and feet. ? Confusion. ? Lack of energy (lethargy). ? Difficulty waking up from sleep. ? Short-term weight loss. ? Unconsciousness. How is this diagnosed? This condition is diagnosed based on your symptoms and a physical exam. Blood and urine tests may be done to help confirm the diagnosis. How is this treated? Treatment for this condition depends on the severity. Mild or moderate dehydration can often be treated at home. Treatment should be started right away. Do not wait until dehydration becomes severe. Severe dehydration is an emergency and it needs to be treated in a hospital. Treatment for mild dehydration may include:  Drinking more fluids.  Replacing salts and minerals in your blood (electrolytes) that you may have lost. Treatment for moderate dehydration may include:  Drinking an oral rehydration solution (ORS). This is a drink that helps you replace fluids and electrolytes (rehydrate). It can be found at pharmacies and retail stores. Treatment for severe dehydration may include:  Receiving fluids through an IV tube.  Receiving an electrolyte solution through a feeding tube that is passed through your nose   and into your stomach (nasogastric tube, or NG tube).  Correcting any abnormalities in electrolytes.  Treating the underlying cause of dehydration. Follow these instructions at home:  If directed by your health care provider, drink an ORS: ? Make an ORS by following instructions on the  package. ? Start by drinking small amounts, about  cup (120 mL) every 5-10 minutes. ? Slowly increase how much you drink until you have taken the amount recommended by your health care provider.  Drink enough clear fluid to keep your urine clear or pale yellow. If you were told to drink an ORS, finish the ORS first, then start slowly drinking other clear fluids. Drink fluids such as: ? Water. Do not drink only water. Doing that can lead to having too little salt (sodium) in the body (hyponatremia). ? Ice chips. ? Fruit juice that you have added water to (diluted fruit juice). ? Low-calorie sports drinks.  Avoid: ? Alcohol. ? Drinks that contain a lot of sugar. These include high-calorie sports drinks, fruit juice that is not diluted, and soda. ? Caffeine. ? Foods that are greasy or contain a lot of fat or sugar.  Take over-the-counter and prescription medicines only as told by your health care provider.  Do not take sodium tablets. This can lead to having too much sodium in the body (hypernatremia).  Eat foods that contain a healthy balance of electrolytes, such as bananas, oranges, potatoes, tomatoes, and spinach.  Keep all follow-up visits as told by your health care provider. This is important. Contact a health care provider if:  You have abdominal pain that: ? Gets worse. ? Stays in one area (localizes).  You have a rash.  You have a stiff neck.  You are more irritable than usual.  You are sleepier or more difficult to wake up than usual.  You feel weak or dizzy.  You feel very thirsty.  You have urinated only a small amount of very dark urine over 6-8 hours. Get help right away if:  You have symptoms of severe dehydration.  You cannot drink fluids without vomiting.  Your symptoms get worse with treatment.  You have a fever.  You have a severe headache.  You have vomiting or diarrhea that: ? Gets worse. ? Does not go away.  You have blood or green matter  (bile) in your vomit.  You have blood in your stool. This may cause stool to look black and tarry.  You have not urinated in 6-8 hours.  You faint.  Your heart rate while sitting still is over 100 beats a minute.  You have trouble breathing. This information is not intended to replace advice given to you by your health care provider. Make sure you discuss any questions you have with your health care provider. Document Released: 12/17/2005 Document Revised: 07/13/2016 Document Reviewed: 02/10/2016 Elsevier Interactive Patient Education  2018 [employer]  

## 2018-03-13 DIAGNOSIS — Z431 Encounter for attention to gastrostomy: Secondary | ICD-10-CM | POA: Diagnosis not present

## 2018-03-13 DIAGNOSIS — Z434 Encounter for attention to other artificial openings of digestive tract: Secondary | ICD-10-CM | POA: Diagnosis not present

## 2018-03-13 DIAGNOSIS — I2510 Atherosclerotic heart disease of native coronary artery without angina pectoris: Secondary | ICD-10-CM | POA: Diagnosis not present

## 2018-03-13 DIAGNOSIS — C786 Secondary malignant neoplasm of retroperitoneum and peritoneum: Secondary | ICD-10-CM | POA: Diagnosis not present

## 2018-03-13 DIAGNOSIS — K311 Adult hypertrophic pyloric stenosis: Secondary | ICD-10-CM | POA: Diagnosis not present

## 2018-03-13 DIAGNOSIS — C169 Malignant neoplasm of stomach, unspecified: Secondary | ICD-10-CM | POA: Diagnosis not present

## 2018-03-14 ENCOUNTER — Inpatient Hospital Stay: Payer: Medicare Other

## 2018-03-14 DIAGNOSIS — Z431 Encounter for attention to gastrostomy: Secondary | ICD-10-CM | POA: Diagnosis not present

## 2018-03-14 DIAGNOSIS — Z434 Encounter for attention to other artificial openings of digestive tract: Secondary | ICD-10-CM | POA: Diagnosis not present

## 2018-03-14 DIAGNOSIS — K311 Adult hypertrophic pyloric stenosis: Secondary | ICD-10-CM | POA: Diagnosis not present

## 2018-03-14 DIAGNOSIS — C786 Secondary malignant neoplasm of retroperitoneum and peritoneum: Secondary | ICD-10-CM | POA: Diagnosis not present

## 2018-03-14 DIAGNOSIS — C169 Malignant neoplasm of stomach, unspecified: Secondary | ICD-10-CM | POA: Diagnosis not present

## 2018-03-14 DIAGNOSIS — I2510 Atherosclerotic heart disease of native coronary artery without angina pectoris: Secondary | ICD-10-CM | POA: Diagnosis not present

## 2018-03-17 ENCOUNTER — Telehealth: Payer: Self-pay | Admitting: Hematology

## 2018-03-17 DIAGNOSIS — Z431 Encounter for attention to gastrostomy: Secondary | ICD-10-CM | POA: Diagnosis not present

## 2018-03-17 DIAGNOSIS — I2510 Atherosclerotic heart disease of native coronary artery without angina pectoris: Secondary | ICD-10-CM | POA: Diagnosis not present

## 2018-03-17 DIAGNOSIS — Z434 Encounter for attention to other artificial openings of digestive tract: Secondary | ICD-10-CM | POA: Diagnosis not present

## 2018-03-17 DIAGNOSIS — C169 Malignant neoplasm of stomach, unspecified: Secondary | ICD-10-CM | POA: Diagnosis not present

## 2018-03-17 DIAGNOSIS — K311 Adult hypertrophic pyloric stenosis: Secondary | ICD-10-CM | POA: Diagnosis not present

## 2018-03-17 DIAGNOSIS — C786 Secondary malignant neoplasm of retroperitoneum and peritoneum: Secondary | ICD-10-CM | POA: Diagnosis not present

## 2018-03-17 NOTE — Telephone Encounter (Signed)
Called and made referral appt for pt on 03/26/18 @12 :00pm. Called pt and spoke with son provided him with the appt date and time. Faxed records to [phone].

## 2018-03-17 NOTE — Telephone Encounter (Signed)
Faxed records to [facility] on 03/17/18, Release ID # [id number]

## 2018-03-19 ENCOUNTER — Telehealth: Payer: Self-pay | Admitting: Hematology

## 2018-03-19 DIAGNOSIS — I2510 Atherosclerotic heart disease of native coronary artery without angina pectoris: Secondary | ICD-10-CM | POA: Diagnosis not present

## 2018-03-19 DIAGNOSIS — K311 Adult hypertrophic pyloric stenosis: Secondary | ICD-10-CM | POA: Diagnosis not present

## 2018-03-19 DIAGNOSIS — C786 Secondary malignant neoplasm of retroperitoneum and peritoneum: Secondary | ICD-10-CM | POA: Diagnosis not present

## 2018-03-19 DIAGNOSIS — Z434 Encounter for attention to other artificial openings of digestive tract: Secondary | ICD-10-CM | POA: Diagnosis not present

## 2018-03-19 DIAGNOSIS — C169 Malignant neoplasm of stomach, unspecified: Secondary | ICD-10-CM | POA: Diagnosis not present

## 2018-03-19 DIAGNOSIS — Z431 Encounter for attention to gastrostomy: Secondary | ICD-10-CM | POA: Diagnosis not present

## 2018-03-19 NOTE — Telephone Encounter (Signed)
Called patients son regarding cancelling appointments for 3/21 per 3/20 sch msg and phone msg from patients son also.

## 2018-03-20 ENCOUNTER — Other Ambulatory Visit: Payer: Medicare Other

## 2018-03-20 ENCOUNTER — Ambulatory Visit: Payer: Medicare Other | Admitting: Hematology

## 2018-03-20 DIAGNOSIS — Z434 Encounter for attention to other artificial openings of digestive tract: Secondary | ICD-10-CM | POA: Diagnosis not present

## 2018-03-20 DIAGNOSIS — Z431 Encounter for attention to gastrostomy: Secondary | ICD-10-CM | POA: Diagnosis not present

## 2018-03-20 DIAGNOSIS — C786 Secondary malignant neoplasm of retroperitoneum and peritoneum: Secondary | ICD-10-CM | POA: Diagnosis not present

## 2018-03-20 DIAGNOSIS — I2510 Atherosclerotic heart disease of native coronary artery without angina pectoris: Secondary | ICD-10-CM | POA: Diagnosis not present

## 2018-03-20 DIAGNOSIS — K311 Adult hypertrophic pyloric stenosis: Secondary | ICD-10-CM | POA: Diagnosis not present

## 2018-03-20 DIAGNOSIS — C169 Malignant neoplasm of stomach, unspecified: Secondary | ICD-10-CM | POA: Diagnosis not present

## 2018-03-21 DIAGNOSIS — C786 Secondary malignant neoplasm of retroperitoneum and peritoneum: Secondary | ICD-10-CM | POA: Diagnosis not present

## 2018-03-21 DIAGNOSIS — C169 Malignant neoplasm of stomach, unspecified: Secondary | ICD-10-CM | POA: Diagnosis not present

## 2018-03-21 DIAGNOSIS — I119 Hypertensive heart disease without heart failure: Secondary | ICD-10-CM | POA: Diagnosis not present

## 2018-03-21 DIAGNOSIS — E43 Unspecified severe protein-calorie malnutrition: Secondary | ICD-10-CM | POA: Diagnosis not present

## 2018-03-21 DIAGNOSIS — Z681 Body mass index (BMI) 19 or less, adult: Secondary | ICD-10-CM | POA: Diagnosis not present

## 2018-03-21 DIAGNOSIS — L89152 Pressure ulcer of sacral region, stage 2: Secondary | ICD-10-CM | POA: Diagnosis not present

## 2018-03-21 DIAGNOSIS — I252 Old myocardial infarction: Secondary | ICD-10-CM | POA: Diagnosis not present

## 2018-03-21 DIAGNOSIS — Z515 Encounter for palliative care: Secondary | ICD-10-CM | POA: Diagnosis not present

## 2018-03-21 DIAGNOSIS — Z87891 Personal history of nicotine dependence: Secondary | ICD-10-CM | POA: Diagnosis not present

## 2018-03-22 DIAGNOSIS — I252 Old myocardial infarction: Secondary | ICD-10-CM | POA: Diagnosis not present

## 2018-03-22 DIAGNOSIS — I119 Hypertensive heart disease without heart failure: Secondary | ICD-10-CM | POA: Diagnosis not present

## 2018-03-22 DIAGNOSIS — C786 Secondary malignant neoplasm of retroperitoneum and peritoneum: Secondary | ICD-10-CM | POA: Diagnosis not present

## 2018-03-22 DIAGNOSIS — L89152 Pressure ulcer of sacral region, stage 2: Secondary | ICD-10-CM | POA: Diagnosis not present

## 2018-03-22 DIAGNOSIS — C169 Malignant neoplasm of stomach, unspecified: Secondary | ICD-10-CM | POA: Diagnosis not present

## 2018-03-22 DIAGNOSIS — E43 Unspecified severe protein-calorie malnutrition: Secondary | ICD-10-CM | POA: Diagnosis not present

## 2018-03-24 DIAGNOSIS — C786 Secondary malignant neoplasm of retroperitoneum and peritoneum: Secondary | ICD-10-CM | POA: Diagnosis not present

## 2018-03-24 DIAGNOSIS — C169 Malignant neoplasm of stomach, unspecified: Secondary | ICD-10-CM | POA: Diagnosis not present

## 2018-03-24 DIAGNOSIS — E43 Unspecified severe protein-calorie malnutrition: Secondary | ICD-10-CM | POA: Diagnosis not present

## 2018-03-24 DIAGNOSIS — I252 Old myocardial infarction: Secondary | ICD-10-CM | POA: Diagnosis not present

## 2018-03-24 DIAGNOSIS — L89152 Pressure ulcer of sacral region, stage 2: Secondary | ICD-10-CM | POA: Diagnosis not present

## 2018-03-24 DIAGNOSIS — I119 Hypertensive heart disease without heart failure: Secondary | ICD-10-CM | POA: Diagnosis not present

## 2018-03-26 DIAGNOSIS — L89152 Pressure ulcer of sacral region, stage 2: Secondary | ICD-10-CM | POA: Diagnosis not present

## 2018-03-26 DIAGNOSIS — I252 Old myocardial infarction: Secondary | ICD-10-CM | POA: Diagnosis not present

## 2018-03-26 DIAGNOSIS — E43 Unspecified severe protein-calorie malnutrition: Secondary | ICD-10-CM | POA: Diagnosis not present

## 2018-03-26 DIAGNOSIS — C169 Malignant neoplasm of stomach, unspecified: Secondary | ICD-10-CM | POA: Diagnosis not present

## 2018-03-26 DIAGNOSIS — I119 Hypertensive heart disease without heart failure: Secondary | ICD-10-CM | POA: Diagnosis not present

## 2018-03-26 DIAGNOSIS — C786 Secondary malignant neoplasm of retroperitoneum and peritoneum: Secondary | ICD-10-CM | POA: Diagnosis not present

## 2018-03-27 DIAGNOSIS — L89152 Pressure ulcer of sacral region, stage 2: Secondary | ICD-10-CM | POA: Diagnosis not present

## 2018-03-27 DIAGNOSIS — C786 Secondary malignant neoplasm of retroperitoneum and peritoneum: Secondary | ICD-10-CM | POA: Diagnosis not present

## 2018-03-27 DIAGNOSIS — I252 Old myocardial infarction: Secondary | ICD-10-CM | POA: Diagnosis not present

## 2018-03-27 DIAGNOSIS — I119 Hypertensive heart disease without heart failure: Secondary | ICD-10-CM | POA: Diagnosis not present

## 2018-03-27 DIAGNOSIS — E43 Unspecified severe protein-calorie malnutrition: Secondary | ICD-10-CM | POA: Diagnosis not present

## 2018-03-27 DIAGNOSIS — C169 Malignant neoplasm of stomach, unspecified: Secondary | ICD-10-CM | POA: Diagnosis not present

## 2018-03-28 DIAGNOSIS — I119 Hypertensive heart disease without heart failure: Secondary | ICD-10-CM | POA: Diagnosis not present

## 2018-03-28 DIAGNOSIS — L89152 Pressure ulcer of sacral region, stage 2: Secondary | ICD-10-CM | POA: Diagnosis not present

## 2018-03-28 DIAGNOSIS — C786 Secondary malignant neoplasm of retroperitoneum and peritoneum: Secondary | ICD-10-CM | POA: Diagnosis not present

## 2018-03-28 DIAGNOSIS — I252 Old myocardial infarction: Secondary | ICD-10-CM | POA: Diagnosis not present

## 2018-03-28 DIAGNOSIS — C169 Malignant neoplasm of stomach, unspecified: Secondary | ICD-10-CM | POA: Diagnosis not present

## 2018-03-28 DIAGNOSIS — E43 Unspecified severe protein-calorie malnutrition: Secondary | ICD-10-CM | POA: Diagnosis not present

## 2018-03-31 DIAGNOSIS — I252 Old myocardial infarction: Secondary | ICD-10-CM | POA: Diagnosis not present

## 2018-03-31 DIAGNOSIS — E43 Unspecified severe protein-calorie malnutrition: Secondary | ICD-10-CM | POA: Diagnosis not present

## 2018-03-31 DIAGNOSIS — Z515 Encounter for palliative care: Secondary | ICD-10-CM | POA: Diagnosis not present

## 2018-03-31 DIAGNOSIS — C169 Malignant neoplasm of stomach, unspecified: Secondary | ICD-10-CM | POA: Diagnosis not present

## 2018-03-31 DIAGNOSIS — Z681 Body mass index (BMI) 19 or less, adult: Secondary | ICD-10-CM | POA: Diagnosis not present

## 2018-03-31 DIAGNOSIS — I119 Hypertensive heart disease without heart failure: Secondary | ICD-10-CM | POA: Diagnosis not present

## 2018-03-31 DIAGNOSIS — L89152 Pressure ulcer of sacral region, stage 2: Secondary | ICD-10-CM | POA: Diagnosis not present

## 2018-03-31 DIAGNOSIS — C786 Secondary malignant neoplasm of retroperitoneum and peritoneum: Secondary | ICD-10-CM | POA: Diagnosis not present

## 2018-03-31 DIAGNOSIS — Z87891 Personal history of nicotine dependence: Secondary | ICD-10-CM | POA: Diagnosis not present

## 2018-04-03 DIAGNOSIS — E43 Unspecified severe protein-calorie malnutrition: Secondary | ICD-10-CM | POA: Diagnosis not present

## 2018-04-03 DIAGNOSIS — C786 Secondary malignant neoplasm of retroperitoneum and peritoneum: Secondary | ICD-10-CM | POA: Diagnosis not present

## 2018-04-03 DIAGNOSIS — I252 Old myocardial infarction: Secondary | ICD-10-CM | POA: Diagnosis not present

## 2018-04-03 DIAGNOSIS — I119 Hypertensive heart disease without heart failure: Secondary | ICD-10-CM | POA: Diagnosis not present

## 2018-04-03 DIAGNOSIS — C169 Malignant neoplasm of stomach, unspecified: Secondary | ICD-10-CM | POA: Diagnosis not present

## 2018-04-03 DIAGNOSIS — L89152 Pressure ulcer of sacral region, stage 2: Secondary | ICD-10-CM | POA: Diagnosis not present

## 2018-04-07 DIAGNOSIS — I252 Old myocardial infarction: Secondary | ICD-10-CM | POA: Diagnosis not present

## 2018-04-07 DIAGNOSIS — E43 Unspecified severe protein-calorie malnutrition: Secondary | ICD-10-CM | POA: Diagnosis not present

## 2018-04-07 DIAGNOSIS — I119 Hypertensive heart disease without heart failure: Secondary | ICD-10-CM | POA: Diagnosis not present

## 2018-04-07 DIAGNOSIS — L89152 Pressure ulcer of sacral region, stage 2: Secondary | ICD-10-CM | POA: Diagnosis not present

## 2018-04-07 DIAGNOSIS — C169 Malignant neoplasm of stomach, unspecified: Secondary | ICD-10-CM | POA: Diagnosis not present

## 2018-04-07 DIAGNOSIS — C786 Secondary malignant neoplasm of retroperitoneum and peritoneum: Secondary | ICD-10-CM | POA: Diagnosis not present

## 2018-04-10 DIAGNOSIS — E43 Unspecified severe protein-calorie malnutrition: Secondary | ICD-10-CM | POA: Diagnosis not present

## 2018-04-10 DIAGNOSIS — I119 Hypertensive heart disease without heart failure: Secondary | ICD-10-CM | POA: Diagnosis not present

## 2018-04-10 DIAGNOSIS — C169 Malignant neoplasm of stomach, unspecified: Secondary | ICD-10-CM | POA: Diagnosis not present

## 2018-04-10 DIAGNOSIS — L89152 Pressure ulcer of sacral region, stage 2: Secondary | ICD-10-CM | POA: Diagnosis not present

## 2018-04-10 DIAGNOSIS — C786 Secondary malignant neoplasm of retroperitoneum and peritoneum: Secondary | ICD-10-CM | POA: Diagnosis not present

## 2018-04-10 DIAGNOSIS — I252 Old myocardial infarction: Secondary | ICD-10-CM | POA: Diagnosis not present

## 2018-04-14 DIAGNOSIS — E43 Unspecified severe protein-calorie malnutrition: Secondary | ICD-10-CM | POA: Diagnosis not present

## 2018-04-14 DIAGNOSIS — C169 Malignant neoplasm of stomach, unspecified: Secondary | ICD-10-CM | POA: Diagnosis not present

## 2018-04-14 DIAGNOSIS — I252 Old myocardial infarction: Secondary | ICD-10-CM | POA: Diagnosis not present

## 2018-04-14 DIAGNOSIS — I119 Hypertensive heart disease without heart failure: Secondary | ICD-10-CM | POA: Diagnosis not present

## 2018-04-14 DIAGNOSIS — C786 Secondary malignant neoplasm of retroperitoneum and peritoneum: Secondary | ICD-10-CM | POA: Diagnosis not present

## 2018-04-14 DIAGNOSIS — L89152 Pressure ulcer of sacral region, stage 2: Secondary | ICD-10-CM | POA: Diagnosis not present

## 2018-04-17 DIAGNOSIS — C786 Secondary malignant neoplasm of retroperitoneum and peritoneum: Secondary | ICD-10-CM | POA: Diagnosis not present

## 2018-04-17 DIAGNOSIS — I119 Hypertensive heart disease without heart failure: Secondary | ICD-10-CM | POA: Diagnosis not present

## 2018-04-17 DIAGNOSIS — I252 Old myocardial infarction: Secondary | ICD-10-CM | POA: Diagnosis not present

## 2018-04-17 DIAGNOSIS — L89152 Pressure ulcer of sacral region, stage 2: Secondary | ICD-10-CM | POA: Diagnosis not present

## 2018-04-17 DIAGNOSIS — E43 Unspecified severe protein-calorie malnutrition: Secondary | ICD-10-CM | POA: Diagnosis not present

## 2018-04-17 DIAGNOSIS — C169 Malignant neoplasm of stomach, unspecified: Secondary | ICD-10-CM | POA: Diagnosis not present

## 2018-04-21 DIAGNOSIS — L89152 Pressure ulcer of sacral region, stage 2: Secondary | ICD-10-CM | POA: Diagnosis not present

## 2018-04-21 DIAGNOSIS — C786 Secondary malignant neoplasm of retroperitoneum and peritoneum: Secondary | ICD-10-CM | POA: Diagnosis not present

## 2018-04-21 DIAGNOSIS — I252 Old myocardial infarction: Secondary | ICD-10-CM | POA: Diagnosis not present

## 2018-04-21 DIAGNOSIS — C169 Malignant neoplasm of stomach, unspecified: Secondary | ICD-10-CM | POA: Diagnosis not present

## 2018-04-21 DIAGNOSIS — I119 Hypertensive heart disease without heart failure: Secondary | ICD-10-CM | POA: Diagnosis not present

## 2018-04-21 DIAGNOSIS — E43 Unspecified severe protein-calorie malnutrition: Secondary | ICD-10-CM | POA: Diagnosis not present

## 2018-04-24 DIAGNOSIS — I252 Old myocardial infarction: Secondary | ICD-10-CM | POA: Diagnosis not present

## 2018-04-24 DIAGNOSIS — C786 Secondary malignant neoplasm of retroperitoneum and peritoneum: Secondary | ICD-10-CM | POA: Diagnosis not present

## 2018-04-24 DIAGNOSIS — E43 Unspecified severe protein-calorie malnutrition: Secondary | ICD-10-CM | POA: Diagnosis not present

## 2018-04-24 DIAGNOSIS — L89152 Pressure ulcer of sacral region, stage 2: Secondary | ICD-10-CM | POA: Diagnosis not present

## 2018-04-24 DIAGNOSIS — C169 Malignant neoplasm of stomach, unspecified: Secondary | ICD-10-CM | POA: Diagnosis not present

## 2018-04-24 DIAGNOSIS — I119 Hypertensive heart disease without heart failure: Secondary | ICD-10-CM | POA: Diagnosis not present

## 2018-04-25 DIAGNOSIS — E43 Unspecified severe protein-calorie malnutrition: Secondary | ICD-10-CM | POA: Diagnosis not present

## 2018-04-25 DIAGNOSIS — L89152 Pressure ulcer of sacral region, stage 2: Secondary | ICD-10-CM | POA: Diagnosis not present

## 2018-04-25 DIAGNOSIS — C169 Malignant neoplasm of stomach, unspecified: Secondary | ICD-10-CM | POA: Diagnosis not present

## 2018-04-25 DIAGNOSIS — I252 Old myocardial infarction: Secondary | ICD-10-CM | POA: Diagnosis not present

## 2018-04-25 DIAGNOSIS — C786 Secondary malignant neoplasm of retroperitoneum and peritoneum: Secondary | ICD-10-CM | POA: Diagnosis not present

## 2018-04-25 DIAGNOSIS — I119 Hypertensive heart disease without heart failure: Secondary | ICD-10-CM | POA: Diagnosis not present

## 2018-04-28 DIAGNOSIS — L89152 Pressure ulcer of sacral region, stage 2: Secondary | ICD-10-CM | POA: Diagnosis not present

## 2018-04-28 DIAGNOSIS — C169 Malignant neoplasm of stomach, unspecified: Secondary | ICD-10-CM | POA: Diagnosis not present

## 2018-04-28 DIAGNOSIS — E43 Unspecified severe protein-calorie malnutrition: Secondary | ICD-10-CM | POA: Diagnosis not present

## 2018-04-28 DIAGNOSIS — I119 Hypertensive heart disease without heart failure: Secondary | ICD-10-CM | POA: Diagnosis not present

## 2018-04-28 DIAGNOSIS — I252 Old myocardial infarction: Secondary | ICD-10-CM | POA: Diagnosis not present

## 2018-04-28 DIAGNOSIS — C786 Secondary malignant neoplasm of retroperitoneum and peritoneum: Secondary | ICD-10-CM | POA: Diagnosis not present

## 2018-04-30 DIAGNOSIS — C169 Malignant neoplasm of stomach, unspecified: Secondary | ICD-10-CM | POA: Diagnosis not present

## 2018-04-30 DIAGNOSIS — C786 Secondary malignant neoplasm of retroperitoneum and peritoneum: Secondary | ICD-10-CM | POA: Diagnosis not present

## 2018-04-30 DIAGNOSIS — Z87891 Personal history of nicotine dependence: Secondary | ICD-10-CM | POA: Diagnosis not present

## 2018-04-30 DIAGNOSIS — Z515 Encounter for palliative care: Secondary | ICD-10-CM | POA: Diagnosis not present

## 2018-04-30 DIAGNOSIS — E43 Unspecified severe protein-calorie malnutrition: Secondary | ICD-10-CM | POA: Diagnosis not present

## 2018-04-30 DIAGNOSIS — Z681 Body mass index (BMI) 19 or less, adult: Secondary | ICD-10-CM | POA: Diagnosis not present

## 2018-04-30 DIAGNOSIS — I252 Old myocardial infarction: Secondary | ICD-10-CM | POA: Diagnosis not present

## 2018-04-30 DIAGNOSIS — L89152 Pressure ulcer of sacral region, stage 2: Secondary | ICD-10-CM | POA: Diagnosis not present

## 2018-04-30 DIAGNOSIS — I119 Hypertensive heart disease without heart failure: Secondary | ICD-10-CM | POA: Diagnosis not present

## 2018-05-01 DIAGNOSIS — I252 Old myocardial infarction: Secondary | ICD-10-CM | POA: Diagnosis not present

## 2018-05-01 DIAGNOSIS — C786 Secondary malignant neoplasm of retroperitoneum and peritoneum: Secondary | ICD-10-CM | POA: Diagnosis not present

## 2018-05-01 DIAGNOSIS — E43 Unspecified severe protein-calorie malnutrition: Secondary | ICD-10-CM | POA: Diagnosis not present

## 2018-05-01 DIAGNOSIS — I119 Hypertensive heart disease without heart failure: Secondary | ICD-10-CM | POA: Diagnosis not present

## 2018-05-01 DIAGNOSIS — L89152 Pressure ulcer of sacral region, stage 2: Secondary | ICD-10-CM | POA: Diagnosis not present

## 2018-05-01 DIAGNOSIS — C169 Malignant neoplasm of stomach, unspecified: Secondary | ICD-10-CM | POA: Diagnosis not present

## 2018-05-05 DIAGNOSIS — I252 Old myocardial infarction: Secondary | ICD-10-CM | POA: Diagnosis not present

## 2018-05-05 DIAGNOSIS — C169 Malignant neoplasm of stomach, unspecified: Secondary | ICD-10-CM | POA: Diagnosis not present

## 2018-05-05 DIAGNOSIS — I119 Hypertensive heart disease without heart failure: Secondary | ICD-10-CM | POA: Diagnosis not present

## 2018-05-05 DIAGNOSIS — C786 Secondary malignant neoplasm of retroperitoneum and peritoneum: Secondary | ICD-10-CM | POA: Diagnosis not present

## 2018-05-05 DIAGNOSIS — E43 Unspecified severe protein-calorie malnutrition: Secondary | ICD-10-CM | POA: Diagnosis not present

## 2018-05-05 DIAGNOSIS — L89152 Pressure ulcer of sacral region, stage 2: Secondary | ICD-10-CM | POA: Diagnosis not present

## 2018-05-12 DIAGNOSIS — I252 Old myocardial infarction: Secondary | ICD-10-CM | POA: Diagnosis not present

## 2018-05-12 DIAGNOSIS — E43 Unspecified severe protein-calorie malnutrition: Secondary | ICD-10-CM | POA: Diagnosis not present

## 2018-05-12 DIAGNOSIS — C786 Secondary malignant neoplasm of retroperitoneum and peritoneum: Secondary | ICD-10-CM | POA: Diagnosis not present

## 2018-05-12 DIAGNOSIS — L89152 Pressure ulcer of sacral region, stage 2: Secondary | ICD-10-CM | POA: Diagnosis not present

## 2018-05-12 DIAGNOSIS — I119 Hypertensive heart disease without heart failure: Secondary | ICD-10-CM | POA: Diagnosis not present

## 2018-05-12 DIAGNOSIS — C169 Malignant neoplasm of stomach, unspecified: Secondary | ICD-10-CM | POA: Diagnosis not present

## 2018-05-17 DIAGNOSIS — E43 Unspecified severe protein-calorie malnutrition: Secondary | ICD-10-CM | POA: Diagnosis not present

## 2018-05-17 DIAGNOSIS — I119 Hypertensive heart disease without heart failure: Secondary | ICD-10-CM | POA: Diagnosis not present

## 2018-05-17 DIAGNOSIS — I252 Old myocardial infarction: Secondary | ICD-10-CM | POA: Diagnosis not present

## 2018-05-17 DIAGNOSIS — L89152 Pressure ulcer of sacral region, stage 2: Secondary | ICD-10-CM | POA: Diagnosis not present

## 2018-05-17 DIAGNOSIS — C786 Secondary malignant neoplasm of retroperitoneum and peritoneum: Secondary | ICD-10-CM | POA: Diagnosis not present

## 2018-05-17 DIAGNOSIS — C169 Malignant neoplasm of stomach, unspecified: Secondary | ICD-10-CM | POA: Diagnosis not present

## 2018-05-27 DIAGNOSIS — C169 Malignant neoplasm of stomach, unspecified: Secondary | ICD-10-CM | POA: Diagnosis not present

## 2018-05-27 DIAGNOSIS — C786 Secondary malignant neoplasm of retroperitoneum and peritoneum: Secondary | ICD-10-CM | POA: Diagnosis not present

## 2018-05-27 DIAGNOSIS — E43 Unspecified severe protein-calorie malnutrition: Secondary | ICD-10-CM | POA: Diagnosis not present

## 2018-05-27 DIAGNOSIS — I252 Old myocardial infarction: Secondary | ICD-10-CM | POA: Diagnosis not present

## 2018-05-27 DIAGNOSIS — L89152 Pressure ulcer of sacral region, stage 2: Secondary | ICD-10-CM | POA: Diagnosis not present

## 2018-05-27 DIAGNOSIS — I119 Hypertensive heart disease without heart failure: Secondary | ICD-10-CM | POA: Diagnosis not present

## 2018-05-31 DIAGNOSIS — C786 Secondary malignant neoplasm of retroperitoneum and peritoneum: Secondary | ICD-10-CM | POA: Diagnosis not present

## 2018-05-31 DIAGNOSIS — C169 Malignant neoplasm of stomach, unspecified: Secondary | ICD-10-CM | POA: Diagnosis not present

## 2018-05-31 DIAGNOSIS — I119 Hypertensive heart disease without heart failure: Secondary | ICD-10-CM | POA: Diagnosis not present

## 2018-05-31 DIAGNOSIS — E43 Unspecified severe protein-calorie malnutrition: Secondary | ICD-10-CM | POA: Diagnosis not present

## 2018-05-31 DIAGNOSIS — Z681 Body mass index (BMI) 19 or less, adult: Secondary | ICD-10-CM | POA: Diagnosis not present

## 2018-05-31 DIAGNOSIS — Z515 Encounter for palliative care: Secondary | ICD-10-CM | POA: Diagnosis not present

## 2018-05-31 DIAGNOSIS — L89152 Pressure ulcer of sacral region, stage 2: Secondary | ICD-10-CM | POA: Diagnosis not present

## 2018-05-31 DIAGNOSIS — I252 Old myocardial infarction: Secondary | ICD-10-CM | POA: Diagnosis not present

## 2018-05-31 DIAGNOSIS — Z87891 Personal history of nicotine dependence: Secondary | ICD-10-CM | POA: Diagnosis not present

## 2018-06-02 DIAGNOSIS — E43 Unspecified severe protein-calorie malnutrition: Secondary | ICD-10-CM | POA: Diagnosis not present

## 2018-06-02 DIAGNOSIS — I252 Old myocardial infarction: Secondary | ICD-10-CM | POA: Diagnosis not present

## 2018-06-02 DIAGNOSIS — C786 Secondary malignant neoplasm of retroperitoneum and peritoneum: Secondary | ICD-10-CM | POA: Diagnosis not present

## 2018-06-02 DIAGNOSIS — L89152 Pressure ulcer of sacral region, stage 2: Secondary | ICD-10-CM | POA: Diagnosis not present

## 2018-06-02 DIAGNOSIS — C169 Malignant neoplasm of stomach, unspecified: Secondary | ICD-10-CM | POA: Diagnosis not present

## 2018-06-02 DIAGNOSIS — I119 Hypertensive heart disease without heart failure: Secondary | ICD-10-CM | POA: Diagnosis not present

## 2018-06-04 DIAGNOSIS — I119 Hypertensive heart disease without heart failure: Secondary | ICD-10-CM | POA: Diagnosis not present

## 2018-06-04 DIAGNOSIS — E43 Unspecified severe protein-calorie malnutrition: Secondary | ICD-10-CM | POA: Diagnosis not present

## 2018-06-04 DIAGNOSIS — I252 Old myocardial infarction: Secondary | ICD-10-CM | POA: Diagnosis not present

## 2018-06-04 DIAGNOSIS — L89152 Pressure ulcer of sacral region, stage 2: Secondary | ICD-10-CM | POA: Diagnosis not present

## 2018-06-04 DIAGNOSIS — C786 Secondary malignant neoplasm of retroperitoneum and peritoneum: Secondary | ICD-10-CM | POA: Diagnosis not present

## 2018-06-04 DIAGNOSIS — C169 Malignant neoplasm of stomach, unspecified: Secondary | ICD-10-CM | POA: Diagnosis not present

## 2018-06-05 DIAGNOSIS — I252 Old myocardial infarction: Secondary | ICD-10-CM | POA: Diagnosis not present

## 2018-06-05 DIAGNOSIS — C786 Secondary malignant neoplasm of retroperitoneum and peritoneum: Secondary | ICD-10-CM | POA: Diagnosis not present

## 2018-06-05 DIAGNOSIS — E43 Unspecified severe protein-calorie malnutrition: Secondary | ICD-10-CM | POA: Diagnosis not present

## 2018-06-05 DIAGNOSIS — L89152 Pressure ulcer of sacral region, stage 2: Secondary | ICD-10-CM | POA: Diagnosis not present

## 2018-06-05 DIAGNOSIS — C169 Malignant neoplasm of stomach, unspecified: Secondary | ICD-10-CM | POA: Diagnosis not present

## 2018-06-05 DIAGNOSIS — I119 Hypertensive heart disease without heart failure: Secondary | ICD-10-CM | POA: Diagnosis not present

## 2018-06-06 DIAGNOSIS — L89152 Pressure ulcer of sacral region, stage 2: Secondary | ICD-10-CM | POA: Diagnosis not present

## 2018-06-06 DIAGNOSIS — I119 Hypertensive heart disease without heart failure: Secondary | ICD-10-CM | POA: Diagnosis not present

## 2018-06-06 DIAGNOSIS — I252 Old myocardial infarction: Secondary | ICD-10-CM | POA: Diagnosis not present

## 2018-06-06 DIAGNOSIS — C786 Secondary malignant neoplasm of retroperitoneum and peritoneum: Secondary | ICD-10-CM | POA: Diagnosis not present

## 2018-06-06 DIAGNOSIS — C169 Malignant neoplasm of stomach, unspecified: Secondary | ICD-10-CM | POA: Diagnosis not present

## 2018-06-06 DIAGNOSIS — E43 Unspecified severe protein-calorie malnutrition: Secondary | ICD-10-CM | POA: Diagnosis not present

## 2018-06-09 DIAGNOSIS — L89152 Pressure ulcer of sacral region, stage 2: Secondary | ICD-10-CM | POA: Diagnosis not present

## 2018-06-09 DIAGNOSIS — I119 Hypertensive heart disease without heart failure: Secondary | ICD-10-CM | POA: Diagnosis not present

## 2018-06-09 DIAGNOSIS — I252 Old myocardial infarction: Secondary | ICD-10-CM | POA: Diagnosis not present

## 2018-06-09 DIAGNOSIS — C786 Secondary malignant neoplasm of retroperitoneum and peritoneum: Secondary | ICD-10-CM | POA: Diagnosis not present

## 2018-06-09 DIAGNOSIS — E43 Unspecified severe protein-calorie malnutrition: Secondary | ICD-10-CM | POA: Diagnosis not present

## 2018-06-09 DIAGNOSIS — C169 Malignant neoplasm of stomach, unspecified: Secondary | ICD-10-CM | POA: Diagnosis not present

## 2018-06-11 DIAGNOSIS — C786 Secondary malignant neoplasm of retroperitoneum and peritoneum: Secondary | ICD-10-CM | POA: Diagnosis not present

## 2018-06-11 DIAGNOSIS — C169 Malignant neoplasm of stomach, unspecified: Secondary | ICD-10-CM | POA: Diagnosis not present

## 2018-06-11 DIAGNOSIS — I119 Hypertensive heart disease without heart failure: Secondary | ICD-10-CM | POA: Diagnosis not present

## 2018-06-11 DIAGNOSIS — I252 Old myocardial infarction: Secondary | ICD-10-CM | POA: Diagnosis not present

## 2018-06-11 DIAGNOSIS — E43 Unspecified severe protein-calorie malnutrition: Secondary | ICD-10-CM | POA: Diagnosis not present

## 2018-06-11 DIAGNOSIS — L89152 Pressure ulcer of sacral region, stage 2: Secondary | ICD-10-CM | POA: Diagnosis not present

## 2018-06-12 DIAGNOSIS — I119 Hypertensive heart disease without heart failure: Secondary | ICD-10-CM | POA: Diagnosis not present

## 2018-06-12 DIAGNOSIS — I252 Old myocardial infarction: Secondary | ICD-10-CM | POA: Diagnosis not present

## 2018-06-12 DIAGNOSIS — C786 Secondary malignant neoplasm of retroperitoneum and peritoneum: Secondary | ICD-10-CM | POA: Diagnosis not present

## 2018-06-12 DIAGNOSIS — E43 Unspecified severe protein-calorie malnutrition: Secondary | ICD-10-CM | POA: Diagnosis not present

## 2018-06-12 DIAGNOSIS — L89152 Pressure ulcer of sacral region, stage 2: Secondary | ICD-10-CM | POA: Diagnosis not present

## 2018-06-12 DIAGNOSIS — C169 Malignant neoplasm of stomach, unspecified: Secondary | ICD-10-CM | POA: Diagnosis not present

## 2018-06-13 DIAGNOSIS — I252 Old myocardial infarction: Secondary | ICD-10-CM | POA: Diagnosis not present

## 2018-06-13 DIAGNOSIS — C169 Malignant neoplasm of stomach, unspecified: Secondary | ICD-10-CM | POA: Diagnosis not present

## 2018-06-13 DIAGNOSIS — C786 Secondary malignant neoplasm of retroperitoneum and peritoneum: Secondary | ICD-10-CM | POA: Diagnosis not present

## 2018-06-13 DIAGNOSIS — E43 Unspecified severe protein-calorie malnutrition: Secondary | ICD-10-CM | POA: Diagnosis not present

## 2018-06-13 DIAGNOSIS — I119 Hypertensive heart disease without heart failure: Secondary | ICD-10-CM | POA: Diagnosis not present

## 2018-06-13 DIAGNOSIS — L89152 Pressure ulcer of sacral region, stage 2: Secondary | ICD-10-CM | POA: Diagnosis not present

## 2018-06-16 DIAGNOSIS — E43 Unspecified severe protein-calorie malnutrition: Secondary | ICD-10-CM | POA: Diagnosis not present

## 2018-06-16 DIAGNOSIS — I252 Old myocardial infarction: Secondary | ICD-10-CM | POA: Diagnosis not present

## 2018-06-16 DIAGNOSIS — I119 Hypertensive heart disease without heart failure: Secondary | ICD-10-CM | POA: Diagnosis not present

## 2018-06-16 DIAGNOSIS — C169 Malignant neoplasm of stomach, unspecified: Secondary | ICD-10-CM | POA: Diagnosis not present

## 2018-06-16 DIAGNOSIS — L89152 Pressure ulcer of sacral region, stage 2: Secondary | ICD-10-CM | POA: Diagnosis not present

## 2018-06-16 DIAGNOSIS — C786 Secondary malignant neoplasm of retroperitoneum and peritoneum: Secondary | ICD-10-CM | POA: Diagnosis not present

## 2018-06-18 DIAGNOSIS — I119 Hypertensive heart disease without heart failure: Secondary | ICD-10-CM | POA: Diagnosis not present

## 2018-06-18 DIAGNOSIS — C169 Malignant neoplasm of stomach, unspecified: Secondary | ICD-10-CM | POA: Diagnosis not present

## 2018-06-18 DIAGNOSIS — I252 Old myocardial infarction: Secondary | ICD-10-CM | POA: Diagnosis not present

## 2018-06-18 DIAGNOSIS — C786 Secondary malignant neoplasm of retroperitoneum and peritoneum: Secondary | ICD-10-CM | POA: Diagnosis not present

## 2018-06-18 DIAGNOSIS — L89152 Pressure ulcer of sacral region, stage 2: Secondary | ICD-10-CM | POA: Diagnosis not present

## 2018-06-18 DIAGNOSIS — E43 Unspecified severe protein-calorie malnutrition: Secondary | ICD-10-CM | POA: Diagnosis not present

## 2018-06-19 DIAGNOSIS — I119 Hypertensive heart disease without heart failure: Secondary | ICD-10-CM | POA: Diagnosis not present

## 2018-06-19 DIAGNOSIS — I252 Old myocardial infarction: Secondary | ICD-10-CM | POA: Diagnosis not present

## 2018-06-19 DIAGNOSIS — L89152 Pressure ulcer of sacral region, stage 2: Secondary | ICD-10-CM | POA: Diagnosis not present

## 2018-06-19 DIAGNOSIS — E43 Unspecified severe protein-calorie malnutrition: Secondary | ICD-10-CM | POA: Diagnosis not present

## 2018-06-19 DIAGNOSIS — C786 Secondary malignant neoplasm of retroperitoneum and peritoneum: Secondary | ICD-10-CM | POA: Diagnosis not present

## 2018-06-19 DIAGNOSIS — C169 Malignant neoplasm of stomach, unspecified: Secondary | ICD-10-CM | POA: Diagnosis not present

## 2018-06-23 DIAGNOSIS — I119 Hypertensive heart disease without heart failure: Secondary | ICD-10-CM | POA: Diagnosis not present

## 2018-06-23 DIAGNOSIS — C169 Malignant neoplasm of stomach, unspecified: Secondary | ICD-10-CM | POA: Diagnosis not present

## 2018-06-23 DIAGNOSIS — E43 Unspecified severe protein-calorie malnutrition: Secondary | ICD-10-CM | POA: Diagnosis not present

## 2018-06-23 DIAGNOSIS — L89152 Pressure ulcer of sacral region, stage 2: Secondary | ICD-10-CM | POA: Diagnosis not present

## 2018-06-23 DIAGNOSIS — C786 Secondary malignant neoplasm of retroperitoneum and peritoneum: Secondary | ICD-10-CM | POA: Diagnosis not present

## 2018-06-23 DIAGNOSIS — I252 Old myocardial infarction: Secondary | ICD-10-CM | POA: Diagnosis not present

## 2018-06-24 DIAGNOSIS — L89152 Pressure ulcer of sacral region, stage 2: Secondary | ICD-10-CM | POA: Diagnosis not present

## 2018-06-24 DIAGNOSIS — C786 Secondary malignant neoplasm of retroperitoneum and peritoneum: Secondary | ICD-10-CM | POA: Diagnosis not present

## 2018-06-24 DIAGNOSIS — E43 Unspecified severe protein-calorie malnutrition: Secondary | ICD-10-CM | POA: Diagnosis not present

## 2018-06-24 DIAGNOSIS — C169 Malignant neoplasm of stomach, unspecified: Secondary | ICD-10-CM | POA: Diagnosis not present

## 2018-06-24 DIAGNOSIS — I252 Old myocardial infarction: Secondary | ICD-10-CM | POA: Diagnosis not present

## 2018-06-24 DIAGNOSIS — I119 Hypertensive heart disease without heart failure: Secondary | ICD-10-CM | POA: Diagnosis not present

## 2018-06-26 DIAGNOSIS — E43 Unspecified severe protein-calorie malnutrition: Secondary | ICD-10-CM | POA: Diagnosis not present

## 2018-06-26 DIAGNOSIS — I119 Hypertensive heart disease without heart failure: Secondary | ICD-10-CM | POA: Diagnosis not present

## 2018-06-26 DIAGNOSIS — L89152 Pressure ulcer of sacral region, stage 2: Secondary | ICD-10-CM | POA: Diagnosis not present

## 2018-06-26 DIAGNOSIS — I252 Old myocardial infarction: Secondary | ICD-10-CM | POA: Diagnosis not present

## 2018-06-26 DIAGNOSIS — C786 Secondary malignant neoplasm of retroperitoneum and peritoneum: Secondary | ICD-10-CM | POA: Diagnosis not present

## 2018-06-26 DIAGNOSIS — C169 Malignant neoplasm of stomach, unspecified: Secondary | ICD-10-CM | POA: Diagnosis not present

## 2018-06-27 DIAGNOSIS — I119 Hypertensive heart disease without heart failure: Secondary | ICD-10-CM | POA: Diagnosis not present

## 2018-06-27 DIAGNOSIS — L89152 Pressure ulcer of sacral region, stage 2: Secondary | ICD-10-CM | POA: Diagnosis not present

## 2018-06-27 DIAGNOSIS — C786 Secondary malignant neoplasm of retroperitoneum and peritoneum: Secondary | ICD-10-CM | POA: Diagnosis not present

## 2018-06-27 DIAGNOSIS — I252 Old myocardial infarction: Secondary | ICD-10-CM | POA: Diagnosis not present

## 2018-06-27 DIAGNOSIS — E43 Unspecified severe protein-calorie malnutrition: Secondary | ICD-10-CM | POA: Diagnosis not present

## 2018-06-27 DIAGNOSIS — C169 Malignant neoplasm of stomach, unspecified: Secondary | ICD-10-CM | POA: Diagnosis not present

## 2018-06-30 DIAGNOSIS — Z681 Body mass index (BMI) 19 or less, adult: Secondary | ICD-10-CM | POA: Diagnosis not present

## 2018-06-30 DIAGNOSIS — C786 Secondary malignant neoplasm of retroperitoneum and peritoneum: Secondary | ICD-10-CM | POA: Diagnosis not present

## 2018-06-30 DIAGNOSIS — L89152 Pressure ulcer of sacral region, stage 2: Secondary | ICD-10-CM | POA: Diagnosis not present

## 2018-06-30 DIAGNOSIS — I119 Hypertensive heart disease without heart failure: Secondary | ICD-10-CM | POA: Diagnosis not present

## 2018-06-30 DIAGNOSIS — I252 Old myocardial infarction: Secondary | ICD-10-CM | POA: Diagnosis not present

## 2018-06-30 DIAGNOSIS — E43 Unspecified severe protein-calorie malnutrition: Secondary | ICD-10-CM | POA: Diagnosis not present

## 2018-06-30 DIAGNOSIS — Z87891 Personal history of nicotine dependence: Secondary | ICD-10-CM | POA: Diagnosis not present

## 2018-06-30 DIAGNOSIS — Z515 Encounter for palliative care: Secondary | ICD-10-CM | POA: Diagnosis not present

## 2018-06-30 DIAGNOSIS — C169 Malignant neoplasm of stomach, unspecified: Secondary | ICD-10-CM | POA: Diagnosis not present

## 2018-07-01 DIAGNOSIS — E43 Unspecified severe protein-calorie malnutrition: Secondary | ICD-10-CM | POA: Diagnosis not present

## 2018-07-01 DIAGNOSIS — I119 Hypertensive heart disease without heart failure: Secondary | ICD-10-CM | POA: Diagnosis not present

## 2018-07-01 DIAGNOSIS — I252 Old myocardial infarction: Secondary | ICD-10-CM | POA: Diagnosis not present

## 2018-07-01 DIAGNOSIS — C169 Malignant neoplasm of stomach, unspecified: Secondary | ICD-10-CM | POA: Diagnosis not present

## 2018-07-01 DIAGNOSIS — C786 Secondary malignant neoplasm of retroperitoneum and peritoneum: Secondary | ICD-10-CM | POA: Diagnosis not present

## 2018-07-01 DIAGNOSIS — L89152 Pressure ulcer of sacral region, stage 2: Secondary | ICD-10-CM | POA: Diagnosis not present

## 2018-07-31 DEATH — deceased

## 2018-10-07 IMAGING — CT CT CHEST W/O CM
2 of 4 series · 15 of 36 positions shown, 18 images · non-contrast
Comparison: No priors.

CLINICAL DATA: 83-year-old male with newly diagnosed gastric
cancer. Weight loss. Recent history of gastrojejunostomy performed
on 11/12/2017.

EXAM:
CT CHEST WITHOUT CONTRAST
TECHNIQUE: Multidetector CT imaging of the chest was performed following the
standard protocol without IV contrast.

[Series 2: thorax · axial · 0.74mm/px · z∈[-345,-35]mm · 12 of 185 slices shown, 15 images]
[im 15/185  mediastinal]
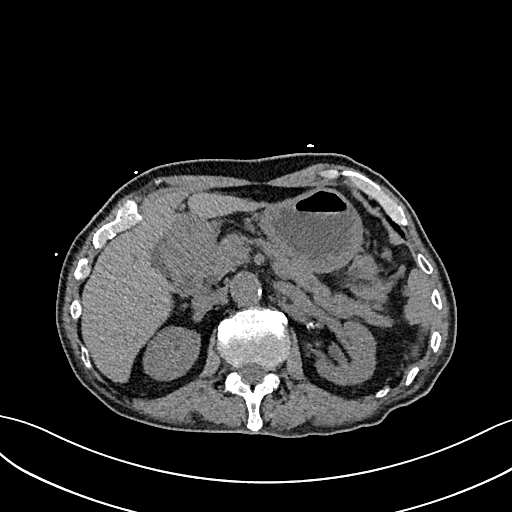
[im 15/185  lung]
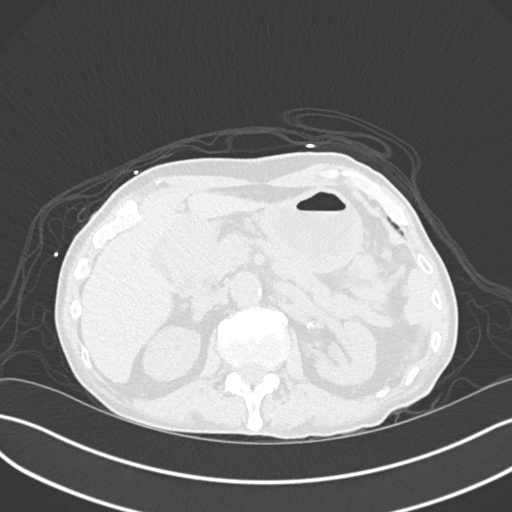
[im 29/185  lung]
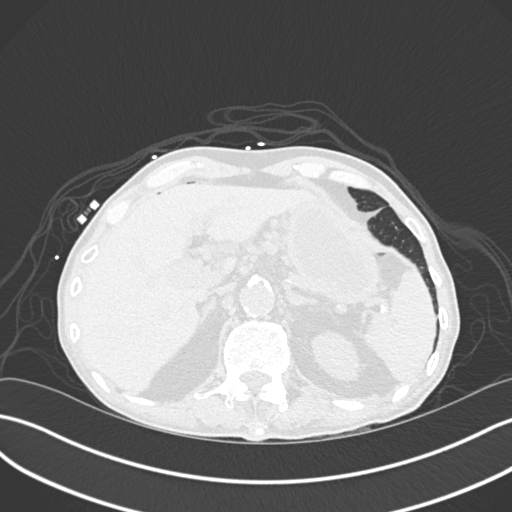
[im 43/185  lung]
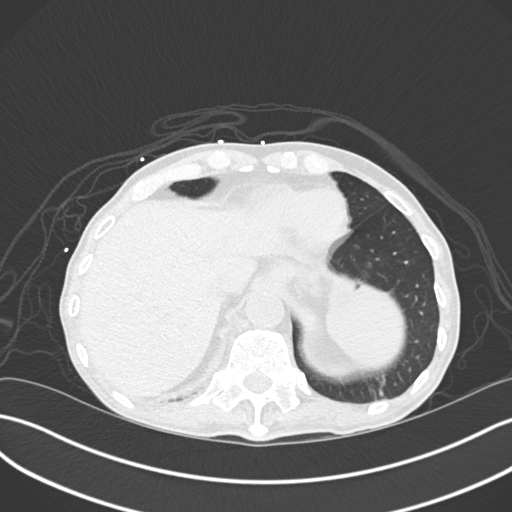
[im 57/185  lung]
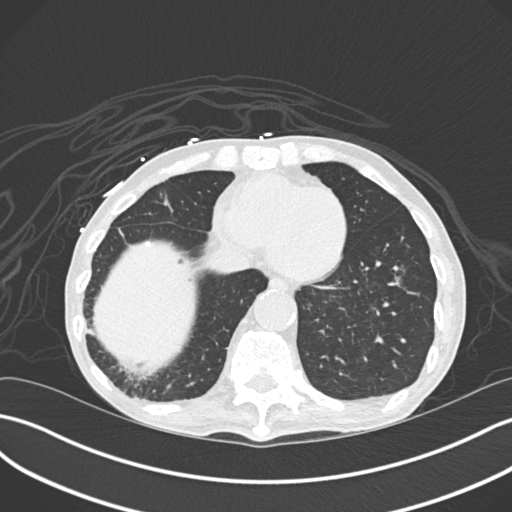
[im 71/185  mediastinal]
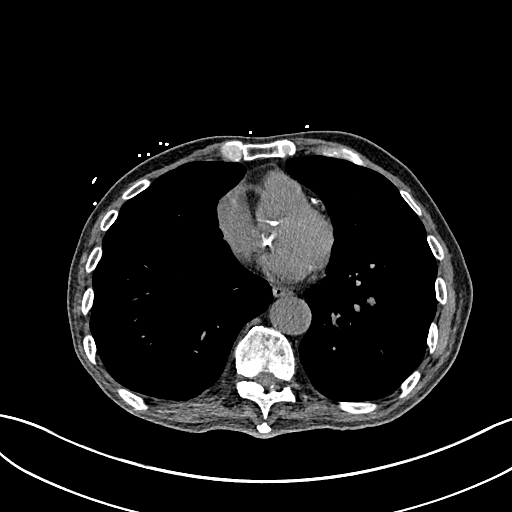
[im 71/185  lung]
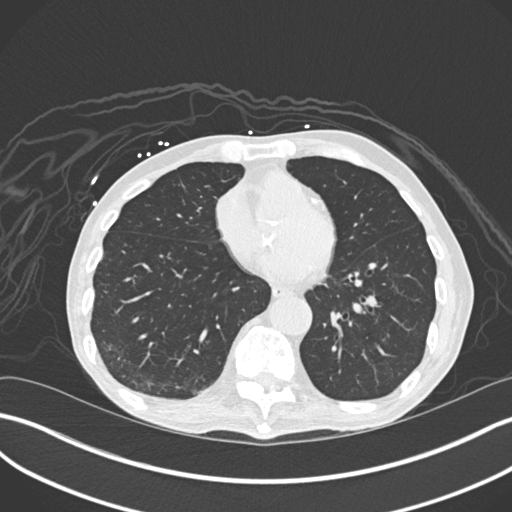
[im 85/185  lung]
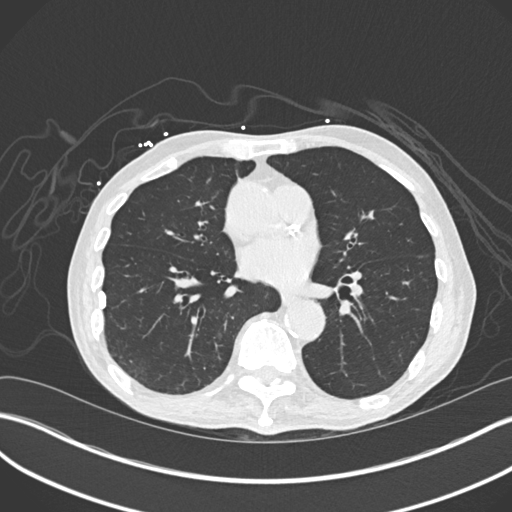
[im 100/185  lung]
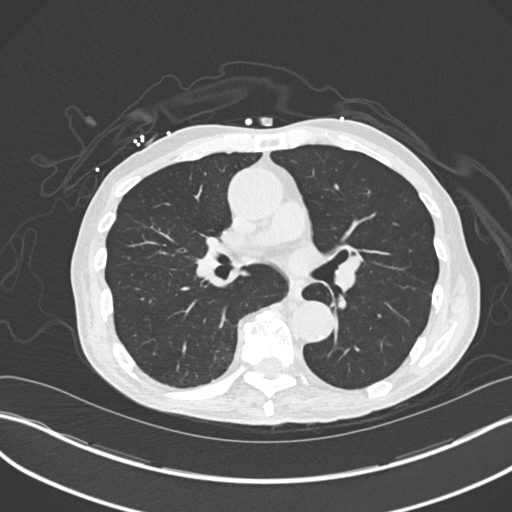
[im 114/185  lung]
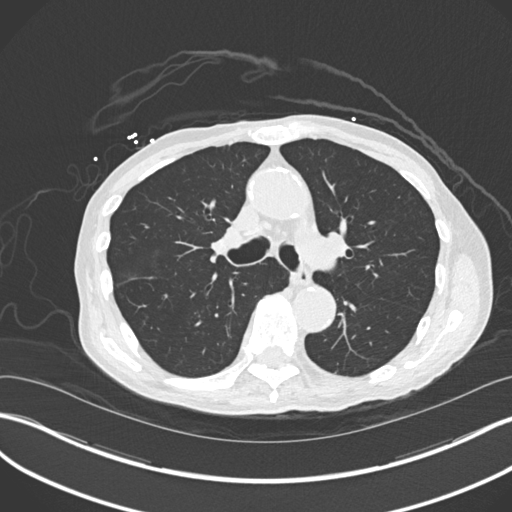
[im 128/185  mediastinal]
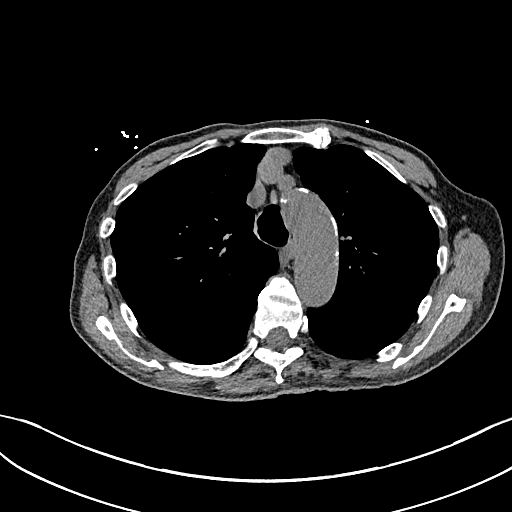
[im 128/185  lung]
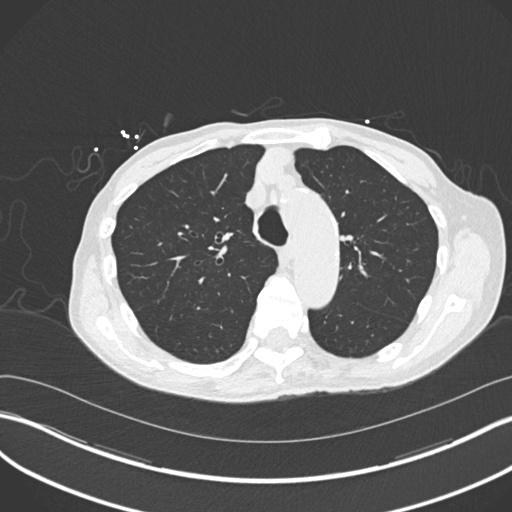
[im 142/185  lung]
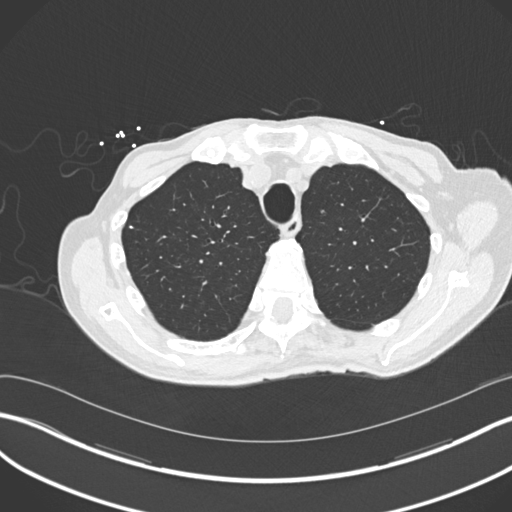
[im 156/185  lung]
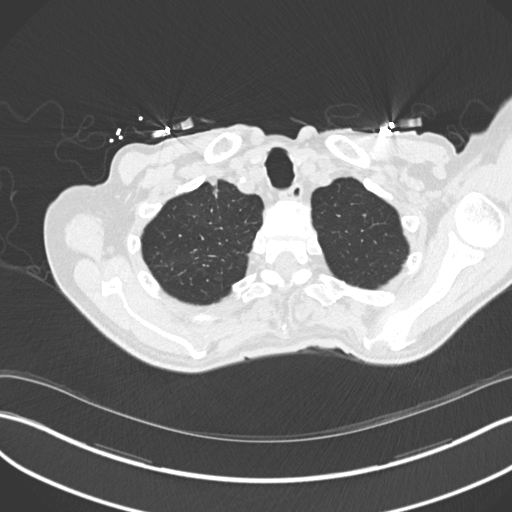
[im 170/185  lung]
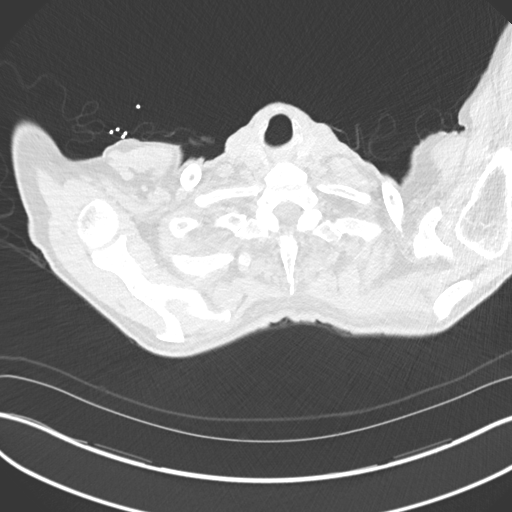

[Series 6: coronal · coronal · 0.67mm/px · 3 of 142 slices shown]
[im 29/142  lung]
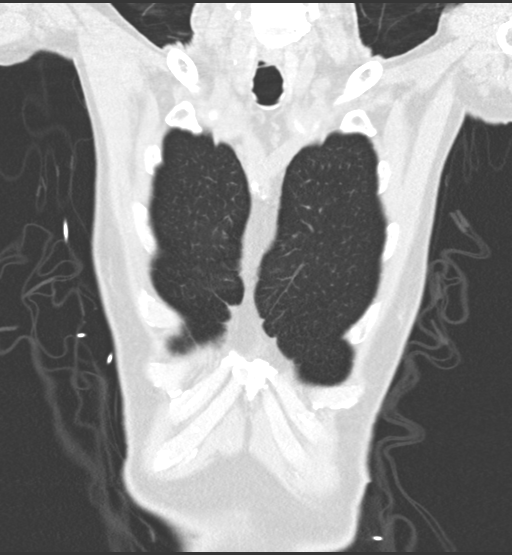
[im 57/142  lung]
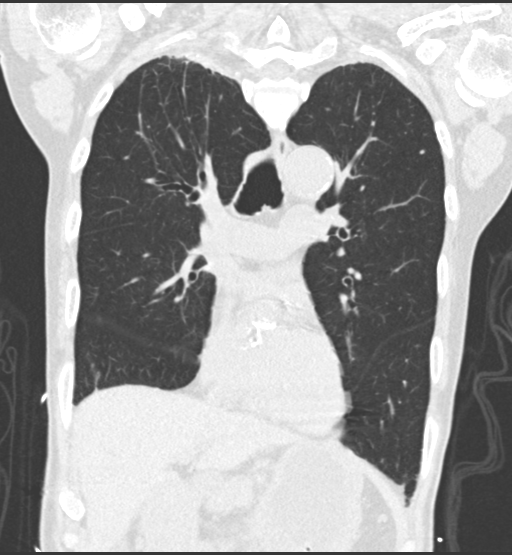
[im 85/142  lung]
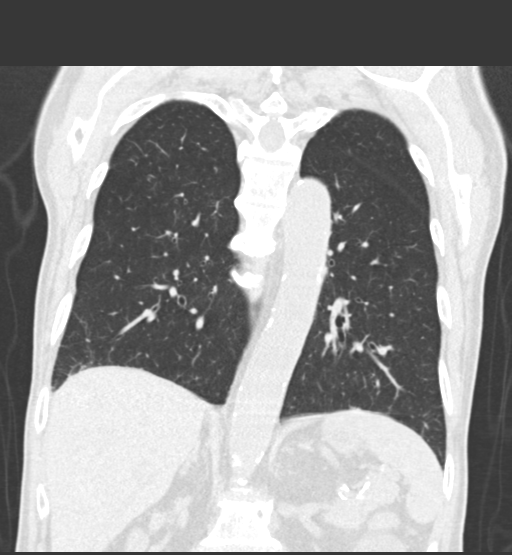

[15 of 36 positions shown; findings below may reference images not displayed]

FINDINGS: Cardiovascular: Heart size is normal. There is no significant
pericardial fluid, thickening or pericardial calcification. There is
aortic atherosclerosis, as well as atherosclerosis of the great
vessels of the mediastinum and the coronary arteries, including
calcified atherosclerotic plaque in the left main, left anterior
descending, left circumflex and right coronary arteries. Severe
calcifications of the aortic valve. Ectasia of the ascending
thoracic aorta (4 cm in diameter).

Mediastinum/Nodes: No pathologically enlarged mediastinal or hilar
lymph nodes. Please note that accurate exclusion of hilar adenopathy
is limited on noncontrast CT scans. Esophagus is unremarkable in
appearance. No axillary lymphadenopathy.

Lungs/Pleura: Several calcified granulomas are noted in the right
lung. Scattered areas of mild septal thickening are noted, most
evident throughout the periphery of the right lower lobe
particularly near the lung base, favored to reflect areas of mild
post infectious/inflammatory scarring. No definite suspicious
appearing pulmonary nodules or masses. No acute consolidative
airspace disease. No pleural effusions. Extensive apical
pleuroparenchymal thickening and architectural distortion is noted,
particularly in the right apex where there is also some pleural
calcification, presumably chronic post infectious or inflammatory
scarring.

Upper Abdomen: Small amount of pneumoperitoneum noted in the upper
abdomen.

Musculoskeletal: There are no aggressive appearing lytic or blastic
lesions noted in the visualized portions of the skeleton.
IMPRESSION: 1. No evidence of metastatic disease to the thorax.
2. Sequela of old granulomatous disease, as above.
3. Aortic atherosclerosis, in addition to left main and 3 vessel
coronary artery disease.
4. Ectasia of the ascending thoracic aorta (4 cm in diameter).
Recommend annual imaging followup by CTA or MRA. This recommendation
follows 8000 ACCF/AHA/AATS/ACR/ASA/SCA/MI/CRUENTUS/POLIN/STUFF Guidelines
for the Diagnosis and Management of Patients with Thoracic Aortic
Disease. Circulation. 8000; 121: [id number].
5. There are calcifications of the aortic valve. Echocardiographic
correlation for evaluation of potential valvular dysfunction may be
warranted if clinically indicated.
6. Small volume of pneumoperitoneum noted in the visualized upper
abdomen, presumably related to recent gastrojejunostomy performed on
11/12/2017. However, clinical correlation is recommended to exclude
any signs are symptoms concerning for perforation.

Aortic Atherosclerosis ([id number].J).

## 2019-01-04 IMAGING — DX DG CHEST 1V PORT
1 series · 2 of 2 positions shown · non-contrast
Comparison: Radiographs 02/14/2018.  CT 02/10/2018

CLINICAL DATA: Acute respiratory failure.

EXAM:
PORTABLE CHEST 1 VIEW

[Series 1: chest ap · 0.14mm/px · 2 of 2 slices shown]
[im 1/2]
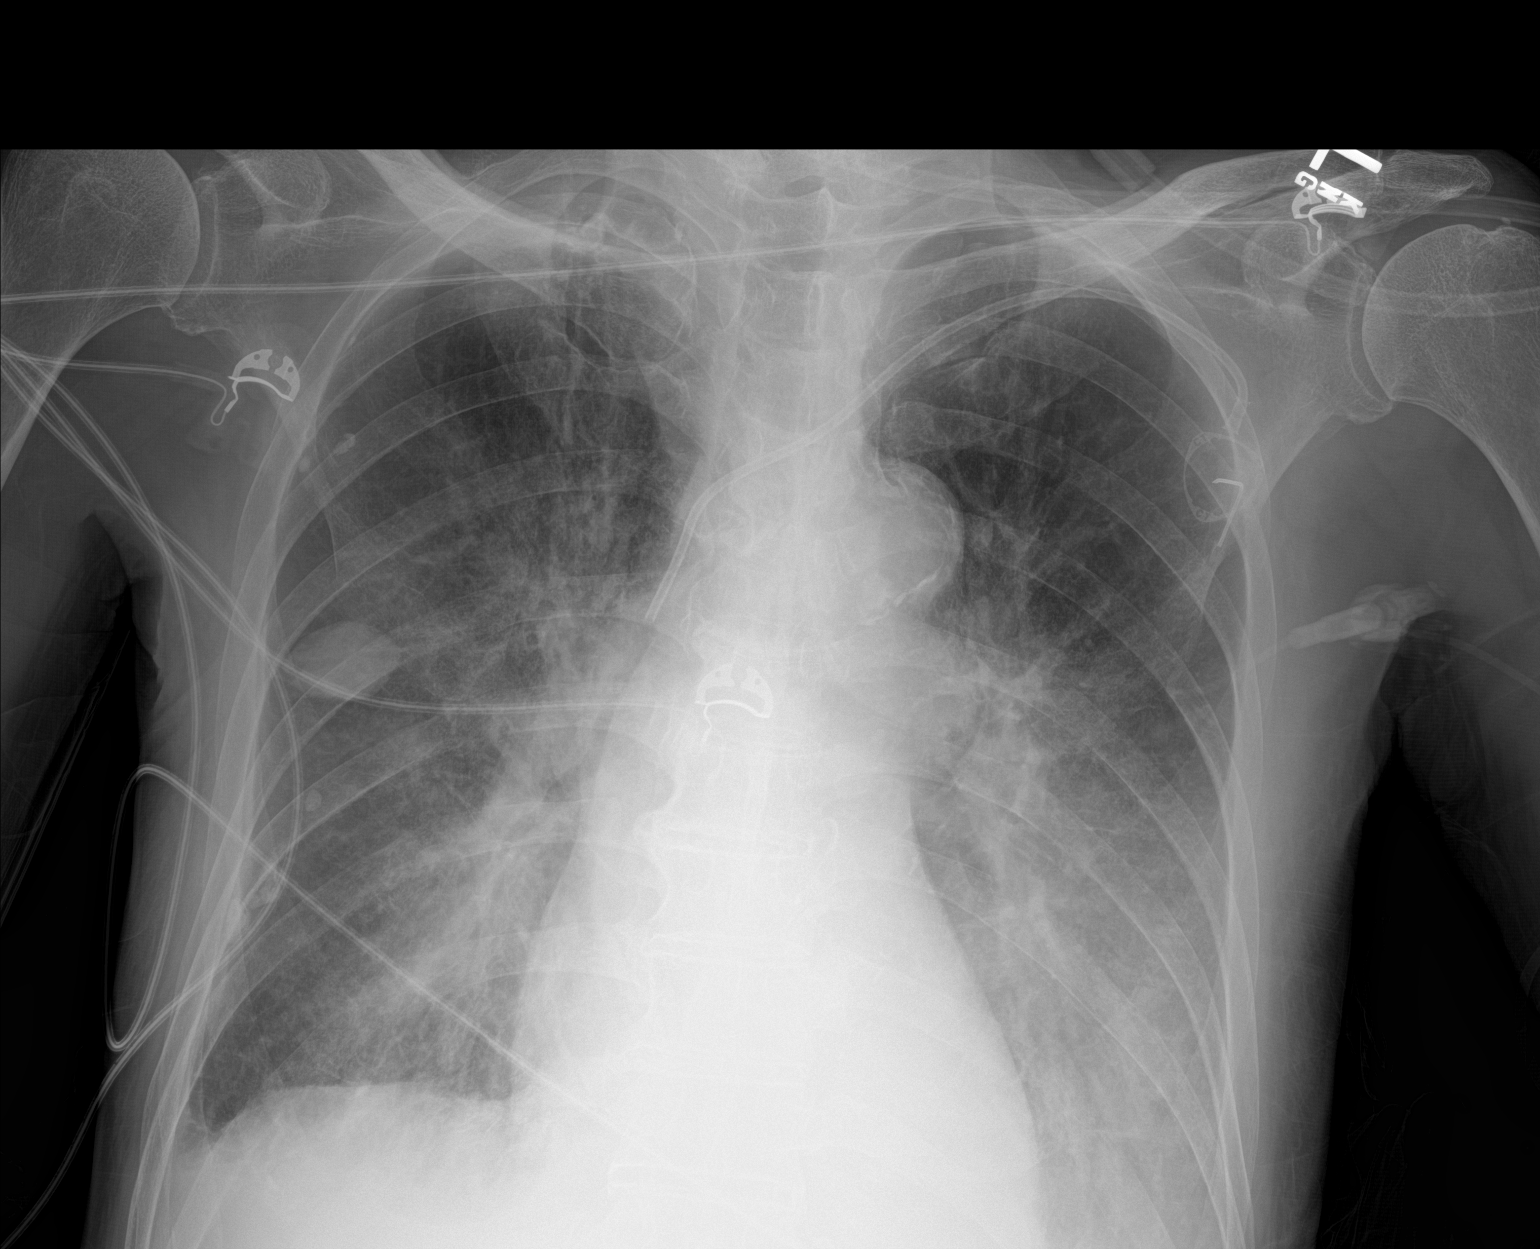
[im 2/2]
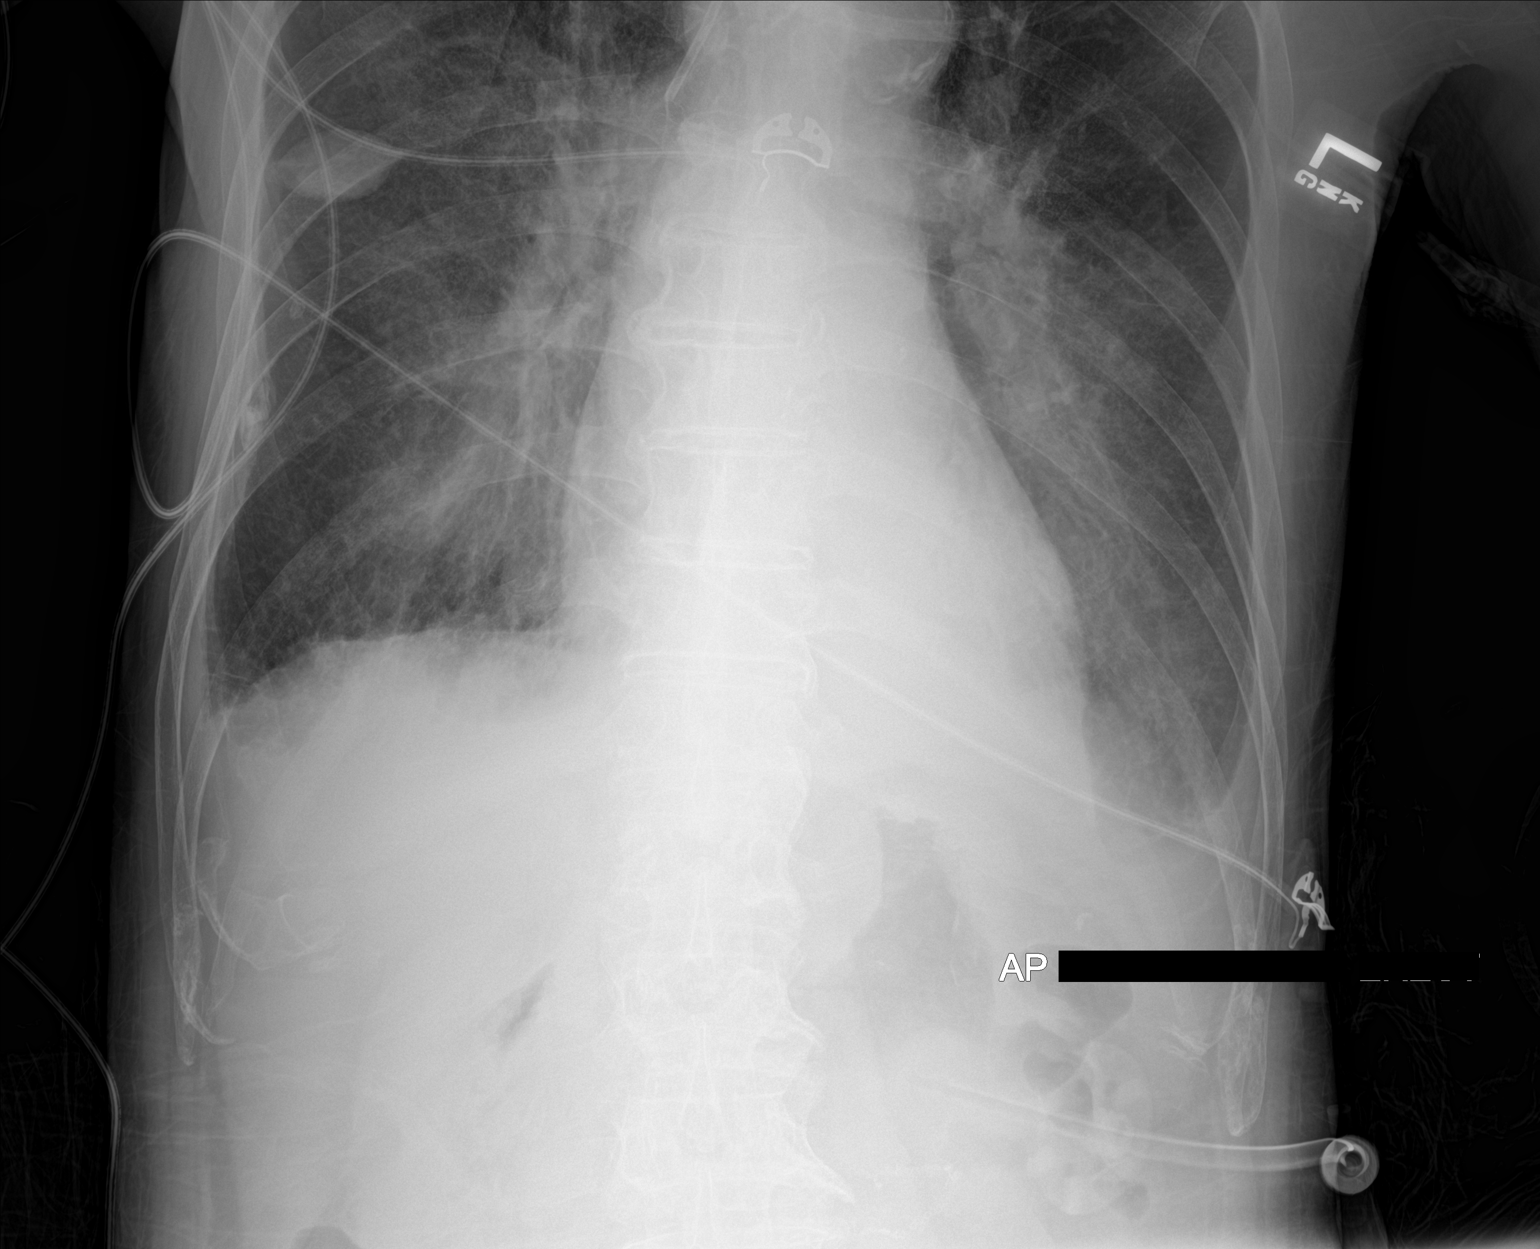

[2 of 2 positions shown; findings below may reference images not displayed]

FINDINGS: Left chest port remains in place. Development of diffuse perihilar
opacities and peribronchial thickening suspicious for pulmonary
edema. Rounded density in the right mid lung corresponds to is
likely fluid in the right minor fissure. Small bilateral pleural
effusions are new. Heart size normal. Aortic atherosclerosis.
Calcified densities in the right hemithorax correspond to pleural
calcifications on CT.
IMPRESSION: Development of CHF with moderate pulmonary edema and small pleural
effusions.

## 2019-01-06 IMAGING — DX DG CHEST 2V
2 series · 2 of 2 positions shown · non-contrast
Comparison: 02/17/2018

CLINICAL DATA: Follow-up pneumonia. Gastric cancer. Altered mental
status. Hypertension and tachycardia.

EXAM:
CHEST  2 VIEW

[chest lat]
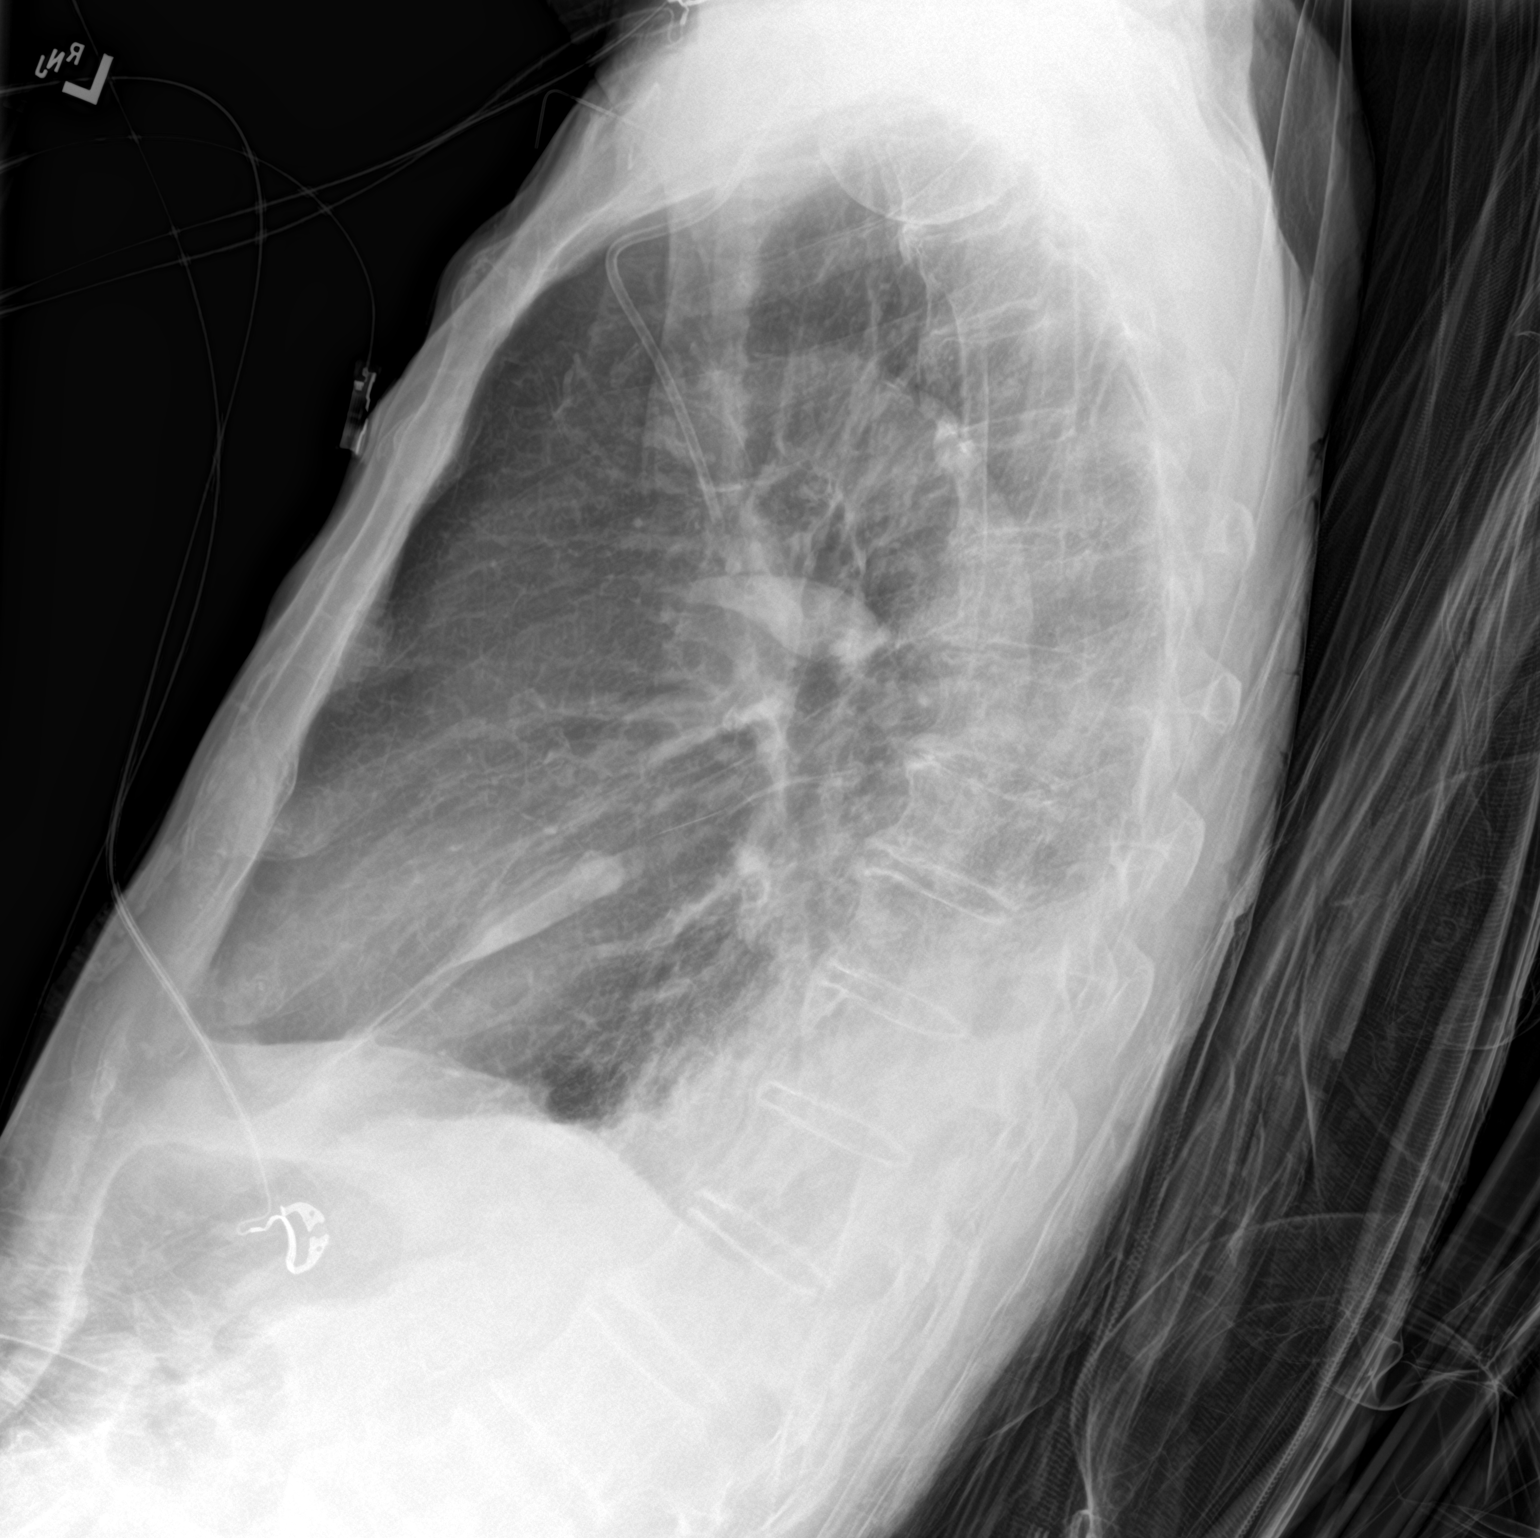

[chest ap]
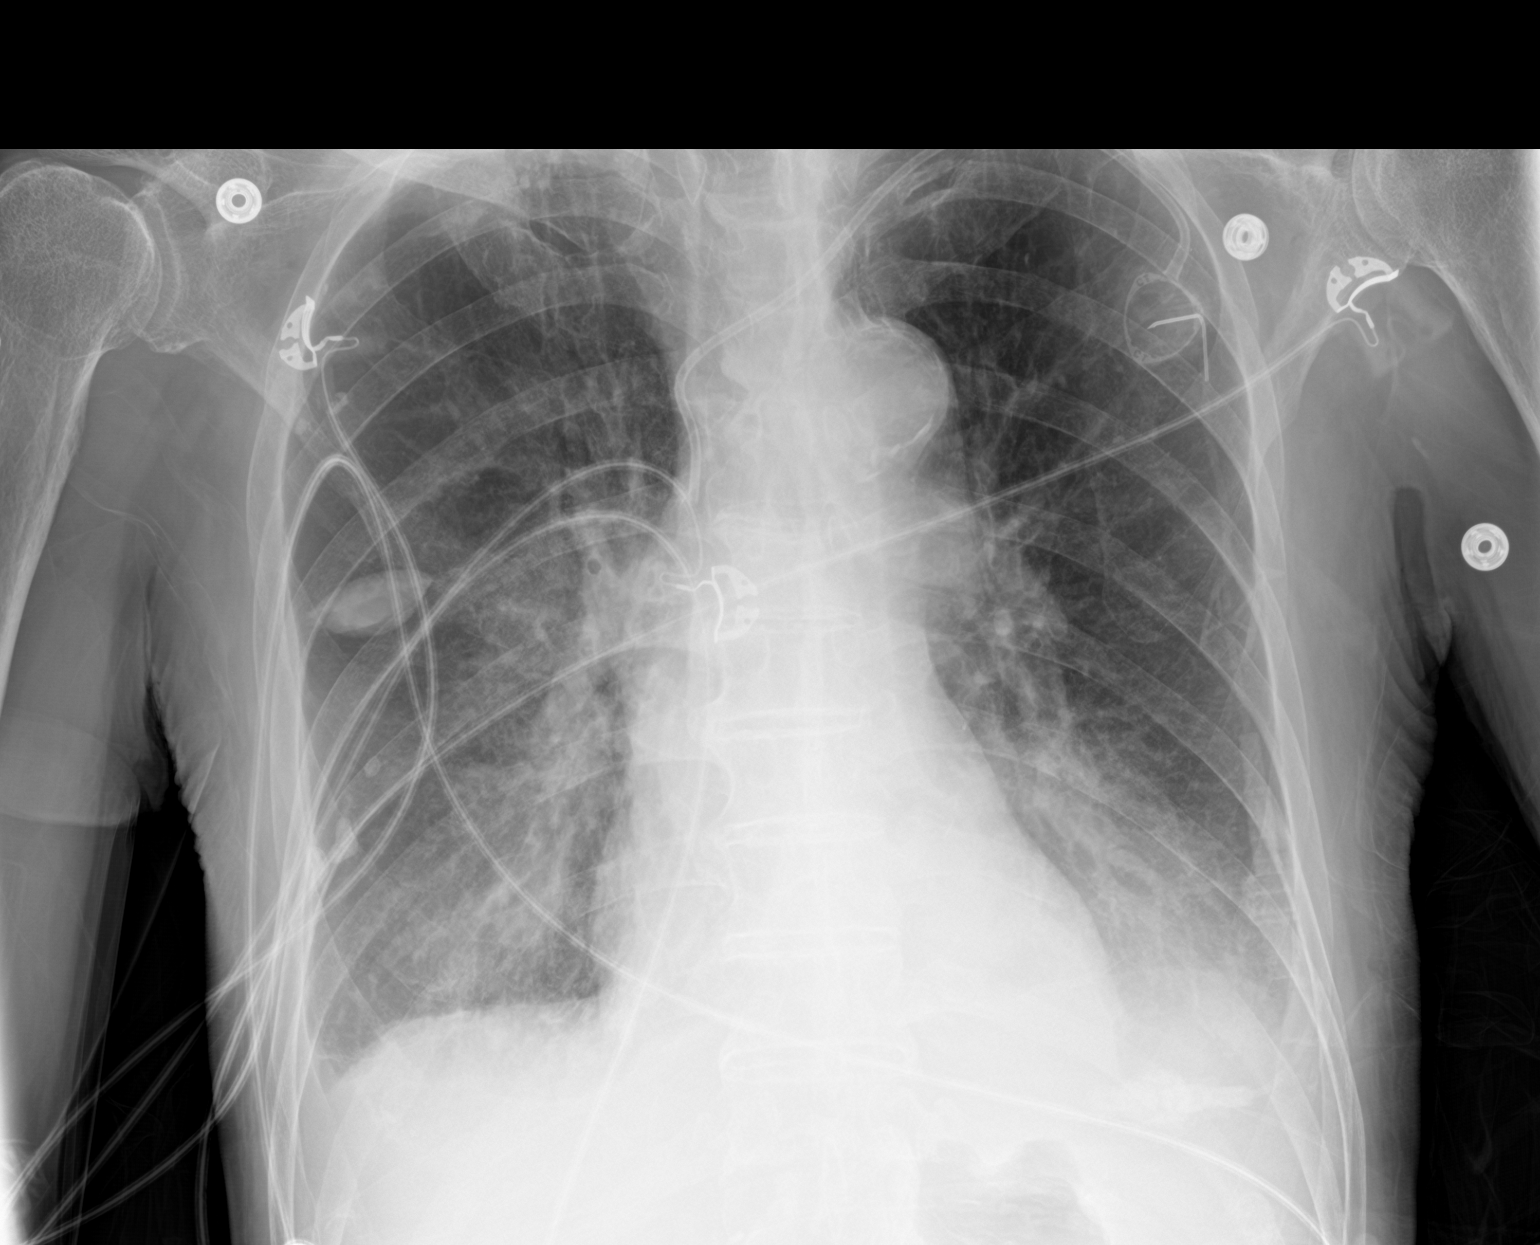

[2 of 2 positions shown; findings below may reference images not displayed]

FINDINGS: Left Port-A-Cath which terminates at the high SVC. Midline trachea.
Normal heart size. Atherosclerosis in the transverse aorta. Small
bilateral pleural effusions, larger on the left. Interstitial and
airspace disease is lower lobe predominant and greater on the left.
IMPRESSION: Slightly improved aeration, at least partially felt to be due to
decreased pulmonary edema. Concurrent infection, especially within
the left lower lobe, cannot be excluded.

Small bilateral pleural effusions.

Aortic Atherosclerosis ([id number]).
